# Patient Record
Sex: Female | Born: 1945 | Race: Black or African American | Hispanic: No | Marital: Single | Smoking: Never smoker
Health system: Southern US, Community
[De-identification: ages and names within clinical notes are randomized; demographics above are authoritative.]

## PROBLEM LIST (undated history)

## (undated) DIAGNOSIS — C9110 Chronic lymphocytic leukemia of B-cell type not having achieved remission: Secondary | ICD-10-CM

## (undated) DIAGNOSIS — I10 Essential (primary) hypertension: Secondary | ICD-10-CM

## (undated) DIAGNOSIS — C801 Malignant (primary) neoplasm, unspecified: Secondary | ICD-10-CM

## (undated) DIAGNOSIS — E119 Type 2 diabetes mellitus without complications: Secondary | ICD-10-CM

## (undated) HISTORY — DX: Chronic lymphocytic leukemia of B-cell type not having achieved remission: C91.10

---

## 2014-04-06 ENCOUNTER — Telehealth: Payer: Self-pay | Admitting: Hematology & Oncology

## 2014-04-06 NOTE — Telephone Encounter (Signed)
Spoke w NEW PATIENT today to remind them of their appointment with Dr. Ennever. Also, advised them to bring all medication bottles and insurance card information. ° °

## 2014-04-07 ENCOUNTER — Other Ambulatory Visit (HOSPITAL_COMMUNITY)
Admission: RE | Admit: 2014-04-07 | Discharge: 2014-04-07 | Disposition: A | Discharge status: Home / Self Care | Payer: Medicare Other | Source: Ambulatory Visit | Attending: Hematology & Oncology | Admitting: Hematology & Oncology

## 2014-04-07 ENCOUNTER — Encounter: Payer: Self-pay | Admitting: Hematology & Oncology

## 2014-04-07 ENCOUNTER — Ambulatory Visit (HOSPITAL_BASED_OUTPATIENT_CLINIC_OR_DEPARTMENT_OTHER): Payer: Medicare Other | Admitting: Hematology & Oncology

## 2014-04-07 ENCOUNTER — Other Ambulatory Visit (HOSPITAL_BASED_OUTPATIENT_CLINIC_OR_DEPARTMENT_OTHER): Payer: Medicare Other | Admitting: Lab

## 2014-04-07 ENCOUNTER — Ambulatory Visit: Payer: Medicare Other

## 2014-04-07 VITALS — BP 188/84 | HR 64 | Temp 98.1°F | Resp 16 | Ht 64.0 in | Wt 242.0 lb

## 2014-04-07 DIAGNOSIS — D72829 Elevated white blood cell count, unspecified: Secondary | ICD-10-CM

## 2014-04-07 DIAGNOSIS — C9110 Chronic lymphocytic leukemia of B-cell type not having achieved remission: Secondary | ICD-10-CM

## 2014-04-07 LAB — CBC WITH DIFFERENTIAL (CANCER CENTER ONLY)
BASO#: 0 10*3/uL (ref 0.0–0.2)
BASO%: 0 % (ref 0.0–2.0)
EOS ABS: 0.3 10*3/uL (ref 0.0–0.5)
EOS%: 0.7 % (ref 0.0–7.0)
HCT: 38.3 % (ref 34.8–46.6)
HGB: 12.5 g/dL (ref 11.6–15.9)
LYMPH#: 37.2 10*3/uL — ABNORMAL HIGH (ref 0.9–3.3)
LYMPH%: 83.1 % — ABNORMAL HIGH (ref 14.0–48.0)
MCH: 23 pg — ABNORMAL LOW (ref 26.0–34.0)
MCHC: 32.6 g/dL (ref 32.0–36.0)
MCV: 70 fL — AB (ref 81–101)
MONO#: 1.8 10*3/uL — AB (ref 0.1–0.9)
MONO%: 4 % (ref 0.0–13.0)
NEUT%: 12.2 % — ABNORMAL LOW (ref 39.6–80.0)
NEUTROS ABS: 5.4 10*3/uL (ref 1.5–6.5)
Platelets: 309 10*3/uL (ref 145–400)
RBC: 5.44 10*6/uL — ABNORMAL HIGH (ref 3.70–5.32)
RDW: 14.8 % (ref 11.1–15.7)
WBC: 44.7 10*3/uL — AB (ref 3.9–10.0)

## 2014-04-07 LAB — TECHNOLOGIST REVIEW CHCC SATELLITE

## 2014-04-07 LAB — CHCC SATELLITE - SMEAR

## 2014-04-07 NOTE — Progress Notes (Signed)
Referral MD  Reason for Referral: Leukocytosis-likely CLL   Chief Complaint  Patient presents with  . NEW PATIENT  : My white cells are high  HPI: Megan Tate is a very kind 68 year old Afro-American female. She is followed at the [facility].  She has diabetes. She's had diabetes for 4 years. She is not on insulin.  She does have some routine blood work done. She did we is due for this in September but her doctor wanted this done sooner. I know that this is how God works. Her white cell count was found to be 45,000. She had a hemoglobin of 11.5 and platelet count was 253,000. Her white cell differential was 10 segs and 8 lymphs. She had electrolytes which were premature unremarkable. Creatinine was 1.2. Calcium 10.6 with an albumin of 3.7.  She's noted some of palpable lymph glands in her neck. They are non-tender.  She's had no fever. She's had some sweats. She's had no weight loss weight gain. She's had no problems infections. She's had no change in bowel or bladder habits. She has never had a colonoscopy. She is not sure when her last mammogram was. I don't usually wants to have these done.  She comes in with her husband. They are both very nice.  She does not smoke. She does not drink. She is retired. She used to be a school bus driver.  Her husband is a Theme park manager.  Overall, her performance status is ECOG 1   No past medical history on file.:  No past surgical history on file.:  Current outpatient prescriptions:cloNIDine (CATAPRES) 0.2 MG tablet, Take 0.2 mg by mouth 2 (two) times daily. , Disp: , Rfl: ;  COLCRYS 0.6 MG tablet, Take 0.6 mg by mouth as needed. , Disp: , Rfl: ;  CRESTOR 10 MG tablet, Take 10 mg by mouth daily. , Disp: , Rfl: ;  dorzolamide-timolol (COSOPT) 22.3-6.8 MG/ML ophthalmic solution, Place 1 drop into both eyes 2 (two) times daily. , Disp: , Rfl:  EDARBI 80 MG TABS, Take by mouth every morning. , Disp: , Rfl: ;  glimepiride (AMARYL) 4 MG tablet, Take  4 mg by mouth daily with breakfast. , Disp: , Rfl: ;  KOMBIGLYZE XR 2.01-999 MG TB24, Take by mouth every morning. , Disp: , Rfl: ;  LEVEMIR FLEXTOUCH 100 UNIT/ML Pen, Inject 10 Units into the skin every morning. , Disp: , Rfl: ;  metoprolol succinate (TOPROL-XL) 100 MG 24 hr tablet, Take 100 mg by mouth daily. , Disp: , Rfl:  naproxen (NAPROSYN) 500 MG tablet, Take 500 mg by mouth 2 (two) times daily with a meal. , Disp: , Rfl: ;  spironolactone (ALDACTONE) 25 MG tablet, Take 25 mg by mouth once. , Disp: , Rfl: ;  sulfamethoxazole-trimethoprim (BACTRIM DS) 800-160 MG per tablet, Take 1 tablet by mouth 2 (two) times daily. , Disp: , Rfl: ;  Vitamin D, Ergocalciferol, (DRISDOL) 50000 UNITS CAPS capsule, Take 50,000 Units by mouth every 7 (seven) days., Disp: , Rfl: :  :  No Known Allergies:  No family history on file.:  History   Social History  . Marital Status: Unknown    Spouse Name: N/A    Number of Children: N/A  . Years of Education: N/A   Occupational History  . Not on file.   Social History Main Topics  . Smoking status: Never Smoker   . Smokeless tobacco: Never Used     Comment: never used tobacco  . Alcohol Use: Not  on file  . Drug Use: Not on file  . Sexual Activity: Not on file   Other Topics Concern  . Not on file   Social History Narrative  . No narrative on file  :  Pertinent items are noted in HPI.  Exam: '@IPVITALS' @  somewhat obese Afro-American female in no obvious distress. Vital signs show a temperature of 98.1. Pulse 64. Blood pressure 180/84. Weight 242 pounds. Head and neck exam shows a normocephalic atraumatic skull . She does have palpable bilateral cervical lymph nodes in the posterior chain. She has palpable supraclavicular lymph nodes. These are nontender. The largest nodes palliative measure 2 cm. Lungs are clear. Cardiac exam regular in rhythm. Abdomen is soft. She has good bowel sounds. There is no fluid wave. There is no palpable liver or spleen  tip. Axillary exam shows bilateral axillary lymph nodes. The largest her in the left axilla measuring about 2-3 cm. Back exam shows no tenderness over the spine ribs or hips. Extremities shows no clubbing cyanosis or edema. She has good range of motion of her joints. She is decent strength in her legs. Skin exam no rashes. Neurological exam is non-focal.   Recent Labs  04/07/14 1437  WBC 44.7*  HGB 12.5  HCT 38.3  PLT 309   No results found for this basename: NA, K, CL, CO2, GLUCOSE, BUN, CREATININE, CALCIUM,  in the last 72 hours  Blood smear review: Normochromic and normocytic red blood cells. She may have a couple target cells. She has no nucleated red cells. There is no rouleau formation. White cells are increased in number. Most are lymphocytes. She has several smudge cells. I see no blasts. She has no hypersegmented polys. I see a couple cleaved lymphocytes.  Pathology: No data     Assessment and Plan: Ms. Derocher is a 68 year old female with leukocytosis. I suspect that she has chronic lymphocytic leukemia. Her blood smear is very characteristic of this. She has lymphadenopathy. 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 Another possibility might be mantle cell lymphoma. Flow cytometry will help with this  I think that we probably will have to consider therapy for her because of the lymphadenopathy.  I did send off her blood for flow cytometry. I suppose that she may have lymphoma. We will see what the flow cytology shows.  I believe that a CT scan is indicated. We will get a scan from the neck down to the pelvis. I suspect that she has diffuse lymphadenopathy. She may have splenomegaly but she is somewhat obese so it is hard to palpate her spleen.  I also suspect that a bone marrow biopsy is going to be needed. I think this will also help guide [place] with therapy.  I will like to try to avoid a lymph node biopsy. We may have to think about this depending on what we  find with our flow cytometry and bone marrow biopsy.  I spent a good hour with she and her husband. I gave her a prayer blanket. We had an excellent prayer session after our meeting.  I laid down my recommendations. I explained to them what I thought she had. I am pretty confident that we are dealing with CLL.  Once we get results back from our studies, then we will get her back and throughout how we can treat this. With the bone marrow biopsy, cytogenetics and FISH will be critical.

## 2014-04-09 LAB — PROTEIN ELECTROPHORESIS, SERUM, WITH REFLEX
ALBUMIN ELP: 59.2 % (ref 55.8–66.1)
Alpha-1-Globulin: 4.1 % (ref 2.9–4.9)
Alpha-2-Globulin: 12.3 % — ABNORMAL HIGH (ref 7.1–11.8)
BETA 2: 3.9 % (ref 3.2–6.5)
BETA GLOBULIN: 6 % (ref 4.7–7.2)
GAMMA GLOBULIN: 14.5 % (ref 11.1–18.8)
Total Protein, Serum Electrophoresis: 6.6 g/dL (ref 6.0–8.3)

## 2014-04-09 LAB — COMPREHENSIVE METABOLIC PANEL
ALBUMIN: 4.3 g/dL (ref 3.5–5.2)
ALT: 9 U/L (ref 0–35)
AST: 16 U/L (ref 0–37)
Alkaline Phosphatase: 69 U/L (ref 39–117)
BUN: 18 mg/dL (ref 6–23)
CO2: 26 meq/L (ref 19–32)
Calcium: 11 mg/dL — ABNORMAL HIGH (ref 8.4–10.5)
Chloride: 103 mEq/L (ref 96–112)
Creatinine, Ser: 1.47 mg/dL — ABNORMAL HIGH (ref 0.50–1.10)
Glucose, Bld: 81 mg/dL (ref 70–99)
Potassium: 4.8 mEq/L (ref 3.5–5.3)
SODIUM: 134 meq/L — AB (ref 135–145)
TOTAL PROTEIN: 6.6 g/dL (ref 6.0–8.3)
Total Bilirubin: 0.3 mg/dL (ref 0.2–1.2)

## 2014-04-09 LAB — RETICULOCYTES (CHCC)
ABS RETIC: 65.4 10*3/uL (ref 19.0–186.0)
RBC.: 5.45 MIL/uL — AB (ref 3.87–5.11)
Retic Ct Pct: 1.2 % (ref 0.4–2.3)

## 2014-04-09 LAB — LACTATE DEHYDROGENASE: LDH: 205 U/L (ref 94–250)

## 2014-04-09 LAB — BETA 2 MICROGLOBULIN, SERUM: Beta-2 Microglobulin: 5.78 mg/L — ABNORMAL HIGH (ref <=2.51)

## 2014-04-09 LAB — DIRECT ANTIGLOBULIN TEST (NOT AT ARMC)
DAT (Complement): NEGATIVE
DAT IgG: NEGATIVE

## 2014-04-13 ENCOUNTER — Telehealth: Payer: Self-pay | Admitting: Hematology & Oncology

## 2014-04-13 ENCOUNTER — Telehealth: Payer: Self-pay | Admitting: *Deleted

## 2014-04-13 NOTE — Telephone Encounter (Signed)
Patient called and also stated to cx Bone Marrow on 04/16/14 due to being out of town.  She stated she would call back to resch both apts.  I  Informed Roxanne Gates of patients statement.  I left Bone marrow 04/16/14 apt due to Md returning back to office tomorrow.

## 2014-04-13 NOTE — Telephone Encounter (Signed)
Patient called and cx 04/14/14 CT apts due to being out of town.  I called Hoyle Sauer in Imaging and cx CT apts and informed the Roxanne Gates of the situation.

## 2014-04-13 NOTE — Telephone Encounter (Signed)
Called to get more information on why the pt cancelled her appointments. No answer. Gave pt a number to call the office back.

## 2014-04-14 ENCOUNTER — Telehealth: Payer: Self-pay | Admitting: Hematology & Oncology

## 2014-04-14 ENCOUNTER — Ambulatory Visit (HOSPITAL_BASED_OUTPATIENT_CLINIC_OR_DEPARTMENT_OTHER): Payer: Medicare Other

## 2014-04-14 LAB — FLOW CYTOMETRY - CHCC SATELLITE

## 2014-04-14 NOTE — Telephone Encounter (Signed)
Left pt message to call and confirm 7-17 BMBX being cx. Scheduling is aware

## 2014-04-15 ENCOUNTER — Telehealth: Payer: Self-pay | Admitting: Hematology & Oncology

## 2014-04-15 ENCOUNTER — Encounter: Payer: Self-pay | Admitting: *Deleted

## 2014-04-15 ENCOUNTER — Other Ambulatory Visit: Payer: Self-pay | Admitting: Hematology & Oncology

## 2014-04-15 DIAGNOSIS — C9110 Chronic lymphocytic leukemia of B-cell type not having achieved remission: Secondary | ICD-10-CM

## 2014-04-15 NOTE — Progress Notes (Signed)
MD aware that pt has cancelled appts and not reuturned calls.

## 2014-04-15 NOTE — Telephone Encounter (Signed)
I left a telephone message for Megan Tate that I was told she canceled her scans in her bone marrow test. I told her that if she did not want to do anything bad I understood this and that this was her prerogative to do this. However, I told that I know that she has chronic lymphocytic leukemia him. This was confirmed by the flow cytometry.  I told her that I really would want to follow her at least with blood work so we can see how her CLL is progressing. Even if she does not do any x-ray test her bone marrow test, we can follow along with just simple lab work to prevent her from ending up in the hospital and then having a lot of complications.  I told her that we can see her after Labor Day and that we would not bother her summer but that we really should see her so that weekend do the proper lab studies.  I told her that we will call her to make an appointment and hopefully she will continue to see [place].  Laurey Arrow

## 2014-04-16 ENCOUNTER — Inpatient Hospital Stay (HOSPITAL_COMMUNITY): Admission: RE | Admit: 2014-04-16 | Payer: Medicare Other | Source: Ambulatory Visit

## 2014-04-16 ENCOUNTER — Telehealth: Payer: Self-pay | Admitting: Hematology & Oncology

## 2014-04-16 ENCOUNTER — Ambulatory Visit (HOSPITAL_COMMUNITY)
Admission: RE | Admit: 2014-04-16 | Discharge: 2014-04-16 | Disposition: A | Discharge status: Home / Self Care | Payer: Medicare Other | Source: Ambulatory Visit | Attending: Hematology & Oncology | Admitting: Hematology & Oncology

## 2014-04-16 NOTE — Telephone Encounter (Signed)
Left pt message with 9-16 appointment

## 2014-04-19 ENCOUNTER — Telehealth: Payer: Self-pay | Admitting: *Deleted

## 2014-04-19 ENCOUNTER — Telehealth: Payer: Self-pay | Admitting: Hematology & Oncology

## 2014-04-19 NOTE — Telephone Encounter (Signed)
Informed pt that the Dr left her a voicemail stating she has Chronic Leukemia and he wants her to come in for followup labs.

## 2014-04-19 NOTE — Telephone Encounter (Signed)
Per Roxanne Gates and MD to sch patient for lab/NP this week.  Apt was sch for 04/22/14.  I called patient and gave her apt date/time.  Patient is aware of apt

## 2014-04-21 ENCOUNTER — Encounter: Payer: Self-pay | Admitting: Hematology & Oncology

## 2014-04-22 ENCOUNTER — Encounter: Payer: Medicare Other | Admitting: Family

## 2014-04-22 ENCOUNTER — Other Ambulatory Visit (HOSPITAL_BASED_OUTPATIENT_CLINIC_OR_DEPARTMENT_OTHER): Payer: Medicare Other | Admitting: Lab

## 2014-04-22 ENCOUNTER — Encounter: Payer: Self-pay | Admitting: Family

## 2014-04-22 VITALS — BP 158/65 | HR 75 | Temp 98.2°F | Resp 14 | Ht 64.0 in | Wt 243.0 lb

## 2014-04-22 DIAGNOSIS — I10 Essential (primary) hypertension: Secondary | ICD-10-CM

## 2014-04-22 DIAGNOSIS — C9110 Chronic lymphocytic leukemia of B-cell type not having achieved remission: Secondary | ICD-10-CM

## 2014-04-22 LAB — CBC WITH DIFFERENTIAL (CANCER CENTER ONLY)
BASO#: 0.1 10*3/uL (ref 0.0–0.2)
BASO%: 0.1 % (ref 0.0–2.0)
EOS%: 0.9 % (ref 0.0–7.0)
Eosinophils Absolute: 0.4 10*3/uL (ref 0.0–0.5)
HCT: 37.5 % (ref 34.8–46.6)
HGB: 12 g/dL (ref 11.6–15.9)
LYMPH#: 38.7 10*3/uL — AB (ref 0.9–3.3)
LYMPH%: 86.3 % — ABNORMAL HIGH (ref 14.0–48.0)
MCH: 22.9 pg — AB (ref 26.0–34.0)
MCHC: 32 g/dL (ref 32.0–36.0)
MCV: 71 fL — ABNORMAL LOW (ref 81–101)
MONO#: 1.6 10*3/uL — ABNORMAL HIGH (ref 0.1–0.9)
MONO%: 3.6 % (ref 0.0–13.0)
NEUT%: 9.1 % — ABNORMAL LOW (ref 39.6–80.0)
NEUTROS ABS: 4.1 10*3/uL (ref 1.5–6.5)
PLATELETS: 204 10*3/uL (ref 145–400)
RBC: 5.25 10*6/uL (ref 3.70–5.32)
RDW: 14.8 % (ref 11.1–15.7)
WBC: 44.9 10*3/uL — ABNORMAL HIGH (ref 3.9–10.0)

## 2014-04-22 LAB — TECHNOLOGIST REVIEW CHCC SATELLITE

## 2014-04-22 LAB — CHCC SATELLITE - SMEAR

## 2014-04-22 MED ORDER — SPIRONOLACTONE 25 MG PO TABS: 25.0000 mg | ORAL_TABLET | Freq: Once | ORAL | Status: DC

## 2014-04-22 NOTE — Progress Notes (Signed)
This encounter was created in error - please disregard.

## 2014-04-23 NOTE — Addendum Note (Signed)
Addended by: Volanda Napoleon on: 04/23/2014 06:52 AM   Modules accepted: Level of Service

## 2014-04-23 NOTE — Progress Notes (Signed)
Hematology and Oncology Follow Up Visit  Megan Tate [id number] [birth year] 68 y.o. 04/23/2014   Principle Diagnosis:   Chronic lymphocytic leukemia-stage B  Current Therapy:    Observation     Interim History:  Ms.  Megan Tate is back for a followup. We first saw her 3 weeks ago. She was therefore scans. She had a cervical lymphadenopathy. She needed to have a bone marrow test done. However, she canceled all these.  I call her when her know what was going on.  The flow cytometry that we did confirm that she has chronic lymphocytic leukemia. I told her that we really need to talk to her about what her options were. She is quite apprehensive. Thanks for, she is a very strong faith. Her husband is a Company secretary.  She has diabetes. I think the diabetes will be a much bigger problem long term than the CLL.  She's had no pain with the lymphadenopathy. She's not noted the lymph nodes to be growing.  She, unfortunately, has a daughter who has recurrent brain cancer. They brought her home from [place]. She now is going to [facility].  She's had no fever. She's had no nausea vomiting. She's had no abdominal pain.  When we first saw her, her beta-2 microglobulin was 5.78. I think this is a good indicator for the burden of disease at that she has. She did not have a monoclonal spike in her serum. She was negative for Coombs assay.  Her appetite has been okay.  Overall, her performance status is ECOG 1.  Medications: Current outpatient prescriptions:cloNIDine (CATAPRES) 0.2 MG tablet, Take 0.2 mg by mouth 2 (two) times daily. , Disp: , Rfl: ;  CRESTOR 10 MG tablet, Take 10 mg by mouth daily. , Disp: , Rfl: ;  dorzolamide-timolol (COSOPT) 22.3-6.8 MG/ML ophthalmic solution, Place 1 drop into both eyes 2 (two) times daily. , Disp: , Rfl: ;  EDARBI 80 MG TABS, Take by mouth every morning. , Disp: , Rfl:  glimepiride (AMARYL) 4 MG tablet, Take 4 mg by mouth daily with breakfast. , Disp: , Rfl: ;   KOMBIGLYZE XR 2.01-999 MG TB24, Take by mouth every morning. , Disp: , Rfl: ;  LEVEMIR FLEXTOUCH 100 UNIT/ML Pen, Inject 10 Units into the skin every morning. , Disp: , Rfl: ;  metoprolol succinate (TOPROL-XL) 100 MG 24 hr tablet, Take 100 mg by mouth daily. , Disp: , Rfl:  spironolactone (ALDACTONE) 25 MG tablet, Take 1 tablet (25 mg total) by mouth once., Disp: 90 tablet, Rfl: 3;  Vitamin D, Ergocalciferol, (DRISDOL) 50000 UNITS CAPS capsule, Take 50,000 Units by mouth every 7 (seven) days., Disp: , Rfl: ;  COLCRYS 0.6 MG tablet, Take 0.6 mg by mouth as needed. , Disp: , Rfl:   Allergies: No Known Allergies  Past Medical History, Surgical history, Social history, and Family History were reviewed and updated.  Review of Systems: As above  Physical Exam:  height is 5\' 4"  (1.626 m) and weight is 243 lb (110.224 kg). Her oral temperature is 98.2 F (36.8 C). Her blood pressure is 158/65 and her pulse is 75. Her respiration is 14.    mildly obese Afro-American female. Head and neck exam shows bilateral cervical lymphadenopathy. She has left supraclavicular lymphadenopathy. The lymph nodes are slightly mobile. They are fleshy in texture. There is no oral lesion. Lungs are clear. Cardiac exam regular rhythm. Abdomen is soft. Has good bowel sounds. There is no fluid wave. There is no palpable liver or  spleen tip. Axillary exam shows no obvious axillary adenopathy. Back exam no tenderness over the spine ribs or hips. Extremities shows no clubbing cyanosis or edema. She's the right portion of her joints. Skin exam no rashes, ecchymosis or petechia. Neurological exam shows no focal neurological deficits.  Lab Results  Component Value Date   WBC 44.9* 04/22/2014   HGB 12.0 04/22/2014   HCT 37.5 04/22/2014   MCV 71* 04/22/2014   PLT 204 04/22/2014     Chemistry      Component Value Date/Time   NA 134* 04/07/2014 1437   K 4.8 04/07/2014 1437   CL 103 04/07/2014 1437   CO2 26 04/07/2014 1437   BUN 18 04/07/2014  1437   CREATININE 1.47* 04/07/2014 1437      Component Value Date/Time   CALCIUM 11.0* 04/07/2014 1437   ALKPHOS 69 04/07/2014 1437   AST 16 04/07/2014 1437   ALT 9 04/07/2014 1437   BILITOT 0.3 04/07/2014 1437         Impression and Plan: Megan Tate is 68 year old African American female. She has CLL. By the lymphadenopathy, I would said she has stage B disease.  I told her and her husband for about 45 minutes. I explained to them what CLL is. I told them that she did not need therapy right now. However, I did feel that we need to get a CT scan to see what type of lymphadenopathy she has in size. She does agree to this.  I told them the criteria that I use to determine if we need to treat. I realize that she does have a daughter that is sick. We will keep this in mind.  We will plan to get her back in a couple months. Again, we can just follow her long without having to do any therapy as long as she is not symptomatic in that her lymph nodes do not grow., and that she does not have changes with her hemoglobin or platelet count.   Volanda Napoleon, MD 7/24/20156:44 AM

## 2014-04-28 ENCOUNTER — Ambulatory Visit (HOSPITAL_BASED_OUTPATIENT_CLINIC_OR_DEPARTMENT_OTHER)
Admission: RE | Admit: 2014-04-28 | Discharge: 2014-04-28 | Disposition: A | Discharge status: Home / Self Care | Payer: Medicare Other | Source: Ambulatory Visit | Attending: Hematology & Oncology | Admitting: Hematology & Oncology

## 2014-04-28 ENCOUNTER — Encounter (HOSPITAL_BASED_OUTPATIENT_CLINIC_OR_DEPARTMENT_OTHER): Payer: Self-pay

## 2014-04-28 DIAGNOSIS — M47814 Spondylosis without myelopathy or radiculopathy, thoracic region: Secondary | ICD-10-CM | POA: Insufficient documentation

## 2014-04-28 DIAGNOSIS — E041 Nontoxic single thyroid nodule: Secondary | ICD-10-CM | POA: Diagnosis not present

## 2014-04-28 DIAGNOSIS — R599 Enlarged lymph nodes, unspecified: Secondary | ICD-10-CM | POA: Diagnosis not present

## 2014-04-28 DIAGNOSIS — C9110 Chronic lymphocytic leukemia of B-cell type not having achieved remission: Secondary | ICD-10-CM

## 2014-04-28 DIAGNOSIS — I2510 Atherosclerotic heart disease of native coronary artery without angina pectoris: Secondary | ICD-10-CM | POA: Diagnosis not present

## 2014-04-28 HISTORY — DX: Malignant (primary) neoplasm, unspecified: C80.1

## 2014-04-28 HISTORY — DX: Type 2 diabetes mellitus without complications: E11.9

## 2014-04-28 HISTORY — DX: Essential (primary) hypertension: I10

## 2014-04-28 MED ORDER — IOHEXOL 300 MG/ML  SOLN
80.0000 mL | Freq: Once | INTRAMUSCULAR | Status: AC | PRN
  Administered 2014-04-28: 80 mL via INTRAVENOUS

## 2014-05-04 ENCOUNTER — Telehealth: Payer: Self-pay | Admitting: *Deleted

## 2014-05-04 NOTE — Telephone Encounter (Addendum)
Message copied by Lenn Sink on Tue May 04, 2014  9:05 AM ------      Message from: Volanda Napoleon      Created: Sun May 02, 2014  8:37 PM       Call - there are enlarged lymph nodes, but nothing that is serious.  Spleen is normal in size!!  pete ------Informed pt that CT showed enlarged lymph nodes, but nothing that is serious. Spleen is normal in size

## 2014-06-16 ENCOUNTER — Encounter: Payer: Self-pay | Admitting: Hematology & Oncology

## 2014-06-16 ENCOUNTER — Other Ambulatory Visit (HOSPITAL_BASED_OUTPATIENT_CLINIC_OR_DEPARTMENT_OTHER): Payer: Medicare Other | Admitting: Lab

## 2014-06-16 ENCOUNTER — Ambulatory Visit (HOSPITAL_BASED_OUTPATIENT_CLINIC_OR_DEPARTMENT_OTHER): Payer: Medicare Other | Admitting: Hematology & Oncology

## 2014-06-16 VITALS — BP 189/77 | HR 65 | Temp 98.2°F | Resp 16 | Ht 64.0 in | Wt 243.0 lb

## 2014-06-16 DIAGNOSIS — C9110 Chronic lymphocytic leukemia of B-cell type not having achieved remission: Secondary | ICD-10-CM

## 2014-06-16 LAB — CBC WITH DIFFERENTIAL (CANCER CENTER ONLY)
BASO#: 0.1 10*3/uL (ref 0.0–0.2)
BASO%: 0.1 % (ref 0.0–2.0)
EOS%: 0.5 % (ref 0.0–7.0)
Eosinophils Absolute: 0.3 10*3/uL (ref 0.0–0.5)
HCT: 37.1 % (ref 34.8–46.6)
HEMOGLOBIN: 11.8 g/dL (ref 11.6–15.9)
LYMPH#: 53.2 10*3/uL — AB (ref 0.9–3.3)
LYMPH%: 88.9 % — ABNORMAL HIGH (ref 14.0–48.0)
MCH: 23.1 pg — ABNORMAL LOW (ref 26.0–34.0)
MCHC: 31.8 g/dL — AB (ref 32.0–36.0)
MCV: 73 fL — ABNORMAL LOW (ref 81–101)
MONO#: 2.7 10*3/uL — AB (ref 0.1–0.9)
MONO%: 4.5 % (ref 0.0–13.0)
NEUT%: 6 % — ABNORMAL LOW (ref 39.6–80.0)
NEUTROS ABS: 3.5 10*3/uL (ref 1.5–6.5)
Platelets: 214 10*3/uL (ref 145–400)
RBC: 5.1 10*6/uL (ref 3.70–5.32)
RDW: 14.8 % (ref 11.1–15.7)
WBC: 59.8 10*3/uL — AB (ref 3.9–10.0)

## 2014-06-16 LAB — CHCC SATELLITE - SMEAR

## 2014-06-16 LAB — TECHNOLOGIST REVIEW CHCC SATELLITE

## 2014-06-17 NOTE — Progress Notes (Signed)
Hematology and Oncology Follow Up Visit  Megan Tate [id number] 15-Oct-1945 68 y.o. 06/17/2014   Principle Diagnosis:  CLL-stage C Current Therapy:    Observation     Interim History:  [name] is back for followup. We'll first saw her back in July, she really was not interested in taking any therapy. She felt well. She actually did not want to do any tests. We were able to talk to her and try to get her to understand why we had to do some tests.  We did go ahead and do a CT scan on her. This, no surprise, showed lymphadenopathy. Her spleen was normal in size. There is no organ involvement that I can see by the CLL.  The flow cytometry on her peripheral blood was consistent with CLL.  She has not noted any lymph node growing. She does have some lymphadenopathy or neck. However, this has not bothered her.  She's had a good appetite. There is no nausea or vomiting. She's had no change in bowel or bladder habits. She's had no rashes. She's had no fever. There's been no bleeding. She's had no weakness.  Her daughter is having a hard time undergoing treatment right now. Her daughter is at [facility]. I think she has sarcoma.  She says that her blood sugars are doing okay.  Currently, her performance status is ECOG 1.  Medications: Current outpatient prescriptions:cloNIDine (CATAPRES) 0.2 MG tablet, Take 0.2 mg by mouth 2 (two) times daily. , Disp: , Rfl: ;  COLCRYS 0.6 MG tablet, Take 0.6 mg by mouth as needed. , Disp: , Rfl: ;  CRESTOR 10 MG tablet, Take 10 mg by mouth daily. , Disp: , Rfl: ;  dorzolamide-timolol (COSOPT) 22.3-6.8 MG/ML ophthalmic solution, Place 1 drop into both eyes 2 (two) times daily. , Disp: , Rfl:  EDARBI 80 MG TABS, Take by mouth every morning. , Disp: , Rfl: ;  glimepiride (AMARYL) 4 MG tablet, Take 4 mg by mouth daily with breakfast. , Disp: , Rfl: ;  KOMBIGLYZE XR 2.01-999 MG TB24, Take by mouth every morning. , Disp: , Rfl: ;  LEVEMIR FLEXTOUCH 100  UNIT/ML Pen, Inject 10 Units into the skin every morning. , Disp: , Rfl: ;  metoprolol succinate (TOPROL-XL) 100 MG 24 hr tablet, Take 100 mg by mouth daily. , Disp: , Rfl:  spironolactone (ALDACTONE) 25 MG tablet, Take 1 tablet (25 mg total) by mouth once., Disp: 90 tablet, Rfl: 3;  Vitamin D, Ergocalciferol, (DRISDOL) 50000 UNITS CAPS capsule, Take 50,000 Units by mouth every 7 (seven) days., Disp: , Rfl:   Allergies: No Known Allergies  Past Medical History, Surgical history, Social history, and Family History were reviewed and updated.  Review of Systems: As above  Physical Exam:  height is 5\' 4"  (1.626 m) and weight is 243 lb (110.224 kg). Her oral temperature is 98.2 F (36.8 C). Her blood pressure is 189/77 and her pulse is 65. Her respiration is 16.   Well-developed well-nourished African [place] female. Head and neck exam shows lymphadenopathy on neck. She has bilateral cervical lymph nodes. She has a 2 cm left supraclavicular lymph node. Lymph nodes are mobile. They're slightly firm. They're non-tender. There is no ocular or oral lesions. Lungs are clear. Cardiac exam regular rate rhythm with no murmurs rubs or bruits. Abdomen is soft. Has good bowel sounds. There is no fluid wave. There is no palpable liver or spleen tip. Axillary exam shows bilateral axillary lymph nodes. She has no  tenderness to axillary node palpation. Back exam no tenderness over the spine, ribs or hips. Extremities shows no clubbing, cyanosis or edema. She has good strength. Has good range of motion of her joints. Skin exam no rashes, ecchymosis or petechia.  Lab Results  Component Value Date   WBC 59.8* 06/16/2014   HGB 11.8 06/16/2014   HCT 37.1 06/16/2014   MCV 73* 06/16/2014   PLT 214 06/16/2014     Chemistry      Component Value Date/Time   NA 138 06/16/2014 1102   K 4.6 06/16/2014 1102   CL 104 06/16/2014 1102   CO2 28 06/16/2014 1102   BUN 17 06/16/2014 1102   CREATININE 1.22* 06/16/2014 1102        Component Value Date/Time   CALCIUM 10.3 06/16/2014 1102   ALKPHOS 78 06/16/2014 1102   AST 15 06/16/2014 1102   ALT 14 06/16/2014 1102   BILITOT 0.4 06/16/2014 1102         Impression and Plan: Megan Tate is 68 year old female with CLL. She has stage C disease. She has lymphadenopathy. Her white cell count is going up.  She just does not want to be treated. She still feels good. We will likely need to be started on treatment with in 4-6 months.  We will get her back to see [place] in another 2 months.  Am glad that the CT scan did not show anything unusual. This certainly is encouraging.  I'll have to see about sending off her peripheral blood for cytogenetics. Hopefully I can do that when she comes back.  I spent a good half hour with her today. I explained to her what was going on. I went over her labs and her CAT scans.  I looked at her peripheral blood smear.    Volanda Napoleon, MD 9/17/20157:06 AM

## 2014-06-18 LAB — COMPREHENSIVE METABOLIC PANEL
ALBUMIN: 4 g/dL (ref 3.5–5.2)
ALT: 14 U/L (ref 0–35)
AST: 15 U/L (ref 0–37)
Alkaline Phosphatase: 78 U/L (ref 39–117)
BUN: 17 mg/dL (ref 6–23)
CO2: 28 mEq/L (ref 19–32)
Calcium: 10.3 mg/dL (ref 8.4–10.5)
Chloride: 104 mEq/L (ref 96–112)
Creatinine, Ser: 1.22 mg/dL — ABNORMAL HIGH (ref 0.50–1.10)
Glucose, Bld: 133 mg/dL — ABNORMAL HIGH (ref 70–99)
POTASSIUM: 4.6 meq/L (ref 3.5–5.3)
SODIUM: 138 meq/L (ref 135–145)
TOTAL PROTEIN: 6.3 g/dL (ref 6.0–8.3)
Total Bilirubin: 0.4 mg/dL (ref 0.2–1.2)

## 2014-06-18 LAB — BETA 2 MICROGLOBULIN, SERUM: Beta-2 Microglobulin: 5.86 mg/L — ABNORMAL HIGH (ref <=2.51)

## 2014-06-18 LAB — LACTATE DEHYDROGENASE: LDH: 182 U/L (ref 94–250)

## 2014-08-16 ENCOUNTER — Other Ambulatory Visit (HOSPITAL_COMMUNITY)
Admission: RE | Admit: 2014-08-16 | Discharge: 2014-08-16 | Disposition: A | Discharge status: Home / Self Care | Payer: Medicare Other | Source: Ambulatory Visit | Attending: Hematology & Oncology | Admitting: Hematology & Oncology

## 2014-08-16 ENCOUNTER — Ambulatory Visit (HOSPITAL_BASED_OUTPATIENT_CLINIC_OR_DEPARTMENT_OTHER): Payer: Medicare Other | Admitting: Hematology & Oncology

## 2014-08-16 ENCOUNTER — Encounter: Payer: Self-pay | Admitting: Hematology & Oncology

## 2014-08-16 ENCOUNTER — Other Ambulatory Visit (HOSPITAL_BASED_OUTPATIENT_CLINIC_OR_DEPARTMENT_OTHER): Payer: Medicare Other | Admitting: Lab

## 2014-08-16 VITALS — BP 188/77 | HR 61 | Temp 97.6°F | Resp 16 | Ht 64.0 in | Wt 242.0 lb

## 2014-08-16 DIAGNOSIS — C9110 Chronic lymphocytic leukemia of B-cell type not having achieved remission: Secondary | ICD-10-CM | POA: Diagnosis present

## 2014-08-16 DIAGNOSIS — E119 Type 2 diabetes mellitus without complications: Secondary | ICD-10-CM

## 2014-08-16 DIAGNOSIS — R591 Generalized enlarged lymph nodes: Secondary | ICD-10-CM

## 2014-08-16 HISTORY — DX: Chronic lymphocytic leukemia of B-cell type not having achieved remission: C91.10

## 2014-08-16 LAB — CBC WITH DIFFERENTIAL (CANCER CENTER ONLY)
HCT: 36.9 % (ref 34.8–46.6)
HGB: 11.4 g/dL — ABNORMAL LOW (ref 11.6–15.9)
MCH: 22.6 pg — ABNORMAL LOW (ref 26.0–34.0)
MCHC: 30.9 g/dL — AB (ref 32.0–36.0)
MCV: 73 fL — AB (ref 81–101)
PLATELETS: 196 10*3/uL (ref 145–400)
RBC: 5.05 10*6/uL (ref 3.70–5.32)
RDW: 14.9 % (ref 11.1–15.7)
WBC: 115.9 10*3/uL (ref 3.9–10.0)

## 2014-08-16 LAB — CMP (CANCER CENTER ONLY)
ALK PHOS: 79 U/L (ref 26–84)
ALT: 20 U/L (ref 10–47)
AST: 20 U/L (ref 11–38)
Albumin: 3.6 g/dL (ref 3.3–5.5)
BUN, Bld: 13 mg/dL (ref 7–22)
CO2: 28 mEq/L (ref 18–33)
Calcium: 10.6 mg/dL — ABNORMAL HIGH (ref 8.0–10.3)
Chloride: 100 mEq/L (ref 98–108)
Creat: 1.1 mg/dl (ref 0.6–1.2)
GLUCOSE: 94 mg/dL (ref 73–118)
Potassium: 3.9 mEq/L (ref 3.3–4.7)
Sodium: 144 mEq/L (ref 128–145)
TOTAL PROTEIN: 6.9 g/dL (ref 6.4–8.1)
Total Bilirubin: 0.5 mg/dl (ref 0.20–1.60)

## 2014-08-16 LAB — MANUAL DIFFERENTIAL (CHCC SATELLITE)
ALC: 104.3 10*3/uL — AB (ref 0.6–2.2)
ANC (CHCC HP manual diff): 10.4 10*3/uL — ABNORMAL HIGH (ref 1.5–6.7)
LYMPH: 90 % — ABNORMAL HIGH (ref 14–48)
MONO: 1 % (ref 0–13)
PLT EST ~~LOC~~: ADEQUATE
SEG: 9 % — ABNORMAL LOW (ref 40–75)

## 2014-08-16 LAB — CHCC SATELLITE - SMEAR

## 2014-08-16 MED ORDER — SULFAMETHOXAZOLE-TRIMETHOPRIM 800-160 MG PO TABS: 1.0000 | ORAL_TABLET | Freq: Every day | ORAL | Status: DC

## 2014-08-16 MED ORDER — FAMCICLOVIR 500 MG PO TABS: 500.0000 mg | ORAL_TABLET | Freq: Every day | ORAL | Status: DC

## 2014-08-16 MED ORDER — LIDOCAINE-PRILOCAINE 2.5-2.5 % EX CREA: TOPICAL_CREAM | CUTANEOUS | Status: DC

## 2014-08-16 MED ORDER — ALLOPURINOL 100 MG PO TABS: 100.0000 mg | ORAL_TABLET | Freq: Every day | ORAL | Status: DC

## 2014-08-16 NOTE — Progress Notes (Signed)
Hematology and Oncology Follow Up Visit  Ahni Bradwell [id number] Sep 03, 1946 68 y.o. 08/16/2014   Principle Diagnosis:   CLL-progressive  Current Therapy:    observation     Interim History:  Ms.  Scripter is back for follow-up. Unfortunately, the lymphadenopathy in her neck appears to be getting worse. She has a little bit more pain in the lymph nodes.She does feel a little bit more tired.  She's had no abdominal pain. Her appetite has been okay. There's been no change in bowel or bladder habits.  She does have diabetes. She is on insulin for this.  She's not had any rashes. She's had no pruritus. She's had no bleeding. There's been no headache.   Overall, her performance status is ECOG 1  Medications: Current outpatient prescriptions: cloNIDine (CATAPRES) 0.2 MG tablet, Take 0.2 mg by mouth 2 (two) times daily. , Disp: , Rfl: ;  COLCRYS 0.6 MG tablet, Take 0.6 mg by mouth as needed. , Disp: , Rfl: ;  CRESTOR 10 MG tablet, Take 10 mg by mouth daily. , Disp: , Rfl: ;  dorzolamide-timolol (COSOPT) 22.3-6.8 MG/ML ophthalmic solution, Place 1 drop into both eyes 2 (two) times daily. , Disp: , Rfl:  EDARBI 80 MG TABS, Take by mouth every morning. , Disp: , Rfl: ;  glimepiride (AMARYL) 4 MG tablet, Take 4 mg by mouth daily with breakfast. , Disp: , Rfl: ;  KOMBIGLYZE XR 2.01-999 MG TB24, Take by mouth every morning. , Disp: , Rfl: ;  LEVEMIR FLEXTOUCH 100 UNIT/ML Pen, Inject 10 Units into the skin every morning. , Disp: , Rfl: ;  metoprolol succinate (TOPROL-XL) 100 MG 24 hr tablet, Take 100 mg by mouth daily. , Disp: , Rfl:  spironolactone (ALDACTONE) 25 MG tablet, Take 1 tablet (25 mg total) by mouth once., Disp: 90 tablet, Rfl: 3;  Vitamin D, Ergocalciferol, (DRISDOL) 50000 UNITS CAPS capsule, Take 50,000 Units by mouth every 7 (seven) days., Disp: , Rfl: ;  allopurinol (ZYLOPRIM) 100 MG tablet, Take 1 tablet (100 mg total) by mouth daily., Disp: 30 tablet, Rfl: 0 famciclovir (FAMVIR) 500 MG  tablet, Take 1 tablet (500 mg total) by mouth daily., Disp: 30 tablet, Rfl: 8;  lidocaine-prilocaine (EMLA) cream, Apply to Port-A-Cath site 1 hour ride to treatment, Disp: 30 g, Rfl: 4;  sulfamethoxazole-trimethoprim (BACTRIM DS,SEPTRA DS) 800-160 MG per tablet, Take 1 tablet by mouth daily., Disp: 30 tablet, Rfl: 6  Allergies: No Known Allergies  Past Medical History, Surgical history, Social history, and Family History were reviewed and updated.  Review of Systems: As above  Physical Exam:  height is '5\' 4"'  (1.626 m) and weight is 242 lb (109.77 kg). Her oral temperature is 97.6 F (36.4 C). Her blood pressure is 188/77 and her pulse is 61. Her respiration is 16.   Moderately obese African-American female. Head and neck exam shows no ocular or oral lesions. She does have bilateral cervical and supraclavicular lymph nodes. These lymph nodes are slightly firm. There are nontender. There is slightly mobile. Lungs are clear. Cardiac exam regular rate andrhythm with no murmurs, rubs or bruits. Axillary exam shows bilateral axillary lymph nodes. Abdomen is soft. She is moderately obese. She has no guarding or rebound tenderness. She has no palpable liver or spleen tip. Back exam shows no tenderness over the spine, ribs or hips. Skin exam shows no rashes, ecchymoses or petechia. Neurological exam is nonfocal.  Lab Results  Component Value Date   WBC 115.9* 08/16/2014   HGB 11.4*  08/16/2014   HCT 36.9 08/16/2014   MCV 73* 08/16/2014   PLT 196 08/16/2014     Chemistry      Component Value Date/Time   NA 144 08/16/2014 0837   NA 138 06/16/2014 1102   K 3.9 08/16/2014 0837   K 4.6 06/16/2014 1102   CL 100 08/16/2014 0837   CL 104 06/16/2014 1102   CO2 28 08/16/2014 0837   CO2 28 06/16/2014 1102   BUN 13 08/16/2014 0837   BUN 17 06/16/2014 1102   CREATININE 1.1 08/16/2014 0837   CREATININE 1.22* 06/16/2014 1102      Component Value Date/Time   CALCIUM 10.6* 08/16/2014 0837   CALCIUM  10.3 06/16/2014 1102   ALKPHOS 79 08/16/2014 0837   ALKPHOS 78 06/16/2014 1102   AST 20 08/16/2014 0837   AST 15 06/16/2014 1102   ALT 20 08/16/2014 0837   ALT 14 06/16/2014 1102   BILITOT 0.50 08/16/2014 0837   BILITOT 0.4 06/16/2014 1102         Impression and Plan: Ms. Rosene is 68 year old African [place] female with CLL. Her white cell count clearly is climbing way to quickly. She is more symptomatic now. Her lymphadenopathy is becoming more of a problem.  I did send off her peripheral blood for flow cytometry and FISH studies for CLL. She really does not want have a bone marrow biopsy done.  I think we have to get started on treatment. I really thought that we would have more time. However, her white cell count definitely is "driving" our decisions.  Her husband was with her. We talked for about 45 minutes about treatment. I really think that we have to get started on treatment. She will need to have a Port-A-Cath in place.  I think she would do quite well with the accommodation of Gazyva/Treanda. I this would be a very active accommodation that she would tolerate well. I think success rate should be over 90%. Again, this might change depending on what her cytogenetics show.  I went over the treatment program. I explained to them how that we do this. I explained the side effects. I did give her prescriptions for Famvir, allopurinol, and Bactrim. I told her to start these next week.  I told her that treatment will make her more fatigued. I don't think it will affect her blood sugars but these certainly might be more elevated. She will have to be more diligent in checking her blood sugars.  We talked about the risk of infection. I suspect that her chemotherapy well knocked out her white cell count pretty quickly and that she might be at high risk for infection. This is why she is on the Famvir and the Bactrim.  She wants to get started after Thanksgiving. I don't think this will be  a problem.  I answered all their questions. She will go ahead and go to the chemotherapy teaching class.  I gave them if patient's sheets about each medicine.  I will plan to get her back about 2 weeks after treatment so we can see how her blood counts look at how she is doing..  I would like to think that we see her back, her cervical with adenopathy will be markedly improved.   Volanda Napoleon, MD 11/16/20156:08 PM

## 2014-08-18 ENCOUNTER — Encounter: Payer: Self-pay | Admitting: *Deleted

## 2014-08-18 ENCOUNTER — Other Ambulatory Visit: Payer: Medicare Other

## 2014-08-18 LAB — PROTEIN ELECTROPHORESIS, SERUM, WITH REFLEX
Albumin ELP: 58.5 % (ref 55.8–66.1)
Alpha-1-Globulin: 4.2 % (ref 2.9–4.9)
Alpha-2-Globulin: 11.4 % (ref 7.1–11.8)
Beta 2: 4.6 % (ref 3.2–6.5)
Beta Globulin: 5.9 % (ref 4.7–7.2)
Gamma Globulin: 15.4 % (ref 11.1–18.8)
TOTAL PROTEIN, SERUM ELECTROPHOR: 6.7 g/dL (ref 6.0–8.3)

## 2014-08-18 LAB — LACTATE DEHYDROGENASE: LDH: 215 U/L (ref 94–250)

## 2014-08-18 NOTE — Progress Notes (Signed)
On 11/17 called Dr. Antonieta Pert office in [facility].  Spoke with nurse Roselyn Reef, requested to check with Dr. Marin Olp on 11/18 to see if patient will be taking po [place] with her  [place].  Requested call back on 11/18 as patient is coming in for a treatment education class on 11/18.

## 2014-08-18 NOTE — Progress Notes (Signed)
Nurse, Roselyn Reef from Dr. Antonieta Pert office called this am to state that patient is not going to be on the [place].

## 2014-08-20 LAB — PERIPH BLD RTN CHROMOSOME - KARYOTYPE + FISH

## 2014-08-23 ENCOUNTER — Encounter: Payer: Self-pay | Admitting: Hematology & Oncology

## 2014-08-23 LAB — FISH, PERIPHERAL BLOOD

## 2014-08-23 LAB — CYTOGENETICS, PERIPHERAL BLOOD

## 2014-08-25 ENCOUNTER — Other Ambulatory Visit: Payer: Self-pay | Admitting: Radiology

## 2014-08-27 ENCOUNTER — Encounter: Payer: Self-pay | Admitting: Hematology & Oncology

## 2014-08-30 ENCOUNTER — Other Ambulatory Visit: Payer: Self-pay | Admitting: Hematology & Oncology

## 2014-08-30 ENCOUNTER — Ambulatory Visit (HOSPITAL_COMMUNITY)
Admission: RE | Admit: 2014-08-30 | Discharge: 2014-08-30 | Disposition: A | Discharge status: Home / Self Care | Payer: Medicare Other | Source: Ambulatory Visit | Attending: Hematology & Oncology | Admitting: Hematology & Oncology

## 2014-08-30 DIAGNOSIS — C9110 Chronic lymphocytic leukemia of B-cell type not having achieved remission: Secondary | ICD-10-CM

## 2014-08-30 DIAGNOSIS — E119 Type 2 diabetes mellitus without complications: Secondary | ICD-10-CM | POA: Diagnosis not present

## 2014-08-30 DIAGNOSIS — I10 Essential (primary) hypertension: Secondary | ICD-10-CM | POA: Diagnosis not present

## 2014-08-30 LAB — PROTIME-INR
INR: 1.09 (ref 0.00–1.49)
PROTHROMBIN TIME: 14.2 s (ref 11.6–15.2)

## 2014-08-30 LAB — CBC WITH DIFFERENTIAL/PLATELET
BASOS PCT: 0 % (ref 0–1)
Basophils Absolute: 0 10*3/uL (ref 0.0–0.1)
EOS PCT: 0 % (ref 0–5)
Eosinophils Absolute: 0 10*3/uL (ref 0.0–0.7)
HEMATOCRIT: 39.4 % (ref 36.0–46.0)
HEMOGLOBIN: 12.3 g/dL (ref 12.0–15.0)
Lymphocytes Relative: 93 % — ABNORMAL HIGH (ref 12–46)
Lymphs Abs: 105.7 10*3/uL — ABNORMAL HIGH (ref 0.7–4.0)
MCH: 22.8 pg — ABNORMAL LOW (ref 26.0–34.0)
MCHC: 31.2 g/dL (ref 30.0–36.0)
MCV: 73 fL — ABNORMAL LOW (ref 78.0–100.0)
Monocytes Absolute: 3.4 10*3/uL — ABNORMAL HIGH (ref 0.1–1.0)
Monocytes Relative: 3 % (ref 3–12)
NEUTROS ABS: 4.5 10*3/uL (ref 1.7–7.7)
NEUTROS PCT: 4 % — AB (ref 43–77)
Platelets: 199 10*3/uL (ref 150–400)
RBC: 5.4 MIL/uL — AB (ref 3.87–5.11)
RDW: 14.9 % (ref 11.5–15.5)
WBC: 113.6 10*3/uL (ref 4.0–10.5)

## 2014-08-30 LAB — GLUCOSE, CAPILLARY: Glucose-Capillary: 141 mg/dL — ABNORMAL HIGH (ref 70–99)

## 2014-08-30 MED ORDER — CEFAZOLIN SODIUM-DEXTROSE 2-3 GM-% IV SOLR
2.0000 g | Freq: Once | INTRAVENOUS | Status: AC
  Administered 2014-08-30: 2 g via INTRAVENOUS

## 2014-08-30 MED ORDER — LIDOCAINE HCL 1 % IJ SOLN
INTRAMUSCULAR | Status: AC
  Filled 2014-08-30: qty 20

## 2014-08-30 MED ORDER — MIDAZOLAM HCL 2 MG/2ML IJ SOLN
INTRAMUSCULAR | Status: AC
  Filled 2014-08-30: qty 4

## 2014-08-30 MED ORDER — HEPARIN SOD (PORK) LOCK FLUSH 100 UNIT/ML IV SOLN
INTRAVENOUS | Status: AC
  Filled 2014-08-30: qty 5

## 2014-08-30 MED ORDER — MIDAZOLAM HCL 2 MG/2ML IJ SOLN
INTRAMUSCULAR | Status: AC | PRN
  Administered 2014-08-30: 2 mg via INTRAVENOUS

## 2014-08-30 MED ORDER — HEPARIN SOD (PORK) LOCK FLUSH 100 UNIT/ML IV SOLN
500.0000 [IU] | Freq: Once | INTRAVENOUS | Status: AC
  Administered 2014-08-30: 500 [IU] via INTRAVENOUS

## 2014-08-30 MED ORDER — FENTANYL CITRATE 0.05 MG/ML IJ SOLN
INTRAMUSCULAR | Status: AC
  Filled 2014-08-30: qty 4

## 2014-08-30 MED ORDER — FENTANYL CITRATE 0.05 MG/ML IJ SOLN
INTRAMUSCULAR | Status: AC | PRN
  Administered 2014-08-30: 100 ug via INTRAVENOUS

## 2014-08-30 MED ORDER — SODIUM CHLORIDE 0.9 % IV SOLN
INTRAVENOUS | Status: DC
  Administered 2014-08-30: 500 mL via INTRAVENOUS

## 2014-08-30 MED ORDER — CEFAZOLIN SODIUM-DEXTROSE 2-3 GM-% IV SOLR
INTRAVENOUS | Status: AC
  Filled 2014-08-30: qty 50

## 2014-08-30 NOTE — Progress Notes (Signed)
CRITICAL VALUE ALERT  Critical value received: WBC 113.6  Date of notification:  08/30/14  Time of notification: 1033  Critical value read back:Yes.    Nurse who received alert:  Beatris Si RN  MD notified (1st page):Pam Aguas Buenas PA radiology was in Short Stay and I verbally informed her of this critical value  Time of first page: 1033 MD notified (2nd page):  Time of second page:  Responding MD:see above Time MD responded:  Jannifer Franklin PA was notified of this result

## 2014-08-30 NOTE — H&P (Signed)
Chief Complaint: Chronic lymphocytic leukemia Worsening white count Enlarging cervical LAN  Referring Physician(s): Ennever,Peter R  History of Present Illness: Megan Tate is a 68 y.o. female  CLL  Very high wbc: 113 today Enlarging cervical lymphadenopathy Scheduled to start chemotherapy Wed 12/2 Scheduled for port a cath placement in IR today   Past Medical History  Diagnosis Date  . Diabetes mellitus without complication   . Cancer CLL  . Hypertension   . CLL (chronic lymphocytic leukemia) 08/16/2014    No past surgical history on file.  Allergies: Tylenol  Medications: Prior to Admission medications   Medication Sig Start Date End Date Taking? Authorizing Provider  allopurinol (ZYLOPRIM) 100 MG tablet Take 1 tablet (100 mg total) by mouth daily. 08/16/14  Yes Volanda Napoleon, MD  cloNIDine (CATAPRES) 0.2 MG tablet Take 0.2 mg by mouth 2 (two) times daily.  02/04/14  Yes Historical Provider, MD  COLCRYS 0.6 MG tablet Take 0.6 mg by mouth as needed.  02/04/14  Yes Historical Provider, MD  CRESTOR 10 MG tablet Take 10 mg by mouth daily.  02/04/14  Yes Historical Provider, MD  dorzolamide-timolol (COSOPT) 22.3-6.8 MG/ML ophthalmic solution Place 1 drop into both eyes 2 (two) times daily.  03/18/14  Yes Historical Provider, MD  EDARBI 80 MG TABS Take by mouth every morning.  02/05/14  Yes Historical Provider, MD  famciclovir (FAMVIR) 500 MG tablet Take 1 tablet (500 mg total) by mouth daily. 08/16/14  Yes Volanda Napoleon, MD  KOMBIGLYZE XR 2.01-999 MG TB24 Take by mouth every morning.  02/16/14  Yes Historical Provider, MD  LEVEMIR FLEXTOUCH 100 UNIT/ML Pen Inject 10 Units into the skin every morning.  03/10/14  Yes Historical Provider, MD  metoprolol succinate (TOPROL-XL) 100 MG 24 hr tablet Take 100 mg by mouth daily.  03/10/14  Yes Historical Provider, MD  spironolactone (ALDACTONE) 25 MG tablet Take 1 tablet (25 mg total) by mouth once. 04/22/14  Yes Volanda Napoleon, MD    sulfamethoxazole-trimethoprim (BACTRIM DS,SEPTRA DS) 800-160 MG per tablet Take 1 tablet by mouth daily. 08/16/14  Yes Volanda Napoleon, MD  Vitamin D, Ergocalciferol, (DRISDOL) 50000 UNITS CAPS capsule Take 50,000 Units by mouth every 7 (seven) days. Every thursday   Yes Historical Provider, MD  lidocaine-prilocaine (EMLA) cream Apply to Port-A-Cath site 1 hour ride to treatment 08/16/14   Volanda Napoleon, MD    No family history on file.  History   Social History  . Marital Status: Single    Spouse Name: N/A    Number of Children: N/A  . Years of Education: N/A   Social History Main Topics  . Smoking status: Never Smoker   . Smokeless tobacco: Never Used     Comment: never used tobacco  . Alcohol Use: Not on file  . Drug Use: Not on file  . Sexual Activity: Not on file   Other Topics Concern  . Not on file   Social History Narrative      Review of Systems: A 12 point ROS discussed and pertinent positives are indicated in the HPI above.  All other systems are negative.  Review of Systems  Constitutional: Positive for fatigue. Negative for activity change and unexpected weight change.  Respiratory: Negative for cough, choking and shortness of breath.   Cardiovascular: Negative for chest pain.  Gastrointestinal: Negative for abdominal pain.  Musculoskeletal: Negative for back pain.  Neurological: Negative for weakness.  Psychiatric/Behavioral: Negative for behavioral problems and confusion.  Vital Signs: BP 188/76 mmHg  Pulse 78  Temp(Src) 98.8 F (37.1 C) (Oral)  Resp 16  Ht 5\' 4"  (1.626 m)  Wt 109.77 kg (242 lb)  BMI 41.52 kg/m2  SpO2 100%  Physical Exam  Constitutional: She is oriented to person, place, and time. She appears well-nourished.  Cardiovascular: Normal rate, regular rhythm and normal heart sounds.   No murmur heard. Pulmonary/Chest: Effort normal and breath sounds normal. She has no wheezes.  Abdominal: Soft. Bowel sounds are normal. There  is no tenderness.  Musculoskeletal: Normal range of motion.  Neurological: She is alert and oriented to person, place, and time.  Skin: Skin is warm and dry.  Psychiatric: She has a normal mood and affect. Her behavior is normal. Judgment and thought content normal.  Nursing note and vitals reviewed.   Imaging: No results found.  Labs:  CBC:  Recent Labs  04/22/14 1349 06/16/14 1102 08/16/14 0837 08/30/14 1013  WBC 44.9* 59.8* 115.9* 113.6*  HGB 12.0 11.8 11.4* 12.3  HCT 37.5 37.1 36.9 39.4  PLT 204 214 196 199    COAGS:  Recent Labs  08/30/14 1013  INR 1.09    BMP:  Recent Labs  04/07/14 1437 06/16/14 1102 08/16/14 0837  NA 134* 138 144  K 4.8 4.6 3.9  CL 103 104 100  CO2 26 28 28   GLUCOSE 81 133* 94  BUN 18 17 13   CALCIUM 11.0* 10.3 10.6*  CREATININE 1.47* 1.22* 1.1    LIVER FUNCTION TESTS:  Recent Labs  04/07/14 1437 06/16/14 1102 08/16/14 0837  BILITOT 0.3 0.4 0.50  AST 16 15 20   ALT 9 14 20   ALKPHOS 69 78 79  PROT 6.6 6.3 6.9  ALBUMIN 4.3 4.0  --     TUMOR MARKERS: No results for input(s): AFPTM, CEA, CA199, CHROMGRNA in the last 8760 hours.  Assessment and Plan:  CLL Enlarging cervical LAN Worsening wbc Wbc 113 today Scheduled to start chemo therapy Wed Now scheduled for [facility] access today Pt aware of procedure benefits and risks and agreeable to proceed Consent signed and in chart  Thank you for this interesting consult.  I greatly enjoyed meeting Audrea Bolte and look forward to participating in their care.     I spent a total of 20 minutes face to face in clinical consultation, greater than 50% of which was counseling/coordinating care for [facility] placement  Signed: Mililani Murthy A 08/30/2014, 11:06 AM

## 2014-08-30 NOTE — Discharge Instructions (Signed)
Implanted Port Home Guide °An implanted port is a type of central line that is placed under the skin. Central lines are used to provide IV access when treatment or nutrition needs to be given through a person's veins. Implanted ports are used for long-term IV access. An implanted port may be placed because:  °· You need IV medicine that would be irritating to the small veins in your hands or arms.   °· You need long-term IV medicines, such as antibiotics.   °· You need IV nutrition for a long period.   °· You need frequent blood draws for lab tests.   °· You need dialysis.   °Implanted ports are usually placed in the chest area, but they can also be placed in the upper arm, the abdomen, or the leg. An implanted port has two main parts:  °· Reservoir. The reservoir is round and will appear as a small, raised area under your skin. The reservoir is the part where a needle is inserted to give medicines or draw blood.   °· Catheter. The catheter is a thin, flexible tube that extends from the reservoir. The catheter is placed into a large vein. Medicine that is inserted into the reservoir goes into the catheter and then into the vein.   °HOW WILL I CARE FOR MY INCISION SITE? °Do not get the incision site wet. Bathe or shower as directed by your health care provider.  °HOW IS MY PORT ACCESSED? °Special steps must be taken to access the port:  °· Before the port is accessed, a numbing cream can be placed on the skin. This helps numb the skin over the port site.   °· Your health care provider uses a sterile technique to access the port. °· Your health care provider must put on a mask and sterile gloves. °· The skin over your port is cleaned carefully with an antiseptic and allowed to dry. °· The port is gently pinched between sterile gloves, and a needle is inserted into the port. °· Only "non-coring" port needles should be used to access the port. Once the port is accessed, a blood return should be checked. This helps  ensure that the port is in the vein and is not clogged.   °· If your port needs to remain accessed for a constant infusion, a clear (transparent) bandage will be placed over the needle site. The bandage and needle will need to be changed every week, or as directed by your health care provider.   °· Keep the bandage covering the needle clean and dry. Do not get it wet. Follow your health care provider's instructions on how to take a shower or bath while the port is accessed.   °· If your port does not need to stay accessed, no bandage is needed over the port.   °WHAT IS FLUSHING? °Flushing helps keep the port from getting clogged. Follow your health care provider's instructions on how and when to flush the port. Ports are usually flushed with saline solution or a medicine called heparin. The need for flushing will depend on how the port is used.  °· If the port is used for intermittent medicines or blood draws, the port will need to be flushed:   °· After medicines have been given.   °· After blood has been drawn.   °· As part of routine maintenance.   °· If a constant infusion is running, the port may not need to be flushed.   °HOW LONG WILL MY PORT STAY IMPLANTED? °The port can stay in for as long as your health care   provider thinks it is needed. When it is time for the port to come out, surgery will be done to remove it. The procedure is similar to the one performed when the port was put in.  °WHEN SHOULD I SEEK IMMEDIATE MEDICAL CARE? °When you have an implanted port, you should seek immediate medical care if:  °· You notice a bad smell coming from the incision site.   °· You have swelling, redness, or drainage at the incision site.   °· You have more swelling or pain at the port site or the surrounding area.   °· You have a fever that is not controlled with medicine. °Document Released: 09/17/2005 Document Revised: 07/08/2013 Document Reviewed: 05/25/2013 °ExitCare® Patient Information ©2015 [employer]. This  information is not intended to replace advice given to you by your health care provider. Make sure you discuss any questions you have with your health care provider. ° °Implanted Port Insertion, Care After °Refer to this sheet in the next few weeks. These instructions provide you with information on caring for yourself after your procedure. Your health care provider may also give you more specific instructions. Your treatment has been planned according to current medical practices, but problems sometimes occur. Call your health care provider if you have any problems or questions after your procedure. °WHAT TO EXPECT AFTER THE PROCEDURE °After your procedure, it is typical to have the following:  °· Discomfort at the port insertion site. Ice packs to the area will help. °· Bruising on the skin over the port. This will subside in 3-4 days. °HOME CARE INSTRUCTIONS °· After your port is placed, you will get a manufacturer's information card. The card has information about your port. Keep this card with you at all times.   °· Know what kind of port you have. There are many types of ports available.   °· Wear a medical alert bracelet in case of an emergency. This can help alert health care workers that you have a port.   °· The port can stay in for as long as your health care provider believes it is necessary.   °· A home health care nurse may give medicines and take care of the port.   °· You or a family member can get special training and directions for giving medicine and taking care of the port at home.   °SEEK MEDICAL CARE IF:  °· Your port does not flush or you are unable to get a blood return.   °· You have a fever or chills. °SEEK IMMEDIATE MEDICAL CARE IF: °· You have new fluid or pus coming from your incision.   °· You notice a bad smell coming from your incision site.   °· You have swelling, pain, or more redness at the incision or port site.   °· You have chest pain or shortness of breath. °Document Released:  07/08/2013 Document Revised: 09/22/2013 Document Reviewed: 07/08/2013 °Exit[employer]® Patient Information ©2015 [employer]. This information is not intended to replace advice given to you by your health care provider. Make sure you discuss any questions you have with your health care provider. ° °Conscious Sedation, Adult, Care After °Refer to this sheet in the next few weeks. These instructions provide you with information on caring for yourself after your procedure. Your health care provider may also give you more specific instructions. Your treatment has been planned according to current medical practices, but problems sometimes occur. Call your health care provider if you have any problems or questions after your procedure. °WHAT TO EXPECT AFTER THE PROCEDURE  °After   your procedure: °· You may feel sleepy, clumsy, and have poor balance for several hours. °· Vomiting may occur if you eat too soon after the procedure. °HOME CARE INSTRUCTIONS °· Do not participate in any activities where you could become injured for at least 24 hours. Do not: °¨ Drive. °¨ Swim. °¨ Ride a bicycle. °¨ Operate heavy machinery. °¨ Cook. °¨ Use power tools. °¨ Climb ladders. °¨ Work from a high place. °· Do not make important decisions or sign legal documents until you are improved. °· If you vomit, drink water, juice, or soup when you can drink without vomiting. Make sure you have little or no nausea before eating solid foods. °· Only take over-the-counter or prescription medicines for pain, discomfort, or fever as directed by your health care provider. °· Make sure you and your family fully understand everything about the medicines given to you, including what side effects may occur. °· You should not drink alcohol, take sleeping pills, or take medicines that cause drowsiness for at least 24 hours. °· If you smoke, do not smoke without supervision. °· If you are feeling better, you may resume normal activities 24 hours after you were  sedated. °· Keep all appointments with your health care provider. °SEEK MEDICAL CARE IF: °· Your skin is pale or bluish in color. °· You continue to feel nauseous or vomit. °· Your pain is getting worse and is not helped by medicine. °· You have bleeding or swelling. °· You are still sleepy or feeling clumsy after 24 hours. °SEEK IMMEDIATE MEDICAL CARE IF: °· You develop a rash. °· You have difficulty breathing. °· You develop any type of allergic problem. °· You have a fever. °MAKE SURE YOU: °· Understand these instructions. °· Will watch your condition. °· Will get help right away if you are not doing well or get worse. °Document Released: 07/08/2013 Document Reviewed: 07/08/2013 °Exit[employer]® Patient Information ©2015 [employer]. This information is not intended to replace advice given to you by your health care provider. Make sure you discuss any questions you have with your health care provider. ° ° ° ° °

## 2014-08-30 NOTE — Procedures (Signed)
R IJ Port cathter placement with US and fluoroscopy No complication No blood loss. See complete dictation in [facility] PACS.  

## 2014-09-01 ENCOUNTER — Other Ambulatory Visit (HOSPITAL_BASED_OUTPATIENT_CLINIC_OR_DEPARTMENT_OTHER): Payer: Medicare Other | Admitting: Lab

## 2014-09-01 ENCOUNTER — Encounter: Payer: Self-pay | Admitting: *Deleted

## 2014-09-01 ENCOUNTER — Telehealth: Payer: Self-pay | Admitting: *Deleted

## 2014-09-01 ENCOUNTER — Ambulatory Visit (HOSPITAL_BASED_OUTPATIENT_CLINIC_OR_DEPARTMENT_OTHER): Payer: Medicare Other

## 2014-09-01 VITALS — BP 130/106 | HR 105 | Temp 98.2°F | Resp 20

## 2014-09-01 DIAGNOSIS — C9110 Chronic lymphocytic leukemia of B-cell type not having achieved remission: Secondary | ICD-10-CM

## 2014-09-01 DIAGNOSIS — Z5111 Encounter for antineoplastic chemotherapy: Secondary | ICD-10-CM

## 2014-09-01 DIAGNOSIS — Z5112 Encounter for antineoplastic immunotherapy: Secondary | ICD-10-CM

## 2014-09-01 LAB — TECHNOLOGIST REVIEW CHCC SATELLITE

## 2014-09-01 LAB — CBC WITH DIFFERENTIAL (CANCER CENTER ONLY)
BASO#: 0.1 10*3/uL (ref 0.0–0.2)
BASO%: 0.1 % (ref 0.0–2.0)
EOS ABS: 0.4 10*3/uL (ref 0.0–0.5)
EOS%: 0.3 % (ref 0.0–7.0)
HCT: 35.8 % (ref 34.8–46.6)
HEMOGLOBIN: 11.6 g/dL (ref 11.6–15.9)
LYMPH#: 120.7 10*3/uL — ABNORMAL HIGH (ref 0.9–3.3)
LYMPH%: 92.3 % — ABNORMAL HIGH (ref 14.0–48.0)
MCH: 23 pg — ABNORMAL LOW (ref 26.0–34.0)
MCHC: 32.4 g/dL (ref 32.0–36.0)
MCV: 71 fL — ABNORMAL LOW (ref 81–101)
MONO#: 4.6 10*3/uL — AB (ref 0.1–0.9)
MONO%: 3.5 % (ref 0.0–13.0)
NEUT%: 3.8 % — ABNORMAL LOW (ref 39.6–80.0)
NEUTROS ABS: 5 10*3/uL (ref 1.5–6.5)
Platelets: 198 10*3/uL (ref 145–400)
RBC: 5.05 10*6/uL (ref 3.70–5.32)
RDW: 14.9 % (ref 11.1–15.7)
WBC: 130.8 10*3/uL — AB (ref 3.9–10.0)

## 2014-09-01 LAB — HEPATITIS PANEL, ACUTE
HCV Ab: NEGATIVE
HEP A IGM: NONREACTIVE
HEP B C IGM: NONREACTIVE
HEP B S AG: NEGATIVE

## 2014-09-01 LAB — CMP (CANCER CENTER ONLY)
ALT: 13 U/L (ref 10–47)
AST: 23 U/L (ref 11–38)
Albumin: 4 g/dL (ref 3.3–5.5)
Alkaline Phosphatase: 107 U/L — ABNORMAL HIGH (ref 26–84)
BILIRUBIN TOTAL: 0.5 mg/dL (ref 0.20–1.60)
BUN, Bld: 15 mg/dL (ref 7–22)
CO2: 26 mEq/L (ref 18–33)
Calcium: 10.9 mg/dL — ABNORMAL HIGH (ref 8.0–10.3)
Chloride: 103 mEq/L (ref 98–108)
Creat: 1.4 mg/dl — ABNORMAL HIGH (ref 0.6–1.2)
GLUCOSE: 168 mg/dL — AB (ref 73–118)
Potassium: 4.8 mEq/L — ABNORMAL HIGH (ref 3.3–4.7)
SODIUM: 143 meq/L (ref 128–145)
Total Protein: 7.3 g/dL (ref 6.4–8.1)

## 2014-09-01 MED ORDER — ONDANSETRON HCL 8 MG PO TABS: 8.0000 mg | ORAL_TABLET | Freq: Two times a day (BID) | ORAL | Status: DC

## 2014-09-01 MED ORDER — DEXAMETHASONE SODIUM PHOSPHATE 20 MG/5ML IJ SOLN
INTRAMUSCULAR | Status: AC
  Filled 2014-09-01: qty 5

## 2014-09-01 MED ORDER — SODIUM CHLORIDE 0.9 % IV SOLN
Freq: Once | INTRAVENOUS | Status: AC
  Administered 2014-09-01: 09:00:00 via INTRAVENOUS

## 2014-09-01 MED ORDER — DIPHENHYDRAMINE HCL 50 MG/ML IJ SOLN
INTRAMUSCULAR | Status: AC
  Filled 2014-09-01: qty 1

## 2014-09-01 MED ORDER — HEPARIN SOD (PORK) LOCK FLUSH 100 UNIT/ML IV SOLN
500.0000 [IU] | Freq: Once | INTRAVENOUS | Status: AC | PRN
  Administered 2014-09-01: 500 [IU]
  Filled 2014-09-01: qty 5

## 2014-09-01 MED ORDER — PROCHLORPERAZINE MALEATE 10 MG PO TABS: 10.0000 mg | ORAL_TABLET | Freq: Four times a day (QID) | ORAL | Status: DC | PRN

## 2014-09-01 MED ORDER — FAMOTIDINE IN NACL 20-0.9 MG/50ML-% IV SOLN
20.0000 mg | Freq: Two times a day (BID) | INTRAVENOUS | Status: DC
  Administered 2014-09-01: 20 mg via INTRAVENOUS

## 2014-09-01 MED ORDER — DEXAMETHASONE SODIUM PHOSPHATE 10 MG/ML IJ SOLN: 10.0000 mg | Freq: Once | INTRAMUSCULAR | Status: DC

## 2014-09-01 MED ORDER — DIPHENHYDRAMINE HCL 50 MG/ML IJ SOLN
50.0000 mg | Freq: Once | INTRAMUSCULAR | Status: AC
  Administered 2014-09-01: 50 mg via INTRAVENOUS

## 2014-09-01 MED ORDER — SODIUM CHLORIDE 0.9 % IV SOLN
100.0000 mg/m2 | Freq: Once | INTRAVENOUS | Status: AC
  Administered 2014-09-01: 225 mg via INTRAVENOUS
  Filled 2014-09-01: qty 2.5

## 2014-09-01 MED ORDER — DEXAMETHASONE SODIUM PHOSPHATE 20 MG/5ML IJ SOLN
20.0000 mg | Freq: Once | INTRAMUSCULAR | Status: AC
  Administered 2014-09-01: 20 mg via INTRAVENOUS

## 2014-09-01 MED ORDER — SODIUM CHLORIDE 0.9 % IJ SOLN
10.0000 mL | INTRAMUSCULAR | Status: DC | PRN
  Administered 2014-09-01: 10 mL
  Filled 2014-09-01: qty 10

## 2014-09-01 MED ORDER — SODIUM CHLORIDE 0.9 % IV SOLN
100.0000 mg | Freq: Once | INTRAVENOUS | Status: AC
  Administered 2014-09-01: 100 mg via INTRAVENOUS
  Filled 2014-09-01: qty 4

## 2014-09-01 MED ORDER — IBUPROFEN 200 MG PO TABS
400.0000 mg | ORAL_TABLET | Freq: Once | ORAL | Status: AC
  Administered 2014-09-01: 400 mg via ORAL

## 2014-09-01 MED ORDER — MEPERIDINE HCL 25 MG/ML IJ SOLN
INTRAMUSCULAR | Status: AC
  Filled 2014-09-01: qty 1

## 2014-09-01 MED ORDER — LORAZEPAM 1 MG PO TABS
ORAL_TABLET | ORAL | Status: AC
  Filled 2014-09-01: qty 1

## 2014-09-01 MED ORDER — LIDOCAINE-PRILOCAINE 2.5-2.5 % EX CREA: TOPICAL_CREAM | CUTANEOUS | Status: DC

## 2014-09-01 MED ORDER — MEPERIDINE HCL 25 MG/ML IJ SOLN
25.0000 mg | Freq: Once | INTRAMUSCULAR | Status: AC
  Administered 2014-09-01: 25 mg via INTRAVENOUS

## 2014-09-01 MED ORDER — LORAZEPAM 1 MG PO TABS
1.0000 mg | ORAL_TABLET | Freq: Once | ORAL | Status: AC
  Administered 2014-09-01: 1 mg via ORAL

## 2014-09-01 MED ORDER — FAMOTIDINE IN NACL 20-0.9 MG/50ML-% IV SOLN
INTRAVENOUS | Status: AC
  Filled 2014-09-01: qty 100

## 2014-09-01 MED ORDER — IBUPROFEN 200 MG PO TABS
ORAL_TABLET | ORAL | Status: AC
  Filled 2014-09-01: qty 2

## 2014-09-01 MED ORDER — FAMOTIDINE IN NACL 20-0.9 MG/50ML-% IV SOLN
20.0000 mg | Freq: Once | INTRAVENOUS | Status: AC | PRN
  Administered 2014-09-01: 20 mg via INTRAVENOUS

## 2014-09-01 MED ORDER — ONDANSETRON 8 MG/50ML IVPB (CHCC)
8.0000 mg | Freq: Once | INTRAVENOUS | Status: AC
  Administered 2014-09-01: 8 mg via INTRAVENOUS

## 2014-09-01 NOTE — Telephone Encounter (Signed)
WBC 130.8 Consistent with previous value. Dr Marin Olp notified, no orders made.

## 2014-09-01 NOTE — Patient Instructions (Addendum)
Bendamustine Injection What is this medicine? BENDAMUSTINE (BEN da MUS teen) is a chemotherapy drug. It is used to treat chronic lymphocytic leukemia and non-Hodgkin lymphoma. This medicine may be used for other purposes; ask your health care provider or pharmacist if you have questions. COMMON BRAND NAME(S): Treanda What should I tell my health care provider before I take this medicine? They need to know if you have any of these conditions: -kidney disease -liver disease -an unusual or allergic reaction to bendamustine, mannitol, other medicines, foods, dyes, or preservatives -pregnant or trying to get pregnant -breast-feeding How should I use this medicine? This medicine is for infusion into a vein. It is given by a health care professional in a hospital or clinic setting. Talk to your pediatrician regarding the use of this medicine in children. Special care may be needed. Overdosage: If you think you have taken too much of this medicine contact a poison control center or emergency room at once. NOTE: This medicine is only for you. Do not share this medicine with others. What if I miss a dose? It is important not to miss your dose. Call your doctor or health care professional if you are unable to keep an appointment. What may interact with this medicine? Do not take this medicine with any of the following medications: -clozapine This medicine may also interact with the following medications: -atazanavir -cimetidine -ciprofloxacin -enoxacin -fluvoxamine -medicines for seizures like carbamazepine and phenobarbital -mexiletine -rifampin -tacrine -thiabendazole -zileuton This list may not describe all possible interactions. Give your health care provider a list of all the medicines, herbs, non-prescription drugs, or dietary supplements you use. Also tell them if you smoke, drink alcohol, or use illegal drugs. Some items may interact with your medicine. What should I watch for while  using this medicine? Your condition will be monitored carefully while you are receiving this medicine. This drug may make you feel generally unwell. This is not uncommon, as chemotherapy can affect healthy cells as well as cancer cells. Report any side effects. Continue your course of treatment even though you feel ill unless your doctor tells you to stop. Call your doctor or health care professional for advice if you get a fever, chills or sore throat, or other symptoms of a cold or flu. Do not treat yourself. This drug decreases your body's ability to fight infections. Try to avoid being around people who are sick. This medicine may increase your risk to bruise or bleed. Call your doctor or health care professional if you notice any unusual bleeding. Be careful brushing and flossing your teeth or using a toothpick because you may get an infection or bleed more easily. If you have any dental work done, tell your dentist you are receiving this medicine. Avoid taking products that contain aspirin, acetaminophen, ibuprofen, naproxen, or ketoprofen unless instructed by your doctor. These medicines may hide a fever. Do not become pregnant while taking this medicine. Women should inform their doctor if they wish to become pregnant or think they might be pregnant. There is a potential for serious side effects to an unborn child. Men should inform their doctors if they wish to father a child. This medicine may lower sperm counts. Talk to your health care professional or pharmacist for more information. Do not breast-feed an infant while taking this medicine. What side effects may I notice from receiving this medicine? Side effects that you should report to your doctor or health care professional as soon as possible: -allergic reactions like skin rash,  itching or hives, swelling of the face, lips, or tongue -low blood counts - this medicine may decrease the number of white blood cells, red blood cells and  platelets. You may be at increased risk for infections and bleeding. -signs of infection - fever or chills, cough, sore throat, pain or difficulty passing urine -signs of decreased platelets or bleeding - bruising, pinpoint red spots on the skin, black, tarry stools, blood in the urine -signs of decreased red blood cells - unusually weak or tired, fainting spells, lightheadedness -trouble passing urine or change in the amount of urine Side effects that usually do not require medical attention (report to your doctor or health care professional if they continue or are bothersome): -diarrhea This list may not describe all possible side effects. Call your doctor for medical advice about side effects. You may report side effects to FDA at 1-800-FDA-1088. Where should I keep my medicine? This drug is given in a hospital or clinic and will not be stored at home. NOTE: This sheet is a summary. It may not cover all possible information. If you have questions about this medicine, talk to your doctor, pharmacist, or health care provider.  2015, [employer] Standard. (2011-12-14 14:15:47) Obinutuzumab injection What is this medicine? OBINUTUZUMAB (OH bi nue TOOZ ue mab) is a monoclonal antibody. It is used to treat chronic lymphocytic leukemia. This drug targets a specific protein on cancer cells and stops them from growing. This medicine may be used for other purposes; ask your health care provider or pharmacist if you have questions. COMMON BRAND NAME(S): GAZYVA What should I tell my health care provider before I take this medicine? They need to know if you have any of these conditions: -hepatitis -low blood counts, like low white cell, platelet, or red cell counts -lung or breathing disease -heart disease -an unusual or allergic reaction to ofatumumab, other medicines, foods, dyes, or preservatives -pregnant or trying to get pregnant -breast-feeding How should I use this medicine? This medicine  is for infusion into a vein. It is given by a health care professional in a hospital or clinic setting. Talk to your pediatrician regarding the use of this medicine in children. Special care may be needed. Overdosage: If you think you've taken too much of this medicine contact a poison control center or emergency room at once. Overdosage: If you think you have taken too much of this medicine contact a poison control center or emergency room at once. NOTE: This medicine is only for you. Do not share this medicine with others. What if I miss a dose? Keep appointments for follow-up doses as directed. It is important not to miss your dose. Call your doctor or health care professional if you are unable to keep an appointment. What may interact with this medicine? -live virus vaccines This list may not describe all possible interactions. Give your health care provider a list of all the medicines, herbs, non-prescription drugs, or dietary supplements you use. Also tell them if you smoke, drink alcohol, or use illegal drugs. Some items may interact with your medicine. What should I watch for while using this medicine? Report any side effects that you notice during your treatment right away, such as changes in your breathing, fever, chills, dizziness or lightheadedness. These effects are more common with the first dose. Visit your prescriber or health care professional for checks on your progress. You will need to have regular blood work. Report any other side effects. The side effects of this medicine can   continue after you finish your treatment. Continue your course of treatment even though you feel ill unless your doctor tells you to stop. Call your doctor or health care professional for advice if you get a fever, chills or sore throat, or other symptoms of a cold or flu. Do not treat yourself. This drug decreases your body's ability to fight infections. Try to avoid being around people who are sick. This  medicine may increase your risk to bruise or bleed. Call your doctor or health care professional if you notice any unusual bleeding. Be careful brushing and flossing your teeth or using a toothpick because you may get an infection or bleed more easily. If you have any dental work done, tell your dentist you are receiving this medicine. Avoid taking products that contain aspirin, acetaminophen, ibuprofen, naproxen, or ketoprofen unless instructed by your doctor. These medicines may hide a fever. Do not become pregnant while taking this medicine. Women should inform their doctor if they wish to become pregnant or think they might be pregnant. There is a potential for serious side effects to an unborn child. Talk to your health care professional or pharmacist for more information. Do not breast-feed an infant while taking this medicine. What side effects may I notice from receiving this medicine? Side effects that you should report to your doctor or health care professional as soon as possible: -allergic reactions like skin rash, itching or hives, swelling of the face, lips, or tongue -breathing problems -changes in vision -chest pain or chest tightness -chills -confusion, trouble speaking or understanding -cough -diarrhea -dizziness -fainting spells -fever -general ill feeling or flu-like symptoms -lightheadedness -loss of balance or coordination -nausea, vomiting -right upper belly pain -trouble walking -unusual bleeding or bruising -unusually weak or tired -yellowing of the eyes or skin Side effects that usually do not require medical attention (Report these to your doctor or health care professional if they continue or are bothersome.): -muscle pain -muscle cramps This list may not describe all possible side effects. Call your doctor for medical advice about side effects. You may report side effects to FDA at 1-800-FDA-1088. Where should I keep my medicine? This drug is only given in  a hospital or clinic and will not be stored at home. NOTE: This sheet is a summary. It may not cover all possible information. If you have questions about this medicine, talk to your doctor, pharmacist, or health care provider.  2015, [employer] Standard. (2012-08-07 18:24:29) [facility] Discharge Instructions for Patients Receiving Chemotherapy  Today you received the following chemotherapy agents:  Treanda and Gazyva  To help prevent nausea and vomiting after your treatment, we encourage you to take your nausea medication as directed on the bottle.  They have been called into your pharmacy.   If you develop nausea and vomiting that is not controlled by your nausea medication, call the clinic.   BELOW ARE SYMPTOMS THAT SHOULD BE REPORTED IMMEDIATELY:  *FEVER GREATER THAN 100.5 F  *CHILLS WITH OR WITHOUT FEVER  NAUSEA AND VOMITING THAT IS NOT CONTROLLED WITH YOUR NAUSEA MEDICATION  *UNUSUAL SHORTNESS OF BREATH  *UNUSUAL BRUISING OR BLEEDING  TENDERNESS IN MOUTH AND THROAT WITH OR WITHOUT PRESENCE OF ULCERS  *URINARY PROBLEMS  *BOWEL PROBLEMS  UNUSUAL RASH Items with * indicate a potential emergency and should be followed up as soon as possible.  Feel free to call the clinic you have any questions or concerns. The clinic phone number is [phone].    

## 2014-09-01 NOTE — Progress Notes (Signed)
9:17 AM Patient teary due to unsuccessful port access, site swollen and tender. Pt was under impression she would not feel anything.  Dr. Marin Olp notified, at bedside. Will give Ativan and ice pack applied.   11:01 AM Gazyva paused due to chest feeling tight, also complain of back pain in scapula area.  Dr. Marin Olp notified, Pepcid 40 mg IV.  Oxygen started at 2 LPM. 11:35  Rigors, Dr. Marin Olp notified, Demerol IV. 12:31 PM Sleeping, arouses easily, no complaints. Dr. Marin Olp aware, to re-start [facility].

## 2014-09-02 ENCOUNTER — Encounter: Payer: Self-pay | Admitting: Hematology & Oncology

## 2014-09-02 ENCOUNTER — Ambulatory Visit (HOSPITAL_BASED_OUTPATIENT_CLINIC_OR_DEPARTMENT_OTHER): Payer: Medicare Other

## 2014-09-02 DIAGNOSIS — Z5111 Encounter for antineoplastic chemotherapy: Secondary | ICD-10-CM

## 2014-09-02 DIAGNOSIS — C9110 Chronic lymphocytic leukemia of B-cell type not having achieved remission: Secondary | ICD-10-CM

## 2014-09-02 DIAGNOSIS — Z5112 Encounter for antineoplastic immunotherapy: Secondary | ICD-10-CM

## 2014-09-02 MED ORDER — FAMOTIDINE IN NACL 20-0.9 MG/50ML-% IV SOLN
INTRAVENOUS | Status: AC
  Filled 2014-09-02: qty 100

## 2014-09-02 MED ORDER — DEXAMETHASONE SODIUM PHOSPHATE 20 MG/5ML IJ SOLN
20.0000 mg | Freq: Once | INTRAMUSCULAR | Status: AC
  Administered 2014-09-02: 20 mg via INTRAVENOUS

## 2014-09-02 MED ORDER — DIPHENHYDRAMINE HCL 50 MG/ML IJ SOLN
50.0000 mg | Freq: Once | INTRAMUSCULAR | Status: AC
Start: 2014-09-02 — End: 2014-09-02
  Administered 2014-09-02: 50 mg via INTRAVENOUS

## 2014-09-02 MED ORDER — HEPARIN SOD (PORK) LOCK FLUSH 100 UNIT/ML IV SOLN
500.0000 [IU] | Freq: Once | INTRAVENOUS | Status: AC | PRN
Start: 2014-09-02 — End: 2014-09-02
  Administered 2014-09-02: 500 [IU]
  Filled 2014-09-02: qty 5

## 2014-09-02 MED ORDER — SODIUM CHLORIDE 0.9 % IJ SOLN
10.0000 mL | INTRAMUSCULAR | Status: DC | PRN
  Administered 2014-09-02: 10 mL
  Filled 2014-09-02: qty 10

## 2014-09-02 MED ORDER — SODIUM CHLORIDE 0.9 % IV SOLN
Freq: Once | INTRAVENOUS | Status: AC
Start: 2014-09-02 — End: 2014-09-02
  Administered 2014-09-02: 09:00:00 via INTRAVENOUS

## 2014-09-02 MED ORDER — DEXAMETHASONE SODIUM PHOSPHATE 20 MG/5ML IJ SOLN
INTRAMUSCULAR | Status: AC
  Filled 2014-09-02: qty 5

## 2014-09-02 MED ORDER — FAMOTIDINE IN NACL 20-0.9 MG/50ML-% IV SOLN
40.0000 mg | Freq: Once | INTRAVENOUS | Status: AC
  Administered 2014-09-02: 40 mg via INTRAVENOUS

## 2014-09-02 MED ORDER — ONDANSETRON 8 MG/50ML IVPB (CHCC)
8.0000 mg | Freq: Once | INTRAVENOUS | Status: AC
  Administered 2014-09-02: 8 mg via INTRAVENOUS

## 2014-09-02 MED ORDER — SODIUM CHLORIDE 0.9 % IV SOLN
900.0000 mg | Freq: Once | INTRAVENOUS | Status: AC
  Administered 2014-09-02: 900 mg via INTRAVENOUS
  Filled 2014-09-02: qty 36

## 2014-09-02 MED ORDER — IBUPROFEN 200 MG PO TABS
400.0000 mg | ORAL_TABLET | Freq: Once | ORAL | Status: AC
  Administered 2014-09-02: 400 mg via ORAL

## 2014-09-02 MED ORDER — SODIUM CHLORIDE 0.9 % IV SOLN
100.0000 mg/m2 | Freq: Once | INTRAVENOUS | Status: AC
  Administered 2014-09-02: 225 mg via INTRAVENOUS
  Filled 2014-09-02: qty 2.5

## 2014-09-02 MED ORDER — IBUPROFEN 200 MG PO TABS
ORAL_TABLET | ORAL | Status: AC
  Filled 2014-09-02: qty 2

## 2014-09-02 MED ORDER — DIPHENHYDRAMINE HCL 50 MG/ML IJ SOLN
INTRAMUSCULAR | Status: AC
  Filled 2014-09-02: qty 1

## 2014-09-02 NOTE — Patient Instructions (Signed)
[facility] Discharge Instructions for Patients Receiving Chemotherapy  Today you received the following chemotherapy agents Gayzva and Treanda. To help prevent nausea and vomiting after your treatment, we encourage you to take your nausea medication as prescribed.    If you develop nausea and vomiting that is not controlled by your nausea medication, call the clinic.   BELOW ARE SYMPTOMS THAT SHOULD BE REPORTED IMMEDIATELY:  *FEVER GREATER THAN 100.5 F  *CHILLS WITH OR WITHOUT FEVER  NAUSEA AND VOMITING THAT IS NOT CONTROLLED WITH YOUR NAUSEA MEDICATION  *UNUSUAL SHORTNESS OF BREATH  *UNUSUAL BRUISING OR BLEEDING  TENDERNESS IN MOUTH AND THROAT WITH OR WITHOUT PRESENCE OF ULCERS  *URINARY PROBLEMS  *BOWEL PROBLEMS  UNUSUAL RASH Items with * indicate a potential emergency and should be followed up as soon as possible.  Feel free to call the clinic you have any questions or concerns. The clinic phone number is [phone].    

## 2014-09-06 ENCOUNTER — Encounter: Payer: Self-pay | Admitting: Nurse Practitioner

## 2014-09-06 NOTE — Progress Notes (Signed)
24 Hour Chemotherapy Follow up Call  Megan Tate called at home following administration of Gazyva and Treanda chemotherapy. Patient reports some nausea without vomitting. Currently taking anti-emetics as ordered and getting plenty of rest.   Medications reviewed Compazine suppositories 25mg  1 per rectum every 6 hours as needed for nausea or vomiting and Zofran 8mg  daily Days 2,3,4 post chemo   Following interventions recommended eat soft bland foods, avoid hot foods and smells that would precipitate nausea.  Reviewed all post chemotherapy instructions with patient and when should call clinic or MD.  Patient verbalized understanding.

## 2014-09-07 ENCOUNTER — Other Ambulatory Visit: Payer: Self-pay | Admitting: Nurse Practitioner

## 2014-09-07 DIAGNOSIS — C9110 Chronic lymphocytic leukemia of B-cell type not having achieved remission: Secondary | ICD-10-CM

## 2014-09-08 ENCOUNTER — Other Ambulatory Visit: Payer: Self-pay | Admitting: Hematology & Oncology

## 2014-09-08 ENCOUNTER — Other Ambulatory Visit (HOSPITAL_BASED_OUTPATIENT_CLINIC_OR_DEPARTMENT_OTHER): Payer: Medicare Other | Admitting: Lab

## 2014-09-08 ENCOUNTER — Ambulatory Visit (HOSPITAL_BASED_OUTPATIENT_CLINIC_OR_DEPARTMENT_OTHER): Payer: Medicare Other

## 2014-09-08 ENCOUNTER — Other Ambulatory Visit: Payer: Self-pay | Admitting: *Deleted

## 2014-09-08 DIAGNOSIS — C9110 Chronic lymphocytic leukemia of B-cell type not having achieved remission: Secondary | ICD-10-CM

## 2014-09-08 DIAGNOSIS — Z5112 Encounter for antineoplastic immunotherapy: Secondary | ICD-10-CM

## 2014-09-08 LAB — CBC WITH DIFFERENTIAL (CANCER CENTER ONLY)
BASO#: 0 10*3/uL (ref 0.0–0.2)
BASO%: 0 % (ref 0.0–2.0)
EOS ABS: 0 10*3/uL (ref 0.0–0.5)
EOS%: 2.3 % (ref 0.0–7.0)
HEMATOCRIT: 34.4 % — AB (ref 34.8–46.6)
HEMOGLOBIN: 11.7 g/dL (ref 11.6–15.9)
LYMPH#: 0.1 10*3/uL — ABNORMAL LOW (ref 0.9–3.3)
LYMPH%: 10.9 % — ABNORMAL LOW (ref 14.0–48.0)
MCH: 23.1 pg — AB (ref 26.0–34.0)
MCHC: 34 g/dL (ref 32.0–36.0)
MCV: 68 fL — AB (ref 81–101)
MONO#: 0.5 10*3/uL (ref 0.1–0.9)
MONO%: 38.3 % — AB (ref 0.0–13.0)
NEUT#: 0.6 10*3/uL — ABNORMAL LOW (ref 1.5–6.5)
NEUT%: 48.5 % (ref 39.6–80.0)
Platelets: 131 10*3/uL — ABNORMAL LOW (ref 145–400)
RBC: 5.07 10*6/uL (ref 3.70–5.32)
RDW: 13.3 % (ref 11.1–15.7)
WBC: 1.3 10*3/uL — ABNORMAL LOW (ref 3.9–10.0)

## 2014-09-08 LAB — CMP (CANCER CENTER ONLY)
ALBUMIN: 3.5 g/dL (ref 3.3–5.5)
ALK PHOS: 79 U/L (ref 26–84)
ALT(SGPT): 18 U/L (ref 10–47)
AST: 13 U/L (ref 11–38)
BUN, Bld: 31 mg/dL — ABNORMAL HIGH (ref 7–22)
CALCIUM: 11 mg/dL — AB (ref 8.0–10.3)
CO2: 28 mEq/L (ref 18–33)
Chloride: 95 mEq/L — ABNORMAL LOW (ref 98–108)
Creat: 1.4 mg/dl — ABNORMAL HIGH (ref 0.6–1.2)
GLUCOSE: 411 mg/dL — AB (ref 73–118)
POTASSIUM: 5 meq/L — AB (ref 3.3–4.7)
Sodium: 136 mEq/L (ref 128–145)
Total Bilirubin: 1 mg/dl (ref 0.20–1.60)
Total Protein: 6.9 g/dL (ref 6.4–8.1)

## 2014-09-08 MED ORDER — FAMOTIDINE IN NACL 20-0.9 MG/50ML-% IV SOLN
40.0000 mg | Freq: Once | INTRAVENOUS | Status: AC
Start: 2014-09-08 — End: 2014-09-08
  Administered 2014-09-08: 40 mg via INTRAVENOUS

## 2014-09-08 MED ORDER — DEXAMETHASONE SODIUM PHOSPHATE 20 MG/5ML IJ SOLN
20.0000 mg | Freq: Once | INTRAMUSCULAR | Status: AC
  Administered 2014-09-08: 20 mg via INTRAVENOUS

## 2014-09-08 MED ORDER — LORAZEPAM 0.5 MG PO TABS: 0.5000 mg | ORAL_TABLET | Freq: Three times a day (TID) | ORAL | Status: DC

## 2014-09-08 MED ORDER — SODIUM CHLORIDE 0.9 % IV SOLN
Freq: Once | INTRAVENOUS | Status: AC
  Administered 2014-09-08: 10:00:00 via INTRAVENOUS

## 2014-09-08 MED ORDER — FAMOTIDINE IN NACL 20-0.9 MG/50ML-% IV SOLN
INTRAVENOUS | Status: AC
  Filled 2014-09-08: qty 100

## 2014-09-08 MED ORDER — DEXAMETHASONE SODIUM PHOSPHATE 20 MG/5ML IJ SOLN
INTRAMUSCULAR | Status: AC
  Filled 2014-09-08: qty 5

## 2014-09-08 MED ORDER — LORAZEPAM 0.5 MG PO TABS
ORAL_TABLET | ORAL | Status: AC
  Filled 2014-09-08: qty 1

## 2014-09-08 MED ORDER — LORAZEPAM 0.5 MG PO TABS: 0.5000 mg | ORAL_TABLET | Freq: Once | ORAL | Status: DC

## 2014-09-08 MED ORDER — HEPARIN SOD (PORK) LOCK FLUSH 100 UNIT/ML IV SOLN
500.0000 [IU] | Freq: Once | INTRAVENOUS | Status: AC | PRN
  Administered 2014-09-08: 500 [IU]
  Filled 2014-09-08: qty 5

## 2014-09-08 MED ORDER — IBUPROFEN 200 MG PO TABS
400.0000 mg | ORAL_TABLET | Freq: Once | ORAL | Status: AC
  Administered 2014-09-08: 400 mg via ORAL

## 2014-09-08 MED ORDER — SODIUM CHLORIDE 0.9 % IV SOLN
1000.0000 mg | Freq: Once | INTRAVENOUS | Status: AC
  Administered 2014-09-08: 1000 mg via INTRAVENOUS
  Filled 2014-09-08: qty 40

## 2014-09-08 MED ORDER — SODIUM CHLORIDE 0.9 % IJ SOLN
10.0000 mL | INTRAMUSCULAR | Status: DC | PRN
  Administered 2014-09-08: 10 mL
  Filled 2014-09-08: qty 10

## 2014-09-08 MED ORDER — IBUPROFEN 200 MG PO TABS
ORAL_TABLET | ORAL | Status: AC
  Filled 2014-09-08: qty 2

## 2014-09-08 MED ORDER — DIPHENHYDRAMINE HCL 50 MG/ML IJ SOLN
INTRAMUSCULAR | Status: AC
  Filled 2014-09-08: qty 1

## 2014-09-08 MED ORDER — DIPHENHYDRAMINE HCL 50 MG/ML IJ SOLN
50.0000 mg | Freq: Once | INTRAMUSCULAR | Status: AC
  Administered 2014-09-08: 50 mg via INTRAVENOUS

## 2014-09-08 NOTE — Progress Notes (Signed)
[name] Note:  Please call and let her know that her blood sugar is way too high. Is she taking insulin.?? Please insure that when she comes in next week, that we run a STAT b-met. Megan Tate ______

## 2014-09-08 NOTE — Patient Instructions (Signed)
Obinutuzumab injection What is this medicine? OBINUTUZUMAB (OH bi nue TOOZ ue mab) is a monoclonal antibody. It is used to treat chronic lymphocytic leukemia. This drug targets a specific protein on cancer cells and stops them from growing. This medicine may be used for other purposes; ask your health care provider or pharmacist if you have questions. COMMON BRAND NAME(S): GAZYVA What should I tell my health care provider before I take this medicine? They need to know if you have any of these conditions: -hepatitis -low blood counts, like low white cell, platelet, or red cell counts -lung or breathing disease -heart disease -an unusual or allergic reaction to ofatumumab, other medicines, foods, dyes, or preservatives -pregnant or trying to get pregnant -breast-feeding How should I use this medicine? This medicine is for infusion into a vein. It is given by a health care professional in a hospital or clinic setting. Talk to your pediatrician regarding the use of this medicine in children. Special care may be needed. Overdosage: If you think you've taken too much of this medicine contact a poison control center or emergency room at once. Overdosage: If you think you have taken too much of this medicine contact a poison control center or emergency room at once. NOTE: This medicine is only for you. Do not share this medicine with others. What if I miss a dose? Keep appointments for follow-up doses as directed. It is important not to miss your dose. Call your doctor or health care professional if you are unable to keep an appointment. What may interact with this medicine? -live virus vaccines This list may not describe all possible interactions. Give your health care provider a list of all the medicines, herbs, non-prescription drugs, or dietary supplements you use. Also tell them if you smoke, drink alcohol, or use illegal drugs. Some items may interact with your medicine. What should I watch  for while using this medicine? Report any side effects that you notice during your treatment right away, such as changes in your breathing, fever, chills, dizziness or lightheadedness. These effects are more common with the first dose. Visit your prescriber or health care professional for checks on your progress. You will need to have regular blood work. Report any other side effects. The side effects of this medicine can continue after you finish your treatment. Continue your course of treatment even though you feel ill unless your doctor tells you to stop. Call your doctor or health care professional for advice if you get a fever, chills or sore throat, or other symptoms of a cold or flu. Do not treat yourself. This drug decreases your body's ability to fight infections. Try to avoid being around people who are sick. This medicine may increase your risk to bruise or bleed. Call your doctor or health care professional if you notice any unusual bleeding. Be careful brushing and flossing your teeth or using a toothpick because you may get an infection or bleed more easily. If you have any dental work done, tell your dentist you are receiving this medicine. Avoid taking products that contain aspirin, acetaminophen, ibuprofen, naproxen, or ketoprofen unless instructed by your doctor. These medicines may hide a fever. Do not become pregnant while taking this medicine. Women should inform their doctor if they wish to become pregnant or think they might be pregnant. There is a potential for serious side effects to an unborn child. Talk to your health care professional or pharmacist for more information. Do not breast-feed an infant while taking this   medicine. What side effects may I notice from receiving this medicine? Side effects that you should report to your doctor or health care professional as soon as possible: -allergic reactions like skin rash, itching or hives, swelling of the face, lips, or  tongue -breathing problems -changes in vision -chest pain or chest tightness -chills -confusion, trouble speaking or understanding -cough -diarrhea -dizziness -fainting spells -fever -general ill feeling or flu-like symptoms -lightheadedness -loss of balance or coordination -nausea, vomiting -right upper belly pain -trouble walking -unusual bleeding or bruising -unusually weak or tired -yellowing of the eyes or skin Side effects that usually do not require medical attention (Report these to your doctor or health care professional if they continue or are bothersome.): -muscle pain -muscle cramps This list may not describe all possible side effects. Call your doctor for medical advice about side effects. You may report side effects to FDA at 1-800-FDA-1088. Where should I keep my medicine? This drug is only given in a hospital or clinic and will not be stored at home. NOTE: This sheet is a summary. It may not cover all possible information. If you have questions about this medicine, talk to your doctor, pharmacist, or health care provider.  2015, [employer] Standard. (2012-08-07 18:24:29)  

## 2014-09-09 ENCOUNTER — Telehealth: Payer: Self-pay | Admitting: *Deleted

## 2014-09-09 ENCOUNTER — Other Ambulatory Visit: Payer: Self-pay | Admitting: *Deleted

## 2014-09-09 DIAGNOSIS — C9110 Chronic lymphocytic leukemia of B-cell type not having achieved remission: Secondary | ICD-10-CM

## 2014-09-09 NOTE — Telephone Encounter (Addendum)
Spoke with patient who states that she is taking her insulin, but forgot on Tuesday. She has taken her blood sugar this morning and it was 430. She stated she took her insulin today. I told her to take her blood sugar again this afternoon and if it was still elevated she needed to call her physician who controls her diabetes. She was in understanding. Orders placed for stat labs on 12/16 during her next visit.  ----- Message from Volanda Napoleon, MD sent at 09/08/2014  6:20 PM EST ----- Please call and let her know that her blood sugar is way too high. Is she taking insulin.?? Please insure that when she comes in next week, that we run a STAT b-met.  pete

## 2014-09-15 ENCOUNTER — Ambulatory Visit (HOSPITAL_BASED_OUTPATIENT_CLINIC_OR_DEPARTMENT_OTHER): Payer: Medicare Other | Admitting: Family

## 2014-09-15 ENCOUNTER — Other Ambulatory Visit (HOSPITAL_BASED_OUTPATIENT_CLINIC_OR_DEPARTMENT_OTHER): Payer: Medicare Other | Admitting: Lab

## 2014-09-15 ENCOUNTER — Encounter: Payer: Self-pay | Admitting: Family

## 2014-09-15 ENCOUNTER — Ambulatory Visit (HOSPITAL_BASED_OUTPATIENT_CLINIC_OR_DEPARTMENT_OTHER): Payer: Medicare Other

## 2014-09-15 DIAGNOSIS — C9110 Chronic lymphocytic leukemia of B-cell type not having achieved remission: Secondary | ICD-10-CM

## 2014-09-15 DIAGNOSIS — E119 Type 2 diabetes mellitus without complications: Secondary | ICD-10-CM | POA: Diagnosis not present

## 2014-09-15 DIAGNOSIS — Z5112 Encounter for antineoplastic immunotherapy: Secondary | ICD-10-CM

## 2014-09-15 LAB — CBC WITH DIFFERENTIAL (CANCER CENTER ONLY)
BASO#: 0 10*3/uL (ref 0.0–0.2)
BASO%: 0.6 % (ref 0.0–2.0)
EOS%: 0.6 % (ref 0.0–7.0)
Eosinophils Absolute: 0 10*3/uL (ref 0.0–0.5)
HCT: 29.4 % — ABNORMAL LOW (ref 34.8–46.6)
HGB: 9.8 g/dL — ABNORMAL LOW (ref 11.6–15.9)
LYMPH#: 0.3 10*3/uL — ABNORMAL LOW (ref 0.9–3.3)
LYMPH%: 16.5 % (ref 14.0–48.0)
MCH: 23.3 pg — ABNORMAL LOW (ref 26.0–34.0)
MCHC: 33.3 g/dL (ref 32.0–36.0)
MCV: 70 fL — ABNORMAL LOW (ref 81–101)
MONO#: 0.7 10*3/uL (ref 0.1–0.9)
MONO%: 41.5 % — ABNORMAL HIGH (ref 0.0–13.0)
NEUT#: 0.7 10*3/uL — ABNORMAL LOW (ref 1.5–6.5)
NEUT%: 40.8 % (ref 39.6–80.0)
PLATELETS: 164 10*3/uL (ref 145–400)
RBC: 4.21 10*6/uL (ref 3.70–5.32)
RDW: 13.7 % (ref 11.1–15.7)
WBC: 1.6 10*3/uL — AB (ref 3.9–10.0)

## 2014-09-15 LAB — CMP (CANCER CENTER ONLY)
ALT: 13 U/L (ref 10–47)
AST: 16 U/L (ref 11–38)
Albumin: 3.3 g/dL (ref 3.3–5.5)
Alkaline Phosphatase: 95 U/L — ABNORMAL HIGH (ref 26–84)
BILIRUBIN TOTAL: 0.5 mg/dL (ref 0.20–1.60)
BUN, Bld: 13 mg/dL (ref 7–22)
CO2: 27 meq/L (ref 18–33)
Calcium: 10.1 mg/dL (ref 8.0–10.3)
Chloride: 101 mEq/L (ref 98–108)
Creat: 1 mg/dl (ref 0.6–1.2)
Glucose, Bld: 220 mg/dL — ABNORMAL HIGH (ref 73–118)
Potassium: 4.1 mEq/L (ref 3.3–4.7)
SODIUM: 135 meq/L (ref 128–145)
TOTAL PROTEIN: 6 g/dL — AB (ref 6.4–8.1)

## 2014-09-15 LAB — TECHNOLOGIST REVIEW CHCC SATELLITE

## 2014-09-15 MED ORDER — FAMOTIDINE IN NACL 20-0.9 MG/50ML-% IV SOLN
40.0000 mg | Freq: Once | INTRAVENOUS | Status: AC
  Administered 2014-09-15: 40 mg via INTRAVENOUS

## 2014-09-15 MED ORDER — OBINUTUZUMAB CHEMO INJECTION 1000 MG/40ML
1000.0000 mg | Freq: Once | INTRAVENOUS | Status: AC
  Administered 2014-09-15: 1000 mg via INTRAVENOUS
  Filled 2014-09-15: qty 40

## 2014-09-15 MED ORDER — DEXAMETHASONE SODIUM PHOSPHATE 20 MG/5ML IJ SOLN
INTRAMUSCULAR | Status: AC
  Filled 2014-09-15: qty 5

## 2014-09-15 MED ORDER — DIPHENHYDRAMINE HCL 50 MG/ML IJ SOLN
INTRAMUSCULAR | Status: AC
  Filled 2014-09-15: qty 1

## 2014-09-15 MED ORDER — HEPARIN SOD (PORK) LOCK FLUSH 100 UNIT/ML IV SOLN
500.0000 [IU] | Freq: Once | INTRAVENOUS | Status: AC | PRN
  Administered 2014-09-15: 500 [IU]
  Filled 2014-09-15: qty 5

## 2014-09-15 MED ORDER — SODIUM CHLORIDE 0.9 % IV SOLN
Freq: Once | INTRAVENOUS | Status: AC
  Administered 2014-09-15: 11:00:00 via INTRAVENOUS

## 2014-09-15 MED ORDER — IBUPROFEN 200 MG PO TABS
400.0000 mg | ORAL_TABLET | Freq: Once | ORAL | Status: AC
  Administered 2014-09-15: 400 mg via ORAL

## 2014-09-15 MED ORDER — SODIUM CHLORIDE 0.9 % IJ SOLN
10.0000 mL | INTRAMUSCULAR | Status: DC | PRN
  Administered 2014-09-15: 10 mL
  Filled 2014-09-15: qty 10

## 2014-09-15 MED ORDER — IBUPROFEN 200 MG PO TABS
ORAL_TABLET | ORAL | Status: AC
  Filled 2014-09-15: qty 2

## 2014-09-15 MED ORDER — DEXAMETHASONE SODIUM PHOSPHATE 20 MG/5ML IJ SOLN
20.0000 mg | Freq: Once | INTRAMUSCULAR | Status: AC
  Administered 2014-09-15: 20 mg via INTRAVENOUS

## 2014-09-15 MED ORDER — FAMOTIDINE IN NACL 20-0.9 MG/50ML-% IV SOLN
INTRAVENOUS | Status: AC
  Filled 2014-09-15: qty 100

## 2014-09-15 MED ORDER — DIPHENHYDRAMINE HCL 50 MG/ML IJ SOLN
50.0000 mg | Freq: Once | INTRAMUSCULAR | Status: AC
  Administered 2014-09-15: 50 mg via INTRAVENOUS

## 2014-09-15 NOTE — Progress Notes (Signed)
MD aware of labs and is okay to proceed with treatment today.

## 2014-09-15 NOTE — Progress Notes (Signed)
[place]  [phone] [phone]  ID: Megan Tate OB: August 04, 1946 MR#: [id number] NOB#:[id number] Patient Care Team: Sandi Mariscal, MD as PCP - General (Internal Medicine)  DIAGNOSIS: CLL-progressive  INTERVAL HISTORY: [name] is here today for follow-up. Her lymph nodes appear to be going down some. She is feeling better. Her offered to help her and went and bought all her Christmas presents for her so she wouldn't have to get out in the crowds. This was very sweet of her.  She denies fever, chills, n/v, cough, rash, headache, dizziness, SOB, chest pain, palpitations, abdominal pain, constipation, diarrhea, blood in urine or stool.  She has no swelling, tenderness, numbness or tingling in her extremities.  Her appetite is good and she is staying hydrated. Her weight is stable.  Her port a cath has healed nicely and was accessed without any problems.  She has diabetes and checks her blood sugars regularly. She is on insulin for this.    CURRENT TREATMENT: Gazyva/Treanda   REVIEW OF SYSTEMS: All other 10 point review of systems is negative.   PAST MEDICAL HISTORY: Past Medical History  Diagnosis Date  . Diabetes mellitus without complication   . Cancer CLL  . Hypertension   . CLL (chronic lymphocytic leukemia) 08/16/2014    PAST SURGICAL HISTORY: History reviewed. No pertinent past surgical history.  FAMILY HISTORY History reviewed. No pertinent family history.  GYNECOLOGIC HISTORY:  No LMP recorded. Patient is postmenopausal.   SOCIAL HISTORY: History   Social History  . Marital Status: Single    Spouse Name: N/A    Number of Children: N/A  . Years of Education: N/A   Occupational History  . Not on file.   Social History Main Topics  . Smoking status: Never Smoker   . Smokeless tobacco: Never Used     Comment: never used tobacco  . Alcohol Use: Not on file  . Drug Use: Not on file  . Sexual Activity: Not on file   Other Topics  Concern  . Not on file   Social History Narrative    ADVANCED DIRECTIVES:  <no information>  HEALTH MAINTENANCE: History  Substance Use Topics  . Smoking status: Never Smoker   . Smokeless tobacco: Never Used     Comment: never used tobacco  . Alcohol Use: Not on file   Colonoscopy: PAP: Bone density: Lipid panel:  Allergies  Allergen Reactions  . Tylenol [Acetaminophen] Other (See Comments)    Messes with stomach    Current Outpatient Prescriptions  Medication Sig Dispense Refill  . allopurinol (ZYLOPRIM) 100 MG tablet Take 1 tablet (100 mg total) by mouth daily. 30 tablet 0  . cloNIDine (CATAPRES) 0.2 MG tablet Take 0.2 mg by mouth 2 (two) times daily.     Marland Kitchen COLCRYS 0.6 MG tablet Take 0.6 mg by mouth as needed.     . CRESTOR 10 MG tablet Take 10 mg by mouth daily.     . dorzolamide-timolol (COSOPT) 22.3-6.8 MG/ML ophthalmic solution Place 1 drop into both eyes 2 (two) times daily.     Marland Kitchen EDARBI 80 MG TABS Take by mouth every morning.     . famciclovir (FAMVIR) 500 MG tablet Take 1 tablet (500 mg total) by mouth daily. 30 tablet 8  . KOMBIGLYZE XR 2.01-999 MG TB24 Take by mouth every morning.     Marland Kitchen LEVEMIR FLEXTOUCH 100 UNIT/ML Pen Inject 10 Units into the skin every morning.     . lidocaine-prilocaine (EMLA) cream Apply  to Port-A-Cath site 1 hour ride to treatment 30 g 4  . lidocaine-prilocaine (EMLA) cream Apply to affected area once 30 g 3  . LORazepam (ATIVAN) 0.5 MG tablet Take 1 tablet (0.5 mg total) by mouth every 8 (eight) hours. 30 tablet 2  . metoprolol succinate (TOPROL-XL) 100 MG 24 hr tablet Take 100 mg by mouth daily.     . ondansetron (ZOFRAN) 8 MG tablet Take 1 tablet (8 mg total) by mouth 2 (two) times daily. Start the day after chemo for 2 days. Then take as needed for nausea or vomiting. 30 tablet 1  . prochlorperazine (COMPAZINE) 10 MG tablet Take 1 tablet (10 mg total) by mouth every 6 (six) hours as needed (Nausea or vomiting). 30 tablet 1  .  spironolactone (ALDACTONE) 25 MG tablet Take 1 tablet (25 mg total) by mouth once. 90 tablet 3  . sulfamethoxazole-trimethoprim (BACTRIM DS,SEPTRA DS) 800-160 MG per tablet Take 1 tablet by mouth daily. 30 tablet 6  . Vitamin D, Ergocalciferol, (DRISDOL) 50000 UNITS CAPS capsule Take 50,000 Units by mouth every 7 (seven) days. Every thursday     No current facility-administered medications for this visit.    OBJECTIVE: There were no vitals filed for this visit. There were no vitals filed for this visit. ECOG FS:0 - Asymptomatic Ocular: Sclerae unicteric, pupils equal, round and reactive to light Ear-nose-throat: Oropharynx clear, dentition fair Lymphatic: No cervical or supraclavicular adenopathy Lungs no rales or rhonchi, good excursion bilaterally Heart regular rate and rhythm, no murmur appreciated Abd soft, nontender, positive bowel sounds MSK no focal spinal tenderness, no joint edema Neuro: non-focal, well-oriented, appropriate affect Breasts: Deferred  LAB RESULTS: CMP     Component Value Date/Time   NA 136 09/08/2014 0923   NA 138 06/16/2014 1102   K 5.0* 09/08/2014 0923   K 4.6 06/16/2014 1102   CL 95* 09/08/2014 0923   CL 104 06/16/2014 1102   CO2 28 09/08/2014 0923   CO2 28 06/16/2014 1102   GLUCOSE 411* 09/08/2014 0923   GLUCOSE 133* 06/16/2014 1102   BUN 31* 09/08/2014 0923   BUN 17 06/16/2014 1102   CREATININE 1.4* 09/08/2014 0923   CREATININE 1.22* 06/16/2014 1102   CALCIUM 11.0* 09/08/2014 0923   CALCIUM 10.3 06/16/2014 1102   PROT 6.9 09/08/2014 0923   PROT 6.3 06/16/2014 1102   ALBUMIN 4.0 06/16/2014 1102   AST 13 09/08/2014 0923   AST 15 06/16/2014 1102   ALT 18 09/08/2014 0923   ALT 14 06/16/2014 1102   ALKPHOS 79 09/08/2014 0923   ALKPHOS 78 06/16/2014 1102   BILITOT 1.00 09/08/2014 0923   BILITOT 0.4 06/16/2014 1102   INo results found for: SPEP, UPEP Lab Results  Component Value Date   WBC 1.6* 09/15/2014   NEUTROABS 0.7* 09/15/2014   HGB  9.8* 09/15/2014   HCT 29.4* 09/15/2014   MCV 70* 09/15/2014   PLT 164 09/15/2014   No results found for: LABCA2 No components found for: [id number] No results for input(s): INR in the last 168 hours. Urinalysis No results found for: COLORURINE, APPEARANCEUR, LABSPEC, PHURINE, GLUCOSEU, HGBUR, BILIRUBINUR, KETONESUR, PROTEINUR, UROBILINOGEN, NITRITE, LEUKOCYTESUR  STUDIES: None   ASSESSMENT/PLAN: Ms. Holzworth is 68 year old African [place] female with CLL. Her WBC are improved at 1.6 today. Lymphocytes are down to 16.6%. She has had no problems with infections. Her lymph nodes have gone down some and she is feeling much better.  She will get [facility] today as planned.  She will have Cycle  2 of Treanda on December 30th.  She is taking her Famvir, Allopurinol and Bactrim as prescribed for prophylaxis.  She has her appointment and treatment schedule.  She knows to call her with any questions or concerns and to go to the ED in the event of an emergency. We can certainly see her sooner if need be.   Eliezer Bottom, NP 09/15/2014 10:13 AM

## 2014-09-15 NOTE — Patient Instructions (Signed)
Obinutuzumab injection What is this medicine? OBINUTUZUMAB (OH bi nue TOOZ ue mab) is a monoclonal antibody. It is used to treat chronic lymphocytic leukemia. This drug targets a specific protein on cancer cells and stops them from growing. This medicine may be used for other purposes; ask your health care provider or pharmacist if you have questions. COMMON BRAND NAME(S): GAZYVA What should I tell my health care provider before I take this medicine? They need to know if you have any of these conditions: -hepatitis -low blood counts, like low white cell, platelet, or red cell counts -lung or breathing disease -heart disease -an unusual or allergic reaction to ofatumumab, other medicines, foods, dyes, or preservatives -pregnant or trying to get pregnant -breast-feeding How should I use this medicine? This medicine is for infusion into a vein. It is given by a health care professional in a hospital or clinic setting. Talk to your pediatrician regarding the use of this medicine in children. Special care may be needed. Overdosage: If you think you've taken too much of this medicine contact a poison control center or emergency room at once. Overdosage: If you think you have taken too much of this medicine contact a poison control center or emergency room at once. NOTE: This medicine is only for you. Do not share this medicine with others. What if I miss a dose? Keep appointments for follow-up doses as directed. It is important not to miss your dose. Call your doctor or health care professional if you are unable to keep an appointment. What may interact with this medicine? -live virus vaccines This list may not describe all possible interactions. Give your health care provider a list of all the medicines, herbs, non-prescription drugs, or dietary supplements you use. Also tell them if you smoke, drink alcohol, or use illegal drugs. Some items may interact with your medicine. What should I watch  for while using this medicine? Report any side effects that you notice during your treatment right away, such as changes in your breathing, fever, chills, dizziness or lightheadedness. These effects are more common with the first dose. Visit your prescriber or health care professional for checks on your progress. You will need to have regular blood work. Report any other side effects. The side effects of this medicine can continue after you finish your treatment. Continue your course of treatment even though you feel ill unless your doctor tells you to stop. Call your doctor or health care professional for advice if you get a fever, chills or sore throat, or other symptoms of a cold or flu. Do not treat yourself. This drug decreases your body's ability to fight infections. Try to avoid being around people who are sick. This medicine may increase your risk to bruise or bleed. Call your doctor or health care professional if you notice any unusual bleeding. Be careful brushing and flossing your teeth or using a toothpick because you may get an infection or bleed more easily. If you have any dental work done, tell your dentist you are receiving this medicine. Avoid taking products that contain aspirin, acetaminophen, ibuprofen, naproxen, or ketoprofen unless instructed by your doctor. These medicines may hide a fever. Do not become pregnant while taking this medicine. Women should inform their doctor if they wish to become pregnant or think they might be pregnant. There is a potential for serious side effects to an unborn child. Talk to your health care professional or pharmacist for more information. Do not breast-feed an infant while taking this   medicine. What side effects may I notice from receiving this medicine? Side effects that you should report to your doctor or health care professional as soon as possible: -allergic reactions like skin rash, itching or hives, swelling of the face, lips, or  tongue -breathing problems -changes in vision -chest pain or chest tightness -chills -confusion, trouble speaking or understanding -cough -diarrhea -dizziness -fainting spells -fever -general ill feeling or flu-like symptoms -lightheadedness -loss of balance or coordination -nausea, vomiting -right upper belly pain -trouble walking -unusual bleeding or bruising -unusually weak or tired -yellowing of the eyes or skin Side effects that usually do not require medical attention (Report these to your doctor or health care professional if they continue or are bothersome.): -muscle pain -muscle cramps This list may not describe all possible side effects. Call your doctor for medical advice about side effects. You may report side effects to FDA at 1-800-FDA-1088. Where should I keep my medicine? This drug is only given in a hospital or clinic and will not be stored at home. NOTE: This sheet is a summary. It may not cover all possible information. If you have questions about this medicine, talk to your doctor, pharmacist, or health care provider.  2015, [employer] Standard. (2012-08-07 18:24:29)  

## 2014-09-20 ENCOUNTER — Ambulatory Visit (HOSPITAL_BASED_OUTPATIENT_CLINIC_OR_DEPARTMENT_OTHER): Payer: Medicare Other | Admitting: Lab

## 2014-09-20 ENCOUNTER — Other Ambulatory Visit: Payer: Self-pay | Admitting: Family

## 2014-09-20 DIAGNOSIS — C9110 Chronic lymphocytic leukemia of B-cell type not having achieved remission: Secondary | ICD-10-CM

## 2014-09-20 DIAGNOSIS — B373 Candidiasis of vulva and vagina: Secondary | ICD-10-CM | POA: Insufficient documentation

## 2014-09-20 DIAGNOSIS — B3731 Acute candidiasis of vulva and vagina: Secondary | ICD-10-CM

## 2014-09-20 LAB — CBC WITH DIFFERENTIAL (CANCER CENTER ONLY)
BASO#: 0 10*3/uL (ref 0.0–0.2)
BASO%: 0 % (ref 0.0–2.0)
EOS%: 1.2 % (ref 0.0–7.0)
Eosinophils Absolute: 0 10*3/uL (ref 0.0–0.5)
HCT: 30 % — ABNORMAL LOW (ref 34.8–46.6)
HEMOGLOBIN: 9.7 g/dL — AB (ref 11.6–15.9)
LYMPH#: 0.4 10*3/uL — ABNORMAL LOW (ref 0.9–3.3)
LYMPH%: 26.5 % (ref 14.0–48.0)
MCH: 23.2 pg — AB (ref 26.0–34.0)
MCHC: 32.3 g/dL (ref 32.0–36.0)
MCV: 72 fL — AB (ref 81–101)
MONO#: 0.4 10*3/uL (ref 0.1–0.9)
MONO%: 22.3 % — AB (ref 0.0–13.0)
NEUT#: 0.8 10*3/uL — ABNORMAL LOW (ref 1.5–6.5)
NEUT%: 50 % (ref 39.6–80.0)
Platelets: 84 10*3/uL — ABNORMAL LOW (ref 145–400)
RBC: 4.18 10*6/uL (ref 3.70–5.32)
RDW: 14.9 % (ref 11.1–15.7)
WBC: 1.7 10*3/uL — ABNORMAL LOW (ref 3.9–10.0)

## 2014-09-20 LAB — CMP (CANCER CENTER ONLY)
ALT: 18 U/L (ref 10–47)
AST: 18 U/L (ref 11–38)
Albumin: 3.2 g/dL — ABNORMAL LOW (ref 3.3–5.5)
Alkaline Phosphatase: 86 U/L — ABNORMAL HIGH (ref 26–84)
BILIRUBIN TOTAL: 0.6 mg/dL (ref 0.20–1.60)
BUN, Bld: 8 mg/dL (ref 7–22)
CALCIUM: 10 mg/dL (ref 8.0–10.3)
CHLORIDE: 102 meq/L (ref 98–108)
CO2: 29 mEq/L (ref 18–33)
CREATININE: 0.8 mg/dL (ref 0.6–1.2)
Glucose, Bld: 223 mg/dL — ABNORMAL HIGH (ref 73–118)
Potassium: 3.9 mEq/L (ref 3.3–4.7)
Sodium: 139 mEq/L (ref 128–145)
Total Protein: 5.8 g/dL — ABNORMAL LOW (ref 6.4–8.1)

## 2014-09-20 MED ORDER — FLUCONAZOLE 200 MG PO TABS: 200.0000 mg | ORAL_TABLET | Freq: Every day | ORAL | Status: DC

## 2014-09-21 ENCOUNTER — Other Ambulatory Visit: Payer: Self-pay | Admitting: Hematology & Oncology

## 2014-09-29 ENCOUNTER — Ambulatory Visit: Payer: Medicare Other

## 2014-09-29 ENCOUNTER — Ambulatory Visit: Payer: Medicare Other | Admitting: Hematology & Oncology

## 2014-09-29 ENCOUNTER — Other Ambulatory Visit: Payer: Medicare Other | Admitting: Lab

## 2014-09-30 ENCOUNTER — Ambulatory Visit: Payer: Medicare Other

## 2014-10-06 ENCOUNTER — Ambulatory Visit (HOSPITAL_BASED_OUTPATIENT_CLINIC_OR_DEPARTMENT_OTHER): Payer: Medicare Other | Admitting: Hematology & Oncology

## 2014-10-06 ENCOUNTER — Encounter: Payer: Self-pay | Admitting: Hematology & Oncology

## 2014-10-06 ENCOUNTER — Ambulatory Visit (HOSPITAL_BASED_OUTPATIENT_CLINIC_OR_DEPARTMENT_OTHER): Payer: Medicare Other

## 2014-10-06 ENCOUNTER — Other Ambulatory Visit (HOSPITAL_BASED_OUTPATIENT_CLINIC_OR_DEPARTMENT_OTHER): Payer: Medicare Other | Admitting: Lab

## 2014-10-06 VITALS — BP 208/87 | HR 76 | Temp 98.5°F | Resp 16 | Ht 64.0 in | Wt 208.0 lb

## 2014-10-06 DIAGNOSIS — Z5111 Encounter for antineoplastic chemotherapy: Secondary | ICD-10-CM | POA: Diagnosis not present

## 2014-10-06 DIAGNOSIS — Z5112 Encounter for antineoplastic immunotherapy: Secondary | ICD-10-CM | POA: Diagnosis present

## 2014-10-06 DIAGNOSIS — C9110 Chronic lymphocytic leukemia of B-cell type not having achieved remission: Secondary | ICD-10-CM

## 2014-10-06 LAB — CBC WITH DIFFERENTIAL (CANCER CENTER ONLY)
BASO#: 0 10*3/uL (ref 0.0–0.2)
BASO%: 0.3 % (ref 0.0–2.0)
EOS ABS: 0 10*3/uL (ref 0.0–0.5)
EOS%: 1.1 % (ref 0.0–7.0)
HEMATOCRIT: 35.6 % (ref 34.8–46.6)
HEMOGLOBIN: 11.6 g/dL (ref 11.6–15.9)
LYMPH#: 2.5 10*3/uL (ref 0.9–3.3)
LYMPH%: 66 % — ABNORMAL HIGH (ref 14.0–48.0)
MCH: 24 pg — ABNORMAL LOW (ref 26.0–34.0)
MCHC: 32.6 g/dL (ref 32.0–36.0)
MCV: 74 fL — ABNORMAL LOW (ref 81–101)
MONO#: 0.8 10*3/uL (ref 0.1–0.9)
MONO%: 20.8 % — AB (ref 0.0–13.0)
NEUT%: 11.8 % — ABNORMAL LOW (ref 39.6–80.0)
NEUTROS ABS: 0.4 10*3/uL — AB (ref 1.5–6.5)
PLATELETS: 304 10*3/uL (ref 145–400)
RBC: 4.84 10*6/uL (ref 3.70–5.32)
RDW: 16.4 % — ABNORMAL HIGH (ref 11.1–15.7)
WBC: 3.7 10*3/uL — ABNORMAL LOW (ref 3.9–10.0)

## 2014-10-06 LAB — BASIC METABOLIC PANEL - CANCER CENTER ONLY
BUN, Bld: 6 mg/dL — ABNORMAL LOW (ref 7–22)
CALCIUM: 10.7 mg/dL — AB (ref 8.0–10.3)
CO2: 29 mEq/L (ref 18–33)
CREATININE: 1.1 mg/dL (ref 0.6–1.2)
Chloride: 107 mEq/L (ref 98–108)
Glucose, Bld: 117 mg/dL (ref 73–118)
Potassium: 3.7 mEq/L (ref 3.3–4.7)
SODIUM: 142 meq/L (ref 128–145)

## 2014-10-06 MED ORDER — FAMOTIDINE IN NACL 20-0.9 MG/50ML-% IV SOLN
INTRAVENOUS | Status: AC
  Filled 2014-10-06: qty 100

## 2014-10-06 MED ORDER — SODIUM CHLORIDE 0.9 % IV SOLN: Freq: Once | INTRAVENOUS | Status: DC

## 2014-10-06 MED ORDER — SODIUM CHLORIDE 0.9 % IV SOLN
1000.0000 mg | Freq: Once | INTRAVENOUS | Status: AC
  Administered 2014-10-06: 1000 mg via INTRAVENOUS
  Filled 2014-10-06: qty 40

## 2014-10-06 MED ORDER — DEXAMETHASONE SODIUM PHOSPHATE 20 MG/5ML IJ SOLN
20.0000 mg | Freq: Once | INTRAMUSCULAR | Status: AC
  Administered 2014-10-06: 20 mg via INTRAVENOUS

## 2014-10-06 MED ORDER — ONDANSETRON 8 MG/50ML IVPB (CHCC)
8.0000 mg | Freq: Once | INTRAVENOUS | Status: AC
  Administered 2014-10-06: 8 mg via INTRAVENOUS

## 2014-10-06 MED ORDER — HEPARIN SOD (PORK) LOCK FLUSH 100 UNIT/ML IV SOLN
500.0000 [IU] | Freq: Once | INTRAVENOUS | Status: AC | PRN
  Administered 2014-10-06: 500 [IU]
  Filled 2014-10-06: qty 5

## 2014-10-06 MED ORDER — IBUPROFEN 200 MG PO TABS
400.0000 mg | ORAL_TABLET | Freq: Once | ORAL | Status: AC
  Administered 2014-10-06: 400 mg via ORAL

## 2014-10-06 MED ORDER — DIPHENHYDRAMINE HCL 50 MG/ML IJ SOLN
50.0000 mg | Freq: Once | INTRAMUSCULAR | Status: AC
  Administered 2014-10-06: 50 mg via INTRAVENOUS

## 2014-10-06 MED ORDER — SODIUM CHLORIDE 0.9 % IJ SOLN
10.0000 mL | INTRAMUSCULAR | Status: DC | PRN
  Administered 2014-10-06: 10 mL
  Filled 2014-10-06: qty 10

## 2014-10-06 MED ORDER — SODIUM CHLORIDE 0.9 % IV SOLN
Freq: Once | INTRAVENOUS | Status: AC
  Administered 2014-10-06: 11:00:00 via INTRAVENOUS

## 2014-10-06 MED ORDER — FAMOTIDINE IN NACL 20-0.9 MG/50ML-% IV SOLN
40.0000 mg | Freq: Once | INTRAVENOUS | Status: AC
  Administered 2014-10-06: 40 mg via INTRAVENOUS

## 2014-10-06 MED ORDER — DIPHENHYDRAMINE HCL 50 MG/ML IJ SOLN
INTRAMUSCULAR | Status: AC
  Filled 2014-10-06: qty 1

## 2014-10-06 MED ORDER — IBUPROFEN 200 MG PO TABS
ORAL_TABLET | ORAL | Status: AC
  Filled 2014-10-06: qty 2

## 2014-10-06 MED ORDER — DEXAMETHASONE SODIUM PHOSPHATE 20 MG/5ML IJ SOLN
INTRAMUSCULAR | Status: AC
  Filled 2014-10-06: qty 5

## 2014-10-06 MED ORDER — SODIUM CHLORIDE 0.9 % IV SOLN
100.0000 mg/m2 | Freq: Once | INTRAVENOUS | Status: AC
  Administered 2014-10-06: 225 mg via INTRAVENOUS
  Filled 2014-10-06: qty 2.5

## 2014-10-06 NOTE — Patient Instructions (Signed)
Bendamustine Injection What is this medicine? BENDAMUSTINE (BEN da MUS teen) is a chemotherapy drug. It is used to treat chronic lymphocytic leukemia and non-Hodgkin lymphoma. This medicine may be used for other purposes; ask your health care provider or pharmacist if you have questions. COMMON BRAND NAME(S): Treanda What should I tell my health care provider before I take this medicine? They need to know if you have any of these conditions: -kidney disease -liver disease -an unusual or allergic reaction to bendamustine, mannitol, other medicines, foods, dyes, or preservatives -pregnant or trying to get pregnant -breast-feeding How should I use this medicine? This medicine is for infusion into a vein. It is given by a health care professional in a hospital or clinic setting. Talk to your pediatrician regarding the use of this medicine in children. Special care may be needed. Overdosage: If you think you have taken too much of this medicine contact a poison control center or emergency room at once. NOTE: This medicine is only for you. Do not share this medicine with others. What if I miss a dose? It is important not to miss your dose. Call your doctor or health care professional if you are unable to keep an appointment. What may interact with this medicine? Do not take this medicine with any of the following medications: -clozapine This medicine may also interact with the following medications: -atazanavir -cimetidine -ciprofloxacin -enoxacin -fluvoxamine -medicines for seizures like carbamazepine and phenobarbital -mexiletine -rifampin -tacrine -thiabendazole -zileuton This list may not describe all possible interactions. Give your health care provider a list of all the medicines, herbs, non-prescription drugs, or dietary supplements you use. Also tell them if you smoke, drink alcohol, or use illegal drugs. Some items may interact with your medicine. What should I watch for while  using this medicine? Your condition will be monitored carefully while you are receiving this medicine. This drug may make you feel generally unwell. This is not uncommon, as chemotherapy can affect healthy cells as well as cancer cells. Report any side effects. Continue your course of treatment even though you feel ill unless your doctor tells you to stop. Call your doctor or health care professional for advice if you get a fever, chills or sore throat, or other symptoms of a cold or flu. Do not treat yourself. This drug decreases your body's ability to fight infections. Try to avoid being around people who are sick. This medicine may increase your risk to bruise or bleed. Call your doctor or health care professional if you notice any unusual bleeding. Be careful brushing and flossing your teeth or using a toothpick because you may get an infection or bleed more easily. If you have any dental work done, tell your dentist you are receiving this medicine. Avoid taking products that contain aspirin, acetaminophen, ibuprofen, naproxen, or ketoprofen unless instructed by your doctor. These medicines may hide a fever. Do not become pregnant while taking this medicine. Women should inform their doctor if they wish to become pregnant or think they might be pregnant. There is a potential for serious side effects to an unborn child. Men should inform their doctors if they wish to father a child. This medicine may lower sperm counts. Talk to your health care professional or pharmacist for more information. Do not breast-feed an infant while taking this medicine. What side effects may I notice from receiving this medicine? Side effects that you should report to your doctor or health care professional as soon as possible: -allergic reactions like skin rash,  itching or hives, swelling of the face, lips, or tongue -low blood counts - this medicine may decrease the number of white blood cells, red blood cells and  platelets. You may be at increased risk for infections and bleeding. -signs of infection - fever or chills, cough, sore throat, pain or difficulty passing urine -signs of decreased platelets or bleeding - bruising, pinpoint red spots on the skin, black, tarry stools, blood in the urine -signs of decreased red blood cells - unusually weak or tired, fainting spells, lightheadedness -trouble passing urine or change in the amount of urine Side effects that usually do not require medical attention (report to your doctor or health care professional if they continue or are bothersome): -diarrhea This list may not describe all possible side effects. Call your doctor for medical advice about side effects. You may report side effects to FDA at 1-800-FDA-1088. Where should I keep my medicine? This drug is given in a hospital or clinic and will not be stored at home. NOTE: This sheet is a summary. It may not cover all possible information. If you have questions about this medicine, talk to your doctor, pharmacist, or health care provider.  2015, Elsevier/Gold Standard. (2011-12-14 14:15:47) Obinutuzumab injection What is this medicine? OBINUTUZUMAB (OH bi nue TOOZ ue mab) is a monoclonal antibody. It is used to treat chronic lymphocytic leukemia. This drug targets a specific protein on cancer cells and stops them from growing. This medicine may be used for other purposes; ask your health care provider or pharmacist if you have questions. COMMON BRAND NAME(S): GAZYVA What should I tell my health care provider before I take this medicine? They need to know if you have any of these conditions: -hepatitis -low blood counts, like low white cell, platelet, or red cell counts -lung or breathing disease -heart disease -an unusual or allergic reaction to ofatumumab, other medicines, foods, dyes, or preservatives -pregnant or trying to get pregnant -breast-feeding How should I use this medicine? This medicine  is for infusion into a vein. It is given by a health care professional in a hospital or clinic setting. Talk to your pediatrician regarding the use of this medicine in children. Special care may be needed. Overdosage: If you think you've taken too much of this medicine contact a poison control center or emergency room at once. Overdosage: If you think you have taken too much of this medicine contact a poison control center or emergency room at once. NOTE: This medicine is only for you. Do not share this medicine with others. What if I miss a dose? Keep appointments for follow-up doses as directed. It is important not to miss your dose. Call your doctor or health care professional if you are unable to keep an appointment. What may interact with this medicine? -live virus vaccines This list may not describe all possible interactions. Give your health care provider a list of all the medicines, herbs, non-prescription drugs, or dietary supplements you use. Also tell them if you smoke, drink alcohol, or use illegal drugs. Some items may interact with your medicine. What should I watch for while using this medicine? Report any side effects that you notice during your treatment right away, such as changes in your breathing, fever, chills, dizziness or lightheadedness. These effects are more common with the first dose. Visit your prescriber or health care professional for checks on your progress. You will need to have regular blood work. Report any other side effects. The side effects of this medicine can  continue after you finish your treatment. Continue your course of treatment even though you feel ill unless your doctor tells you to stop. Call your doctor or health care professional for advice if you get a fever, chills or sore throat, or other symptoms of a cold or flu. Do not treat yourself. This drug decreases your body's ability to fight infections. Try to avoid being around people who are sick. This  medicine may increase your risk to bruise or bleed. Call your doctor or health care professional if you notice any unusual bleeding. Be careful brushing and flossing your teeth or using a toothpick because you may get an infection or bleed more easily. If you have any dental work done, tell your dentist you are receiving this medicine. Avoid taking products that contain aspirin, acetaminophen, ibuprofen, naproxen, or ketoprofen unless instructed by your doctor. These medicines may hide a fever. Do not become pregnant while taking this medicine. Women should inform their doctor if they wish to become pregnant or think they might be pregnant. There is a potential for serious side effects to an unborn child. Talk to your health care professional or pharmacist for more information. Do not breast-feed an infant while taking this medicine. What side effects may I notice from receiving this medicine? Side effects that you should report to your doctor or health care professional as soon as possible: -allergic reactions like skin rash, itching or hives, swelling of the face, lips, or tongue -breathing problems -changes in vision -chest pain or chest tightness -chills -confusion, trouble speaking or understanding -cough -diarrhea -dizziness -fainting spells -fever -general ill feeling or flu-like symptoms -lightheadedness -loss of balance or coordination -nausea, vomiting -right upper belly pain -trouble walking -unusual bleeding or bruising -unusually weak or tired -yellowing of the eyes or skin Side effects that usually do not require medical attention (Report these to your doctor or health care professional if they continue or are bothersome.): -muscle pain -muscle cramps This list may not describe all possible side effects. Call your doctor for medical advice about side effects. You may report side effects to FDA at 1-800-FDA-1088. Where should I keep my medicine? This drug is only given in  a hospital or clinic and will not be stored at home. NOTE: This sheet is a summary. It may not cover all possible information. If you have questions about this medicine, talk to your doctor, pharmacist, or health care provider.  2015, Elsevier/Gold Standard. (2012-08-07 18:24:29) [facility] Discharge Instructions for Patients Receiving Chemotherapy  Today you received the following chemotherapy agents:  Treanda and Dyann Kief  To help prevent nausea and vomiting after your treatment, we encourage you to take your nausea medication as directed on the bottle.  They have been called into your pharmacy.   If you develop nausea and vomiting that is not controlled by your nausea medication, call the clinic.   BELOW ARE SYMPTOMS THAT SHOULD BE REPORTED IMMEDIATELY:  *FEVER GREATER THAN 100.5 F  *CHILLS WITH OR WITHOUT FEVER  NAUSEA AND VOMITING THAT IS NOT CONTROLLED WITH YOUR NAUSEA MEDICATION  *UNUSUAL SHORTNESS OF BREATH  *UNUSUAL BRUISING OR BLEEDING  TENDERNESS IN MOUTH AND THROAT WITH OR WITHOUT PRESENCE OF ULCERS  *URINARY PROBLEMS  *BOWEL PROBLEMS  UNUSUAL RASH Items with * indicate a potential emergency and should be followed up as soon as possible.  Feel free to call the clinic you have any questions or concerns. The clinic phone number is [phone].

## 2014-10-06 NOTE — Progress Notes (Signed)
Dr. Marin Olp aware of labs, verbal order to treat today.

## 2014-10-06 NOTE — Progress Notes (Signed)
Hematology and Oncology Follow Up Visit  Megan Tate [id number] 1946/04/02 69 y.o. 10/06/2014   Principle Diagnosis:   Chronic lymphocytic leukemia- Trisomy 12  Current Therapy:    Status post 2 cycles of Gazyva/Endo mustine     Interim History:  [name] is back for follow-up. She really looks great. All of her lymph nodes have resolved. She got her treatment without any problems.  She's had no nausea vomiting. There's been no headache. She's had no fever. She's had no change in bowel or bladder habits. Never blood sugars are doing much better right now.  She's had no rashes. His been no leg swelling.  Overall, her performance status is ECOG 1.  Medications: Current outpatient prescriptions: allopurinol (ZYLOPRIM) 100 MG tablet, TAKE ONE TABLET BY MOUTH ONE TIME DAILY , Disp: 30 tablet, Rfl: 4;  cloNIDine (CATAPRES) 0.2 MG tablet, Take 0.2 mg by mouth 2 (two) times daily. , Disp: , Rfl: ;  COLCRYS 0.6 MG tablet, Take 0.6 mg by mouth as needed. , Disp: , Rfl: ;  CRESTOR 10 MG tablet, Take 10 mg by mouth daily. , Disp: , Rfl:  dorzolamide-timolol (COSOPT) 22.3-6.8 MG/ML ophthalmic solution, Place 1 drop into both eyes 2 (two) times daily. , Disp: , Rfl: ;  EDARBI 80 MG TABS, Take by mouth every morning. , Disp: , Rfl: ;  famciclovir (FAMVIR) 500 MG tablet, Take 1 tablet (500 mg total) by mouth daily., Disp: 30 tablet, Rfl: 8;  fluconazole (DIFLUCAN) 200 MG tablet, Take 1 tablet (200 mg total) by mouth daily., Disp: 5 tablet, Rfl: 0 KOMBIGLYZE XR 2.01-999 MG TB24, Take by mouth every morning. , Disp: , Rfl: ;  LEVEMIR FLEXTOUCH 100 UNIT/ML Pen, Inject 10 Units into the skin every morning. , Disp: , Rfl: ;  lidocaine-prilocaine (EMLA) cream, Apply to Port-A-Cath site 1 hour ride to treatment, Disp: 30 g, Rfl: 4;  LORazepam (ATIVAN) 0.5 MG tablet, Take 1 tablet (0.5 mg total) by mouth every 8 (eight) hours., Disp: 30 tablet, Rfl: 2 metoprolol succinate (TOPROL-XL) 100 MG 24 hr tablet, Take  100 mg by mouth daily. , Disp: , Rfl: ;  ondansetron (ZOFRAN) 8 MG tablet, Take 1 tablet (8 mg total) by mouth 2 (two) times daily. Start the day after chemo for 2 days. Then take as needed for nausea or vomiting., Disp: 30 tablet, Rfl: 1;  spironolactone (ALDACTONE) 25 MG tablet, Take 1 tablet (25 mg total) by mouth once., Disp: 90 tablet, Rfl: 3 Vitamin D, Ergocalciferol, (DRISDOL) 50000 UNITS CAPS capsule, Take 50,000 Units by mouth every 7 (seven) days. Every thursday, Disp: , Rfl: ;  prochlorperazine (COMPAZINE) 10 MG tablet, Take 1 tablet (10 mg total) by mouth every 6 (six) hours as needed (Nausea or vomiting). (Patient not taking: Reported on 10/06/2014), Disp: 30 tablet, Rfl: 1  Allergies:  Allergies  Allergen Reactions  . Tylenol [Acetaminophen] Other (See Comments)    Messes with stomach    Past Medical History, Surgical history, Social history, and Family History were reviewed and updated.  Review of Systems: As above  Physical Exam:  height is 5\' 4"  (1.626 m) and weight is 208 lb (94.348 kg). Her oral temperature is 98.5 F (36.9 C). Her blood pressure is 208/87 and her pulse is 76. Her respiration is 16.   Well-developed and well-nourished African-American female. She is mildly obese. Head and neck exam shows no cervical or subclavicular lymph nodes. There is no mucositis. She has no scleral icterus. Lungs are clear.  Cardiac exam regular rate and rhythm with no murmurs, rubs or bruits. Axillary exam shows no axillary adenopathy. Abdomen is soft. She Miley obese. She has good bowel sounds. There is no palpable liver or spleen tip. Back exam shows no tenderness over the spine, ribs or hips. External he shows no clubbing, cyanosis or edema. Skin exam no rashes, ecchymoses or petechia.  Lab Results  Component Value Date   WBC 3.7* 10/06/2014   HGB 11.6 10/06/2014   HCT 35.6 10/06/2014   MCV 74* 10/06/2014   PLT 304 10/06/2014     Chemistry      Component Value Date/Time   NA  142 10/06/2014 0906   NA 138 06/16/2014 1102   K 3.7 10/06/2014 0906   K 4.6 06/16/2014 1102   CL 107 10/06/2014 0906   CL 104 06/16/2014 1102   CO2 29 10/06/2014 0906   CO2 28 06/16/2014 1102   BUN 6* 10/06/2014 0906   BUN 17 06/16/2014 1102   CREATININE 1.1 10/06/2014 0906   CREATININE 1.22* 06/16/2014 1102      Component Value Date/Time   CALCIUM 10.7* 10/06/2014 0906   CALCIUM 10.3 06/16/2014 1102   ALKPHOS 86* 09/20/2014 1002   ALKPHOS 78 06/16/2014 1102   AST 18 09/20/2014 1002   AST 15 06/16/2014 1102   ALT 18 09/20/2014 1002   ALT 14 06/16/2014 1102   BILITOT 0.60 09/20/2014 1002   BILITOT 0.4 06/16/2014 1102         Impression and Plan: Megan Tate is 69 year old African-American female with chronic lymphocytic leukemia. We had to treat her as her white cell count was going up and her lymphadenopathy was worsening.  She's had a fantastic response. I will think that given that she has the trisomy 12 chromosomal abnormalities, but this should be a better marker for her.  Even though she is somewhat neutropenic, she is asymptomatic. Her monocytes are up. We will proceed with therapy.  At some point, we probably need to consider a repeat CT scan to assess for response to treatment with her lymphadenopathy. However, since she is doing well clinically, I don't see that a CT scan is going to really help [place].  I spent about 25-30 minutes with she and her husband. I went over her labs and one over the plan that we have for her.  We'll get her back in one month.  I think she probably will need 6 cycles of therapy.   Volanda Napoleon, MD 1/6/201610:13 AM

## 2014-10-07 ENCOUNTER — Ambulatory Visit (HOSPITAL_BASED_OUTPATIENT_CLINIC_OR_DEPARTMENT_OTHER): Payer: Medicare Other

## 2014-10-07 DIAGNOSIS — C9110 Chronic lymphocytic leukemia of B-cell type not having achieved remission: Secondary | ICD-10-CM

## 2014-10-07 MED ORDER — HEPARIN SOD (PORK) LOCK FLUSH 100 UNIT/ML IV SOLN
500.0000 [IU] | Freq: Once | INTRAVENOUS | Status: AC | PRN
  Administered 2014-10-07: 500 [IU]
  Filled 2014-10-07: qty 5

## 2014-10-07 MED ORDER — DEXAMETHASONE SODIUM PHOSPHATE 10 MG/ML IJ SOLN
10.0000 mg | Freq: Once | INTRAMUSCULAR | Status: AC
  Administered 2014-10-07: 10 mg via INTRAVENOUS

## 2014-10-07 MED ORDER — SODIUM CHLORIDE 0.9 % IJ SOLN
10.0000 mL | INTRAMUSCULAR | Status: DC | PRN
  Administered 2014-10-07: 10 mL
  Filled 2014-10-07: qty 10

## 2014-10-07 MED ORDER — SODIUM CHLORIDE 0.9 % IV SOLN
Freq: Once | INTRAVENOUS | Status: AC
  Administered 2014-10-07: 10:00:00 via INTRAVENOUS

## 2014-10-07 MED ORDER — ONDANSETRON 8 MG/50ML IVPB (CHCC)
8.0000 mg | Freq: Once | INTRAVENOUS | Status: AC
  Administered 2014-10-07: 8 mg via INTRAVENOUS

## 2014-10-07 MED ORDER — DEXAMETHASONE SODIUM PHOSPHATE 10 MG/ML IJ SOLN
INTRAMUSCULAR | Status: AC
  Filled 2014-10-07: qty 1

## 2014-10-07 MED ORDER — SODIUM CHLORIDE 0.9 % IV SOLN
100.0000 mg/m2 | Freq: Once | INTRAVENOUS | Status: AC
  Administered 2014-10-07: 225 mg via INTRAVENOUS
  Filled 2014-10-07: qty 2.5

## 2014-10-07 NOTE — Patient Instructions (Signed)
Bendamustine Injection What is this medicine? BENDAMUSTINE (BEN da MUS teen) is a chemotherapy drug. It is used to treat chronic lymphocytic leukemia and non-Hodgkin lymphoma. This medicine may be used for other purposes; ask your health care provider or pharmacist if you have questions. COMMON BRAND NAME(S): Treanda What should I tell my health care provider before I take this medicine? They need to know if you have any of these conditions: -kidney disease -liver disease -an unusual or allergic reaction to bendamustine, mannitol, other medicines, foods, dyes, or preservatives -pregnant or trying to get pregnant -breast-feeding How should I use this medicine? This medicine is for infusion into a vein. It is given by a health care professional in a hospital or clinic setting. Talk to your pediatrician regarding the use of this medicine in children. Special care may be needed. Overdosage: If you think you have taken too much of this medicine contact a poison control center or emergency room at once. NOTE: This medicine is only for you. Do not share this medicine with others. What if I miss a dose? It is important not to miss your dose. Call your doctor or health care professional if you are unable to keep an appointment. What may interact with this medicine? Do not take this medicine with any of the following medications: -clozapine This medicine may also interact with the following medications: -atazanavir -cimetidine -ciprofloxacin -enoxacin -fluvoxamine -medicines for seizures like carbamazepine and phenobarbital -mexiletine -rifampin -tacrine -thiabendazole -zileuton This list may not describe all possible interactions. Give your health care provider a list of all the medicines, herbs, non-prescription drugs, or dietary supplements you use. Also tell them if you smoke, drink alcohol, or use illegal drugs. Some items may interact with your medicine. What should I watch for while  using this medicine? Your condition will be monitored carefully while you are receiving this medicine. This drug may make you feel generally unwell. This is not uncommon, as chemotherapy can affect healthy cells as well as cancer cells. Report any side effects. Continue your course of treatment even though you feel ill unless your doctor tells you to stop. Call your doctor or health care professional for advice if you get a fever, chills or sore throat, or other symptoms of a cold or flu. Do not treat yourself. This drug decreases your body's ability to fight infections. Try to avoid being around people who are sick. This medicine may increase your risk to bruise or bleed. Call your doctor or health care professional if you notice any unusual bleeding. Be careful brushing and flossing your teeth or using a toothpick because you may get an infection or bleed more easily. If you have any dental work done, tell your dentist you are receiving this medicine. Avoid taking products that contain aspirin, acetaminophen, ibuprofen, naproxen, or ketoprofen unless instructed by your doctor. These medicines may hide a fever. Do not become pregnant while taking this medicine. Women should inform their doctor if they wish to become pregnant or think they might be pregnant. There is a potential for serious side effects to an unborn child. Men should inform their doctors if they wish to father a child. This medicine may lower sperm counts. Talk to your health care professional or pharmacist for more information. Do not breast-feed an infant while taking this medicine. What side effects may I notice from receiving this medicine? Side effects that you should report to your doctor or health care professional as soon as possible: -allergic reactions like skin rash,   itching or hives, swelling of the face, lips, or tongue -low blood counts - this medicine may decrease the number of white blood cells, red blood cells and  platelets. You may be at increased risk for infections and bleeding. -signs of infection - fever or chills, cough, sore throat, pain or difficulty passing urine -signs of decreased platelets or bleeding - bruising, pinpoint red spots on the skin, black, tarry stools, blood in the urine -signs of decreased red blood cells - unusually weak or tired, fainting spells, lightheadedness -trouble passing urine or change in the amount of urine Side effects that usually do not require medical attention (report to your doctor or health care professional if they continue or are bothersome): -diarrhea This list may not describe all possible side effects. Call your doctor for medical advice about side effects. You may report side effects to FDA at 1-800-FDA-1088. Where should I keep my medicine? This drug is given in a hospital or clinic and will not be stored at home. NOTE: This sheet is a summary. It may not cover all possible information. If you have questions about this medicine, talk to your doctor, pharmacist, or health care provider.  2015, [employer]. (2011-12-14 14:15:47)  

## 2014-10-11 ENCOUNTER — Other Ambulatory Visit: Payer: Self-pay | Admitting: *Deleted

## 2014-10-11 ENCOUNTER — Telehealth: Payer: Self-pay | Admitting: *Deleted

## 2014-10-11 DIAGNOSIS — C9110 Chronic lymphocytic leukemia of B-cell type not having achieved remission: Secondary | ICD-10-CM

## 2014-10-11 MED ORDER — BISACODYL 5 MG PO TBEC: 5.0000 mg | DELAYED_RELEASE_TABLET | Freq: Two times a day (BID) | ORAL | Status: DC | PRN

## 2014-10-11 MED ORDER — PROCHLORPERAZINE MALEATE 10 MG PO TABS: 10.0000 mg | ORAL_TABLET | Freq: Four times a day (QID) | ORAL | Status: DC | PRN

## 2014-10-11 NOTE — Telephone Encounter (Signed)
Pt complaining of severe nausea with small amounts of vomiting. She has tried ativan with no relief, and zofran, which she says makes her feel worse. She has not had a BM x 3 days, but not taken anything for this. Per the patient her blood sugars have been in the normal range. Spoke with Dr Marin Olp. Patient told to try compazine. She is unable to find this prescription, so a new one was sent to her pharmacy. Colace also sent for constipation. Told patient to try the compazine as well as the colace to attempt to relieve her constipation, which might also be contributing to her nausea. Patient in agreement with plan.

## 2014-10-20 ENCOUNTER — Other Ambulatory Visit: Payer: Self-pay | Admitting: *Deleted

## 2014-10-20 DIAGNOSIS — C9110 Chronic lymphocytic leukemia of B-cell type not having achieved remission: Secondary | ICD-10-CM

## 2014-10-20 MED ORDER — OXYCODONE HCL 5 MG PO TABS: 5.0000 mg | ORAL_TABLET | Freq: Four times a day (QID) | ORAL | Status: DC | PRN

## 2014-10-20 NOTE — Telephone Encounter (Signed)
Patient c/o pain. Needs something more than the ibuprofen she has been taking.

## 2014-10-27 ENCOUNTER — Other Ambulatory Visit: Payer: Medicare Other | Admitting: Lab

## 2014-10-27 ENCOUNTER — Ambulatory Visit: Payer: Medicare Other

## 2014-11-03 ENCOUNTER — Ambulatory Visit (HOSPITAL_BASED_OUTPATIENT_CLINIC_OR_DEPARTMENT_OTHER): Payer: Medicare Other

## 2014-11-03 ENCOUNTER — Encounter: Payer: Self-pay | Admitting: Hematology & Oncology

## 2014-11-03 ENCOUNTER — Ambulatory Visit (HOSPITAL_BASED_OUTPATIENT_CLINIC_OR_DEPARTMENT_OTHER): Payer: Medicare Other | Admitting: Hematology & Oncology

## 2014-11-03 ENCOUNTER — Other Ambulatory Visit (HOSPITAL_BASED_OUTPATIENT_CLINIC_OR_DEPARTMENT_OTHER): Payer: Medicare Other | Admitting: Lab

## 2014-11-03 VITALS — BP 216/83 | HR 73 | Temp 98.3°F | Resp 16 | Ht 64.0 in | Wt 229.0 lb

## 2014-11-03 DIAGNOSIS — C9110 Chronic lymphocytic leukemia of B-cell type not having achieved remission: Secondary | ICD-10-CM

## 2014-11-03 DIAGNOSIS — Z5111 Encounter for antineoplastic chemotherapy: Secondary | ICD-10-CM

## 2014-11-03 DIAGNOSIS — Z5112 Encounter for antineoplastic immunotherapy: Secondary | ICD-10-CM

## 2014-11-03 LAB — CBC WITH DIFFERENTIAL (CANCER CENTER ONLY)
BASO#: 0.1 10*3/uL (ref 0.0–0.2)
BASO%: 2.4 % — ABNORMAL HIGH (ref 0.0–2.0)
EOS%: 6.4 % (ref 0.0–7.0)
Eosinophils Absolute: 0.3 10*3/uL (ref 0.0–0.5)
HCT: 35.3 % (ref 34.8–46.6)
HGB: 11.6 g/dL (ref 11.6–15.9)
LYMPH#: 2.5 10*3/uL (ref 0.9–3.3)
LYMPH%: 54.3 % — ABNORMAL HIGH (ref 14.0–48.0)
MCH: 23.8 pg — AB (ref 26.0–34.0)
MCHC: 32.9 g/dL (ref 32.0–36.0)
MCV: 72 fL — ABNORMAL LOW (ref 81–101)
MONO#: 0.8 10*3/uL (ref 0.1–0.9)
MONO%: 16.9 % — AB (ref 0.0–13.0)
NEUT#: 0.9 10*3/uL — ABNORMAL LOW (ref 1.5–6.5)
NEUT%: 20 % — AB (ref 39.6–80.0)
Platelets: 86 10*3/uL — ABNORMAL LOW (ref 145–400)
RBC: 4.88 10*6/uL (ref 3.70–5.32)
RDW: 15.2 % (ref 11.1–15.7)
WBC: 4.5 10*3/uL (ref 3.9–10.0)

## 2014-11-03 LAB — CHCC SATELLITE - SMEAR

## 2014-11-03 LAB — CMP (CANCER CENTER ONLY)
ALBUMIN: 3.4 g/dL (ref 3.3–5.5)
ALT(SGPT): 8 U/L — ABNORMAL LOW (ref 10–47)
AST: 16 U/L (ref 11–38)
Alkaline Phosphatase: 61 U/L (ref 26–84)
BILIRUBIN TOTAL: 0.7 mg/dL (ref 0.20–1.60)
BUN: 8 mg/dL (ref 7–22)
CO2: 28 meq/L (ref 18–33)
Calcium: 10.5 mg/dL — ABNORMAL HIGH (ref 8.0–10.3)
Chloride: 103 mEq/L (ref 98–108)
Creat: 0.7 mg/dl (ref 0.6–1.2)
Glucose, Bld: 243 mg/dL — ABNORMAL HIGH (ref 73–118)
POTASSIUM: 3.8 meq/L (ref 3.3–4.7)
Sodium: 135 mEq/L (ref 128–145)
TOTAL PROTEIN: 5.8 g/dL — AB (ref 6.4–8.1)

## 2014-11-03 LAB — LACTATE DEHYDROGENASE: LDH: 166 U/L (ref 94–250)

## 2014-11-03 LAB — TECHNOLOGIST REVIEW CHCC SATELLITE

## 2014-11-03 MED ORDER — FAMOTIDINE IN NACL 20-0.9 MG/50ML-% IV SOLN
40.0000 mg | Freq: Once | INTRAVENOUS | Status: AC
  Administered 2014-11-03: 40 mg via INTRAVENOUS

## 2014-11-03 MED ORDER — ONDANSETRON 8 MG/50ML IVPB (CHCC)
8.0000 mg | Freq: Once | INTRAVENOUS | Status: AC
  Administered 2014-11-03: 8 mg via INTRAVENOUS

## 2014-11-03 MED ORDER — SODIUM CHLORIDE 0.9 % IV SOLN
100.0000 mg/m2 | Freq: Once | INTRAVENOUS | Status: AC
  Administered 2014-11-03: 225 mg via INTRAVENOUS
  Filled 2014-11-03: qty 2

## 2014-11-03 MED ORDER — SODIUM CHLORIDE 0.9 % IJ SOLN
10.0000 mL | INTRAMUSCULAR | Status: DC | PRN
  Administered 2014-11-03: 10 mL
  Filled 2014-11-03: qty 10

## 2014-11-03 MED ORDER — SODIUM CHLORIDE 0.9 % IV SOLN
Freq: Once | INTRAVENOUS | Status: AC
  Administered 2014-11-03: 09:00:00 via INTRAVENOUS

## 2014-11-03 MED ORDER — DEXAMETHASONE SODIUM PHOSPHATE 20 MG/5ML IJ SOLN
20.0000 mg | Freq: Once | INTRAMUSCULAR | Status: AC
  Administered 2014-11-03: 20 mg via INTRAVENOUS

## 2014-11-03 MED ORDER — DIPHENHYDRAMINE HCL 50 MG/ML IJ SOLN
50.0000 mg | Freq: Once | INTRAMUSCULAR | Status: AC
  Administered 2014-11-03: 50 mg via INTRAVENOUS

## 2014-11-03 MED ORDER — IBUPROFEN 200 MG PO TABS
ORAL_TABLET | ORAL | Status: AC
  Filled 2014-11-03: qty 2

## 2014-11-03 MED ORDER — IBUPROFEN 200 MG PO TABS
400.0000 mg | ORAL_TABLET | Freq: Once | ORAL | Status: AC
  Administered 2014-11-03: 400 mg via ORAL

## 2014-11-03 MED ORDER — DIPHENHYDRAMINE HCL 50 MG/ML IJ SOLN
INTRAMUSCULAR | Status: AC
  Filled 2014-11-03: qty 1

## 2014-11-03 MED ORDER — DEXAMETHASONE SODIUM PHOSPHATE 10 MG/ML IJ SOLN: 10.0000 mg | Freq: Once | INTRAMUSCULAR | Status: DC

## 2014-11-03 MED ORDER — SODIUM CHLORIDE 0.9 % IV SOLN
1000.0000 mg | Freq: Once | INTRAVENOUS | Status: AC
  Administered 2014-11-03: 1000 mg via INTRAVENOUS
  Filled 2014-11-03: qty 40

## 2014-11-03 MED ORDER — HEPARIN SOD (PORK) LOCK FLUSH 100 UNIT/ML IV SOLN
500.0000 [IU] | Freq: Once | INTRAVENOUS | Status: AC | PRN
  Administered 2014-11-03: 500 [IU]
  Filled 2014-11-03: qty 5

## 2014-11-03 MED ORDER — FAMOTIDINE IN NACL 20-0.9 MG/50ML-% IV SOLN
INTRAVENOUS | Status: AC
  Filled 2014-11-03: qty 100

## 2014-11-03 MED ORDER — DEXAMETHASONE SODIUM PHOSPHATE 20 MG/5ML IJ SOLN
INTRAMUSCULAR | Status: AC
  Filled 2014-11-03: qty 5

## 2014-11-03 NOTE — Progress Notes (Signed)
Hematology and Oncology Follow Up Visit  Megan Tate [id number] [birth year] 69 y.o. 11/03/2014   Principle Diagnosis:   Chronic lymphocytic leukemia- Trisomy 12  Current Therapy:    Status post 3 cycles of Gazyva/Endo mustine     Interim History:  [name] is back for follow-up. Megan Tate really looks great. All of Megan Tate lymph nodes have resolved. Megan Tate got Megan Tate treatment without any problems.  Megan Tate's had no nausea vomiting. There's been no headache. Megan Tate's had no fever. Megan Tate's had no change in bowel or bladder habits. Megan Tate blood sugars are doing much better right now. Megan Tate is being very diligent with following Megan Tate blood sugars.  Megan Tate's had no rashes. His been no leg swelling.  Megan Tate's had no headache. There's been no cough. Megan Tate's had no bony or muscular pain.  Overall, Megan Tate performance status is ECOG 1.  Medications:  Current outpatient prescriptions:  .  allopurinol (ZYLOPRIM) 100 MG tablet, TAKE ONE TABLET BY MOUTH ONE TIME DAILY , Disp: 30 tablet, Rfl: 4 .  bisacodyl (DULCOLAX) 5 MG EC tablet, Take 1 tablet (5 mg total) by mouth 2 (two) times daily as needed for moderate constipation., Disp: 60 tablet, Rfl: 0 .  cloNIDine (CATAPRES) 0.2 MG tablet, Take 0.2 mg by mouth 2 (two) times daily. , Disp: , Rfl:  .  CRESTOR 10 MG tablet, Take 10 mg by mouth daily. , Disp: , Rfl:  .  dorzolamide-timolol (COSOPT) 22.3-6.8 MG/ML ophthalmic solution, Place 1 drop into both eyes 2 (two) times daily. , Disp: , Rfl:  .  EDARBI 80 MG TABS, Take by mouth every morning. , Disp: , Rfl:  .  famciclovir (FAMVIR) 500 MG tablet, Take 1 tablet (500 mg total) by mouth daily., Disp: 30 tablet, Rfl: 8 .  fluconazole (DIFLUCAN) 200 MG tablet, Take 1 tablet (200 mg total) by mouth daily., Disp: 5 tablet, Rfl: 0 .  KOMBIGLYZE XR 2.01-999 MG TB24, Take by mouth every morning. , Disp: , Rfl:  .  LEVEMIR FLEXTOUCH 100 UNIT/ML Pen, Inject 10 Units into the skin every morning. , Disp: , Rfl:  .  LORazepam (ATIVAN) 0.5 MG tablet,  Take 1 tablet (0.5 mg total) by mouth every 8 (eight) hours., Disp: 30 tablet, Rfl: 2 .  metoprolol succinate (TOPROL-XL) 100 MG 24 hr tablet, Take 100 mg by mouth daily. , Disp: , Rfl:  .  ondansetron (ZOFRAN) 8 MG tablet, Take 1 tablet (8 mg total) by mouth 2 (two) times daily. Start the day after chemo for 2 days. Then take as needed for nausea or vomiting., Disp: 30 tablet, Rfl: 1 .  oxyCODONE (OXY IR/ROXICODONE) 5 MG immediate release tablet, Take 1-2 tablets (5-10 mg total) by mouth every 6 (six) hours as needed for severe pain., Disp: 90 tablet, Rfl: 0 .  prochlorperazine (COMPAZINE) 10 MG tablet, Take 1 tablet (10 mg total) by mouth every 6 (six) hours as needed (Nausea or vomiting)., Disp: 30 tablet, Rfl: 1 .  spironolactone (ALDACTONE) 25 MG tablet, Take 1 tablet (25 mg total) by mouth once., Disp: 90 tablet, Rfl: 3 .  Vitamin D, Ergocalciferol, (DRISDOL) 50000 UNITS CAPS capsule, Take 50,000 Units by mouth every 7 (seven) days. Every thursday, Disp: , Rfl:  .  COLCRYS 0.6 MG tablet, Take 0.6 mg by mouth as needed. , Disp: , Rfl:  .  lidocaine-prilocaine (EMLA) cream, Apply to Port-A-Cath site 1 hour ride to treatment (Patient not taking: Reported on 11/03/2014), Disp: 30 g, Rfl: 4 No current facility-administered medications  for this visit.  Facility-Administered Medications Ordered in Other Visits:  .  dexamethasone (DECADRON) injection 10 mg, 10 mg, Intravenous, Once, Volanda Napoleon, MD .  Dexamethasone Sodium Phosphate (DECADRON) injection 20 mg, 20 mg, Intravenous, Once, Volanda Napoleon, MD .  diphenhydrAMINE (BENADRYL) injection 50 mg, 50 mg, Intravenous, Once, Volanda Napoleon, MD .  famotidine (PEPCID) IVPB 40 mg, 40 mg, Intravenous, Once, Volanda Napoleon, MD, 40 mg at 11/03/14 8250 .  heparin lock flush 100 unit/mL, 500 Units, Intracatheter, Once PRN, Volanda Napoleon, MD .  obinutuzumab (GAZYVA) 1,000 mg in sodium chloride 0.9 % 250 mL (3.4483 mg/mL) chemo infusion, 1,000 mg,  Intravenous, Once, Volanda Napoleon, MD .  ondansetron (ZOFRAN) IVPB 8 mg, 8 mg, Intravenous, Once, Volanda Napoleon, MD .  sodium chloride 0.9 % injection 10 mL, 10 mL, Intracatheter, PRN, Volanda Napoleon, MD  Allergies:  Allergies  Allergen Reactions  . Tylenol [Acetaminophen] Other (See Comments)    Messes with stomach    Past Medical History, Surgical history, Social history, and Family History were reviewed and updated.  Review of Systems: As above  Physical Exam:  height is 5\' 4"  (1.626 m) and weight is 229 lb (103.874 kg). Megan Tate oral temperature is 98.3 F (36.8 C). Megan Tate blood pressure is 216/83 and Megan Tate pulse is 73. Megan Tate respiration is 16.   Well-developed and well-nourished African-American female. Megan Tate is mildly obese. Head and neck exam shows no cervical or subclavicular lymph nodes. There is no mucositis. Megan Tate has no scleral icterus. Lungs are clear. Cardiac exam regular rate and rhythm with no murmurs, rubs or bruits. Axillary exam shows no axillary adenopathy. Abdomen is soft. Megan Tate is moderately obese. Megan Tate has good bowel sounds. There is no palpable liver or spleen tip. Back exam shows no tenderness over the spine, ribs or hips. Extremities shows no clubbing, cyanosis or edema. Megan Tate has good range of motion of Megan Tate joints. Megan Tate has good strength in Megan Tate arms and legs. Skin exam no rashes, ecchymoses or petechia. Neurological exam is nonfocal.  Lab Results  Component Value Date   WBC 4.5 11/03/2014   HGB 11.6 11/03/2014   HCT 35.3 11/03/2014   MCV 72* 11/03/2014   PLT 86* 11/03/2014     Chemistry      Component Value Date/Time   NA 135 11/03/2014 0817   NA 138 06/16/2014 1102   K 3.8 11/03/2014 0817   K 4.6 06/16/2014 1102   CL 103 11/03/2014 0817   CL 104 06/16/2014 1102   CO2 28 11/03/2014 0817   CO2 28 06/16/2014 1102   BUN 8 11/03/2014 0817   BUN 17 06/16/2014 1102   CREATININE 0.7 11/03/2014 0817   CREATININE 1.22* 06/16/2014 1102      Component Value Date/Time    CALCIUM 10.5* 11/03/2014 0817   CALCIUM 10.3 06/16/2014 1102   ALKPHOS 61 11/03/2014 0817   ALKPHOS 78 06/16/2014 1102   AST 16 11/03/2014 0817   AST 15 06/16/2014 1102   ALT 8* 11/03/2014 0817   ALT 14 06/16/2014 1102   BILITOT 0.70 11/03/2014 0817   BILITOT 0.4 06/16/2014 1102         Impression and Plan: Megan Tate is 69 year old African-American female with chronic lymphocytic leukemia. We had to treat Megan Tate as Megan Tate white cell count was going up and Megan Tate lymphadenopathy was worsening.  Megan Tate's had a fantastic response. I will think that given that Megan Tate has the trisomy 12 chromosomal abnormalities, but this should  be a better marker for Megan Tate.  Even though Megan Tate is somewhat neutropenic, Megan Tate is asymptomatic. Megan Tate monocytes are up. We will proceed with therapy.  I will repeat a CT scan on Megan Tate this month. I think that we will find that Megan Tate will have resolution of Megan Tate adenopathy and splenomegaly.  I spent about 25-30 minutes with Megan Tate and Megan Tate husband. I went over Megan Tate labs and one over the plan that we have for Megan Tate.  We'll get Megan Tate back in one month.  I think Megan Tate probably will need 6 cycles of therapy.   Volanda Napoleon, MD 2/3/20169:34 AM

## 2014-11-03 NOTE — Patient Instructions (Signed)
[facility] Discharge Instructions for Patients Receiving Chemotherapy  Today you received the following chemotherapy agents Gayzva and Treanda. To help prevent nausea and vomiting after your treatment, we encourage you to take your nausea medication as prescribed.    If you develop nausea and vomiting that is not controlled by your nausea medication, call the clinic.   BELOW ARE SYMPTOMS THAT SHOULD BE REPORTED IMMEDIATELY:  *FEVER GREATER THAN 100.5 F  *CHILLS WITH OR WITHOUT FEVER  NAUSEA AND VOMITING THAT IS NOT CONTROLLED WITH YOUR NAUSEA MEDICATION  *UNUSUAL SHORTNESS OF BREATH  *UNUSUAL BRUISING OR BLEEDING  TENDERNESS IN MOUTH AND THROAT WITH OR WITHOUT PRESENCE OF ULCERS  *URINARY PROBLEMS  *BOWEL PROBLEMS  UNUSUAL RASH Items with * indicate a potential emergency and should be followed up as soon as possible.  Feel free to call the clinic you have any questions or concerns. The clinic phone number is [phone].

## 2014-11-04 ENCOUNTER — Telehealth: Payer: Self-pay | Admitting: Hematology & Oncology

## 2014-11-04 ENCOUNTER — Ambulatory Visit (HOSPITAL_BASED_OUTPATIENT_CLINIC_OR_DEPARTMENT_OTHER): Payer: Medicare Other

## 2014-11-04 DIAGNOSIS — C9110 Chronic lymphocytic leukemia of B-cell type not having achieved remission: Secondary | ICD-10-CM

## 2014-11-04 DIAGNOSIS — Z5111 Encounter for antineoplastic chemotherapy: Secondary | ICD-10-CM

## 2014-11-04 MED ORDER — SODIUM CHLORIDE 0.9 % IV SOLN
Freq: Once | INTRAVENOUS | Status: AC
  Administered 2014-11-04: 12:00:00 via INTRAVENOUS

## 2014-11-04 MED ORDER — SODIUM CHLORIDE 0.9 % IV SOLN
150.0000 mg | Freq: Once | INTRAVENOUS | Status: AC
  Administered 2014-11-04: 150 mg via INTRAVENOUS
  Filled 2014-11-04: qty 5

## 2014-11-04 MED ORDER — SODIUM CHLORIDE 0.9 % IJ SOLN
10.0000 mL | INTRAMUSCULAR | Status: DC | PRN
  Administered 2014-11-04: 10 mL
  Filled 2014-11-04: qty 10

## 2014-11-04 MED ORDER — DEXAMETHASONE SODIUM PHOSPHATE 10 MG/ML IJ SOLN
INTRAMUSCULAR | Status: AC
  Filled 2014-11-04: qty 1

## 2014-11-04 MED ORDER — DEXAMETHASONE SODIUM PHOSPHATE 10 MG/ML IJ SOLN
10.0000 mg | Freq: Once | INTRAMUSCULAR | Status: AC
  Administered 2014-11-04: 10 mg via INTRAVENOUS

## 2014-11-04 MED ORDER — ONDANSETRON 8 MG/50ML IVPB (CHCC)
8.0000 mg | Freq: Once | INTRAVENOUS | Status: AC
  Administered 2014-11-04: 8 mg via INTRAVENOUS

## 2014-11-04 MED ORDER — HEPARIN SOD (PORK) LOCK FLUSH 100 UNIT/ML IV SOLN
500.0000 [IU] | Freq: Once | INTRAVENOUS | Status: AC | PRN
  Administered 2014-11-04: 500 [IU]
  Filled 2014-11-04: qty 5

## 2014-11-04 MED ORDER — BENDAMUSTINE HCL CHEMO INJECTION 180 MG/2ML
100.0000 mg/m2 | Freq: Once | INTRAVENOUS | Status: AC
  Administered 2014-11-04: 225 mg via INTRAVENOUS
  Filled 2014-11-04: qty 2.5

## 2014-11-04 NOTE — Patient Instructions (Signed)
Bendamustine Injection What is this medicine? BENDAMUSTINE (BEN da MUS teen) is a chemotherapy drug. It is used to treat chronic lymphocytic leukemia and non-Hodgkin lymphoma. This medicine may be used for other purposes; ask your health care provider or pharmacist if you have questions. COMMON BRAND NAME(S): Treanda What should I tell my health care provider before I take this medicine? They need to know if you have any of these conditions: -kidney disease -liver disease -an unusual or allergic reaction to bendamustine, mannitol, other medicines, foods, dyes, or preservatives -pregnant or trying to get pregnant -breast-feeding How should I use this medicine? This medicine is for infusion into a vein. It is given by a health care professional in a hospital or clinic setting. Talk to your pediatrician regarding the use of this medicine in children. Special care may be needed. Overdosage: If you think you have taken too much of this medicine contact a poison control center or emergency room at once. NOTE: This medicine is only for you. Do not share this medicine with others. What if I miss a dose? It is important not to miss your dose. Call your doctor or health care professional if you are unable to keep an appointment. What may interact with this medicine? Do not take this medicine with any of the following medications: -clozapine This medicine may also interact with the following medications: -atazanavir -cimetidine -ciprofloxacin -enoxacin -fluvoxamine -medicines for seizures like carbamazepine and phenobarbital -mexiletine -rifampin -tacrine -thiabendazole -zileuton This list may not describe all possible interactions. Give your health care provider a list of all the medicines, herbs, non-prescription drugs, or dietary supplements you use. Also tell them if you smoke, drink alcohol, or use illegal drugs. Some items may interact with your medicine. What should I watch for while  using this medicine? Your condition will be monitored carefully while you are receiving this medicine. This drug may make you feel generally unwell. This is not uncommon, as chemotherapy can affect healthy cells as well as cancer cells. Report any side effects. Continue your course of treatment even though you feel ill unless your doctor tells you to stop. Call your doctor or health care professional for advice if you get a fever, chills or sore throat, or other symptoms of a cold or flu. Do not treat yourself. This drug decreases your body's ability to fight infections. Try to avoid being around people who are sick. This medicine may increase your risk to bruise or bleed. Call your doctor or health care professional if you notice any unusual bleeding. Be careful brushing and flossing your teeth or using a toothpick because you may get an infection or bleed more easily. If you have any dental work done, tell your dentist you are receiving this medicine. Avoid taking products that contain aspirin, acetaminophen, ibuprofen, naproxen, or ketoprofen unless instructed by your doctor. These medicines may hide a fever. Do not become pregnant while taking this medicine. Women should inform their doctor if they wish to become pregnant or think they might be pregnant. There is a potential for serious side effects to an unborn child. Men should inform their doctors if they wish to father a child. This medicine may lower sperm counts. Talk to your health care professional or pharmacist for more information. Do not breast-feed an infant while taking this medicine. What side effects may I notice from receiving this medicine? Side effects that you should report to your doctor or health care professional as soon as possible: -allergic reactions like skin rash,   itching or hives, swelling of the face, lips, or tongue -low blood counts - this medicine may decrease the number of white blood cells, red blood cells and  platelets. You may be at increased risk for infections and bleeding. -signs of infection - fever or chills, cough, sore throat, pain or difficulty passing urine -signs of decreased platelets or bleeding - bruising, pinpoint red spots on the skin, black, tarry stools, blood in the urine -signs of decreased red blood cells - unusually weak or tired, fainting spells, lightheadedness -trouble passing urine or change in the amount of urine Side effects that usually do not require medical attention (report to your doctor or health care professional if they continue or are bothersome): -diarrhea This list may not describe all possible side effects. Call your doctor for medical advice about side effects. You may report side effects to FDA at 1-800-FDA-1088. Where should I keep my medicine? This drug is given in a hospital or clinic and will not be stored at home. NOTE: This sheet is a summary. It may not cover all possible information. If you have questions about this medicine, talk to your doctor, pharmacist, or health care provider.  2015, [employer]. (2011-12-14 14:15:47)  

## 2014-11-04 NOTE — Telephone Encounter (Signed)
[employer] has APPROVED the OXYCODONE CAP 5MG  180 TAB EVERY 30 DAYS.     P: [phone]

## 2014-11-08 ENCOUNTER — Other Ambulatory Visit (HOSPITAL_BASED_OUTPATIENT_CLINIC_OR_DEPARTMENT_OTHER): Payer: Medicare Other | Admitting: Lab

## 2014-11-08 ENCOUNTER — Ambulatory Visit (HOSPITAL_BASED_OUTPATIENT_CLINIC_OR_DEPARTMENT_OTHER): Payer: Medicare Other

## 2014-11-08 ENCOUNTER — Other Ambulatory Visit: Payer: Self-pay

## 2014-11-08 VITALS — BP 214/96 | HR 63 | Temp 98.9°F | Resp 20

## 2014-11-08 DIAGNOSIS — C9110 Chronic lymphocytic leukemia of B-cell type not having achieved remission: Secondary | ICD-10-CM

## 2014-11-08 DIAGNOSIS — E119 Type 2 diabetes mellitus without complications: Secondary | ICD-10-CM

## 2014-11-08 DIAGNOSIS — Z452 Encounter for adjustment and management of vascular access device: Secondary | ICD-10-CM

## 2014-11-08 LAB — CMP (CANCER CENTER ONLY)
ALT(SGPT): 10 U/L (ref 10–47)
AST: 16 U/L (ref 11–38)
Albumin: 3.7 g/dL (ref 3.3–5.5)
Alkaline Phosphatase: 55 U/L (ref 26–84)
BUN: 12 mg/dL (ref 7–22)
CO2: 30 meq/L (ref 18–33)
Calcium: 10.5 mg/dL — ABNORMAL HIGH (ref 8.0–10.3)
Chloride: 97 mEq/L — ABNORMAL LOW (ref 98–108)
Creat: 0.7 mg/dl (ref 0.6–1.2)
GLUCOSE: 265 mg/dL — AB (ref 73–118)
POTASSIUM: 3.2 meq/L — AB (ref 3.3–4.7)
SODIUM: 136 meq/L (ref 128–145)
Total Bilirubin: 0.7 mg/dl (ref 0.20–1.60)
Total Protein: 5.9 g/dL — ABNORMAL LOW (ref 6.4–8.1)

## 2014-11-08 LAB — MANUAL DIFFERENTIAL (CHCC SATELLITE)
ALC: 0 10*3/uL — AB (ref 0.6–2.2)
ANC (CHCC HP manual diff): 0.1 10*3/uL — CL (ref 1.5–6.7)
Band Neutrophils: 2 % (ref 0–10)
Eos: 30 % — ABNORMAL HIGH (ref 0–7)
LYMPH: 14 % (ref 14–48)
MONO: 24 % — AB (ref 0–13)
Metamyelocytes: 10 % — ABNORMAL HIGH (ref 0–0)
PLT EST ~~LOC~~: DECREASED
SEG: 20 % — AB (ref 40–75)

## 2014-11-08 LAB — CBC WITH DIFFERENTIAL (CANCER CENTER ONLY)
HCT: 35.4 % (ref 34.8–46.6)
HEMOGLOBIN: 11.6 g/dL (ref 11.6–15.9)
MCH: 23.5 pg — AB (ref 26.0–34.0)
MCHC: 32.8 g/dL (ref 32.0–36.0)
MCV: 72 fL — ABNORMAL LOW (ref 81–101)
PLATELETS: 87 10*3/uL — AB (ref 145–400)
RBC: 4.94 10*6/uL (ref 3.70–5.32)
RDW: 14.1 % (ref 11.1–15.7)
WBC: 0.3 10*3/uL — CL (ref 3.9–10.0)

## 2014-11-08 MED ORDER — PROMETHAZINE HCL 25 MG/ML IJ SOLN
12.5000 mg | Freq: Four times a day (QID) | INTRAMUSCULAR | Status: DC | PRN
  Administered 2014-11-08: 12.5 mg via INTRAVENOUS

## 2014-11-08 MED ORDER — SODIUM CHLORIDE 0.9 % IV SOLN
1000.0000 mL | Freq: Once | INTRAVENOUS | Status: AC
  Administered 2014-11-08: 1000 mL via INTRAVENOUS

## 2014-11-08 MED ORDER — INSULIN REGULAR HUMAN 100 UNIT/ML IJ SOLN
10.0000 [IU] | Freq: Once | INTRAMUSCULAR | Status: AC
  Administered 2014-11-08: 10 [IU] via SUBCUTANEOUS

## 2014-11-08 MED ORDER — LEVOFLOXACIN 500 MG PO TABS: 500.0000 mg | ORAL_TABLET | Freq: Every day | ORAL | Status: DC

## 2014-11-08 MED ORDER — HEPARIN SOD (PORK) LOCK FLUSH 100 UNIT/ML IV SOLN
500.0000 [IU] | Freq: Once | INTRAVENOUS | Status: AC
  Administered 2014-11-08: 500 [IU] via INTRAVENOUS
  Filled 2014-11-08: qty 5

## 2014-11-08 MED ORDER — SODIUM CHLORIDE 0.9 % IJ SOLN
10.0000 mL | INTRAMUSCULAR | Status: DC | PRN
Start: 2014-11-08 — End: 2014-11-08
  Administered 2014-11-08: 10 mL via INTRAVENOUS
  Filled 2014-11-08: qty 10

## 2014-11-08 MED ORDER — SODIUM CHLORIDE 0.9 % IV SOLN: Freq: Once | INTRAVENOUS | Status: DC

## 2014-11-08 NOTE — Patient Instructions (Addendum)
Megan Tate, you may also take Compazine  10 mg every 6 hours as needed for nausea.    Neutropenia Neutropenia is a condition that occurs when the level of a certain type of white blood cell (neutrophil) in your body becomes lower than normal. Neutrophils are made in the bone marrow and fight infections. These cells protect against bacteria and viruses. The fewer neutrophils you have, and the longer your body remains without them, the greater your risk of getting a severe infection becomes. CAUSES  The cause of neutropenia may be hard to determine. However, it is usually due to 3 main problems:   Decreased production of neutrophils. This may be due to:  Certain medicines such as chemotherapy.  Genetic problems.  Cancer.  Radiation treatments.  Vitamin deficiency.  Some pesticides.  Increased destruction of neutrophils. This may be due to:  Overwhelming infections.  Hemolytic anemia. This is when the body destroys its own blood cells.  Chemotherapy.  Neutrophils moving to areas of the body where they cannot fight infections. This may be due to:  Dialysis procedures.  Conditions where the spleen becomes enlarged. Neutrophils are held in the spleen and are not available to the rest of the body.  Overwhelming infections. The neutrophils are held in the area of the infection and are not available to the rest of the body. SYMPTOMS  There are no specific symptoms of neutropenia. The lack of neutrophils can result in an infection, and an infection can cause various problems. DIAGNOSIS  Diagnosis is made by a blood test. A complete blood count is performed. The normal level of neutrophils in human blood differs with age and race. Infants have lower counts than older children and adults. African Americans have lower counts than Caucasians or Asians. The average adult level is 1500 cells/mm3 of blood. Neutrophil counts are interpreted as follows:  Greater than 1000 cells/mm3 gives  normal protection against infection.  500 to 1000 cells/mm3 gives an increased risk for infection.  200 to 500 cells/mm3 is a greater risk for severe infection.  Lower than 200 cells/mm3 is a marked risk of infection. This may require hospitalization and treatment with antibiotic medicines. TREATMENT  Treatment depends on the underlying cause, severity, and presence of infections or symptoms. It also depends on your health. Your caregiver will discuss the treatment plan with you. Mild cases are often easily treated and have a good outcome. Preventative measures may also be started to limit your risk of infections. Treatment can include:  Taking antibiotics.  Stopping medicines that are known to cause neutropenia.  Correcting nutritional deficiencies by eating green vegetables to supply folic acid and taking vitamin B supplements.  Stopping exposure to pesticides if your neutropenia is related to pesticide exposure.  Taking a blood growth factor called sargramostim, pegfilgrastim, or filgrastim if you are undergoing chemotherapy for cancer. This stimulates white blood cell production.  Removal of the spleen if you have Felty's syndrome and have repeated infections. HOME CARE INSTRUCTIONS   Follow your caregiver's instructions about when you need to have blood work done.  Wash your hands often. Make sure others who come in contact with you also wash their hands.  Wash raw fruits and vegetables before eating them. They can carry bacteria and fungi.  Avoid people with colds or spreadable (contagious) diseases (chickenpox, herpes zoster, influenza).  Avoid large crowds.  Avoid construction areas. The dust can release fungus into the air.  Be cautious around children in daycare or school environments.  Take  care of your respiratory system by coughing and deep breathing.  Bathe daily.  Protect your skin from cuts and burns.  Do not work in the garden or with flowers and  plants.  Care for the mouth before and after meals by brushing with a soft toothbrush. If you have mucositis, do not use mouthwash. Mouthwash contains alcohol and can dry out the mouth even more.  Clean the area between the genitals and the anus (perineal area) after urination and bowel movements. Women need to wipe from front to back.  Use a water soluble lubricant during sexual intercourse and practice good hygiene after. Do not have intercourse if you are severely neutropenic. Check with your caregiver for guidelines.  Exercise daily as tolerated.  Avoid people who were vaccinated with a live vaccine in the past 30 days. You should not receive live vaccines (polio, typhoid).  Do not provide direct care for pets. Avoid animal droppings. Do not clean litter boxes and bird cages.  Do not share food utensils.  Do not use tampons, enemas, or rectal suppositories unless directed by your caregiver.  Use an electric razor to remove hair.  Wash your hands after handling magazines, letters, and newspapers. SEEK IMMEDIATE MEDICAL CARE IF:   You have a fever.  You have chills or start to shake.  You feel nauseous or vomit.  You develop mouth sores.  You develop aches and pains.  You have redness and swelling around open wounds.  Your skin is warm to the touch.  You have pus coming from your wounds.  You develop swollen lymph nodes.  You feel weak or fatigued.  You develop red streaks on the skin. MAKE SURE YOU:  Understand these instructions.  Will watch your condition.  Will get help right away if you are not doing well or get worse. Document Released: 03/09/2002 Document Revised: 12/10/2011 Document Reviewed: 04/06/2011 [facility] Patient Information 2015 [place], [place]. This information is not intended to replace advice given to you by your health care provider. Make sure you discuss any questions you have with your health care provider.

## 2014-11-08 NOTE — Progress Notes (Signed)
Pt presents without appointment, complain of nausea since Saturday, 11/06/14.  Labs drawn and NS started. Dr. Marin Olp waiting on labs. 1:20 PM After review of labs, Rx called in for Levaquin, orders received for Phenergan 12.5 and Insulin.

## 2014-11-16 ENCOUNTER — Ambulatory Visit (HOSPITAL_BASED_OUTPATIENT_CLINIC_OR_DEPARTMENT_OTHER)
Admission: RE | Admit: 2014-11-16 | Discharge: 2014-11-16 | Disposition: A | Discharge status: Home / Self Care | Payer: Medicare Other | Source: Ambulatory Visit | Attending: Family | Admitting: Family

## 2014-11-16 ENCOUNTER — Other Ambulatory Visit: Payer: Self-pay | Admitting: Family

## 2014-11-16 ENCOUNTER — Other Ambulatory Visit (HOSPITAL_BASED_OUTPATIENT_CLINIC_OR_DEPARTMENT_OTHER): Payer: Medicare Other | Admitting: Lab

## 2014-11-16 ENCOUNTER — Ambulatory Visit (HOSPITAL_BASED_OUTPATIENT_CLINIC_OR_DEPARTMENT_OTHER): Payer: Medicare Other

## 2014-11-16 VITALS — BP 219/86 | HR 76 | Temp 98.7°F

## 2014-11-16 DIAGNOSIS — C9110 Chronic lymphocytic leukemia of B-cell type not having achieved remission: Secondary | ICD-10-CM

## 2014-11-16 DIAGNOSIS — I517 Cardiomegaly: Secondary | ICD-10-CM | POA: Diagnosis not present

## 2014-11-16 DIAGNOSIS — R509 Fever, unspecified: Secondary | ICD-10-CM | POA: Diagnosis not present

## 2014-11-16 DIAGNOSIS — R110 Nausea: Secondary | ICD-10-CM

## 2014-11-16 LAB — COMPREHENSIVE METABOLIC PANEL
ALBUMIN: 3.4 g/dL — AB (ref 3.5–5.2)
ALT: <8 U/L (ref 0–35)
AST: 11 U/L (ref 0–37)
Alkaline Phosphatase: 52 U/L (ref 39–117)
BUN: 7 mg/dL (ref 6–23)
CALCIUM: 9.7 mg/dL (ref 8.4–10.5)
CHLORIDE: 101 meq/L (ref 96–112)
CO2: 28 meq/L (ref 19–32)
CREATININE: 0.75 mg/dL (ref 0.50–1.10)
GLUCOSE: 195 mg/dL — AB (ref 70–99)
POTASSIUM: 3.2 meq/L — AB (ref 3.5–5.3)
Sodium: 136 mEq/L (ref 135–145)
Total Bilirubin: 0.4 mg/dL (ref 0.2–1.2)
Total Protein: 5.4 g/dL — ABNORMAL LOW (ref 6.0–8.3)

## 2014-11-16 LAB — CBC WITH DIFFERENTIAL (CANCER CENTER ONLY)
BASO#: 0 10*3/uL (ref 0.0–0.2)
BASO%: 0.7 % (ref 0.0–2.0)
EOS%: 0.7 % (ref 0.0–7.0)
Eosinophils Absolute: 0 10*3/uL (ref 0.0–0.5)
HCT: 32.8 % — ABNORMAL LOW (ref 34.8–46.6)
HGB: 10.9 g/dL — ABNORMAL LOW (ref 11.6–15.9)
LYMPH#: 0.4 10*3/uL — AB (ref 0.9–3.3)
LYMPH%: 25.2 % (ref 14.0–48.0)
MCH: 23.7 pg — ABNORMAL LOW (ref 26.0–34.0)
MCHC: 33.2 g/dL (ref 32.0–36.0)
MCV: 71 fL — ABNORMAL LOW (ref 81–101)
MONO#: 1 10*3/uL — AB (ref 0.1–0.9)
MONO%: 69.8 % — AB (ref 0.0–13.0)
NEUT%: 3.6 % — ABNORMAL LOW (ref 39.6–80.0)
NEUTROS ABS: 0.1 10*3/uL — AB (ref 1.5–6.5)
PLATELETS: 252 10*3/uL (ref 145–400)
RBC: 4.6 10*6/uL (ref 3.70–5.32)
RDW: 14.5 % (ref 11.1–15.7)
WBC: 1.4 10*3/uL — AB (ref 3.9–10.0)

## 2014-11-16 LAB — LACTATE DEHYDROGENASE: LDH: 107 U/L (ref 94–250)

## 2014-11-16 LAB — TECHNOLOGIST REVIEW CHCC SATELLITE

## 2014-11-16 MED ORDER — SODIUM CHLORIDE 0.9 % IV SOLN
Freq: Once | INTRAVENOUS | Status: AC
  Administered 2014-11-16: 14:00:00 via INTRAVENOUS

## 2014-11-16 MED ORDER — AMOXICILLIN-POT CLAVULANATE 875-125 MG PO TABS: 1.0000 | ORAL_TABLET | Freq: Two times a day (BID) | ORAL | Status: DC

## 2014-11-16 NOTE — Patient Instructions (Signed)
Dehydration, Adult Dehydration is when you lose more fluids from the body than you take in. Vital organs like the kidneys, brain, and heart cannot function without a proper amount of fluids and salt. Any loss of fluids from the body can cause dehydration.  CAUSES   Vomiting.  Diarrhea.  Excessive sweating.  Excessive urine output.  Fever. SYMPTOMS  Mild dehydration  Thirst.  Dry lips.  Slightly dry mouth. Moderate dehydration  Very dry mouth.  Sunken eyes.  Skin does not bounce back quickly when lightly pinched and released.  Dark urine and decreased urine production.  Decreased tear production.  Headache. Severe dehydration  Very dry mouth.  Extreme thirst.  Rapid, weak pulse (more than 100 beats per minute at rest).  Cold hands and feet.  Not able to sweat in spite of heat and temperature.  Rapid breathing.  Blue lips.  Confusion and lethargy.  Difficulty being awakened.  Minimal urine production.  No tears. DIAGNOSIS  Your caregiver will diagnose dehydration based on your symptoms and your exam. Blood and urine tests will help confirm the diagnosis. The diagnostic evaluation should also identify the cause of dehydration. TREATMENT  Treatment of mild or moderate dehydration can often be done at home by increasing the amount of fluids that you drink. It is best to drink small amounts of fluid more often. Drinking too much at one time can make vomiting worse. Refer to the home care instructions below. Severe dehydration needs to be treated at the hospital where you will probably be given intravenous (IV) fluids that contain water and electrolytes. HOME CARE INSTRUCTIONS   Ask your caregiver about specific rehydration instructions.  Drink enough fluids to keep your urine clear or pale yellow.  Drink small amounts frequently if you have nausea and vomiting.  Eat as you normally do.  Avoid:  Foods or drinks high in sugar.  Carbonated  drinks.  Juice.  Extremely hot or cold fluids.  Drinks with caffeine.  Fatty, greasy foods.  Alcohol.  Tobacco.  Overeating.  Gelatin desserts.  Wash your hands well to avoid spreading bacteria and viruses.  Only take over-the-counter or prescription medicines for pain, discomfort, or fever as directed by your caregiver.  Ask your caregiver if you should continue all prescribed and over-the-counter medicines.  Keep all follow-up appointments with your caregiver. SEEK MEDICAL CARE IF:  You have abdominal pain and it increases or stays in one area (localizes).  You have a rash, stiff neck, or severe headache.  You are irritable, sleepy, or difficult to awaken.  You are weak, dizzy, or extremely thirsty. SEEK IMMEDIATE MEDICAL CARE IF:   You are unable to keep fluids down or you get worse despite treatment.  You have frequent episodes of vomiting or diarrhea.  You have blood or green matter (bile) in your vomit.  You have blood in your stool or your stool looks black and tarry.  You have not urinated in 6 to 8 hours, or you have only urinated a small amount of very dark urine.  You have a fever.  You faint. MAKE SURE YOU:   Understand these instructions.  Will watch your condition.  Will get help right away if you are not doing well or get worse. Document Released: 09/17/2005 Document Revised: 12/10/2011 Document Reviewed: 05/07/2011 [employer] Patient Information 2015 [employer]. This information is not intended to replace advice given to you by your health care provider. Make sure you discuss any questions you have with your health care   provider.  

## 2014-11-19 ENCOUNTER — Other Ambulatory Visit: Payer: Self-pay | Admitting: Nurse Practitioner

## 2014-11-19 DIAGNOSIS — C9110 Chronic lymphocytic leukemia of B-cell type not having achieved remission: Secondary | ICD-10-CM

## 2014-11-19 MED ORDER — ONETOUCH ULTRA SYSTEM W/DEVICE KIT: 1.0000 | PACK | Freq: Once | Status: DC

## 2014-11-19 MED ORDER — GLUCOSE BLOOD VI STRP: ORAL_STRIP | Status: DC

## 2014-11-24 ENCOUNTER — Ambulatory Visit (HOSPITAL_BASED_OUTPATIENT_CLINIC_OR_DEPARTMENT_OTHER)
Admission: RE | Admit: 2014-11-24 | Discharge: 2014-11-24 | Disposition: A | Discharge status: Home / Self Care | Payer: Medicare Other | Source: Ambulatory Visit | Attending: Hematology & Oncology | Admitting: Hematology & Oncology

## 2014-11-24 ENCOUNTER — Ambulatory Visit: Payer: Medicare Other | Admitting: Hematology & Oncology

## 2014-11-24 ENCOUNTER — Ambulatory Visit: Payer: Medicare Other

## 2014-11-24 ENCOUNTER — Ambulatory Visit (HOSPITAL_BASED_OUTPATIENT_CLINIC_OR_DEPARTMENT_OTHER): Payer: Medicare Other

## 2014-11-24 ENCOUNTER — Other Ambulatory Visit: Payer: Medicare Other | Admitting: Lab

## 2014-11-24 DIAGNOSIS — C9110 Chronic lymphocytic leukemia of B-cell type not having achieved remission: Secondary | ICD-10-CM | POA: Insufficient documentation

## 2014-11-24 DIAGNOSIS — Z9221 Personal history of antineoplastic chemotherapy: Secondary | ICD-10-CM | POA: Diagnosis not present

## 2014-11-24 DIAGNOSIS — Z452 Encounter for adjustment and management of vascular access device: Secondary | ICD-10-CM

## 2014-11-24 MED ORDER — HEPARIN SOD (PORK) LOCK FLUSH 100 UNIT/ML IV SOLN
500.0000 [IU] | Freq: Once | INTRAVENOUS | Status: AC
  Administered 2014-11-24: 500 [IU] via INTRAVENOUS
  Filled 2014-11-24: qty 5

## 2014-11-24 MED ORDER — SODIUM CHLORIDE 0.9 % IJ SOLN
10.0000 mL | INTRAMUSCULAR | Status: DC | PRN
  Administered 2014-11-24: 10 mL via INTRAVENOUS
  Filled 2014-11-24: qty 10

## 2014-11-24 MED ORDER — IOHEXOL 300 MG/ML  SOLN
100.0000 mL | Freq: Once | INTRAMUSCULAR | Status: AC | PRN
  Administered 2014-11-24: 100 mL via INTRAVENOUS

## 2014-11-24 NOTE — Patient Instructions (Signed)

## 2014-11-25 ENCOUNTER — Telehealth: Payer: Self-pay | Admitting: Nurse Practitioner

## 2014-11-25 NOTE — Telephone Encounter (Addendum)
Pt was very happy. She verbalized understanding and appreciation.   ----- Message from Volanda Napoleon, MD sent at 11/24/2014  6:47 PM EST ----- Please call and tell her that the lymph nodes are all gone now. Everything has come back to normal. The treatments are working like I do they would. Thanks

## 2014-12-01 ENCOUNTER — Ambulatory Visit (HOSPITAL_BASED_OUTPATIENT_CLINIC_OR_DEPARTMENT_OTHER): Payer: Medicare Other

## 2014-12-01 ENCOUNTER — Ambulatory Visit (HOSPITAL_BASED_OUTPATIENT_CLINIC_OR_DEPARTMENT_OTHER): Payer: Medicare Other | Admitting: Hematology & Oncology

## 2014-12-01 ENCOUNTER — Other Ambulatory Visit (HOSPITAL_BASED_OUTPATIENT_CLINIC_OR_DEPARTMENT_OTHER): Payer: Medicare Other | Admitting: Lab

## 2014-12-01 ENCOUNTER — Encounter: Payer: Self-pay | Admitting: Hematology & Oncology

## 2014-12-01 VITALS — BP 220/102 | HR 79 | Temp 98.2°F | Resp 16 | Ht 64.0 in | Wt 211.0 lb

## 2014-12-01 DIAGNOSIS — Z5111 Encounter for antineoplastic chemotherapy: Secondary | ICD-10-CM

## 2014-12-01 DIAGNOSIS — C9110 Chronic lymphocytic leukemia of B-cell type not having achieved remission: Secondary | ICD-10-CM

## 2014-12-01 DIAGNOSIS — Z5112 Encounter for antineoplastic immunotherapy: Secondary | ICD-10-CM

## 2014-12-01 LAB — CMP (CANCER CENTER ONLY)
ALT(SGPT): 18 U/L (ref 10–47)
AST: 24 U/L (ref 11–38)
Albumin: 3.8 g/dL (ref 3.3–5.5)
Alkaline Phosphatase: 58 U/L (ref 26–84)
BUN, Bld: 8 mg/dL (ref 7–22)
CALCIUM: 11.2 mg/dL — AB (ref 8.0–10.3)
CHLORIDE: 98 meq/L (ref 98–108)
CO2: 28 meq/L (ref 18–33)
Creat: 1 mg/dl (ref 0.6–1.2)
Glucose, Bld: 196 mg/dL — ABNORMAL HIGH (ref 73–118)
POTASSIUM: 3.8 meq/L (ref 3.3–4.7)
SODIUM: 142 meq/L (ref 128–145)
TOTAL PROTEIN: 6.3 g/dL — AB (ref 6.4–8.1)
Total Bilirubin: 0.7 mg/dl (ref 0.20–1.60)

## 2014-12-01 LAB — LACTATE DEHYDROGENASE: LDH: 141 U/L (ref 94–250)

## 2014-12-01 LAB — CBC WITH DIFFERENTIAL (CANCER CENTER ONLY)
BASO#: 0 10*3/uL (ref 0.0–0.2)
BASO%: 0.4 % (ref 0.0–2.0)
EOS ABS: 0.1 10*3/uL (ref 0.0–0.5)
EOS%: 4.6 % (ref 0.0–7.0)
HEMATOCRIT: 37.1 % (ref 34.8–46.6)
HGB: 12.3 g/dL (ref 11.6–15.9)
LYMPH#: 0.7 10*3/uL — ABNORMAL LOW (ref 0.9–3.3)
LYMPH%: 28 % (ref 14.0–48.0)
MCH: 23.9 pg — ABNORMAL LOW (ref 26.0–34.0)
MCHC: 33.2 g/dL (ref 32.0–36.0)
MCV: 72 fL — ABNORMAL LOW (ref 81–101)
MONO#: 0.8 10*3/uL (ref 0.1–0.9)
MONO%: 35.1 % — AB (ref 0.0–13.0)
NEUT#: 0.8 10*3/uL — ABNORMAL LOW (ref 1.5–6.5)
NEUT%: 31.9 % — ABNORMAL LOW (ref 39.6–80.0)
RBC: 5.14 10*6/uL (ref 3.70–5.32)
RDW: 14.8 % (ref 11.1–15.7)
WBC: 2.4 10*3/uL — AB (ref 3.9–10.0)

## 2014-12-01 LAB — CHCC SATELLITE - SMEAR

## 2014-12-01 MED ORDER — ONDANSETRON 8 MG/50ML IVPB (CHCC)
8.0000 mg | Freq: Once | INTRAVENOUS | Status: AC
  Administered 2014-12-01: 8 mg via INTRAVENOUS

## 2014-12-01 MED ORDER — LIDOCAINE-PRILOCAINE 2.5-2.5 % EX CREA
TOPICAL_CREAM | CUTANEOUS | Status: AC
  Filled 2014-12-01: qty 30

## 2014-12-01 MED ORDER — IBUPROFEN 200 MG PO TABS
400.0000 mg | ORAL_TABLET | Freq: Once | ORAL | Status: AC
  Administered 2014-12-01: 400 mg via ORAL

## 2014-12-01 MED ORDER — OBINUTUZUMAB CHEMO INJECTION 1000 MG/40ML
1000.0000 mg | Freq: Once | INTRAVENOUS | Status: AC
  Administered 2014-12-01: 1000 mg via INTRAVENOUS
  Filled 2014-12-01: qty 40

## 2014-12-01 MED ORDER — IBUPROFEN 200 MG PO TABS
ORAL_TABLET | ORAL | Status: AC
  Filled 2014-12-01: qty 2

## 2014-12-01 MED ORDER — FOSAPREPITANT DIMEGLUMINE INJECTION 150 MG
150.0000 mg | Freq: Once | INTRAVENOUS | Status: AC
  Administered 2014-12-01: 150 mg via INTRAVENOUS
  Filled 2014-12-01: qty 5

## 2014-12-01 MED ORDER — DEXAMETHASONE SODIUM PHOSPHATE 20 MG/5ML IJ SOLN
12.0000 mg | Freq: Once | INTRAMUSCULAR | Status: AC
  Administered 2014-12-01: 12 mg via INTRAVENOUS

## 2014-12-01 MED ORDER — SODIUM CHLORIDE 0.9 % IV SOLN
Freq: Once | INTRAVENOUS | Status: AC
  Administered 2014-12-01: 11:00:00 via INTRAVENOUS

## 2014-12-01 MED ORDER — HEPARIN SOD (PORK) LOCK FLUSH 100 UNIT/ML IV SOLN
500.0000 [IU] | Freq: Once | INTRAVENOUS | Status: AC | PRN
  Administered 2014-12-01: 500 [IU]
  Filled 2014-12-01: qty 5

## 2014-12-01 MED ORDER — DIPHENHYDRAMINE HCL 50 MG/ML IJ SOLN
INTRAMUSCULAR | Status: AC
  Filled 2014-12-01: qty 1

## 2014-12-01 MED ORDER — FAMOTIDINE IN NACL 20-0.9 MG/50ML-% IV SOLN
40.0000 mg | Freq: Once | INTRAVENOUS | Status: AC
Start: 2014-12-01 — End: 2014-12-01
  Administered 2014-12-01: 40 mg via INTRAVENOUS

## 2014-12-01 MED ORDER — DEXAMETHASONE SODIUM PHOSPHATE 20 MG/5ML IJ SOLN
INTRAMUSCULAR | Status: AC
  Filled 2014-12-01: qty 5

## 2014-12-01 MED ORDER — SODIUM CHLORIDE 0.9 % IJ SOLN
10.0000 mL | INTRAMUSCULAR | Status: DC | PRN
  Administered 2014-12-01: 10 mL
  Filled 2014-12-01: qty 10

## 2014-12-01 MED ORDER — FAMOTIDINE IN NACL 20-0.9 MG/50ML-% IV SOLN
INTRAVENOUS | Status: AC
  Filled 2014-12-01: qty 100

## 2014-12-01 MED ORDER — SODIUM CHLORIDE 0.9 % IV SOLN
90.0000 mg/m2 | Freq: Once | INTRAVENOUS | Status: AC
  Administered 2014-12-01: 198 mg via INTRAVENOUS
  Filled 2014-12-01: qty 2

## 2014-12-01 MED ORDER — DIPHENHYDRAMINE HCL 50 MG/ML IJ SOLN
50.0000 mg | Freq: Once | INTRAMUSCULAR | Status: AC
  Administered 2014-12-01: 50 mg via INTRAVENOUS

## 2014-12-01 NOTE — Patient Instructions (Signed)
[facility] Discharge Instructions for Patients Receiving Chemotherapy  Today you received the following chemotherapy agents [place]  To help prevent nausea and vomiting after your treatment, we encourage you to take your nausea medication    If you develop nausea and vomiting that is not controlled by your nausea medication, call the clinic.   BELOW ARE SYMPTOMS THAT SHOULD BE REPORTED IMMEDIATELY:  *FEVER GREATER THAN 100.5 F  *CHILLS WITH OR WITHOUT FEVER  NAUSEA AND VOMITING THAT IS NOT CONTROLLED WITH YOUR NAUSEA MEDICATION  *UNUSUAL SHORTNESS OF BREATH  *UNUSUAL BRUISING OR BLEEDING  TENDERNESS IN MOUTH AND THROAT WITH OR WITHOUT PRESENCE OF ULCERS  *URINARY PROBLEMS  *BOWEL PROBLEMS  UNUSUAL RASH Items with * indicate a potential emergency and should be followed up as soon as possible.  Feel free to call the clinic you have any questions or concerns. The clinic phone number is [phone].

## 2014-12-01 NOTE — Progress Notes (Signed)
Hematology and Oncology Follow Up Visit  Megan Tate [id number] [birth year] 69 y.o. 12/01/2014   Principle Diagnosis:   Chronic lymphocytic leukemia- Trisomy 12  Current Therapy:    Status post 3 cycles of Gazyva/Endo mustine     Interim History:  [name] is back for follow-up. She really looks great. All of her lymph nodes have resolved. She had a CT scan done a couple weeks ago. The CT scan showed complete resolution of her lymphadenopathy. There is also resolution of splenomegaly.  After her last cycle, she had a lot of nausea and vomiting. I'm not sure exactly which triggered all this. She got IV fluids. Her blood sugars were quite high.  She has had no fever. She's had no diarrhea. She's had no rashes.  She's had no cough or shortness of breath.  The been no mouth sores.   Overall, her performance status is ECOG 1.  Medications:  Current outpatient prescriptions:  .  allopurinol (ZYLOPRIM) 100 MG tablet, TAKE ONE TABLET BY MOUTH ONE TIME DAILY , Disp: 30 tablet, Rfl: 4 .  bisacodyl (DULCOLAX) 5 MG EC tablet, Take 1 tablet (5 mg total) by mouth 2 (two) times daily as needed for moderate constipation., Disp: 60 tablet, Rfl: 0 .  Blood Glucose Monitoring Suppl (ONE TOUCH ULTRA SYSTEM KIT) W/DEVICE KIT, 1 kit by Does not apply route once. Check blood sugar daily before meals and at bedtime., Disp: 1 each, Rfl: 0 .  cloNIDine (CATAPRES) 0.2 MG tablet, Take 0.2 mg by mouth 2 (two) times daily. , Disp: , Rfl:  .  COLCRYS 0.6 MG tablet, Take 0.6 mg by mouth as needed. , Disp: , Rfl:  .  CRESTOR 10 MG tablet, Take 10 mg by mouth daily. , Disp: , Rfl:  .  dorzolamide-timolol (COSOPT) 22.3-6.8 MG/ML ophthalmic solution, Place 1 drop into both eyes 2 (two) times daily. , Disp: , Rfl:  .  EDARBI 80 MG TABS, Take by mouth every morning. , Disp: , Rfl:  .  famciclovir (FAMVIR) 500 MG tablet, Take 1 tablet (500 mg total) by mouth daily., Disp: 30 tablet, Rfl: 8 .  fluconazole (DIFLUCAN)  200 MG tablet, Take 1 tablet (200 mg total) by mouth daily., Disp: 5 tablet, Rfl: 0 .  glucose blood test strip, Monitor blood glucose daily before every meal and at bedtime, Disp: 100 each, Rfl: 12 .  KOMBIGLYZE XR 2.01-999 MG TB24, Take by mouth every morning. , Disp: , Rfl:  .  LEVEMIR FLEXTOUCH 100 UNIT/ML Pen, Inject 10 Units into the skin every morning. , Disp: , Rfl:  .  levofloxacin (LEVAQUIN) 500 MG tablet, Take 1 tablet (500 mg total) by mouth daily., Disp: 10 tablet, Rfl: 0 .  lidocaine-prilocaine (EMLA) cream, Apply to Port-A-Cath site 1 hour ride to treatment, Disp: 30 g, Rfl: 4 .  LORazepam (ATIVAN) 0.5 MG tablet, Take 1 tablet (0.5 mg total) by mouth every 8 (eight) hours., Disp: 30 tablet, Rfl: 2 .  metoprolol succinate (TOPROL-XL) 100 MG 24 hr tablet, Take 100 mg by mouth daily. , Disp: , Rfl:  .  ondansetron (ZOFRAN) 8 MG tablet, Take 1 tablet (8 mg total) by mouth 2 (two) times daily. Start the day after chemo for 2 days. Then take as needed for nausea or vomiting., Disp: 30 tablet, Rfl: 1 .  oxyCODONE (OXY IR/ROXICODONE) 5 MG immediate release tablet, Take 1-2 tablets (5-10 mg total) by mouth every 6 (six) hours as needed for severe pain., Disp: 90  tablet, Rfl: 0 .  spironolactone (ALDACTONE) 25 MG tablet, Take 1 tablet (25 mg total) by mouth once., Disp: 90 tablet, Rfl: 3 .  Vitamin D, Ergocalciferol, (DRISDOL) 50000 UNITS CAPS capsule, Take 50,000 Units by mouth every 7 (seven) days. Every thursday, Disp: , Rfl:  .  prochlorperazine (COMPAZINE) 10 MG tablet, Take 1 tablet (10 mg total) by mouth every 6 (six) hours as needed (Nausea or vomiting). (Patient not taking: Reported on 12/01/2014), Disp: 30 tablet, Rfl: 1  Allergies:  Allergies  Allergen Reactions  . Tylenol [Acetaminophen] Other (See Comments)    Messes with stomach    Past Medical History, Surgical history, Social history, and Family History were reviewed and updated.  Review of Systems: As above  Physical  Exam:  height is '5\' 4"'  (1.626 m) and weight is 211 lb (95.709 kg). Her oral temperature is 98.2 F (36.8 C). Her blood pressure is 220/102 and her pulse is 79. Her respiration is 16.   Well-developed and well-nourished African-American female. She is mildly obese. Head and neck exam shows no cervical or subclavicular lymph nodes. There is no mucositis. She has no scleral icterus. Lungs are clear. Cardiac exam regular rate and rhythm with no murmurs, rubs or bruits. Axillary exam shows no axillary adenopathy. Abdomen is soft. She is moderately obese. She has good bowel sounds. There is no palpable liver or spleen tip. Back exam shows no tenderness over the spine, ribs or hips. Extremities shows no clubbing, cyanosis or edema. She has good range of motion of her joints. She has good strength in her arms and legs. Skin exam no rashes, ecchymoses or petechia. Neurological exam is nonfocal.  Lab Results  Component Value Date   WBC 2.4* 12/01/2014   HGB 12.3 12/01/2014   HCT 37.1 12/01/2014   MCV 72* 12/01/2014   PLT 154 Platelet count confirmed by slide estimate 12/01/2014     Chemistry      Component Value Date/Time   NA 142 12/01/2014 0836   NA 136 11/16/2014 1209   K 3.8 12/01/2014 0836   K 3.2* 11/16/2014 1209   CL 98 12/01/2014 0836   CL 101 11/16/2014 1209   CO2 28 12/01/2014 0836   CO2 28 11/16/2014 1209   BUN 8 12/01/2014 0836   BUN 7 11/16/2014 1209   CREATININE 1.0 12/01/2014 0836   CREATININE 0.75 11/16/2014 1209      Component Value Date/Time   CALCIUM 11.2* 12/01/2014 0836   CALCIUM 9.7 11/16/2014 1209   ALKPHOS 58 12/01/2014 0836   ALKPHOS 52 11/16/2014 1209   AST 24 12/01/2014 0836   AST 11 11/16/2014 1209   ALT 18 12/01/2014 0836   ALT <8 11/16/2014 1209   BILITOT 0.70 12/01/2014 0836   BILITOT 0.4 11/16/2014 1209         Impression and Plan: Megan Tate is 69 year old African-American female with chronic lymphocytic leukemia. We had to treat her as her white  cell count was going up and her lymphadenopathy was worsening.  She's had a fantastic response. I will think that she should do well given that she has the trisomy 12 chromosomal abnormalities, but this should be a better marker for her.   I do not think we had to do any scans on her until after he done with all of her treatments.  I spent about 25-30 minutes with she and her husband. I went over her labs and CT scans and the plan that we have for her.  We'll get her back in one month.  I think she probably will need 6 cycles of therapy.   Volanda Napoleon, MD 3/2/20169:36 AM

## 2014-12-02 ENCOUNTER — Ambulatory Visit (HOSPITAL_BASED_OUTPATIENT_CLINIC_OR_DEPARTMENT_OTHER): Payer: Medicare Other

## 2014-12-02 DIAGNOSIS — C9110 Chronic lymphocytic leukemia of B-cell type not having achieved remission: Secondary | ICD-10-CM

## 2014-12-02 MED ORDER — DEXAMETHASONE SODIUM PHOSPHATE 10 MG/ML IJ SOLN
10.0000 mg | Freq: Once | INTRAMUSCULAR | Status: AC
  Administered 2014-12-02: 10 mg via INTRAVENOUS

## 2014-12-02 MED ORDER — ONDANSETRON 8 MG/50ML IVPB (CHCC)
8.0000 mg | Freq: Once | INTRAVENOUS | Status: AC
Start: 2014-12-02 — End: 2014-12-02
  Administered 2014-12-02: 8 mg via INTRAVENOUS

## 2014-12-02 MED ORDER — SODIUM CHLORIDE 0.9 % IJ SOLN
10.0000 mL | INTRAMUSCULAR | Status: DC | PRN
  Administered 2014-12-02: 10 mL
  Filled 2014-12-02: qty 10

## 2014-12-02 MED ORDER — HEPARIN SOD (PORK) LOCK FLUSH 100 UNIT/ML IV SOLN
500.0000 [IU] | Freq: Once | INTRAVENOUS | Status: AC | PRN
  Administered 2014-12-02: 500 [IU]
  Filled 2014-12-02: qty 5

## 2014-12-02 MED ORDER — DEXAMETHASONE SODIUM PHOSPHATE 10 MG/ML IJ SOLN
INTRAMUSCULAR | Status: AC
  Filled 2014-12-02: qty 1

## 2014-12-02 MED ORDER — SODIUM CHLORIDE 0.9 % IV SOLN
Freq: Once | INTRAVENOUS | Status: AC
  Administered 2014-12-02: 10:00:00 via INTRAVENOUS

## 2014-12-02 MED ORDER — SODIUM CHLORIDE 0.9 % IV SOLN
90.0000 mg/m2 | Freq: Once | INTRAVENOUS | Status: AC
  Administered 2014-12-02: 198 mg via INTRAVENOUS
  Filled 2014-12-02: qty 0.2

## 2014-12-02 NOTE — Patient Instructions (Signed)
[facility] Discharge Instructions for Patients Receiving Chemotherapy  Today you received the following chemotherapy agents: Treanda To help prevent nausea and vomiting after your treatment, we encourage you to take your nausea medications as directed.   If you develop nausea and vomiting that is not controlled by your nausea medication, call the clinic.   BELOW ARE SYMPTOMS THAT SHOULD BE REPORTED IMMEDIATELY:  *FEVER GREATER THAN 100.5 F  *CHILLS WITH OR WITHOUT FEVER  NAUSEA AND VOMITING THAT IS NOT CONTROLLED WITH YOUR NAUSEA MEDICATION  *UNUSUAL SHORTNESS OF BREATH  *UNUSUAL BRUISING OR BLEEDING  TENDERNESS IN MOUTH AND THROAT WITH OR WITHOUT PRESENCE OF ULCERS  *URINARY PROBLEMS  *BOWEL PROBLEMS  UNUSUAL RASH Items with * indicate a potential emergency and should be followed up as soon as possible.  Feel free to call the clinic you have any questions or concerns. The clinic phone number is [phone].

## 2014-12-06 ENCOUNTER — Telehealth: Payer: Self-pay | Admitting: *Deleted

## 2014-12-06 DIAGNOSIS — C9110 Chronic lymphocytic leukemia of B-cell type not having achieved remission: Secondary | ICD-10-CM

## 2014-12-06 MED ORDER — METOCLOPRAMIDE HCL 10 MG PO TABS: 10.0000 mg | ORAL_TABLET | Freq: Three times a day (TID) | ORAL | Status: DC

## 2014-12-06 NOTE — Telephone Encounter (Signed)
Patient's husband called stating patient has nausea which in unresponsive to her current meds. Patient has been nauseous since 3am, with one episode of vomiting. She is still drinking and eating some. She also c/o constipation. Relayed symptoms to Dr Marin Olp. Will send in prescription for reglan and have patient start miralax.

## 2014-12-07 ENCOUNTER — Telehealth: Payer: Self-pay | Admitting: *Deleted

## 2014-12-07 NOTE — Telephone Encounter (Signed)
Husband called the office asking if SOB was associated with Reglan. He states that patient has been SOB a few times since yesterday. She started taking reglan yesterday. She does not feel like she can't breath, but when she lies down she feels like she's not breathing as well. Has had a few episode in the last day. He was told that this was most likely not the reglan, but that any time someone had issues with breathing they should be worked up in the Emergency Room. He didn't feel like it was an emergent symptom. Each episode resolved quickly.   This will be reviewed with Dr Marin Olp in the am. Patient was once again informed about Emergency workup with any episode where patient felt like their breathing was compromised. He stated he would bring patient to the emergency room if situation warranted it.  Will call back patient in the am once Dr Marin Olp is aware of symptoms.

## 2014-12-08 ENCOUNTER — Telehealth: Payer: Self-pay | Admitting: *Deleted

## 2014-12-08 ENCOUNTER — Other Ambulatory Visit: Payer: Self-pay | Admitting: *Deleted

## 2014-12-08 DIAGNOSIS — C9110 Chronic lymphocytic leukemia of B-cell type not having achieved remission: Secondary | ICD-10-CM

## 2014-12-08 NOTE — Telephone Encounter (Signed)
Spoke to Dr Marin Olp regarding patients symptoms. He ordered a Chest Xray to r/o any disease process. Called and spoke to patient to check on her after the phone call yesterday complaining of shortness of breath. She says she feels better this morning. Told her to come into the radiology department today for a chest xray which she is planning to do. She also mentioned that she hadn't been taking her 'fluid pill' for a few days and that she restarted it last night. Explained to her that that was most likely the cause to her SOB but to follow up with her chest xray regardless. She is in agreement with plan.

## 2014-12-14 ENCOUNTER — Other Ambulatory Visit: Payer: Self-pay | Admitting: Family

## 2014-12-23 ENCOUNTER — Encounter: Payer: Self-pay | Admitting: Pharmacist

## 2014-12-29 ENCOUNTER — Ambulatory Visit (HOSPITAL_BASED_OUTPATIENT_CLINIC_OR_DEPARTMENT_OTHER): Payer: Medicare Other

## 2014-12-29 ENCOUNTER — Other Ambulatory Visit (HOSPITAL_BASED_OUTPATIENT_CLINIC_OR_DEPARTMENT_OTHER): Payer: Medicare Other

## 2014-12-29 ENCOUNTER — Encounter: Payer: Self-pay | Admitting: Hematology & Oncology

## 2014-12-29 ENCOUNTER — Ambulatory Visit (HOSPITAL_BASED_OUTPATIENT_CLINIC_OR_DEPARTMENT_OTHER): Payer: Medicare Other | Admitting: Hematology & Oncology

## 2014-12-29 VITALS — BP 183/88 | HR 81 | Temp 98.2°F | Resp 18 | Wt 211.0 lb

## 2014-12-29 DIAGNOSIS — C9110 Chronic lymphocytic leukemia of B-cell type not having achieved remission: Secondary | ICD-10-CM

## 2014-12-29 DIAGNOSIS — Z5112 Encounter for antineoplastic immunotherapy: Secondary | ICD-10-CM

## 2014-12-29 DIAGNOSIS — Z5111 Encounter for antineoplastic chemotherapy: Secondary | ICD-10-CM

## 2014-12-29 LAB — CBC WITH DIFFERENTIAL (CANCER CENTER ONLY)
BASO#: 0 10*3/uL (ref 0.0–0.2)
BASO%: 1 % (ref 0.0–2.0)
EOS%: 1.2 % (ref 0.0–7.0)
Eosinophils Absolute: 0.1 10*3/uL (ref 0.0–0.5)
HCT: 32.8 % — ABNORMAL LOW (ref 34.8–46.6)
HEMOGLOBIN: 10.8 g/dL — AB (ref 11.6–15.9)
LYMPH#: 2.5 10*3/uL (ref 0.9–3.3)
LYMPH%: 62.5 % — AB (ref 14.0–48.0)
MCH: 23.7 pg — ABNORMAL LOW (ref 26.0–34.0)
MCHC: 32.9 g/dL (ref 32.0–36.0)
MCV: 72 fL — ABNORMAL LOW (ref 81–101)
MONO#: 0.7 10*3/uL (ref 0.1–0.9)
MONO%: 16.5 % — AB (ref 0.0–13.0)
NEUT%: 18.8 % — ABNORMAL LOW (ref 39.6–80.0)
NEUTROS ABS: 0.8 10*3/uL — AB (ref 1.5–6.5)
Platelets: 139 10*3/uL — ABNORMAL LOW (ref 145–400)
RBC: 4.55 10*6/uL (ref 3.70–5.32)
RDW: 15 % (ref 11.1–15.7)
WBC: 4.1 10*3/uL (ref 3.9–10.0)

## 2014-12-29 LAB — CMP (CANCER CENTER ONLY)
ALT(SGPT): 10 U/L (ref 10–47)
AST: 15 U/L (ref 11–38)
Albumin: 3.4 g/dL (ref 3.3–5.5)
Alkaline Phosphatase: 77 U/L (ref 26–84)
BUN: 9 mg/dL (ref 7–22)
CO2: 30 mEq/L (ref 18–33)
CREATININE: 0.8 mg/dL (ref 0.6–1.2)
Calcium: 10.5 mg/dL — ABNORMAL HIGH (ref 8.0–10.3)
Chloride: 104 mEq/L (ref 98–108)
Glucose, Bld: 258 mg/dL — ABNORMAL HIGH (ref 73–118)
Potassium: 3.7 mEq/L (ref 3.3–4.7)
Sodium: 135 mEq/L (ref 128–145)
TOTAL PROTEIN: 5.9 g/dL — AB (ref 6.4–8.1)
Total Bilirubin: 0.6 mg/dl (ref 0.20–1.60)

## 2014-12-29 LAB — CHCC SATELLITE - SMEAR

## 2014-12-29 LAB — LACTATE DEHYDROGENASE: LDH: 146 U/L (ref 94–250)

## 2014-12-29 MED ORDER — SODIUM CHLORIDE 0.9 % IV SOLN
Freq: Once | INTRAVENOUS | Status: AC
  Administered 2014-12-29: 10:00:00 via INTRAVENOUS
  Filled 2014-12-29: qty 5

## 2014-12-29 MED ORDER — FAMOTIDINE IN NACL 20-0.9 MG/50ML-% IV SOLN
40.0000 mg | Freq: Once | INTRAVENOUS | Status: AC
  Administered 2014-12-29: 40 mg via INTRAVENOUS

## 2014-12-29 MED ORDER — IBUPROFEN 200 MG PO TABS
ORAL_TABLET | ORAL | Status: AC
  Filled 2014-12-29: qty 2

## 2014-12-29 MED ORDER — SODIUM CHLORIDE 0.9 % IJ SOLN
10.0000 mL | INTRAMUSCULAR | Status: DC | PRN
  Administered 2014-12-29: 10 mL
  Filled 2014-12-29: qty 10

## 2014-12-29 MED ORDER — SODIUM CHLORIDE 0.9 % IV SOLN
1000.0000 mg | Freq: Once | INTRAVENOUS | Status: AC
  Administered 2014-12-29: 1000 mg via INTRAVENOUS
  Filled 2014-12-29: qty 40

## 2014-12-29 MED ORDER — SODIUM CHLORIDE 0.9 % IV SOLN
90.0000 mg/m2 | Freq: Once | INTRAVENOUS | Status: AC
  Administered 2014-12-29: 198 mg via INTRAVENOUS
  Filled 2014-12-29: qty 2.2

## 2014-12-29 MED ORDER — SODIUM CHLORIDE 0.9 % IV SOLN
Freq: Once | INTRAVENOUS | Status: AC
  Administered 2014-12-29: 10:00:00 via INTRAVENOUS

## 2014-12-29 MED ORDER — SODIUM CHLORIDE 0.9 % IV SOLN
Freq: Once | INTRAVENOUS | Status: AC
  Administered 2014-12-29: 11:00:00 via INTRAVENOUS
  Filled 2014-12-29: qty 4

## 2014-12-29 MED ORDER — DIPHENHYDRAMINE HCL 50 MG/ML IJ SOLN
50.0000 mg | Freq: Once | INTRAMUSCULAR | Status: AC
  Administered 2014-12-29: 50 mg via INTRAVENOUS

## 2014-12-29 MED ORDER — HEPARIN SOD (PORK) LOCK FLUSH 100 UNIT/ML IV SOLN
500.0000 [IU] | Freq: Once | INTRAVENOUS | Status: AC | PRN
  Administered 2014-12-29: 500 [IU]
  Filled 2014-12-29: qty 5

## 2014-12-29 MED ORDER — DIPHENHYDRAMINE HCL 50 MG/ML IJ SOLN
INTRAMUSCULAR | Status: AC
  Filled 2014-12-29: qty 1

## 2014-12-29 MED ORDER — IBUPROFEN 200 MG PO TABS
400.0000 mg | ORAL_TABLET | Freq: Once | ORAL | Status: AC
  Administered 2014-12-29: 400 mg via ORAL

## 2014-12-29 MED ORDER — FAMOTIDINE IN NACL 20-0.9 MG/50ML-% IV SOLN
INTRAVENOUS | Status: AC
  Filled 2014-12-29: qty 100

## 2014-12-29 NOTE — Patient Instructions (Addendum)
[facility] Discharge Instructions for Patients Receiving Chemotherapy  Today you received the following chemotherapy agents Treanda and Gazyva.  To help prevent nausea and vomiting after your treatment, we encourage you to take your nausea medication.   If you develop nausea and vomiting that is not controlled by your nausea medication, call the clinic.   BELOW ARE SYMPTOMS THAT SHOULD BE REPORTED IMMEDIATELY:  *FEVER GREATER THAN 100.5 F  *CHILLS WITH OR WITHOUT FEVER  NAUSEA AND VOMITING THAT IS NOT CONTROLLED WITH YOUR NAUSEA MEDICATION  *UNUSUAL SHORTNESS OF BREATH  *UNUSUAL BRUISING OR BLEEDING  TENDERNESS IN MOUTH AND THROAT WITH OR WITHOUT PRESENCE OF ULCERS  *URINARY PROBLEMS  *BOWEL PROBLEMS  UNUSUAL RASH Items with * indicate a potential emergency and should be followed up as soon as possible.  Feel free to call the clinic you have any questions or concerns. The clinic phone number is [phone].  Please show the [facility] at check-in to the Emergency Department and triage nurse.

## 2014-12-29 NOTE — Progress Notes (Signed)
Hematology and Oncology Follow Up Visit  Megan Tate [id number] August 05, 1946 69 y.o. 12/29/2014   Principle Diagnosis:   Chronic lymphocytic leukemia- Trisomy 12  Current Therapy:   Status post 4 cycles of Gazyva/Bendamustine    Interim History:  [name] is back for follow-up. She really looks great. She had a really good Easter. She and her husband are quite busy Easter weekend. She did eat quite a bit. However, she try to watch her sugars.  She really did not have a lot of nausea or vomiting with the last cycle of chemotherapy. He started her on some Ativan before meals. This seems to have helped quite a bit.  She's had no fever. She's had no change in bowel or bladder habits. She will constipated but this is resolved.   Overall, her performance status is ECOG 1.  Medications:  Current outpatient prescriptions:  .  allopurinol (ZYLOPRIM) 100 MG tablet, TAKE ONE TABLET BY MOUTH ONE TIME DAILY , Disp: 30 tablet, Rfl: 4 .  bisacodyl (DULCOLAX) 5 MG EC tablet, Take 1 tablet (5 mg total) by mouth 2 (two) times daily as needed for moderate constipation., Disp: 60 tablet, Rfl: 0 .  Blood Glucose Monitoring Suppl (ONE TOUCH ULTRA SYSTEM KIT) W/DEVICE KIT, 1 kit by Does not apply route once. Check blood sugar daily before meals and at bedtime., Disp: 1 each, Rfl: 0 .  cloNIDine (CATAPRES) 0.2 MG tablet, Take 0.2 mg by mouth 2 (two) times daily. , Disp: , Rfl:  .  COLCRYS 0.6 MG tablet, Take 0.6 mg by mouth as needed. , Disp: , Rfl:  .  CRESTOR 10 MG tablet, Take 10 mg by mouth daily. , Disp: , Rfl:  .  dorzolamide-timolol (COSOPT) 22.3-6.8 MG/ML ophthalmic solution, Place 1 drop into both eyes 2 (two) times daily. , Disp: , Rfl:  .  EDARBI 80 MG TABS, Take by mouth every morning. , Disp: , Rfl:  .  famciclovir (FAMVIR) 500 MG tablet, Take 1 tablet (500 mg total) by mouth daily., Disp: 30 tablet, Rfl: 8 .  fluconazole (DIFLUCAN) 200 MG tablet, TAKE ONE TABLET BY MOUTH ONE TIME DAILY , Disp:  5 tablet, Rfl: 1 .  glucose blood test strip, Monitor blood glucose daily before every meal and at bedtime, Disp: 100 each, Rfl: 12 .  KOMBIGLYZE XR 2.01-999 MG TB24, Take by mouth every morning. , Disp: , Rfl:  .  LEVEMIR FLEXTOUCH 100 UNIT/ML Pen, Inject 10 Units into the skin every morning. , Disp: , Rfl:  .  levofloxacin (LEVAQUIN) 500 MG tablet, Take 1 tablet (500 mg total) by mouth daily., Disp: 10 tablet, Rfl: 0 .  lidocaine-prilocaine (EMLA) cream, Apply to Port-A-Cath site 1 hour ride to treatment, Disp: 30 g, Rfl: 4 .  LORazepam (ATIVAN) 0.5 MG tablet, Take 1 tablet (0.5 mg total) by mouth every 8 (eight) hours., Disp: 30 tablet, Rfl: 2 .  metoCLOPramide (REGLAN) 10 MG tablet, Take 1 tablet (10 mg total) by mouth 3 (three) times daily before meals., Disp: 90 tablet, Rfl: 2 .  metoprolol succinate (TOPROL-XL) 100 MG 24 hr tablet, Take 100 mg by mouth daily. , Disp: , Rfl:  .  ondansetron (ZOFRAN) 8 MG tablet, Take 1 tablet (8 mg total) by mouth 2 (two) times daily. Start the day after chemo for 2 days. Then take as needed for nausea or vomiting., Disp: 30 tablet, Rfl: 1 .  oxyCODONE (OXY IR/ROXICODONE) 5 MG immediate release tablet, Take 1-2 tablets (5-10 mg  total) by mouth every 6 (six) hours as needed for severe pain., Disp: 90 tablet, Rfl: 0 .  prochlorperazine (COMPAZINE) 10 MG tablet, Take 1 tablet (10 mg total) by mouth every 6 (six) hours as needed (Nausea or vomiting). (Patient not taking: Reported on 12/01/2014), Disp: 30 tablet, Rfl: 1 .  spironolactone (ALDACTONE) 25 MG tablet, Take 1 tablet (25 mg total) by mouth once., Disp: 90 tablet, Rfl: 3 .  Vitamin D, Ergocalciferol, (DRISDOL) 50000 UNITS CAPS capsule, Take 50,000 Units by mouth every 7 (seven) days. Every thursday, Disp: , Rfl:   Allergies:  Allergies  Allergen Reactions  . Tylenol [Acetaminophen] Other (See Comments)    Messes with stomach    Past Medical History, Surgical history, Social history, and Family History  were reviewed and updated.  Review of Systems: As above  Physical Exam:  weight is 211 lb (95.709 kg). Her oral temperature is 98.2 F (36.8 C). Her blood pressure is 183/88 and her pulse is 81. Her respiration is 18.   Well-developed and well-nourished African-American female. She is mildly obese. Head and neck exam shows no cervical or subclavicular lymph nodes. There is no mucositis. She has no scleral icterus. Lungs are clear. Cardiac exam regular rate and rhythm with no murmurs, rubs or bruits. Axillary exam shows no axillary adenopathy. Abdomen is soft. She is moderately obese. She has good bowel sounds. There is no palpable liver or spleen tip. Back exam shows no tenderness over the spine, ribs or hips. Extremities shows no clubbing, cyanosis or edema. She has good range of motion of her joints. She has good strength in her arms and legs. Skin exam no rashes, ecchymoses or petechia. Neurological exam is nonfocal.  Lab Results  Component Value Date   WBC 4.1 12/29/2014   HGB 10.8* 12/29/2014   HCT 32.8* 12/29/2014   MCV 72* 12/29/2014   PLT 139* 12/29/2014     Chemistry      Component Value Date/Time   NA 142 12/01/2014 0836   NA 136 11/16/2014 1209   K 3.8 12/01/2014 0836   K 3.2* 11/16/2014 1209   CL 98 12/01/2014 0836   CL 101 11/16/2014 1209   CO2 28 12/01/2014 0836   CO2 28 11/16/2014 1209   BUN 8 12/01/2014 0836   BUN 7 11/16/2014 1209   CREATININE 1.0 12/01/2014 0836   CREATININE 0.75 11/16/2014 1209      Component Value Date/Time   CALCIUM 11.2* 12/01/2014 0836   CALCIUM 9.7 11/16/2014 1209   ALKPHOS 58 12/01/2014 0836   ALKPHOS 52 11/16/2014 1209   AST 24 12/01/2014 0836   AST 11 11/16/2014 1209   ALT 18 12/01/2014 0836   ALT <8 11/16/2014 1209   BILITOT 0.70 12/01/2014 0836   BILITOT 0.4 11/16/2014 1209         Impression and Plan: Megan Tate is 69 year old African-American female with chronic lymphocytic leukemia. We had to treat her as her white  cell count was going up and her lymphadenopathy was worsening.  She's had a fantastic response. I will think that she should do well given that she has the trisomy 12 chromosomal abnormalities, but this should be a better marker for her.   I do not think we had to do any scans on her until after he done with all of her treatments.  I spent about 25-30 minutes with she and her husband. I went over her labs and CT scans and the plan that we have for her.  We'll get her back in one month.  I think she probably will need 6 cycles of therapy.   Volanda Napoleon, MD 3/30/20169:29 AM

## 2014-12-30 ENCOUNTER — Ambulatory Visit (HOSPITAL_BASED_OUTPATIENT_CLINIC_OR_DEPARTMENT_OTHER): Payer: Medicare Other

## 2014-12-30 DIAGNOSIS — Z5111 Encounter for antineoplastic chemotherapy: Secondary | ICD-10-CM

## 2014-12-30 DIAGNOSIS — C9110 Chronic lymphocytic leukemia of B-cell type not having achieved remission: Secondary | ICD-10-CM | POA: Diagnosis not present

## 2014-12-30 MED ORDER — SODIUM CHLORIDE 0.9 % IV SOLN
Freq: Once | INTRAVENOUS | Status: AC
  Administered 2014-12-30: 10:00:00 via INTRAVENOUS
  Filled 2014-12-30: qty 4

## 2014-12-30 MED ORDER — SODIUM CHLORIDE 0.9 % IJ SOLN
10.0000 mL | INTRAMUSCULAR | Status: DC | PRN
  Administered 2014-12-30: 10 mL
  Filled 2014-12-30: qty 10

## 2014-12-30 MED ORDER — HEPARIN SOD (PORK) LOCK FLUSH 100 UNIT/ML IV SOLN
250.0000 [IU] | Freq: Once | INTRAVENOUS | Status: DC | PRN
  Filled 2014-12-30: qty 5

## 2014-12-30 MED ORDER — SODIUM CHLORIDE 0.9 % IJ SOLN
3.0000 mL | INTRAMUSCULAR | Status: DC | PRN
  Filled 2014-12-30: qty 10

## 2014-12-30 MED ORDER — SODIUM CHLORIDE 0.9 % IV SOLN
95.0000 mg/m2 | Freq: Once | INTRAVENOUS | Status: AC
  Administered 2014-12-30: 180 mg via INTRAVENOUS
  Filled 2014-12-30: qty 2

## 2014-12-30 MED ORDER — HEPARIN SOD (PORK) LOCK FLUSH 100 UNIT/ML IV SOLN
500.0000 [IU] | Freq: Once | INTRAVENOUS | Status: AC | PRN
  Administered 2014-12-30: 500 [IU]
  Filled 2014-12-30: qty 5

## 2014-12-30 MED ORDER — ALTEPLASE 2 MG IJ SOLR
2.0000 mg | Freq: Once | INTRAMUSCULAR | Status: DC | PRN
  Filled 2014-12-30: qty 2

## 2014-12-30 MED ORDER — SODIUM CHLORIDE 0.9 % IV SOLN
Freq: Once | INTRAVENOUS | Status: AC
  Administered 2014-12-30: 10:00:00 via INTRAVENOUS

## 2014-12-30 NOTE — Patient Instructions (Signed)
[facility] Discharge Instructions for Patients Receiving Chemotherapy  Today you received the following chemotherapy agents Treanda   To help prevent nausea and vomiting after your treatment, we encourage you to take your nausea medication   If you develop nausea and vomiting that is not controlled by your nausea medication, call the clinic.   BELOW ARE SYMPTOMS THAT SHOULD BE REPORTED IMMEDIATELY:  *FEVER GREATER THAN 100.5 F  *CHILLS WITH OR WITHOUT FEVER  NAUSEA AND VOMITING THAT IS NOT CONTROLLED WITH YOUR NAUSEA MEDICATION  *UNUSUAL SHORTNESS OF BREATH  *UNUSUAL BRUISING OR BLEEDING  TENDERNESS IN MOUTH AND THROAT WITH OR WITHOUT PRESENCE OF ULCERS  *URINARY PROBLEMS  *BOWEL PROBLEMS  UNUSUAL RASH Items with * indicate a potential emergency and should be followed up as soon as possible.  Feel free to call the clinic you have any questions or concerns. The clinic phone number is [phone].  Please show the CHEMO ALERT CARD at check-in to the Emergency Department and triage nurse.   

## 2015-01-14 ENCOUNTER — Other Ambulatory Visit: Payer: Self-pay | Admitting: Nurse Practitioner

## 2015-01-14 MED ORDER — ESOMEPRAZOLE MAGNESIUM 20 MG PO CPDR: 20.0000 mg | DELAYED_RELEASE_CAPSULE | Freq: Two times a day (BID) | ORAL | Status: DC

## 2015-01-14 MED ORDER — FLUCONAZOLE 200 MG PO TABS: 200.0000 mg | ORAL_TABLET | Freq: Every day | ORAL | Status: DC

## 2015-01-26 ENCOUNTER — Other Ambulatory Visit (HOSPITAL_BASED_OUTPATIENT_CLINIC_OR_DEPARTMENT_OTHER): Payer: Medicare Other

## 2015-01-26 ENCOUNTER — Ambulatory Visit (HOSPITAL_BASED_OUTPATIENT_CLINIC_OR_DEPARTMENT_OTHER): Payer: Medicare Other

## 2015-01-26 ENCOUNTER — Encounter: Payer: Self-pay | Admitting: Hematology & Oncology

## 2015-01-26 ENCOUNTER — Ambulatory Visit (HOSPITAL_BASED_OUTPATIENT_CLINIC_OR_DEPARTMENT_OTHER): Payer: Medicare Other | Admitting: Hematology & Oncology

## 2015-01-26 VITALS — BP 183/83 | HR 92 | Temp 97.4°F | Resp 20

## 2015-01-26 VITALS — BP 209/84 | HR 81 | Temp 98.6°F | Resp 16 | Ht 64.0 in | Wt 209.0 lb

## 2015-01-26 DIAGNOSIS — C9110 Chronic lymphocytic leukemia of B-cell type not having achieved remission: Secondary | ICD-10-CM

## 2015-01-26 DIAGNOSIS — Z5112 Encounter for antineoplastic immunotherapy: Secondary | ICD-10-CM | POA: Diagnosis present

## 2015-01-26 DIAGNOSIS — Z5111 Encounter for antineoplastic chemotherapy: Secondary | ICD-10-CM | POA: Diagnosis not present

## 2015-01-26 LAB — CBC WITH DIFFERENTIAL (CANCER CENTER ONLY)
BASO#: 0 10*3/uL (ref 0.0–0.2)
BASO%: 0.4 % (ref 0.0–2.0)
EOS%: 1.8 % (ref 0.0–7.0)
Eosinophils Absolute: 0.1 10*3/uL (ref 0.0–0.5)
HCT: 32.6 % — ABNORMAL LOW (ref 34.8–46.6)
HGB: 11 g/dL — ABNORMAL LOW (ref 11.6–15.9)
LYMPH#: 1.4 10*3/uL (ref 0.9–3.3)
LYMPH%: 48.6 % — AB (ref 14.0–48.0)
MCH: 24.3 pg — ABNORMAL LOW (ref 26.0–34.0)
MCHC: 33.7 g/dL (ref 32.0–36.0)
MCV: 72 fL — ABNORMAL LOW (ref 81–101)
MONO#: 0.7 10*3/uL (ref 0.1–0.9)
MONO%: 26.1 % — ABNORMAL HIGH (ref 0.0–13.0)
NEUT#: 0.7 10*3/uL — ABNORMAL LOW (ref 1.5–6.5)
NEUT%: 23.1 % — ABNORMAL LOW (ref 39.6–80.0)
PLATELETS: 185 10*3/uL (ref 145–400)
RBC: 4.53 10*6/uL (ref 3.70–5.32)
RDW: 15 % (ref 11.1–15.7)
WBC: 2.8 10*3/uL — ABNORMAL LOW (ref 3.9–10.0)

## 2015-01-26 LAB — CMP (CANCER CENTER ONLY)
ALT(SGPT): 17 U/L (ref 10–47)
AST: 18 U/L (ref 11–38)
Albumin: 3.8 g/dL (ref 3.3–5.5)
Alkaline Phosphatase: 76 U/L (ref 26–84)
BILIRUBIN TOTAL: 0.7 mg/dL (ref 0.20–1.60)
BUN, Bld: 8 mg/dL (ref 7–22)
CO2: 30 mEq/L (ref 18–33)
CREATININE: 0.9 mg/dL (ref 0.6–1.2)
Calcium: 11 mg/dL — ABNORMAL HIGH (ref 8.0–10.3)
Chloride: 99 mEq/L (ref 98–108)
Glucose, Bld: 317 mg/dL — ABNORMAL HIGH (ref 73–118)
Potassium: 3.6 mEq/L (ref 3.3–4.7)
Sodium: 138 mEq/L (ref 128–145)
Total Protein: 6.4 g/dL (ref 6.4–8.1)

## 2015-01-26 LAB — LACTATE DEHYDROGENASE: LDH: 147 U/L (ref 94–250)

## 2015-01-26 MED ORDER — PROMETHAZINE HCL 25 MG/ML IJ SOLN
INTRAMUSCULAR | Status: AC
  Filled 2015-01-26: qty 1

## 2015-01-26 MED ORDER — SODIUM CHLORIDE 0.9 % IV SOLN
91.0000 mg/m2 | Freq: Once | INTRAVENOUS | Status: AC
  Administered 2015-01-26: 200 mg via INTRAVENOUS
  Filled 2015-01-26: qty 8

## 2015-01-26 MED ORDER — SODIUM CHLORIDE 0.9 % IV SOLN
Freq: Once | INTRAVENOUS | Status: AC
  Administered 2015-01-26: 11:00:00 via INTRAVENOUS
  Filled 2015-01-26: qty 5

## 2015-01-26 MED ORDER — FAMOTIDINE IN NACL 20-0.9 MG/50ML-% IV SOLN
40.0000 mg | Freq: Once | INTRAVENOUS | Status: AC
  Administered 2015-01-26: 40 mg via INTRAVENOUS

## 2015-01-26 MED ORDER — IBUPROFEN 200 MG PO TABS
ORAL_TABLET | ORAL | Status: AC
  Filled 2015-01-26: qty 2

## 2015-01-26 MED ORDER — FAMOTIDINE IN NACL 20-0.9 MG/50ML-% IV SOLN
INTRAVENOUS | Status: AC
  Filled 2015-01-26: qty 100

## 2015-01-26 MED ORDER — DIPHENHYDRAMINE HCL 50 MG/ML IJ SOLN
INTRAMUSCULAR | Status: AC
  Filled 2015-01-26: qty 1

## 2015-01-26 MED ORDER — SODIUM CHLORIDE 0.9 % IV SOLN
Freq: Once | INTRAVENOUS | Status: AC
  Administered 2015-01-26: 11:00:00 via INTRAVENOUS

## 2015-01-26 MED ORDER — IBUPROFEN 200 MG PO TABS
400.0000 mg | ORAL_TABLET | Freq: Once | ORAL | Status: AC
  Administered 2015-01-26: 400 mg via ORAL

## 2015-01-26 MED ORDER — DIPHENHYDRAMINE HCL 50 MG/ML IJ SOLN
50.0000 mg | Freq: Once | INTRAMUSCULAR | Status: AC
  Administered 2015-01-26: 50 mg via INTRAVENOUS

## 2015-01-26 MED ORDER — HEPARIN SOD (PORK) LOCK FLUSH 100 UNIT/ML IV SOLN
500.0000 [IU] | Freq: Once | INTRAVENOUS | Status: AC | PRN
  Administered 2015-01-26: 500 [IU]
  Filled 2015-01-26: qty 5

## 2015-01-26 MED ORDER — SODIUM CHLORIDE 0.9 % IV SOLN
1000.0000 mg | Freq: Once | INTRAVENOUS | Status: AC
  Administered 2015-01-26: 1000 mg via INTRAVENOUS
  Filled 2015-01-26: qty 40

## 2015-01-26 MED ORDER — SODIUM CHLORIDE 0.9 % IV SOLN
Freq: Once | INTRAVENOUS | Status: AC
  Administered 2015-01-26: 11:00:00 via INTRAVENOUS
  Filled 2015-01-26: qty 4

## 2015-01-26 MED ORDER — SODIUM CHLORIDE 0.9 % IV SOLN: 90.0000 mg/m2 | Freq: Once | INTRAVENOUS | Status: DC

## 2015-01-26 MED ORDER — SODIUM CHLORIDE 0.9 % IJ SOLN
10.0000 mL | INTRAMUSCULAR | Status: DC | PRN
  Administered 2015-01-26: 10 mL
  Filled 2015-01-26: qty 10

## 2015-01-26 NOTE — Progress Notes (Signed)
Hematology and Oncology Follow Up Visit  Megan Tate [id number] May 02, 1946 69 y.o. 01/26/2015   Principle Diagnosis:   Chronic lymphocytic leukemia- Trisomy 12  Current Therapy:   Status post 5 cycles of Gazyva/Bendamustine    Interim History:  [name] is back for follow-up. She really looks great. She really had very little toxicity with the last cycle chemotherapy. She has had some nausea but there really has not been any vomiting. She's had no With.  There's been no diarrhea or constipation.  She's had no bleeding.  Her blood sugars have been doing pretty well. She's watching them more attentively.  Overall, her performance status is ECOG 1.  Medications:  Current outpatient prescriptions:  .  allopurinol (ZYLOPRIM) 100 MG tablet, TAKE ONE TABLET BY MOUTH ONE TIME DAILY , Disp: 30 tablet, Rfl: 4 .  bisacodyl (DULCOLAX) 5 MG EC tablet, Take 1 tablet (5 mg total) by mouth 2 (two) times daily as needed for moderate constipation., Disp: 60 tablet, Rfl: 0 .  Blood Glucose Monitoring Suppl (ONE TOUCH ULTRA SYSTEM KIT) W/DEVICE KIT, 1 kit by Does not apply route once. Check blood sugar daily before meals and at bedtime., Disp: 1 each, Rfl: 0 .  cloNIDine (CATAPRES) 0.2 MG tablet, Take 0.2 mg by mouth 2 (two) times daily. , Disp: , Rfl:  .  COLCRYS 0.6 MG tablet, Take 0.6 mg by mouth as needed. , Disp: , Rfl:  .  CRESTOR 10 MG tablet, Take 10 mg by mouth daily. , Disp: , Rfl:  .  dorzolamide-timolol (COSOPT) 22.3-6.8 MG/ML ophthalmic solution, Place 1 drop into both eyes 2 (two) times daily. , Disp: , Rfl:  .  EDARBI 80 MG TABS, Take by mouth every morning. , Disp: , Rfl:  .  esomeprazole (NEXIUM) 20 MG capsule, Take 1 capsule (20 mg total) by mouth 2 (two) times daily before a meal., Disp: 60 capsule, Rfl: 4 .  famciclovir (FAMVIR) 500 MG tablet, Take 1 tablet (500 mg total) by mouth daily., Disp: 30 tablet, Rfl: 8 .  fluconazole (DIFLUCAN) 200 MG tablet, Take 1 tablet (200 mg  total) by mouth daily., Disp: 5 tablet, Rfl: 3 .  glucose blood test strip, Monitor blood glucose daily before every meal and at bedtime, Disp: 100 each, Rfl: 12 .  KOMBIGLYZE XR 2.01-999 MG TB24, Take by mouth every morning. , Disp: , Rfl:  .  LEVEMIR FLEXTOUCH 100 UNIT/ML Pen, Inject 10 Units into the skin every morning. , Disp: , Rfl:  .  levofloxacin (LEVAQUIN) 500 MG tablet, Take 1 tablet (500 mg total) by mouth daily., Disp: 10 tablet, Rfl: 0 .  lidocaine-prilocaine (EMLA) cream, Apply to Port-A-Cath site 1 hour ride to treatment, Disp: 30 g, Rfl: 4 .  LORazepam (ATIVAN) 0.5 MG tablet, Take 1 tablet (0.5 mg total) by mouth every 8 (eight) hours., Disp: 30 tablet, Rfl: 2 .  metoCLOPramide (REGLAN) 10 MG tablet, Take 1 tablet (10 mg total) by mouth 3 (three) times daily before meals., Disp: 90 tablet, Rfl: 2 .  metoprolol succinate (TOPROL-XL) 100 MG 24 hr tablet, Take 100 mg by mouth daily. , Disp: , Rfl:  .  ondansetron (ZOFRAN) 8 MG tablet, Take 1 tablet (8 mg total) by mouth 2 (two) times daily. Start the day after chemo for 2 days. Then take as needed for nausea or vomiting., Disp: 30 tablet, Rfl: 1 .  oxyCODONE (OXY IR/ROXICODONE) 5 MG immediate release tablet, Take 1-2 tablets (5-10 mg total) by mouth  every 6 (six) hours as needed for severe pain., Disp: 90 tablet, Rfl: 0 .  prochlorperazine (COMPAZINE) 10 MG tablet, Take 1 tablet (10 mg total) by mouth every 6 (six) hours as needed (Nausea or vomiting)., Disp: 30 tablet, Rfl: 1 .  spironolactone (ALDACTONE) 25 MG tablet, Take 1 tablet (25 mg total) by mouth once., Disp: 90 tablet, Rfl: 3 .  Vitamin D, Ergocalciferol, (DRISDOL) 50000 UNITS CAPS capsule, Take 50,000 Units by mouth every 7 (seven) days. Every thursday, Disp: , Rfl:  No current facility-administered medications for this visit.  Facility-Administered Medications Ordered in Other Visits:  .  0.9 %  sodium chloride infusion, , Intravenous, Once, Volanda Napoleon, MD .   bendamustine (TREANDA) 198 mg in sodium chloride 0.9 % 500 mL (0.3943 mg/mL) chemo infusion, 90 mg/m2 (Treatment Plan Actual), Intravenous, Once, Volanda Napoleon, MD .  diphenhydrAMINE (BENADRYL) injection 50 mg, 50 mg, Intravenous, Once, Volanda Napoleon, MD .  famotidine (PEPCID) IVPB 40 mg, 40 mg, Intravenous, Once, Volanda Napoleon, MD .  fosaprepitant (EMEND) 150 mg, dexamethasone (DECADRON) 12 mg in sodium chloride 0.9 % 145 mL IVPB, , Intravenous, Once, Volanda Napoleon, MD .  heparin lock flush 100 unit/mL, 500 Units, Intracatheter, Once PRN, Volanda Napoleon, MD .  ibuprofen (ADVIL,MOTRIN) tablet 400 mg, 400 mg, Oral, Once, Volanda Napoleon, MD .  obinutuzumab (GAZYVA) 1,000 mg in sodium chloride 0.9 % 250 mL (3.4483 mg/mL) chemo infusion, 1,000 mg, Intravenous, Once, Volanda Napoleon, MD .  ondansetron (ZOFRAN) 8 mg in sodium chloride 0.9 % 50 mL IVPB, , Intravenous, Once, Volanda Napoleon, MD .  sodium chloride 0.9 % injection 10 mL, 10 mL, Intracatheter, PRN, Volanda Napoleon, MD  Allergies:  Allergies  Allergen Reactions  . Tylenol [Acetaminophen] Other (See Comments)    Messes with stomach    Past Medical History, Surgical history, Social history, and Family History were reviewed and updated.  Review of Systems: As above  Physical Exam:  height is 5' 4" (1.626 m) and weight is 209 lb (94.802 kg). Her oral temperature is 98.6 F (37 C). Her blood pressure is 209/84 and her pulse is 81. Her respiration is 16.   Well-developed and well-nourished African-American female. She is mildly obese. Head and neck exam shows no cervical or subclavicular lymph nodes. There is no mucositis. She has no scleral icterus. Lungs are clear. Cardiac exam regular rate and rhythm with no murmurs, rubs or bruits. Axillary exam shows no axillary adenopathy. Abdomen is soft. She is moderately obese. She has good bowel sounds. There is no palpable liver or spleen tip. Back exam shows no tenderness over the  spine, ribs or hips. Extremities shows no clubbing, cyanosis or edema. She has good range of motion of her joints. She has good strength in her arms and legs. Skin exam no rashes, ecchymoses or petechia. Neurological exam is nonfocal.  Lab Results  Component Value Date   WBC 2.8* 01/26/2015   HGB 11.0* 01/26/2015   HCT 32.6* 01/26/2015   MCV 72* 01/26/2015   PLT 185 01/26/2015     Chemistry      Component Value Date/Time   NA 138 01/26/2015 0911   NA 136 11/16/2014 1209   K 3.6 01/26/2015 0911   K 3.2* 11/16/2014 1209   CL 99 01/26/2015 0911   CL 101 11/16/2014 1209   CO2 30 01/26/2015 0911   CO2 28 11/16/2014 1209   BUN 8 01/26/2015 0911  BUN 7 11/16/2014 1209   CREATININE 0.9 01/26/2015 0911   CREATININE 0.75 11/16/2014 1209      Component Value Date/Time   CALCIUM 11.0* 01/26/2015 0911   CALCIUM 9.7 11/16/2014 1209   ALKPHOS 76 01/26/2015 0911   ALKPHOS 52 11/16/2014 1209   AST 18 01/26/2015 0911   AST 11 11/16/2014 1209   ALT 17 01/26/2015 0911   ALT <8 11/16/2014 1209   BILITOT 0.70 01/26/2015 0911   BILITOT 0.4 11/16/2014 1209         Impression and Plan: Megan Tate is 69 year old African-American female with chronic lymphocytic leukemia. We had to treat her as her white cell count was going up and her lymphadenopathy was worsening.  She's had a fantastic response. I will think that she should do well given that she has the trisomy 12 chromosomal abnormalities, but this should be a better marker for her.  This will be last cycle of chemotherapy.  I think she'll be a great candidate for maintenance therapy with Gazyva. This currently is approved now.  She will need another bone marrow biopsy and aspirate. I will get this set up in 6 weeks. I talked to her and her husband about this.  Again, she's had a very good response. I don't think we need to do any CT scans on her. Her last scans showed resolution of her lymphadenopathy and splenomegaly.  Her faith  has been very strong. This really has gotten her through all the hard times.  I will see her back in 2 months at which point we shall start her on maintenance Gazyva.  I spent about 25-30 minutes with she and her husband.   Volanda Napoleon, MD 4/27/201610:31 AM

## 2015-01-26 NOTE — Patient Instructions (Signed)
[facility] Discharge Instructions for Patients Receiving Chemotherapy  Today you received the following chemotherapy agents Treanda and Gazyva.  To help prevent nausea and vomiting after your treatment, we encourage you to take your nausea medication.   If you develop nausea and vomiting that is not controlled by your nausea medication, call the clinic.   BELOW ARE SYMPTOMS THAT SHOULD BE REPORTED IMMEDIATELY:  *FEVER GREATER THAN 100.5 F  *CHILLS WITH OR WITHOUT FEVER  NAUSEA AND VOMITING THAT IS NOT CONTROLLED WITH YOUR NAUSEA MEDICATION  *UNUSUAL SHORTNESS OF BREATH  *UNUSUAL BRUISING OR BLEEDING  TENDERNESS IN MOUTH AND THROAT WITH OR WITHOUT PRESENCE OF ULCERS  *URINARY PROBLEMS  *BOWEL PROBLEMS  UNUSUAL RASH Items with * indicate a potential emergency and should be followed up as soon as possible.  Feel free to call the clinic you have any questions or concerns. The clinic phone number is [phone].  Please show the [facility] at check-in to the Emergency Department and triage nurse.

## 2015-01-26 NOTE — Progress Notes (Signed)
Ok to treat per Dr. Marin Olp despite Vevay counts.

## 2015-01-27 ENCOUNTER — Ambulatory Visit (HOSPITAL_BASED_OUTPATIENT_CLINIC_OR_DEPARTMENT_OTHER): Payer: Medicare Other

## 2015-01-27 VITALS — BP 199/74 | HR 73 | Temp 98.4°F | Resp 20

## 2015-01-27 DIAGNOSIS — C9110 Chronic lymphocytic leukemia of B-cell type not having achieved remission: Secondary | ICD-10-CM | POA: Diagnosis not present

## 2015-01-27 DIAGNOSIS — Z5112 Encounter for antineoplastic immunotherapy: Secondary | ICD-10-CM

## 2015-01-27 MED ORDER — SODIUM CHLORIDE 0.9 % IV SOLN
10.0000 mg | Freq: Once | INTRAVENOUS | Status: AC
  Administered 2015-01-27: 10 mg via INTRAVENOUS
  Filled 2015-01-27: qty 1

## 2015-01-27 MED ORDER — SODIUM CHLORIDE 0.9 % IV SOLN
91.0000 mg/m2 | Freq: Once | INTRAVENOUS | Status: AC
  Administered 2015-01-27: 200 mg via INTRAVENOUS
  Filled 2015-01-27: qty 8

## 2015-01-27 MED ORDER — PALONOSETRON HCL INJECTION 0.25 MG/5ML
INTRAVENOUS | Status: AC
  Filled 2015-01-27: qty 5

## 2015-01-27 MED ORDER — HEPARIN SOD (PORK) LOCK FLUSH 100 UNIT/ML IV SOLN
500.0000 [IU] | Freq: Once | INTRAVENOUS | Status: AC | PRN
  Administered 2015-01-27: 500 [IU]
  Filled 2015-01-27: qty 5

## 2015-01-27 MED ORDER — SODIUM CHLORIDE 0.9 % IJ SOLN
10.0000 mL | INTRAMUSCULAR | Status: DC | PRN
  Administered 2015-01-27: 10 mL
  Filled 2015-01-27: qty 10

## 2015-01-27 MED ORDER — PALONOSETRON HCL INJECTION 0.25 MG/5ML
0.2500 mg | Freq: Once | INTRAVENOUS | Status: AC
  Administered 2015-01-27: 0.25 mg via INTRAVENOUS

## 2015-01-27 MED ORDER — SODIUM CHLORIDE 0.9 % IV SOLN
Freq: Once | INTRAVENOUS | Status: AC
  Administered 2015-01-27: 10:00:00 via INTRAVENOUS

## 2015-01-27 NOTE — Patient Instructions (Signed)
Bendamustine Injection What is this medicine? BENDAMUSTINE (BEN da MUS teen) is a chemotherapy drug. It is used to treat chronic lymphocytic leukemia and non-Hodgkin lymphoma. This medicine may be used for other purposes; ask your health care provider or pharmacist if you have questions. COMMON BRAND NAME(S): Treanda What should I tell my health care provider before I take this medicine? They need to know if you have any of these conditions: -kidney disease -liver disease -an unusual or allergic reaction to bendamustine, mannitol, other medicines, foods, dyes, or preservatives -pregnant or trying to get pregnant -breast-feeding How should I use this medicine? This medicine is for infusion into a vein. It is given by a health care professional in a hospital or clinic setting. Talk to your pediatrician regarding the use of this medicine in children. Special care may be needed. Overdosage: If you think you have taken too much of this medicine contact a poison control center or emergency room at once. NOTE: This medicine is only for you. Do not share this medicine with others. What if I miss a dose? It is important not to miss your dose. Call your doctor or health care professional if you are unable to keep an appointment. What may interact with this medicine? Do not take this medicine with any of the following medications: -clozapine This medicine may also interact with the following medications: -atazanavir -cimetidine -ciprofloxacin -enoxacin -fluvoxamine -medicines for seizures like carbamazepine and phenobarbital -mexiletine -rifampin -tacrine -thiabendazole -zileuton This list may not describe all possible interactions. Give your health care provider a list of all the medicines, herbs, non-prescription drugs, or dietary supplements you use. Also tell them if you smoke, drink alcohol, or use illegal drugs. Some items may interact with your medicine. What should I watch for while  using this medicine? Your condition will be monitored carefully while you are receiving this medicine. This drug may make you feel generally unwell. This is not uncommon, as chemotherapy can affect healthy cells as well as cancer cells. Report any side effects. Continue your course of treatment even though you feel ill unless your doctor tells you to stop. Call your doctor or health care professional for advice if you get a fever, chills or sore throat, or other symptoms of a cold or flu. Do not treat yourself. This drug decreases your body's ability to fight infections. Try to avoid being around people who are sick. This medicine may increase your risk to bruise or bleed. Call your doctor or health care professional if you notice any unusual bleeding. Be careful brushing and flossing your teeth or using a toothpick because you may get an infection or bleed more easily. If you have any dental work done, tell your dentist you are receiving this medicine. Avoid taking products that contain aspirin, acetaminophen, ibuprofen, naproxen, or ketoprofen unless instructed by your doctor. These medicines may hide a fever. Do not become pregnant while taking this medicine. Women should inform their doctor if they wish to become pregnant or think they might be pregnant. There is a potential for serious side effects to an unborn child. Men should inform their doctors if they wish to father a child. This medicine may lower sperm counts. Talk to your health care professional or pharmacist for more information. Do not breast-feed an infant while taking this medicine. What side effects may I notice from receiving this medicine? Side effects that you should report to your doctor or health care professional as soon as possible: -allergic reactions like skin rash,   itching or hives, swelling of the face, lips, or tongue -low blood counts - this medicine may decrease the number of white blood cells, red blood cells and  platelets. You may be at increased risk for infections and bleeding. -signs of infection - fever or chills, cough, sore throat, pain or difficulty passing urine -signs of decreased platelets or bleeding - bruising, pinpoint red spots on the skin, black, tarry stools, blood in the urine -signs of decreased red blood cells - unusually weak or tired, fainting spells, lightheadedness -trouble passing urine or change in the amount of urine Side effects that usually do not require medical attention (report to your doctor or health care professional if they continue or are bothersome): -diarrhea This list may not describe all possible side effects. Call your doctor for medical advice about side effects. You may report side effects to FDA at 1-800-FDA-1088. Where should I keep my medicine? This drug is given in a hospital or clinic and will not be stored at home. NOTE: This sheet is a summary. It may not cover all possible information. If you have questions about this medicine, talk to your doctor, pharmacist, or health care provider.  2015, [employer]. (2011-12-14 14:15:47)  

## 2015-02-18 ENCOUNTER — Other Ambulatory Visit: Payer: Self-pay | Admitting: *Deleted

## 2015-02-18 DIAGNOSIS — C9110 Chronic lymphocytic leukemia of B-cell type not having achieved remission: Secondary | ICD-10-CM

## 2015-02-18 MED ORDER — ORPHENADRINE CITRATE ER 100 MG PO TB12: 100.0000 mg | ORAL_TABLET | Freq: Two times a day (BID) | ORAL | Status: DC | PRN

## 2015-03-16 ENCOUNTER — Other Ambulatory Visit: Payer: Self-pay | Admitting: Radiology

## 2015-03-17 ENCOUNTER — Ambulatory Visit (HOSPITAL_COMMUNITY)
Admission: RE | Admit: 2015-03-17 | Discharge: 2015-03-17 | Disposition: A | Discharge status: Home / Self Care | Payer: Medicare Other | Source: Ambulatory Visit | Attending: Hematology & Oncology | Admitting: Hematology & Oncology

## 2015-03-17 ENCOUNTER — Encounter (HOSPITAL_COMMUNITY): Payer: Self-pay

## 2015-03-17 DIAGNOSIS — Z794 Long term (current) use of insulin: Secondary | ICD-10-CM | POA: Diagnosis not present

## 2015-03-17 DIAGNOSIS — E119 Type 2 diabetes mellitus without complications: Secondary | ICD-10-CM | POA: Diagnosis not present

## 2015-03-17 DIAGNOSIS — C9110 Chronic lymphocytic leukemia of B-cell type not having achieved remission: Secondary | ICD-10-CM | POA: Diagnosis present

## 2015-03-17 LAB — CBC
HEMATOCRIT: 34.4 % — AB (ref 36.0–46.0)
Hemoglobin: 10.9 g/dL — ABNORMAL LOW (ref 12.0–15.0)
MCH: 23.6 pg — ABNORMAL LOW (ref 26.0–34.0)
MCHC: 31.7 g/dL (ref 30.0–36.0)
MCV: 74.6 fL — ABNORMAL LOW (ref 78.0–100.0)
Platelets: 136 10*3/uL — ABNORMAL LOW (ref 150–400)
RBC: 4.61 MIL/uL (ref 3.87–5.11)
RDW: 14.5 % (ref 11.5–15.5)
WBC: 3.2 10*3/uL — AB (ref 4.0–10.5)

## 2015-03-17 LAB — APTT: APTT: 24 s (ref 24–37)

## 2015-03-17 LAB — GLUCOSE, CAPILLARY: GLUCOSE-CAPILLARY: 270 mg/dL — AB (ref 65–99)

## 2015-03-17 LAB — PROTIME-INR
INR: 1.1 (ref 0.00–1.49)
Prothrombin Time: 14.4 seconds (ref 11.6–15.2)

## 2015-03-17 LAB — BONE MARROW EXAM

## 2015-03-17 MED ORDER — MIDAZOLAM HCL 2 MG/2ML IJ SOLN
INTRAMUSCULAR | Status: AC | PRN
  Administered 2015-03-17: 1 mg via INTRAVENOUS
  Administered 2015-03-17 (×2): 0.5 mg via INTRAVENOUS

## 2015-03-17 MED ORDER — FENTANYL CITRATE (PF) 100 MCG/2ML IJ SOLN
INTRAMUSCULAR | Status: AC | PRN
  Administered 2015-03-17: 25 ug via INTRAVENOUS
  Administered 2015-03-17: 50 ug via INTRAVENOUS
  Administered 2015-03-17: 25 ug via INTRAVENOUS

## 2015-03-17 MED ORDER — SODIUM CHLORIDE 0.9 % IV SOLN
INTRAVENOUS | Status: DC
  Administered 2015-03-17: 10:00:00 via INTRAVENOUS

## 2015-03-17 MED ORDER — MIDAZOLAM HCL 2 MG/2ML IJ SOLN
INTRAMUSCULAR | Status: AC
  Filled 2015-03-17: qty 4

## 2015-03-17 MED ORDER — HYDRALAZINE HCL 20 MG/ML IJ SOLN
10.0000 mg | Freq: Once | INTRAMUSCULAR | Status: AC
  Administered 2015-03-17: 10 mg via INTRAVENOUS
  Filled 2015-03-17: qty 0.5

## 2015-03-17 MED ORDER — FENTANYL CITRATE (PF) 100 MCG/2ML IJ SOLN
INTRAMUSCULAR | Status: AC
  Filled 2015-03-17: qty 4

## 2015-03-17 NOTE — Progress Notes (Signed)
Hydralazine IV received from pharmacy at this time - had been sent to wrong department tube station.  Administered at this time.  Will monitor.  Coolidge Breeze, RN 03/17/2015

## 2015-03-17 NOTE — Procedures (Signed)
CT guided bone marrow biopsy.   2 aspirates and 1 core biopsy.  No immediate complication and minimal blood loss.

## 2015-03-17 NOTE — H&P (Signed)
Referring Physician(s): Ennever,Peter R  History of Present Illness: Megan Tate is a 69 y.o. female with CLL she has finished treatment and has been seen by Dr. Marin Olp and scheduled today for image guided bone marrow biopsy. She denies any chest pain, shortness of breath or palpitations. She denies any active signs of bleeding or excessive bruising. She denies any recent fever or chills. The patient denies any history of sleep apnea or chronic oxygen use. She has previously tolerated sedation without complications.   Past Medical History  Diagnosis Date  . Diabetes mellitus without complication   . Cancer CLL  . Hypertension   . CLL (chronic lymphocytic leukemia) 08/16/2014    History reviewed. No pertinent past surgical history.  Allergies: Tylenol  Medications: Prior to Admission medications   Medication Sig Start Date End Date Taking? Authorizing Provider  Azilsartan Medoxomil (EDARBI) 40 MG TABS Take 1 tablet by mouth daily.   Yes Historical Provider, MD  cloNIDine (CATAPRES) 0.2 MG tablet Take 0.2 mg by mouth 2 (two) times daily.  02/04/14  Yes Historical Provider, MD  dorzolamide-timolol (COSOPT) 22.3-6.8 MG/ML ophthalmic solution Place 1 drop into both eyes 2 (two) times daily.  03/18/14  Yes Historical Provider, MD  esomeprazole (NEXIUM) 20 MG capsule Take 1 capsule (20 mg total) by mouth 2 (two) times daily before a meal. 01/14/15  Yes Volanda Napoleon, MD  KOMBIGLYZE XR 2.01-999 MG TB24 Take by mouth every morning.  02/16/14  Yes Historical Provider, MD  LEVEMIR FLEXTOUCH 100 UNIT/ML Pen Inject 10 Units into the skin every morning.  03/10/14  Yes Historical Provider, MD  lidocaine-prilocaine (EMLA) cream Apply to Port-A-Cath site 1 hour ride to treatment 08/16/14  Yes Volanda Napoleon, MD  metoprolol succinate (TOPROL-XL) 100 MG 24 hr tablet Take 100 mg by mouth daily with lunch.  03/10/14  Yes Historical Provider, MD  orphenadrine (NORFLEX) 100 MG tablet Take 1 tablet (100 mg  total) by mouth 2 (two) times daily as needed for muscle spasms. 02/18/15  Yes Volanda Napoleon, MD  rosuvastatin (CRESTOR) 20 MG tablet Take 20 mg by mouth daily.   Yes Historical Provider, MD  spironolactone (ALDACTONE) 25 MG tablet Take 1 tablet (25 mg total) by mouth once. Patient taking differently: Take 25 mg by mouth daily.  04/22/14  Yes Volanda Napoleon, MD  bisacodyl (DULCOLAX) 5 MG EC tablet Take 1 tablet (5 mg total) by mouth 2 (two) times daily as needed for moderate constipation. Patient not taking: Reported on 03/15/2015 10/11/14   Volanda Napoleon, MD  Blood Glucose Monitoring Suppl (ONE TOUCH ULTRA SYSTEM KIT) W/DEVICE KIT 1 kit by Does not apply route once. Check blood sugar daily before meals and at bedtime. 11/19/14   Volanda Napoleon, MD  fluconazole (DIFLUCAN) 200 MG tablet Take 1 tablet (200 mg total) by mouth daily. Patient not taking: Reported on 03/15/2015 01/14/15   Volanda Napoleon, MD  glucose blood test strip Monitor blood glucose daily before every meal and at bedtime 11/19/14   Volanda Napoleon, MD  levofloxacin (LEVAQUIN) 500 MG tablet Take 1 tablet (500 mg total) by mouth daily. Patient not taking: Reported on 03/15/2015 11/08/14   Volanda Napoleon, MD  metoCLOPramide (REGLAN) 10 MG tablet Take 1 tablet (10 mg total) by mouth 3 (three) times daily before meals. Patient not taking: Reported on 03/15/2015 12/06/14   Volanda Napoleon, MD  ondansetron (ZOFRAN) 8 MG tablet Take 1 tablet (8 mg total) by mouth  2 (two) times daily. Start the day after chemo for 2 days. Then take as needed for nausea or vomiting. Patient not taking: Reported on 03/15/2015 09/01/14   Volanda Napoleon, MD  oxyCODONE (OXY IR/ROXICODONE) 5 MG immediate release tablet Take 1-2 tablets (5-10 mg total) by mouth every 6 (six) hours as needed for severe pain. Patient not taking: Reported on 03/15/2015 10/20/14   Eliezer Bottom, NP  prochlorperazine (COMPAZINE) 10 MG tablet Take 1 tablet (10 mg total) by mouth every 6  (six) hours as needed (Nausea or vomiting). Patient not taking: Reported on 03/15/2015 10/11/14   Volanda Napoleon, MD     History reviewed. No pertinent family history.  History   Social History  . Marital Status: Single    Spouse Name: N/A  . Number of Children: N/A  . Years of Education: N/A   Social History Main Topics  . Smoking status: Never Smoker   . Smokeless tobacco: Never Used     Comment: never used tobacco  . Alcohol Use: Not on file  . Drug Use: Not on file  . Sexual Activity: Not on file   Other Topics Concern  . None   Social History Narrative   Review of Systems: A 12 point ROS discussed and pertinent positives are indicated in the HPI above.  All other systems are negative.  Review of Systems  Vital Signs: BP 225/105 mmHg  Pulse 75  Temp(Src) 99 F (37.2 C) (Oral)  Resp 18  SpO2 100%  Physical Exam  Constitutional: She is oriented to person, place, and time. No distress.  HENT:  Head: Normocephalic and atraumatic.  Neck: No tracheal deviation present.  Cardiovascular: Normal rate and regular rhythm.  Exam reveals no gallop and no friction rub.   No murmur heard. Pulmonary/Chest: Effort normal and breath sounds normal. No respiratory distress. She has no wheezes. She has no rales.  Neurological: She is alert and oriented to person, place, and time.  Skin: She is not diaphoretic.  Psychiatric: She has a normal mood and affect. Her behavior is normal. Thought content normal.   Mallampati Score:  MD Evaluation Airway: WNL Heart: WNL Abdomen: WNL Chest/ Lungs: WNL ASA  Classification: 2 Mallampati/Airway Score: Two  Imaging: No results found.  Labs:  CBC:  Recent Labs  11/16/14 1209 12/01/14 0836 12/29/14 0812 01/26/15 0910  WBC 1.4* 2.4* 4.1 2.8*  HGB 10.9* 12.3 10.8* 11.0*  HCT 32.8* 37.1 32.8* 32.6*  PLT 252 154 Platelet count confirmed by slide estimate 139* 185    COAGS:  Recent Labs  08/30/14 1013  INR 1.09     BMP:  Recent Labs  11/16/14 1209 12/01/14 0836 12/29/14 0812 01/26/15 0911  NA 136 142 135 138  K 3.2* 3.8 3.7 3.6  CL 101 98 104 99  CO2 '28 28 30 30  ' GLUCOSE 195* 196* 258* 317*  BUN '7 8 9 8  ' CALCIUM 9.7 11.2* 10.5* 11.0*  CREATININE 0.75 1.0 0.8 0.9    LIVER FUNCTION TESTS:  Recent Labs  04/07/14 1437 06/16/14 1102  11/16/14 1209 12/01/14 0836 12/29/14 0812 01/26/15 0911  BILITOT 0.3 0.4  < > 0.4 0.70 0.60 0.70  AST 16 15  < > '11 24 15 18  ' ALT 9 14  < > <'8 18 10 17  ' ALKPHOS 69 78  < > 52 58 77 76  PROT 6.6 6.3  < > 5.4* 6.3* 5.9* 6.4  ALBUMIN 4.3 4.0  --  3.4*  --   --   --   < > =  values in this interval not displayed.  Assessment and Plan: CLL, finished treatment  Seen by Dr. Marin Olp Scheduled today for image guided bone marrow biopsy with sedation The patient has been NPO, no blood thinners taken, labs and vitals have been reviewed. Risks and Benefits discussed with the patient including, but not limited to bleeding, infection, damage to adjacent structures or low yield requiring additional tests. All of the patient's questions were answered, patient is agreeable to proceed. Consent signed and in chart. HTN, will give IV Hydralazine and recheck BP   Thank you for this interesting consult.  I greatly enjoyed meeting Megan Tate and look forward to participating in their care.  SignedHedy Jacob 03/17/2015, 9:38 AM   I spent a total of 30 minutes in face to face in clinical consultation, greater than 50% of which was counseling/coordinating care for CLL.

## 2015-03-17 NOTE — Discharge Instructions (Signed)
Bone Marrow Aspiration, Bone Marrow Biopsy °Care After °Read the instructions outlined below and refer to this sheet in the next few weeks. These discharge instructions provide you with general information on caring for yourself after you leave the hospital. Your caregiver may also give you specific instructions. While your treatment has been planned according to the most current medical practices available, unavoidable complications occasionally occur. If you have any problems or questions after discharge, call your caregiver. °FINDING OUT THE RESULTS OF YOUR TEST °Not all test results are available during your visit. If your test results are not back during the visit, make an appointment with your caregiver to find out the results. Do not assume everything is normal if you have not heard from your caregiver or the medical facility. It is important for you to follow up on all of your test results.  °HOME CARE INSTRUCTIONS  °You have had sedation and may be sleepy or dizzy. Your thinking may not be as clear as usual. For the next 24 hours: °· Only take over-the-counter or prescription medicines for pain, discomfort, and or fever as directed by your caregiver. °· Do not drink alcohol. °· Do not smoke. °· Do not drive. °· Do not make important legal decisions. °· Do not operate heavy machinery. °· Do not care for small children by yourself. °· Keep your dressing clean and dry. You may replace dressing with a bandage after 24 hours. °· You may take a bath or shower after 24 hours. °· Use an ice pack for 20 minutes every 2 hours while awake for pain as needed. °SEEK MEDICAL CARE IF:  °· There is redness, swelling, or increasing pain at the biopsy site. °· There is pus coming from the biopsy site. °· There is drainage from a biopsy site lasting longer than one day. °· An unexplained oral temperature above 102° F (38.9° C) develops. °SEEK IMMEDIATE MEDICAL CARE IF:  °· You develop a rash. °· You have difficulty  breathing. °· You develop any reaction or side effects to medications given. °Document Released: 04/06/2005 Document Revised: 12/10/2011 Document Reviewed: 09/14/2008 °ExitCare® Patient Information ©2015 [employer]. This information is not intended to replace advice given to you by your health care provider. Make sure you discuss any questions you have with your health care provider. °Conscious Sedation °Sedation is the use of medicines to promote relaxation and relieve discomfort and anxiety. Conscious sedation is a type of sedation. Under conscious sedation you are less alert than normal but are still able to respond to instructions or stimulation. Conscious sedation is used during short medical and dental procedures. It is milder than deep sedation or general anesthesia and allows you to return to your regular activities sooner.  °LET YOUR HEALTH CARE PROVIDER KNOW ABOUT:  °· Any allergies you have. °· All medicines you are taking, including vitamins, herbs, eye drops, creams, and over-the-counter medicines. °· Use of steroids (by mouth or creams). °· Previous problems you or members of your family have had with the use of anesthetics. °· Any blood disorders you have. °· Previous surgeries you have had. °· Medical conditions you have. °· Possibility of pregnancy, if this applies. °· Use of cigarettes, alcohol, or illegal drugs. °RISKS AND COMPLICATIONS °Generally, this is a safe procedure. However, as with any procedure, problems can occur. Possible problems include: °· Oversedation. °· Trouble breathing on your own. You may need to have a breathing tube until you are awake and breathing on your own. °· Allergic reaction   to any of the medicines used for the procedure. °BEFORE THE PROCEDURE °· You may have blood tests done. These tests can help show how well your kidneys and liver are working. They can also show how well your blood clots. °· A physical exam will be done.   °· Only take medicines as directed by  your health care provider. You may need to stop taking medicines (such as blood thinners, aspirin, or nonsteroidal anti-inflammatory drugs) before the procedure.   °· Do not eat or drink at least 6 hours before the procedure or as directed by your health care provider. °· Arrange for a responsible adult, family member, or friend to take you home after the procedure. He or she should stay with you for at least 24 hours after the procedure, until the medicine has worn off. °PROCEDURE  °· An intravenous (IV) catheter will be inserted into one of your veins. Medicine will be able to flow directly into your body through this catheter. You may be given medicine through this tube to help prevent pain and help you relax. °· The medical or dental procedure will be done. °AFTER THE PROCEDURE °· You will stay in a recovery area until the medicine has worn off. Your blood pressure and pulse will be checked.   °·  Depending on the procedure you had, you may be allowed to go home when you can tolerate liquids and your pain is under control. °Document Released: 06/12/2001 Document Revised: 09/22/2013 Document Reviewed: 05/25/2013 °ExitCare® Patient Information ©2015 [employer]. This information is not intended to replace advice given to you by your health care provider. Make sure you discuss any questions you have with your health care provider. ° °

## 2015-03-21 ENCOUNTER — Telehealth: Payer: Self-pay | Admitting: *Deleted

## 2015-03-21 NOTE — Telephone Encounter (Addendum)
Patient is aware of results.   ----- Message from Volanda Napoleon, MD sent at 03/21/2015  3:11 PM EDT ----- Call - no obvious leukemia in the bone marrow!!!  God is good!!  Film/video editor

## 2015-03-29 ENCOUNTER — Encounter: Payer: Self-pay | Admitting: Hematology & Oncology

## 2015-03-29 LAB — TISSUE HYBRIDIZATION (BONE MARROW)-NCBH

## 2015-03-29 LAB — CHROMOSOME ANALYSIS, BONE MARROW

## 2015-03-30 ENCOUNTER — Ambulatory Visit (HOSPITAL_BASED_OUTPATIENT_CLINIC_OR_DEPARTMENT_OTHER): Payer: Medicare Other | Admitting: Hematology & Oncology

## 2015-03-30 ENCOUNTER — Other Ambulatory Visit (HOSPITAL_BASED_OUTPATIENT_CLINIC_OR_DEPARTMENT_OTHER): Payer: Medicare Other

## 2015-03-30 ENCOUNTER — Ambulatory Visit (HOSPITAL_BASED_OUTPATIENT_CLINIC_OR_DEPARTMENT_OTHER): Payer: Medicare Other

## 2015-03-30 VITALS — BP 216/89 | HR 61 | Temp 98.0°F | Resp 20

## 2015-03-30 VITALS — BP 186/69 | HR 71 | Temp 98.0°F | Resp 16

## 2015-03-30 DIAGNOSIS — C9110 Chronic lymphocytic leukemia of B-cell type not having achieved remission: Secondary | ICD-10-CM

## 2015-03-30 DIAGNOSIS — Z5112 Encounter for antineoplastic immunotherapy: Secondary | ICD-10-CM

## 2015-03-30 LAB — CBC WITH DIFFERENTIAL (CANCER CENTER ONLY)
BASO#: 0.1 10*3/uL (ref 0.0–0.2)
BASO%: 3 % — ABNORMAL HIGH (ref 0.0–2.0)
EOS ABS: 0 10*3/uL (ref 0.0–0.5)
EOS%: 1.1 % (ref 0.0–7.0)
HCT: 30.2 % — ABNORMAL LOW (ref 34.8–46.6)
HGB: 10.3 g/dL — ABNORMAL LOW (ref 11.6–15.9)
LYMPH#: 1.5 10*3/uL (ref 0.9–3.3)
LYMPH%: 41.6 % (ref 14.0–48.0)
MCH: 25.4 pg — ABNORMAL LOW (ref 26.0–34.0)
MCHC: 34.1 g/dL (ref 32.0–36.0)
MCV: 75 fL — ABNORMAL LOW (ref 81–101)
MONO#: 0.8 10*3/uL (ref 0.1–0.9)
MONO%: 21.6 % — AB (ref 0.0–13.0)
NEUT%: 32.7 % — ABNORMAL LOW (ref 39.6–80.0)
NEUTROS ABS: 1.2 10*3/uL — AB (ref 1.5–6.5)
PLATELETS: 188 10*3/uL (ref 145–400)
RBC: 4.05 10*6/uL (ref 3.70–5.32)
RDW: 13.8 % (ref 11.1–15.7)
WBC: 3.7 10*3/uL — ABNORMAL LOW (ref 3.9–10.0)

## 2015-03-30 LAB — CMP (CANCER CENTER ONLY)
ALT(SGPT): 14 U/L (ref 10–47)
AST: 18 U/L (ref 11–38)
Albumin: 3.7 g/dL (ref 3.3–5.5)
Alkaline Phosphatase: 75 U/L (ref 26–84)
BILIRUBIN TOTAL: 0.7 mg/dL (ref 0.20–1.60)
BUN: 14 mg/dL (ref 7–22)
CHLORIDE: 102 meq/L (ref 98–108)
CO2: 28 mEq/L (ref 18–33)
CREATININE: 1.1 mg/dL (ref 0.6–1.2)
Calcium: 10.9 mg/dL — ABNORMAL HIGH (ref 8.0–10.3)
Glucose, Bld: 215 mg/dL — ABNORMAL HIGH (ref 73–118)
Potassium: 3.8 mEq/L (ref 3.3–4.7)
Sodium: 136 mEq/L (ref 128–145)
TOTAL PROTEIN: 5.8 g/dL — AB (ref 6.4–8.1)

## 2015-03-30 MED ORDER — IBUPROFEN 200 MG PO TABS
400.0000 mg | ORAL_TABLET | Freq: Once | ORAL | Status: AC
  Administered 2015-03-30: 400 mg via ORAL

## 2015-03-30 MED ORDER — HEPARIN SOD (PORK) LOCK FLUSH 100 UNIT/ML IV SOLN
250.0000 [IU] | Freq: Once | INTRAVENOUS | Status: DC | PRN
  Filled 2015-03-30: qty 5

## 2015-03-30 MED ORDER — DIPHENHYDRAMINE HCL 50 MG/ML IJ SOLN
INTRAMUSCULAR | Status: AC
  Filled 2015-03-30: qty 1

## 2015-03-30 MED ORDER — SODIUM CHLORIDE 0.9 % IV SOLN
1000.0000 mg | Freq: Once | INTRAVENOUS | Status: AC
  Administered 2015-03-30: 1000 mg via INTRAVENOUS
  Filled 2015-03-30: qty 40

## 2015-03-30 MED ORDER — SODIUM CHLORIDE 0.9 % IV SOLN: Freq: Once | INTRAVENOUS | Status: DC

## 2015-03-30 MED ORDER — SODIUM CHLORIDE 0.9 % IJ SOLN
10.0000 mL | INTRAMUSCULAR | Status: DC | PRN
  Administered 2015-03-30: 10 mL
  Filled 2015-03-30: qty 10

## 2015-03-30 MED ORDER — ALTEPLASE 2 MG IJ SOLR
2.0000 mg | Freq: Once | INTRAMUSCULAR | Status: DC | PRN
  Filled 2015-03-30: qty 2

## 2015-03-30 MED ORDER — DIPHENHYDRAMINE HCL 50 MG/ML IJ SOLN
50.0000 mg | Freq: Once | INTRAMUSCULAR | Status: AC
  Administered 2015-03-30: 50 mg via INTRAVENOUS

## 2015-03-30 MED ORDER — IBUPROFEN 200 MG PO TABS
ORAL_TABLET | ORAL | Status: AC
  Filled 2015-03-30: qty 2

## 2015-03-30 MED ORDER — FAMOTIDINE IN NACL 20-0.9 MG/50ML-% IV SOLN
INTRAVENOUS | Status: AC
  Filled 2015-03-30: qty 50

## 2015-03-30 MED ORDER — FAMOTIDINE IN NACL 20-0.9 MG/50ML-% IV SOLN
40.0000 mg | Freq: Once | INTRAVENOUS | Status: AC
  Administered 2015-03-30: 40 mg via INTRAVENOUS

## 2015-03-30 MED ORDER — SODIUM CHLORIDE 0.9 % IJ SOLN
3.0000 mL | INTRAMUSCULAR | Status: DC | PRN
  Filled 2015-03-30: qty 10

## 2015-03-30 MED ORDER — HEPARIN SOD (PORK) LOCK FLUSH 100 UNIT/ML IV SOLN
500.0000 [IU] | Freq: Once | INTRAVENOUS | Status: AC | PRN
  Administered 2015-03-30: 500 [IU]
  Filled 2015-03-30: qty 5

## 2015-03-30 NOTE — Patient Instructions (Signed)
Obinutuzumab injection What is this medicine? OBINUTUZUMAB (OH bi nue TOOZ ue mab) is a monoclonal antibody. It is used to treat chronic lymphocytic leukemia. This drug targets a specific protein on cancer cells and stops them from growing. This medicine may be used for other purposes; ask your health care provider or pharmacist if you have questions. COMMON BRAND NAME(S): GAZYVA What should I tell my health care provider before I take this medicine? They need to know if you have any of these conditions: -hepatitis -low blood counts, like low white cell, platelet, or red cell counts -lung or breathing disease -heart disease -an unusual or allergic reaction to ofatumumab, other medicines, foods, dyes, or preservatives -pregnant or trying to get pregnant -breast-feeding How should I use this medicine? This medicine is for infusion into a vein. It is given by a health care professional in a hospital or clinic setting. Talk to your pediatrician regarding the use of this medicine in children. Special care may be needed. Overdosage: If you think you've taken too much of this medicine contact a poison control center or emergency room at once. Overdosage: If you think you have taken too much of this medicine contact a poison control center or emergency room at once. NOTE: This medicine is only for you. Do not share this medicine with others. What if I miss a dose? Keep appointments for follow-up doses as directed. It is important not to miss your dose. Call your doctor or health care professional if you are unable to keep an appointment. What may interact with this medicine? -live virus vaccines This list may not describe all possible interactions. Give your health care provider a list of all the medicines, herbs, non-prescription drugs, or dietary supplements you use. Also tell them if you smoke, drink alcohol, or use illegal drugs. Some items may interact with your medicine. What should I watch  for while using this medicine? Report any side effects that you notice during your treatment right away, such as changes in your breathing, fever, chills, dizziness or lightheadedness. These effects are more common with the first dose. Visit your prescriber or health care professional for checks on your progress. You will need to have regular blood work. Report any other side effects. The side effects of this medicine can continue after you finish your treatment. Continue your course of treatment even though you feel ill unless your doctor tells you to stop. Call your doctor or health care professional for advice if you get a fever, chills or sore throat, or other symptoms of a cold or flu. Do not treat yourself. This drug decreases your body's ability to fight infections. Try to avoid being around people who are sick. This medicine may increase your risk to bruise or bleed. Call your doctor or health care professional if you notice any unusual bleeding. Be careful brushing and flossing your teeth or using a toothpick because you may get an infection or bleed more easily. If you have any dental work done, tell your dentist you are receiving this medicine. Avoid taking products that contain aspirin, acetaminophen, ibuprofen, naproxen, or ketoprofen unless instructed by your doctor. These medicines may hide a fever. Do not become pregnant while taking this medicine. Women should inform their doctor if they wish to become pregnant or think they might be pregnant. There is a potential for serious side effects to an unborn child. Talk to your health care professional or pharmacist for more information. Do not breast-feed an infant while taking this   medicine. What side effects may I notice from receiving this medicine? Side effects that you should report to your doctor or health care professional as soon as possible: -allergic reactions like skin rash, itching or hives, swelling of the face, lips, or  tongue -breathing problems -changes in vision -chest pain or chest tightness -chills -confusion, trouble speaking or understanding -cough -diarrhea -dizziness -fainting spells -fever -general ill feeling or flu-like symptoms -lightheadedness -loss of balance or coordination -nausea, vomiting -right upper belly pain -trouble walking -unusual bleeding or bruising -unusually weak or tired -yellowing of the eyes or skin Side effects that usually do not require medical attention (Report these to your doctor or health care professional if they continue or are bothersome.): -muscle pain -muscle cramps This list may not describe all possible side effects. Call your doctor for medical advice about side effects. You may report side effects to FDA at 1-800-FDA-1088. Where should I keep my medicine? This drug is only given in a hospital or clinic and will not be stored at home. NOTE: This sheet is a summary. It may not cover all possible information. If you have questions about this medicine, talk to your doctor, pharmacist, or health care provider.  2015, [employer] Standard. (2012-08-07 18:24:29)  

## 2015-03-30 NOTE — Progress Notes (Signed)
Hematology and Oncology Follow Up Visit  Megan Tate [id number] 02/12/1946 69 y.o. 03/30/2015   Principle Diagnosis:   Chronic lymphocytic leukemia- Trisomy 12  Current Therapy:   Status post 6 cycles of Gazyva/Bendamustine Patient to start maintenance Gazyva every 2 months.    Interim History:  Ms.  Megan Tate is back for follow-up. She really looks great. She has complete all of her chemotherapy. We now are going to start her on maintenance therapy.  She did have a follow-up bone marrow biopsy. This was done on June 16. The pathology report ([id number]) shows no active CLL. Her cytogenetics were also normal.  She is getting ready to go to [place] in August. Peripheral blood sugars have been on the high side.  She's had no issues with swollen. Nose. She's had no issues with nausea or vomiting. His been no diarrhea.  She's had no leg swelling. There's been no rashes.   Overall, her performance status is ECOG 1.  Medications:  Current outpatient prescriptions:  .  Azilsartan Medoxomil (EDARBI) 40 MG TABS, Take 1 tablet by mouth daily., Disp: , Rfl:  .  bisacodyl (DULCOLAX) 5 MG EC tablet, Take 1 tablet (5 mg total) by mouth 2 (two) times daily as needed for moderate constipation. (Patient not taking: Reported on 03/15/2015), Disp: 60 tablet, Rfl: 0 .  Blood Glucose Monitoring Suppl (ONE TOUCH ULTRA SYSTEM KIT) W/DEVICE KIT, 1 kit by Does not apply route once. Check blood sugar daily before meals and at bedtime., Disp: 1 each, Rfl: 0 .  cloNIDine (CATAPRES) 0.2 MG tablet, Take 0.2 mg by mouth 2 (two) times daily. , Disp: , Rfl:  .  dorzolamide-timolol (COSOPT) 22.3-6.8 MG/ML ophthalmic solution, Place 1 drop into both eyes 2 (two) times daily. , Disp: , Rfl:  .  esomeprazole (NEXIUM) 20 MG capsule, Take 1 capsule (20 mg total) by mouth 2 (two) times daily before a meal., Disp: 60 capsule, Rfl: 4 .  fluconazole (DIFLUCAN) 200 MG tablet, Take 1 tablet (200 mg total) by mouth daily. (Patient  not taking: Reported on 03/15/2015), Disp: 5 tablet, Rfl: 3 .  glucose blood test strip, Monitor blood glucose daily before every meal and at bedtime, Disp: 100 each, Rfl: 12 .  KOMBIGLYZE XR 2.01-999 MG TB24, Take by mouth every morning. , Disp: , Rfl:  .  LEVEMIR FLEXTOUCH 100 UNIT/ML Pen, Inject 10 Units into the skin every morning. , Disp: , Rfl:  .  levofloxacin (LEVAQUIN) 500 MG tablet, Take 1 tablet (500 mg total) by mouth daily. (Patient not taking: Reported on 03/15/2015), Disp: 10 tablet, Rfl: 0 .  lidocaine-prilocaine (EMLA) cream, Apply to Port-A-Cath site 1 hour ride to treatment, Disp: 30 g, Rfl: 4 .  metoCLOPramide (REGLAN) 10 MG tablet, Take 1 tablet (10 mg total) by mouth 3 (three) times daily before meals. (Patient not taking: Reported on 03/15/2015), Disp: 90 tablet, Rfl: 2 .  metoprolol succinate (TOPROL-XL) 100 MG 24 hr tablet, Take 100 mg by mouth daily with lunch. , Disp: , Rfl:  .  ondansetron (ZOFRAN) 8 MG tablet, Take 1 tablet (8 mg total) by mouth 2 (two) times daily. Start the day after chemo for 2 days. Then take as needed for nausea or vomiting. (Patient not taking: Reported on 03/15/2015), Disp: 30 tablet, Rfl: 1 .  orphenadrine (NORFLEX) 100 MG tablet, Take 1 tablet (100 mg total) by mouth 2 (two) times daily as needed for muscle spasms., Disp: 60 tablet, Rfl: 2 .  oxyCODONE (OXY IR/ROXICODONE)   5 MG immediate release tablet, Take 1-2 tablets (5-10 mg total) by mouth every 6 (six) hours as needed for severe pain. (Patient not taking: Reported on 03/15/2015), Disp: 90 tablet, Rfl: 0 .  prochlorperazine (COMPAZINE) 10 MG tablet, Take 1 tablet (10 mg total) by mouth every 6 (six) hours as needed (Nausea or vomiting). (Patient not taking: Reported on 03/15/2015), Disp: 30 tablet, Rfl: 1 .  rosuvastatin (CRESTOR) 20 MG tablet, Take 20 mg by mouth daily., Disp: , Rfl:  .  spironolactone (ALDACTONE) 25 MG tablet, Take 1 tablet (25 mg total) by mouth once. (Patient taking differently:  Take 25 mg by mouth daily. ), Disp: 90 tablet, Rfl: 3 No current facility-administered medications for this visit.  Facility-Administered Medications Ordered in Other Visits:  .  0.9 %  sodium chloride infusion, , Intravenous, Once, Peter R Ennever, MD .  alteplase (CATHFLO ACTIVASE) injection 2 mg, 2 mg, Intracatheter, Once PRN, Peter R Ennever, MD .  diphenhydrAMINE (BENADRYL) injection 50 mg, 50 mg, Intravenous, Once, Peter R Ennever, MD .  famotidine (PEPCID) IVPB 20 mg premix, 40 mg, Intravenous, Once, Peter R Ennever, MD, 40 mg at 03/30/15 1027 .  heparin lock flush 100 unit/mL, 500 Units, Intracatheter, Once PRN, Peter R Ennever, MD .  heparin lock flush 100 unit/mL, 250 Units, Intracatheter, Once PRN, Peter R Ennever, MD .  obinutuzumab (GAZYVA) 1,000 mg in sodium chloride 0.9 % 250 mL (3.4483 mg/mL) chemo infusion, 1,000 mg, Intravenous, Once, Peter R Ennever, MD .  sodium chloride 0.9 % injection 10 mL, 10 mL, Intracatheter, PRN, Peter R Ennever, MD .  sodium chloride 0.9 % injection 3 mL, 3 mL, Intravenous, PRN, Peter R Ennever, MD  Allergies:  Allergies  Allergen Reactions  . Tylenol [Acetaminophen] Other (See Comments)    Messes with stomach    Past Medical History, Surgical history, Social history, and Family History were reviewed and updated.  Review of Systems: As above  Physical Exam:  oral temperature is 98 F (36.7 C). Her blood pressure is 186/69 and her pulse is 71. Her respiration is 16.   Well-developed and well-nourished African-American female. She is mildly obese. Head and neck exam shows no cervical or subclavicular lymph nodes. There is no mucositis. She has no scleral icterus. Lungs are clear. Cardiac exam regular rate and rhythm with no murmurs, rubs or bruits. Axillary exam shows no axillary adenopathy. Abdomen is soft. She is moderately obese. She has good bowel sounds. There is no palpable liver or spleen tip. Back exam shows no tenderness over the  spine, ribs or hips. Extremities shows no clubbing, cyanosis or edema. She has good range of motion of her joints. She has good strength in her arms and legs. Skin exam no rashes, ecchymoses or petechia. Neurological exam is nonfocal.  Lab Results  Component Value Date   WBC 3.7* 03/30/2015   HGB 10.3* 03/30/2015   HCT 30.2* 03/30/2015   MCV 75* 03/30/2015   PLT 188 03/30/2015     Chemistry      Component Value Date/Time   NA 136 03/30/2015 0851   NA 136 11/16/2014 1209   K 3.8 03/30/2015 0851   K 3.2* 11/16/2014 1209   CL 102 03/30/2015 0851   CL 101 11/16/2014 1209   CO2 28 03/30/2015 0851   CO2 28 11/16/2014 1209   BUN 14 03/30/2015 0851   BUN 7 11/16/2014 1209   CREATININE 1.1 03/30/2015 0851   CREATININE 0.75 11/16/2014 1209        Component Value Date/Time   CALCIUM 10.9* 03/30/2015 0851   CALCIUM 9.7 11/16/2014 1209   ALKPHOS 75 03/30/2015 0851   ALKPHOS 52 11/16/2014 1209   AST 18 03/30/2015 0851   AST 11 11/16/2014 1209   ALT 14 03/30/2015 0851   ALT <8 11/16/2014 1209   BILITOT 0.70 03/30/2015 0851   BILITOT 0.4 11/16/2014 1209         Impression and Plan: Ms. Real is 69 year old African-American female with chronic lymphocytic leukemia. We had to treat her as her white cell count was going up and her lymphadenopathy was worsening.  She's had a fantastic response.   I'm glad that the bone marrow looked so good. Her follow-up CT scans also showed resolution of her lymphadenopathy and splenomegaly.  I think she would be a great candidate for maintenance Gazyva. Again, this is now approved for maintenance therapy for CLL.  I don't think show any issues with this.  We will give this every couple months area did  I'll plan to see her back in 2 months.   Volanda Napoleon, MD 6/29/201610:30 AM

## 2015-04-01 LAB — PROTEIN ELECTROPHORESIS, SERUM, WITH REFLEX
ALBUMIN ELP: 3.4 g/dL — AB (ref 3.8–4.8)
ALPHA-2-GLOBULIN: 0.8 g/dL (ref 0.5–0.9)
Alpha-1-Globulin: 0.3 g/dL (ref 0.2–0.3)
Beta 2: 0.2 g/dL (ref 0.2–0.5)
Beta Globulin: 0.4 g/dL (ref 0.4–0.6)
GAMMA GLOBULIN: 0.5 g/dL — AB (ref 0.8–1.7)
TOTAL PROTEIN, SERUM ELECTROPHOR: 5.5 g/dL — AB (ref 6.1–8.1)

## 2015-04-01 LAB — LACTATE DEHYDROGENASE: LDH: 171 U/L (ref 94–250)

## 2015-04-01 LAB — IGG, IGA, IGM
IGA: 41 mg/dL — AB (ref 69–380)
IgG (Immunoglobin G), Serum: 545 mg/dL — ABNORMAL LOW (ref 690–1700)

## 2015-04-07 ENCOUNTER — Encounter (HOSPITAL_COMMUNITY): Payer: Self-pay

## 2015-05-26 ENCOUNTER — Encounter: Payer: Self-pay | Admitting: Hematology & Oncology

## 2015-05-26 ENCOUNTER — Ambulatory Visit (HOSPITAL_BASED_OUTPATIENT_CLINIC_OR_DEPARTMENT_OTHER): Payer: Medicare Other

## 2015-05-26 ENCOUNTER — Other Ambulatory Visit (HOSPITAL_BASED_OUTPATIENT_CLINIC_OR_DEPARTMENT_OTHER): Payer: Medicare Other

## 2015-05-26 ENCOUNTER — Ambulatory Visit (HOSPITAL_BASED_OUTPATIENT_CLINIC_OR_DEPARTMENT_OTHER): Payer: Medicare Other | Admitting: Hematology & Oncology

## 2015-05-26 VITALS — BP 153/129 | HR 64 | Temp 98.0°F | Resp 16 | Ht 64.0 in | Wt 193.0 lb

## 2015-05-26 VITALS — BP 137/82 | HR 82 | Temp 97.5°F

## 2015-05-26 DIAGNOSIS — Z5112 Encounter for antineoplastic immunotherapy: Secondary | ICD-10-CM

## 2015-05-26 DIAGNOSIS — C9110 Chronic lymphocytic leukemia of B-cell type not having achieved remission: Secondary | ICD-10-CM

## 2015-05-26 LAB — CBC WITH DIFFERENTIAL (CANCER CENTER ONLY)
HCT: 28.9 % — ABNORMAL LOW (ref 34.8–46.6)
HGB: 9.8 g/dL — ABNORMAL LOW (ref 11.6–15.9)
MCH: 24.9 pg — ABNORMAL LOW (ref 26.0–34.0)
MCHC: 33.9 g/dL (ref 32.0–36.0)
MCV: 74 fL — ABNORMAL LOW (ref 81–101)
Platelets: 242 10*3/uL (ref 145–400)
RBC: 3.93 10*6/uL (ref 3.70–5.32)
RDW: 13.7 % (ref 11.1–15.7)
WBC: 3.4 10*3/uL — AB (ref 3.9–10.0)

## 2015-05-26 LAB — MANUAL DIFFERENTIAL (CHCC SATELLITE)
ALC: 1.4 10*3/uL (ref 0.6–2.2)
ANC (CHCC HP manual diff): 1.4 10*3/uL — ABNORMAL LOW (ref 1.5–6.7)
BASO: 2 % (ref 0–2)
EOS: 2 % (ref 0–7)
LYMPH: 41 % (ref 14–48)
MONO: 13 % (ref 0–13)
Myelocytes: 1 % — ABNORMAL HIGH (ref 0–0)
PLT EST ~~LOC~~: ADEQUATE
Platelet Morphology: NORMAL
SEG: 41 % (ref 40–75)

## 2015-05-26 LAB — CMP (CANCER CENTER ONLY)
ALBUMIN: 3.6 g/dL (ref 3.3–5.5)
ALT(SGPT): 12 U/L (ref 10–47)
AST: 15 U/L (ref 11–38)
Alkaline Phosphatase: 62 U/L (ref 26–84)
BILIRUBIN TOTAL: 0.6 mg/dL (ref 0.20–1.60)
BUN, Bld: 11 mg/dL (ref 7–22)
CALCIUM: 10.7 mg/dL — AB (ref 8.0–10.3)
CO2: 26 meq/L (ref 18–33)
Chloride: 101 mEq/L (ref 98–108)
Creat: 1 mg/dl (ref 0.6–1.2)
GLUCOSE: 222 mg/dL — AB (ref 73–118)
POTASSIUM: 3.4 meq/L (ref 3.3–4.7)
Sodium: 136 mEq/L (ref 128–145)
Total Protein: 6.2 g/dL — ABNORMAL LOW (ref 6.4–8.1)

## 2015-05-26 LAB — CHCC SATELLITE - SMEAR

## 2015-05-26 MED ORDER — FAMOTIDINE IN NACL 20-0.9 MG/50ML-% IV SOLN
INTRAVENOUS | Status: AC
  Filled 2015-05-26: qty 100

## 2015-05-26 MED ORDER — DIPHENHYDRAMINE HCL 50 MG/ML IJ SOLN
INTRAMUSCULAR | Status: AC
  Filled 2015-05-26: qty 1

## 2015-05-26 MED ORDER — IBUPROFEN 200 MG PO TABS
ORAL_TABLET | ORAL | Status: AC
  Filled 2015-05-26: qty 2

## 2015-05-26 MED ORDER — IBUPROFEN 200 MG PO TABS
400.0000 mg | ORAL_TABLET | Freq: Once | ORAL | Status: AC
  Administered 2015-05-26: 400 mg via ORAL

## 2015-05-26 MED ORDER — SODIUM CHLORIDE 0.9 % IV SOLN
1000.0000 mg | Freq: Once | INTRAVENOUS | Status: AC
  Administered 2015-05-26: 1000 mg via INTRAVENOUS
  Filled 2015-05-26: qty 40

## 2015-05-26 MED ORDER — ACETAMINOPHEN 325 MG PO TABS
ORAL_TABLET | ORAL | Status: AC
  Filled 2015-05-26: qty 2

## 2015-05-26 MED ORDER — SODIUM CHLORIDE 0.9 % IJ SOLN
10.0000 mL | INTRAMUSCULAR | Status: DC | PRN
  Administered 2015-05-26: 10 mL
  Filled 2015-05-26: qty 10

## 2015-05-26 MED ORDER — SODIUM CHLORIDE 0.9 % IV SOLN
Freq: Once | INTRAVENOUS | Status: AC
  Administered 2015-05-26: 10:00:00 via INTRAVENOUS

## 2015-05-26 MED ORDER — FAMOTIDINE IN NACL 20-0.9 MG/50ML-% IV SOLN
40.0000 mg | Freq: Once | INTRAVENOUS | Status: AC
  Administered 2015-05-26: 40 mg via INTRAVENOUS

## 2015-05-26 MED ORDER — DIPHENHYDRAMINE HCL 50 MG/ML IJ SOLN
50.0000 mg | Freq: Once | INTRAMUSCULAR | Status: AC
  Administered 2015-05-26: 50 mg via INTRAVENOUS

## 2015-05-26 MED ORDER — HEPARIN SOD (PORK) LOCK FLUSH 100 UNIT/ML IV SOLN
500.0000 [IU] | Freq: Once | INTRAVENOUS | Status: AC | PRN
  Administered 2015-05-26: 500 [IU]
  Filled 2015-05-26: qty 5

## 2015-05-26 NOTE — Patient Instructions (Signed)
Obinutuzumab injection What is this medicine? OBINUTUZUMAB (OH bi nue TOOZ ue mab) is a monoclonal antibody. It is used to treat chronic lymphocytic leukemia. This drug targets a specific protein on cancer cells and stops them from growing. This medicine may be used for other purposes; ask your health care provider or pharmacist if you have questions. COMMON BRAND NAME(S): GAZYVA What should I tell my health care provider before I take this medicine? They need to know if you have any of these conditions: -hepatitis -low blood counts, like low white cell, platelet, or red cell counts -lung or breathing disease -heart disease -an unusual or allergic reaction to ofatumumab, other medicines, foods, dyes, or preservatives -pregnant or trying to get pregnant -breast-feeding How should I use this medicine? This medicine is for infusion into a vein. It is given by a health care professional in a hospital or clinic setting. Talk to your pediatrician regarding the use of this medicine in children. Special care may be needed. Overdosage: If you think you've taken too much of this medicine contact a poison control center or emergency room at once. Overdosage: If you think you have taken too much of this medicine contact a poison control center or emergency room at once. NOTE: This medicine is only for you. Do not share this medicine with others. What if I miss a dose? Keep appointments for follow-up doses as directed. It is important not to miss your dose. Call your doctor or health care professional if you are unable to keep an appointment. What may interact with this medicine? -live virus vaccines This list may not describe all possible interactions. Give your health care provider a list of all the medicines, herbs, non-prescription drugs, or dietary supplements you use. Also tell them if you smoke, drink alcohol, or use illegal drugs. Some items may interact with your medicine. What should I watch  for while using this medicine? Report any side effects that you notice during your treatment right away, such as changes in your breathing, fever, chills, dizziness or lightheadedness. These effects are more common with the first dose. Visit your prescriber or health care professional for checks on your progress. You will need to have regular blood work. Report any other side effects. The side effects of this medicine can continue after you finish your treatment. Continue your course of treatment even though you feel ill unless your doctor tells you to stop. Call your doctor or health care professional for advice if you get a fever, chills or sore throat, or other symptoms of a cold or flu. Do not treat yourself. This drug decreases your body's ability to fight infections. Try to avoid being around people who are sick. This medicine may increase your risk to bruise or bleed. Call your doctor or health care professional if you notice any unusual bleeding. Be careful brushing and flossing your teeth or using a toothpick because you may get an infection or bleed more easily. If you have any dental work done, tell your dentist you are receiving this medicine. Avoid taking products that contain aspirin, acetaminophen, ibuprofen, naproxen, or ketoprofen unless instructed by your doctor. These medicines may hide a fever. Do not become pregnant while taking this medicine. Women should inform their doctor if they wish to become pregnant or think they might be pregnant. There is a potential for serious side effects to an unborn child. Talk to your health care professional or pharmacist for more information. Do not breast-feed an infant while taking this   medicine. What side effects may I notice from receiving this medicine? Side effects that you should report to your doctor or health care professional as soon as possible: -allergic reactions like skin rash, itching or hives, swelling of the face, lips, or  tongue -breathing problems -changes in vision -chest pain or chest tightness -chills -confusion, trouble speaking or understanding -cough -diarrhea -dizziness -fainting spells -fever -general ill feeling or flu-like symptoms -lightheadedness -loss of balance or coordination -nausea, vomiting -right upper belly pain -trouble walking -unusual bleeding or bruising -unusually weak or tired -yellowing of the eyes or skin Side effects that usually do not require medical attention (Report these to your doctor or health care professional if they continue or are bothersome.): -muscle pain -muscle cramps This list may not describe all possible side effects. Call your doctor for medical advice about side effects. You may report side effects to FDA at 1-800-FDA-1088. Where should I keep my medicine? This drug is only given in a hospital or clinic and will not be stored at home. NOTE: This sheet is a summary. It may not cover all possible information. If you have questions about this medicine, talk to your doctor, pharmacist, or health care provider.  2015, [employer] Standard. (2012-08-07 18:24:29)  

## 2015-05-26 NOTE — Progress Notes (Signed)
Hematology and Oncology Follow Up Visit  Megan Tate [id number] [birth year] 69 y.o. 05/26/2015   Principle Diagnosis:   Chronic lymphocytic leukemia- Trisomy 12  Current Therapy:   Status post 6 cycles of Gazyva/Bendamustine - completed       12/2014 S/p c#1 of maintenance Gazyva -  every 2 months.    Interim History:  [name] is back for follow-up. She really looks great. She's had no problems since we last saw her. She tolerated her first cycle of maintenance is I will well.  Her blood sugars are doing okay from what she says. Typically, they tend to run in the 200 range.  She and her family will be going to [place] in November. She is looking for to this.  She's had no problems with fever. She's had no rashes. She's had no leg swelling. She's had no bleeding. There's been no change in bowel or bladder habits.  Overall, her performance status is ECOG 1.  Medications:  Current outpatient prescriptions:  .  Azilsartan Medoxomil (EDARBI) 40 MG TABS, Take 1 tablet by mouth daily., Disp: , Rfl:  .  bisacodyl (DULCOLAX) 5 MG EC tablet, Take 1 tablet (5 mg total) by mouth 2 (two) times daily as needed for moderate constipation., Disp: 60 tablet, Rfl: 0 .  Blood Glucose Monitoring Suppl (ONE TOUCH ULTRA SYSTEM KIT) W/DEVICE KIT, 1 kit by Does not apply route once. Check blood sugar daily before meals and at bedtime., Disp: 1 each, Rfl: 0 .  cloNIDine (CATAPRES) 0.2 MG tablet, Take 0.2 mg by mouth 2 (two) times daily. , Disp: , Rfl:  .  dorzolamide-timolol (COSOPT) 22.3-6.8 MG/ML ophthalmic solution, Place 1 drop into both eyes 2 (two) times daily. , Disp: , Rfl:  .  esomeprazole (NEXIUM) 20 MG capsule, Take 1 capsule (20 mg total) by mouth 2 (two) times daily before a meal., Disp: 60 capsule, Rfl: 4 .  fluconazole (DIFLUCAN) 200 MG tablet, Take 1 tablet (200 mg total) by mouth daily., Disp: 5 tablet, Rfl: 3 .  glucose blood test strip, Monitor blood glucose daily before every meal and at  bedtime, Disp: 100 each, Rfl: 12 .  KOMBIGLYZE XR 2.01-999 MG TB24, Take by mouth every morning. , Disp: , Rfl:  .  LEVEMIR FLEXTOUCH 100 UNIT/ML Pen, Inject 10 Units into the skin every morning. , Disp: , Rfl:  .  levofloxacin (LEVAQUIN) 500 MG tablet, Take 1 tablet (500 mg total) by mouth daily., Disp: 10 tablet, Rfl: 0 .  lidocaine-prilocaine (EMLA) cream, Apply to Port-A-Cath site 1 hour ride to treatment, Disp: 30 g, Rfl: 4 .  metoCLOPramide (REGLAN) 10 MG tablet, Take 1 tablet (10 mg total) by mouth 3 (three) times daily before meals., Disp: 90 tablet, Rfl: 2 .  metoprolol succinate (TOPROL-XL) 100 MG 24 hr tablet, Take 100 mg by mouth daily with lunch. , Disp: , Rfl:  .  ondansetron (ZOFRAN) 8 MG tablet, Take 1 tablet (8 mg total) by mouth 2 (two) times daily. Start the day after chemo for 2 days. Then take as needed for nausea or vomiting., Disp: 30 tablet, Rfl: 1 .  orphenadrine (NORFLEX) 100 MG tablet, Take 1 tablet (100 mg total) by mouth 2 (two) times daily as needed for muscle spasms., Disp: 60 tablet, Rfl: 2 .  oxyCODONE (OXY IR/ROXICODONE) 5 MG immediate release tablet, Take 1-2 tablets (5-10 mg total) by mouth every 6 (six) hours as needed for severe pain., Disp: 90 tablet, Rfl: 0 .  prochlorperazine (COMPAZINE) 10 MG tablet, Take 1 tablet (10 mg total) by mouth every 6 (six) hours as needed (Nausea or vomiting)., Disp: 30 tablet, Rfl: 1 .  rosuvastatin (CRESTOR) 20 MG tablet, Take 20 mg by mouth daily., Disp: , Rfl:  .  spironolactone (ALDACTONE) 25 MG tablet, Take 1 tablet (25 mg total) by mouth once. (Patient taking differently: Take 25 mg by mouth daily. ), Disp: 90 tablet, Rfl: 3  Allergies:  Allergies  Allergen Reactions  . Tylenol [Acetaminophen] Other (See Comments)    Messes with stomach    Past Medical History, Surgical history, Social history, and Family History were reviewed and updated.  Review of Systems: As above  Physical Exam:  height is '5\' 4"'  (1.626 m) and  weight is 193 lb (87.544 kg). Her oral temperature is 98 F (36.7 C). Her blood pressure is 153/129 and her pulse is 64. Her respiration is 16.   Well-developed and well-nourished African-American female. She is mildly obese. Head and neck exam shows no cervical or subclavicular lymph nodes. There is no mucositis. She has no scleral icterus. Lungs are clear. Cardiac exam regular rate and rhythm with no murmurs, rubs or bruits. Axillary exam shows no axillary adenopathy. Abdomen is soft. She is moderately obese. She has good bowel sounds. There is no palpable liver or spleen tip. Back exam shows no tenderness over the spine, ribs or hips. Extremities shows no clubbing, cyanosis or edema. She has good range of motion of her joints. She has good strength in her arms and legs. Skin exam no rashes, ecchymoses or petechia. Neurological exam is nonfocal.  Lab Results  Component Value Date   WBC 3.4* 05/26/2015   HGB 9.8* 05/26/2015   HCT 28.9* 05/26/2015   MCV 74* 05/26/2015   PLT 242 05/26/2015     Chemistry      Component Value Date/Time   NA 136 05/26/2015 0834   NA 136 11/16/2014 1209   K 3.4 05/26/2015 0834   K 3.2* 11/16/2014 1209   CL 101 05/26/2015 0834   CL 101 11/16/2014 1209   CO2 26 05/26/2015 0834   CO2 28 11/16/2014 1209   BUN 11 05/26/2015 0834   BUN 7 11/16/2014 1209   CREATININE 1.0 05/26/2015 0834   CREATININE 0.75 11/16/2014 1209      Component Value Date/Time   CALCIUM 10.7* 05/26/2015 0834   CALCIUM 9.7 11/16/2014 1209   ALKPHOS 62 05/26/2015 0834   ALKPHOS 52 11/16/2014 1209   AST 15 05/26/2015 0834   AST 11 11/16/2014 1209   ALT 12 05/26/2015 0834   ALT <8 11/16/2014 1209   BILITOT 0.60 05/26/2015 0834   BILITOT 0.4 11/16/2014 1209         Impression and Plan: Megan Tate is 69 year old African-American female with chronic lymphocytic leukemia. We had to treat her as her white cell count was going up and her lymphadenopathy was worsening.  She's had a  fantastic response.   I'm just glad that she is feeling well.  We will proceed with her second cycle of maintenance Gazyva.  I'll plan to see her back in 2 months.   Volanda Napoleon, MD 8/25/201610:23 AM

## 2015-06-03 ENCOUNTER — Other Ambulatory Visit: Payer: Self-pay | Admitting: Hematology & Oncology

## 2015-06-10 ENCOUNTER — Ambulatory Visit (HOSPITAL_BASED_OUTPATIENT_CLINIC_OR_DEPARTMENT_OTHER): Payer: Medicare Other | Admitting: Family

## 2015-06-10 ENCOUNTER — Encounter: Payer: Self-pay | Admitting: Family

## 2015-06-10 ENCOUNTER — Ambulatory Visit: Payer: Medicare Other

## 2015-06-10 ENCOUNTER — Other Ambulatory Visit (HOSPITAL_BASED_OUTPATIENT_CLINIC_OR_DEPARTMENT_OTHER): Payer: Medicare Other

## 2015-06-10 VITALS — BP 178/91 | HR 84 | Temp 98.4°F | Resp 16 | Ht 64.0 in

## 2015-06-10 DIAGNOSIS — C9110 Chronic lymphocytic leukemia of B-cell type not having achieved remission: Secondary | ICD-10-CM | POA: Diagnosis present

## 2015-06-10 LAB — CMP (CANCER CENTER ONLY)
ALBUMIN: 3.9 g/dL (ref 3.3–5.5)
ALT(SGPT): 14 U/L (ref 10–47)
AST: 19 U/L (ref 11–38)
Alkaline Phosphatase: 63 U/L (ref 26–84)
BILIRUBIN TOTAL: 0.8 mg/dL (ref 0.20–1.60)
BUN, Bld: 9 mg/dL (ref 7–22)
CHLORIDE: 101 meq/L (ref 98–108)
CO2: 27 meq/L (ref 18–33)
CREATININE: 1 mg/dL (ref 0.6–1.2)
Calcium: 10.8 mg/dL — ABNORMAL HIGH (ref 8.0–10.3)
GLUCOSE: 199 mg/dL — AB (ref 73–118)
Potassium: 3.2 mEq/L — ABNORMAL LOW (ref 3.3–4.7)
SODIUM: 134 meq/L (ref 128–145)
Total Protein: 6.7 g/dL (ref 6.4–8.1)

## 2015-06-10 LAB — URINALYSIS, MICROSCOPIC (CHCC SATELLITE)
BLOOD: NEGATIVE
Bilirubin (Urine): NEGATIVE
Glucose: NEGATIVE mg/dL
Ketones: NEGATIVE mg/dL
LEUKOCYTE ESTERASE: NEGATIVE
NITRITE: NEGATIVE
PH: 5 (ref 4.60–8.00)
Protein: 30 mg/dL
Specific Gravity, Urine: 1.025 (ref 1.003–1.035)
UROBILINOGEN UR: 0.2 mg/dL (ref 0.2–1)

## 2015-06-10 LAB — MANUAL DIFFERENTIAL (CHCC SATELLITE)
ALC: 2 10*3/uL (ref 0.6–2.2)
ANC (CHCC HP manual diff): 1.7 10*3/uL (ref 1.5–6.7)
BAND NEUTROPHILS: 3 % (ref 0–10)
Eos: 2 % (ref 0–7)
LYMPH: 48 % (ref 14–48)
MONO: 11 % (ref 0–13)
PLATELET MORPHOLOGY: NORMAL
PLT EST ~~LOC~~: ADEQUATE
SEG: 36 % — ABNORMAL LOW (ref 40–75)

## 2015-06-10 LAB — CBC WITH DIFFERENTIAL (CANCER CENTER ONLY)
HEMATOCRIT: 33.7 % — AB (ref 34.8–46.6)
HGB: 11.3 g/dL — ABNORMAL LOW (ref 11.6–15.9)
MCH: 24.5 pg — ABNORMAL LOW (ref 26.0–34.0)
MCHC: 33.5 g/dL (ref 32.0–36.0)
MCV: 73 fL — ABNORMAL LOW (ref 81–101)
PLATELETS: 204 10*3/uL (ref 145–400)
RBC: 4.61 10*6/uL (ref 3.70–5.32)
RDW: 14.6 % (ref 11.1–15.7)
WBC: 4.3 10*3/uL (ref 3.9–10.0)

## 2015-06-10 LAB — CHCC SATELLITE - SMEAR

## 2015-06-10 MED ORDER — SULFAMETHOXAZOLE-TRIMETHOPRIM 800-160 MG PO TABS: 1.0000 | ORAL_TABLET | Freq: Two times a day (BID) | ORAL | Status: DC

## 2015-06-10 NOTE — Progress Notes (Signed)
Hematology and Oncology Follow Up Visit  Megan Tate [id number] 09-13-1946 69 y.o. 06/10/2015   Principle Diagnosis:  Chronic lymphocytic leukemia- Trisomy 12  Current Therapy:   Status post 6 cycles of Gazyva/Bendamustine - completed4/2016 Maintenance Gazyva every 2 months s/p cycle 1    Interim History:  [name] is here today with her husband and daughter with c/o fatigue, SOB with exertion, no appetite and fever at night. She is afebrile at this time.  She has had no n/v, cough, rash, dizziness, chest pain, palpitations, abdominal pain, constipation, blood in urine or stool. She states that she is urinating normally and does feel like she empties her bladder. She has no burning or pain when she urinates.  She has had some diarrhea but this has tapered off the last few days.  No lymphadenopathy found on exam.  She is not eating well but is staying hydrated. She has had some weight loss in the last month. We discussed adding boost to her daily meals drinking one 3 times daily. She is going to try this.  Her blood sugars are still uncontrolled. She is 199 today. She is on Levemir.  She has no swelling or tenderness in her extremities.   Medications:    Medication List       This list is accurate as of: 06/10/15  3:49 PM.  Always use your most recent med list.               bisacodyl 5 MG EC tablet  Commonly known as:  DULCOLAX  Take 1 tablet (5 mg total) by mouth 2 (two) times daily as needed for moderate constipation.     cloNIDine 0.2 MG tablet  Commonly known as:  CATAPRES  Take 0.2 mg by mouth 2 (two) times daily.     dorzolamide-timolol 22.3-6.8 MG/ML ophthalmic solution  Commonly known as:  COSOPT  Place 1 drop into both eyes 2 (two) times daily.     EDARBI 40 MG Tabs  Generic drug:  Azilsartan Medoxomil  Take 1 tablet by mouth daily.     esomeprazole 20 MG capsule  Commonly known as:  NEXIUM  Take 1 capsule (20 mg total) by mouth 2 (two) times daily before a  meal.     fluconazole 200 MG tablet  Commonly known as:  DIFLUCAN  Take 1 tablet (200 mg total) by mouth daily.     glucose blood test strip  Monitor blood glucose daily before every meal and at bedtime     KOMBIGLYZE XR 2.01-999 MG Tb24  Generic drug:  Saxagliptin-Metformin  Take by mouth every morning.     LEVEMIR FLEXTOUCH 100 UNIT/ML Pen  Generic drug:  Insulin Detemir  Inject 10 Units into the skin every morning.     levofloxacin 500 MG tablet  Commonly known as:  LEVAQUIN  Take 1 tablet (500 mg total) by mouth daily.     lidocaine-prilocaine cream  Commonly known as:  EMLA  Apply to Port-A-Cath site 1 hour ride to treatment     metoCLOPramide 10 MG tablet  Commonly known as:  REGLAN  Take 1 tablet (10 mg total) by mouth 3 (three) times daily before meals.     metoprolol succinate 100 MG 24 hr tablet  Commonly known as:  TOPROL-XL  Take 100 mg by mouth daily with lunch.     ondansetron 8 MG tablet  Commonly known as:  ZOFRAN  Take 1 tablet (8 mg total) by mouth 2 (two) times daily.  Start the day after chemo for 2 days. Then take as needed for nausea or vomiting.     ONE TOUCH ULTRA SYSTEM KIT W/DEVICE Kit  1 kit by Does not apply route once. Check blood sugar daily before meals and at bedtime.     orphenadrine 100 MG tablet  Commonly known as:  NORFLEX  Take 1 tablet (100 mg total) by mouth 2 (two) times daily as needed for muscle spasms.     oxyCODONE 5 MG immediate release tablet  Commonly known as:  Oxy IR/ROXICODONE  Take 1-2 tablets (5-10 mg total) by mouth every 6 (six) hours as needed for severe pain.     prochlorperazine 10 MG tablet  Commonly known as:  COMPAZINE  Take 1 tablet (10 mg total) by mouth every 6 (six) hours as needed (Nausea or vomiting).     rosuvastatin 20 MG tablet  Commonly known as:  CRESTOR  Take 20 mg by mouth daily.     spironolactone 25 MG tablet  Commonly known as:  ALDACTONE  TAKE ONE TABLET BY MOUTH ONE TIME DAILY      sulfamethoxazole-trimethoprim 800-160 MG per tablet  Commonly known as:  BACTRIM DS,SEPTRA DS  Take 1 tablet by mouth 2 (two) times daily.        Allergies:  Allergies  Allergen Reactions  . Tylenol [Acetaminophen] Other (See Comments)    Messes with stomach    Past Medical History, Surgical history, Social history, and Family History were reviewed and updated.  Review of Systems: All other 10 point review of systems is negative.   Physical Exam:  height is 5' 4" (1.626 m). Her oral temperature is 98.4 F (36.9 C). Her blood pressure is 178/91 and her pulse is 84. Her respiration is 16.   Wt Readings from Last 3 Encounters:  05/26/15 193 lb (87.544 kg)  01/26/15 209 lb (94.802 kg)  12/29/14 211 lb (95.709 kg)    Ocular: Sclerae unicteric, pupils equal, round and reactive to light Ear-nose-throat: Oropharynx clear, dentition fair Lymphatic: No cervical or supraclavicular adenopathy Lungs no rales or rhonchi, good excursion bilaterally Heart regular rate and rhythm, no murmur appreciated Abd soft, nontender, positive bowel sounds MSK no focal spinal tenderness, no joint edema Neuro: non-focal, well-oriented, appropriate affect Breasts: Deferred  Lab Results  Component Value Date   WBC 4.3 06/10/2015   HGB 11.3* 06/10/2015   HCT 33.7* 06/10/2015   MCV 73* 06/10/2015   PLT 204 06/10/2015   No results found for: FERRITIN, IRON, TIBC, UIBC, IRONPCTSAT Lab Results  Component Value Date   RETICCTPCT 1.2 04/07/2014   RBC 4.61 06/10/2015   RETICCTABS 65.4 04/07/2014   No results found for: [facility] Lab Results  Component Value Date   IGGSERUM 545* 03/30/2015   IGA 41* 03/30/2015   IGMSERUM <5* 03/30/2015   Lab Results  Component Value Date   TOTALPROTELP 5.5* 03/30/2015   ALBUMINELP 3.4* 03/30/2015   A1GS 0.3 03/30/2015   A2GS 0.8 03/30/2015   BETS 0.4 03/30/2015   BETA2SER 0.2 03/30/2015   GAMS 0.5* 03/30/2015   MSPIKE NOT DET  08/16/2014   SPEI * 03/30/2015     Chemistry      Component Value Date/Time   NA 134 06/10/2015 1340   NA 136 11/16/2014 1209   K 3.2* 06/10/2015 1340   K 3.2* 11/16/2014 1209   CL 101 06/10/2015 1340   CL 101 11/16/2014 1209   CO2 27 06/10/2015 1340   CO2 28  11/16/2014 1209   BUN 9 06/10/2015 1340   BUN 7 11/16/2014 1209   CREATININE 1.0 06/10/2015 1340   CREATININE 0.75 11/16/2014 1209      Component Value Date/Time   CALCIUM 10.8* 06/10/2015 1340   CALCIUM 9.7 11/16/2014 1209   ALKPHOS 63 06/10/2015 1340   ALKPHOS 52 11/16/2014 1209   AST 19 06/10/2015 1340   AST 11 11/16/2014 1209   ALT 14 06/10/2015 1340   ALT <8 11/16/2014 1209   BILITOT 0.80 06/10/2015 1340   BILITOT 0.4 11/16/2014 1209     Impression and Plan: Ms. Sharpe is 69 year old African-American female with chronic lymphocytic leukemia. She completed treatment with Gazyva and Bendamustine in April and is now on Maintenance Gazyva.  She is here today with c/o fatigue, SOB with exertion, no appetite and fever in the evenings. Her blood sugars are still uncontrolled. Her blood glucose today was 199.  Her CBC was unremarkable. We did a UA on her which showed bacteria.  We will have her take Bactrim BID for 4 days. Prescription was sent to the pharmacy.  We will see her back at the end of October for cycle 2 of Gazyva.  Both she and her husband know to call here with any questions or concerns. We can certainly see her sooner if need be.   Eliezer Bottom, NP 9/9/20163:49 PM

## 2015-06-30 ENCOUNTER — Other Ambulatory Visit: Payer: Medicare Other

## 2015-07-22 ENCOUNTER — Other Ambulatory Visit: Payer: Self-pay | Admitting: Hematology & Oncology

## 2015-07-26 ENCOUNTER — Other Ambulatory Visit: Payer: Medicare Other

## 2015-07-26 ENCOUNTER — Encounter: Payer: Self-pay | Admitting: Family

## 2015-07-26 ENCOUNTER — Other Ambulatory Visit (HOSPITAL_BASED_OUTPATIENT_CLINIC_OR_DEPARTMENT_OTHER): Payer: Medicare Other

## 2015-07-26 ENCOUNTER — Ambulatory Visit (HOSPITAL_BASED_OUTPATIENT_CLINIC_OR_DEPARTMENT_OTHER): Payer: Medicare Other

## 2015-07-26 ENCOUNTER — Ambulatory Visit (HOSPITAL_BASED_OUTPATIENT_CLINIC_OR_DEPARTMENT_OTHER): Payer: Medicare Other | Admitting: Family

## 2015-07-26 ENCOUNTER — Ambulatory Visit: Payer: Medicare Other | Admitting: Family

## 2015-07-26 ENCOUNTER — Ambulatory Visit: Payer: Medicare Other

## 2015-07-26 VITALS — BP 196/72 | HR 45 | Temp 98.2°F | Resp 18

## 2015-07-26 DIAGNOSIS — C9110 Chronic lymphocytic leukemia of B-cell type not having achieved remission: Secondary | ICD-10-CM | POA: Diagnosis present

## 2015-07-26 DIAGNOSIS — Z5112 Encounter for antineoplastic immunotherapy: Secondary | ICD-10-CM

## 2015-07-26 LAB — CBC WITH DIFFERENTIAL (CANCER CENTER ONLY)
HCT: 29.9 % — ABNORMAL LOW (ref 34.8–46.6)
HGB: 10.1 g/dL — ABNORMAL LOW (ref 11.6–15.9)
MCH: 24.5 pg — ABNORMAL LOW (ref 26.0–34.0)
MCHC: 33.8 g/dL (ref 32.0–36.0)
MCV: 72 fL — AB (ref 81–101)
Platelets: 154 10*3/uL (ref 145–400)
RBC: 4.13 10*6/uL (ref 3.70–5.32)
RDW: 15.8 % — AB (ref 11.1–15.7)
WBC: 3.9 10*3/uL (ref 3.9–10.0)

## 2015-07-26 LAB — CMP (CANCER CENTER ONLY)
ALBUMIN: 3.6 g/dL (ref 3.3–5.5)
ALK PHOS: 78 U/L (ref 26–84)
ALT: 15 U/L (ref 10–47)
AST: 19 U/L (ref 11–38)
BUN, Bld: 14 mg/dL (ref 7–22)
CALCIUM: 11 mg/dL — AB (ref 8.0–10.3)
CO2: 27 mEq/L (ref 18–33)
Chloride: 104 mEq/L (ref 98–108)
Creat: 0.9 mg/dl (ref 0.6–1.2)
GLUCOSE: 200 mg/dL — AB (ref 73–118)
POTASSIUM: 3.6 meq/L (ref 3.3–4.7)
Sodium: 135 mEq/L (ref 128–145)
TOTAL PROTEIN: 6.1 g/dL — AB (ref 6.4–8.1)
Total Bilirubin: 0.6 mg/dl (ref 0.20–1.60)

## 2015-07-26 LAB — MANUAL DIFFERENTIAL (CHCC SATELLITE)
ALC: 1.6 10*3/uL (ref 0.6–2.2)
ANC (CHCC MAN DIFF): 1.8 10*3/uL (ref 1.5–6.7)
Eos: 1 % (ref 0–7)
LYMPH: 42 % (ref 14–48)
MONO: 10 % (ref 0–13)
PLATELET MORPHOLOGY: NORMAL
PLT EST ~~LOC~~: ADEQUATE
SEG: 47 % (ref 40–75)

## 2015-07-26 LAB — CHCC SATELLITE - SMEAR

## 2015-07-26 MED ORDER — SODIUM CHLORIDE 0.9 % IV SOLN
Freq: Once | INTRAVENOUS | Status: AC
  Administered 2015-07-26: 11:00:00 via INTRAVENOUS

## 2015-07-26 MED ORDER — IBUPROFEN 200 MG PO TABS
400.0000 mg | ORAL_TABLET | Freq: Once | ORAL | Status: AC
  Administered 2015-07-26: 400 mg via ORAL

## 2015-07-26 MED ORDER — FAMOTIDINE IN NACL 20-0.9 MG/50ML-% IV SOLN
INTRAVENOUS | Status: AC
  Filled 2015-07-26: qty 50

## 2015-07-26 MED ORDER — FAMOTIDINE IN NACL 20-0.9 MG/50ML-% IV SOLN
40.0000 mg | Freq: Once | INTRAVENOUS | Status: AC
Start: 2015-07-26 — End: 2015-07-26
  Administered 2015-07-26: 40 mg via INTRAVENOUS

## 2015-07-26 MED ORDER — SODIUM CHLORIDE 0.9 % IJ SOLN
10.0000 mL | INTRAMUSCULAR | Status: DC | PRN
  Administered 2015-07-26: 10 mL
  Filled 2015-07-26: qty 10

## 2015-07-26 MED ORDER — HEPARIN SOD (PORK) LOCK FLUSH 100 UNIT/ML IV SOLN
500.0000 [IU] | Freq: Once | INTRAVENOUS | Status: AC | PRN
  Administered 2015-07-26: 500 [IU]
  Filled 2015-07-26: qty 5

## 2015-07-26 MED ORDER — DIPHENHYDRAMINE HCL 50 MG/ML IJ SOLN
50.0000 mg | Freq: Once | INTRAMUSCULAR | Status: AC
  Administered 2015-07-26: 50 mg via INTRAVENOUS

## 2015-07-26 MED ORDER — DIPHENHYDRAMINE HCL 50 MG/ML IJ SOLN
INTRAMUSCULAR | Status: AC
  Filled 2015-07-26: qty 1

## 2015-07-26 MED ORDER — IBUPROFEN 200 MG PO TABS
ORAL_TABLET | ORAL | Status: AC
  Filled 2015-07-26: qty 2

## 2015-07-26 MED ORDER — SODIUM CHLORIDE 0.9 % IV SOLN
1000.0000 mg | Freq: Once | INTRAVENOUS | Status: AC
  Administered 2015-07-26: 1000 mg via INTRAVENOUS
  Filled 2015-07-26: qty 40

## 2015-07-26 NOTE — Patient Instructions (Signed)
Obinutuzumab injection What is this medicine? OBINUTUZUMAB (OH bi nue TOOZ ue mab) is a monoclonal antibody. It is used to treat chronic lymphocytic leukemia. This drug targets a specific protein on cancer cells and stops them from growing. This medicine may be used for other purposes; ask your health care provider or pharmacist if you have questions. COMMON BRAND NAME(S): GAZYVA What should I tell my health care provider before I take this medicine? They need to know if you have any of these conditions: -hepatitis -low blood counts, like low white cell, platelet, or red cell counts -lung or breathing disease -heart disease -an unusual or allergic reaction to ofatumumab, other medicines, foods, dyes, or preservatives -pregnant or trying to get pregnant -breast-feeding How should I use this medicine? This medicine is for infusion into a vein. It is given by a health care professional in a hospital or clinic setting. Talk to your pediatrician regarding the use of this medicine in children. Special care may be needed. Overdosage: If you think you've taken too much of this medicine contact a poison control center or emergency room at once. Overdosage: If you think you have taken too much of this medicine contact a poison control center or emergency room at once. NOTE: This medicine is only for you. Do not share this medicine with others. What if I miss a dose? Keep appointments for follow-up doses as directed. It is important not to miss your dose. Call your doctor or health care professional if you are unable to keep an appointment. What may interact with this medicine? -live virus vaccines This list may not describe all possible interactions. Give your health care provider a list of all the medicines, herbs, non-prescription drugs, or dietary supplements you use. Also tell them if you smoke, drink alcohol, or use illegal drugs. Some items may interact with your medicine. What should I watch  for while using this medicine? Report any side effects that you notice during your treatment right away, such as changes in your breathing, fever, chills, dizziness or lightheadedness. These effects are more common with the first dose. Visit your prescriber or health care professional for checks on your progress. You will need to have regular blood work. Report any other side effects. The side effects of this medicine can continue after you finish your treatment. Continue your course of treatment even though you feel ill unless your doctor tells you to stop. Call your doctor or health care professional for advice if you get a fever, chills or sore throat, or other symptoms of a cold or flu. Do not treat yourself. This drug decreases your body's ability to fight infections. Try to avoid being around people who are sick. This medicine may increase your risk to bruise or bleed. Call your doctor or health care professional if you notice any unusual bleeding. Be careful brushing and flossing your teeth or using a toothpick because you may get an infection or bleed more easily. If you have any dental work done, tell your dentist you are receiving this medicine. Avoid taking products that contain aspirin, acetaminophen, ibuprofen, naproxen, or ketoprofen unless instructed by your doctor. These medicines may hide a fever. Do not become pregnant while taking this medicine. Women should inform their doctor if they wish to become pregnant or think they might be pregnant. There is a potential for serious side effects to an unborn child. Talk to your health care professional or pharmacist for more information. Do not breast-feed an infant while taking this   medicine. What side effects may I notice from receiving this medicine? Side effects that you should report to your doctor or health care professional as soon as possible: -allergic reactions like skin rash, itching or hives, swelling of the face, lips, or  tongue -breathing problems -changes in vision -chest pain or chest tightness -chills -confusion, trouble speaking or understanding -cough -diarrhea -dizziness -fainting spells -fever -general ill feeling or flu-like symptoms -lightheadedness -loss of balance or coordination -nausea, vomiting -right upper belly pain -trouble walking -unusual bleeding or bruising -unusually weak or tired -yellowing of the eyes or skin Side effects that usually do not require medical attention (Report these to your doctor or health care professional if they continue or are bothersome.): -muscle pain -muscle cramps This list may not describe all possible side effects. Call your doctor for medical advice about side effects. You may report side effects to FDA at 1-800-FDA-1088. Where should I keep my medicine? This drug is only given in a hospital or clinic and will not be stored at home. NOTE: This sheet is a summary. It may not cover all possible information. If you have questions about this medicine, talk to your doctor, pharmacist, or health care provider.  2015, [employer] Standard. (2012-08-07 18:24:29)  

## 2015-07-26 NOTE — Progress Notes (Signed)
Hematology and Oncology Follow Up Visit  Megan Tate [id number] [birth year] 69 y.o. 07/26/2015   Principle Diagnosis:  Chronic lymphocytic leukemia- Trisomy 12  Current Therapy:   Status post 6 cycles of Gazyva/Bendamustine - completed4/2016 Maintenance Gazyva every 2 months s/p cycle 8    Interim History:  [name] is here today with her husband for a follow-up and treatment. She is feeling much better. Her UTI has resolved.  She has had no fever, chills, n/v, cough, rash, dizziness, chest pain, palpitations, abdominal pain or changes in her bowel or bladder habits. She denies SOB.  No lymphadenopathy found on exam. She has had no episodes of bleeding or bruising.  Her appetite is improved and she is making sure to stay hydrated. Her weight is unchanged. Her blood sugars are still elevated. She is 200 today on her CMP. She did have breakfast this morning. She is currently on Janumet and Levemir.  She has no swelling, tenderness, numbness or tingling in her extremities.   Medications:    Medication List       This list is accurate as of: 07/26/15  3:39 PM.  Always use your most recent med list.               bisacodyl 5 MG EC tablet  Commonly known as:  DULCOLAX  Take 1 tablet (5 mg total) by mouth 2 (two) times daily as needed for moderate constipation.     cloNIDine 0.2 MG tablet  Commonly known as:  CATAPRES  Take 0.2 mg by mouth 2 (two) times daily.     dorzolamide-timolol 22.3-6.8 MG/ML ophthalmic solution  Commonly known as:  COSOPT  Place 1 drop into both eyes 2 (two) times daily.     EDARBI 40 MG Tabs  Generic drug:  Azilsartan Medoxomil  Take 1 tablet by mouth daily.     esomeprazole 20 MG capsule  Commonly known as:  NEXIUM  Take 1 capsule (20 mg total) by mouth 2 (two) times daily before a meal.     fluconazole 200 MG tablet  Commonly known as:  DIFLUCAN  Take 1 tablet (200 mg total) by mouth daily.     glucose blood test strip  Monitor blood  glucose daily before every meal and at bedtime     JANUMET XR 50-1000 MG Tb24  Generic drug:  SitaGLIPtin-MetFORMIN HCl  Take 2 tablets by mouth daily.     KOMBIGLYZE XR 2.01-999 MG Tb24  Generic drug:  Saxagliptin-Metformin  Take by mouth every morning.     LEVEMIR FLEXTOUCH 100 UNIT/ML Pen  Generic drug:  Insulin Detemir  Inject 10 Units into the skin every morning.     levofloxacin 500 MG tablet  Commonly known as:  LEVAQUIN  Take 1 tablet (500 mg total) by mouth daily.     lidocaine-prilocaine cream  Commonly known as:  EMLA  Apply to Port-A-Cath site 1 hour ride to treatment     losartan 100 MG tablet  Commonly known as:  COZAAR  Take 100 mg by mouth daily.     metoCLOPramide 10 MG tablet  Commonly known as:  REGLAN  Take 1 tablet (10 mg total) by mouth 3 (three) times daily before meals.     metoprolol succinate 100 MG 24 hr tablet  Commonly known as:  TOPROL-XL  Take 100 mg by mouth daily with lunch.     ondansetron 8 MG tablet  Commonly known as:  ZOFRAN  Take 1 tablet (8 mg total) by  mouth 2 (two) times daily. Start the day after chemo for 2 days. Then take as needed for nausea or vomiting.     ONE TOUCH ULTRA SYSTEM KIT W/DEVICE Kit  1 kit by Does not apply route once. Check blood sugar daily before meals and at bedtime.     orphenadrine 100 MG tablet  Commonly known as:  NORFLEX  Take 1 tablet (100 mg total) by mouth 2 (two) times daily as needed for muscle spasms.     oxyCODONE 5 MG immediate release tablet  Commonly known as:  Oxy IR/ROXICODONE  Take 1-2 tablets (5-10 mg total) by mouth every 6 (six) hours as needed for severe pain.     prochlorperazine 10 MG tablet  Commonly known as:  COMPAZINE  Take 1 tablet (10 mg total) by mouth every 6 (six) hours as needed (Nausea or vomiting).     rosuvastatin 20 MG tablet  Commonly known as:  CRESTOR  Take 20 mg by mouth daily.     spironolactone 25 MG tablet  Commonly known as:  ALDACTONE  TAKE ONE  TABLET BY MOUTH ONE TIME DAILY     sulfamethoxazole-trimethoprim 800-160 MG tablet  Commonly known as:  BACTRIM DS,SEPTRA DS  Take 1 tablet by mouth 2 (two) times daily.        Allergies:  Allergies  Allergen Reactions  . Tylenol [Acetaminophen] Other (See Comments)    Messes with stomach    Past Medical History, Surgical history, Social history, and Family History were reviewed and updated.  Review of Systems: All other 10 point review of systems is negative.   Physical Exam:  vitals were not taken for this visit.  Wt Readings from Last 3 Encounters:  05/26/15 193 lb (87.544 kg)  01/26/15 209 lb (94.802 kg)  12/29/14 211 lb (95.709 kg)    Ocular: Sclerae unicteric, pupils equal, round and reactive to light Ear-nose-throat: Oropharynx clear, dentition fair Lymphatic: No cervical or supraclavicular adenopathy Lungs no rales or rhonchi, good excursion bilaterally Heart regular rate and rhythm, no murmur appreciated Abd soft, nontender, positive bowel sounds MSK no focal spinal tenderness, no joint edema Neuro: non-focal, well-oriented, appropriate affect Breasts: Deferred  Lab Results  Component Value Date   WBC 3.9 07/26/2015   HGB 10.1* 07/26/2015   HCT 29.9* 07/26/2015   MCV 72* 07/26/2015   PLT 154 07/26/2015   No results found for: FERRITIN, IRON, TIBC, UIBC, IRONPCTSAT Lab Results  Component Value Date   RETICCTPCT 1.2 04/07/2014   RBC 4.13 07/26/2015   RETICCTABS 65.4 04/07/2014   No results found for: Nils Pyle [facility] Lab Results  Component Value Date   IGGSERUM 545* 03/30/2015   IGA 41* 03/30/2015   IGMSERUM <5* 03/30/2015   Lab Results  Component Value Date   TOTALPROTELP 5.5* 03/30/2015   ALBUMINELP 3.4* 03/30/2015   A1GS 0.3 03/30/2015   A2GS 0.8 03/30/2015   BETS 0.4 03/30/2015   BETA2SER 0.2 03/30/2015   GAMS 0.5* 03/30/2015   MSPIKE NOT DET 08/16/2014   SPEI * 03/30/2015     Chemistry      Component Value  Date/Time   NA 135 07/26/2015 0910   NA 136 11/16/2014 1209   K 3.6 07/26/2015 0910   K 3.2* 11/16/2014 1209   CL 104 07/26/2015 0910   CL 101 11/16/2014 1209   CO2 27 07/26/2015 0910   CO2 28 11/16/2014 1209   BUN 14 07/26/2015 0910   BUN 7 11/16/2014 1209   CREATININE 0.9  07/26/2015 0910   CREATININE 0.75 11/16/2014 1209      Component Value Date/Time   CALCIUM 11.0* 07/26/2015 0910   CALCIUM 9.7 11/16/2014 1209   ALKPHOS 78 07/26/2015 0910   ALKPHOS 52 11/16/2014 1209   AST 19 07/26/2015 0910   AST 11 11/16/2014 1209   ALT 15 07/26/2015 0910   ALT <8 11/16/2014 1209   BILITOT 0.60 07/26/2015 0910   BILITOT 0.4 11/16/2014 1209     Impression and Plan: Ms. Maeda is 69 year old African-American female with chronic lymphocytic leukemia. She completed treatment with Gazyva and Bendamustine in April and is now on Maintenance Gazyva. She is feeling good at this time and has no complaints.  We will proceed with treatment today as planned.  She is still working to control her blood sugars. She is 200 today (not fasting). She is asymptomatic with this and is taking her medication as prescribed.  She will get a new treatment and follow-up schedule today.  Both she and her husband know to call here with any questions or concerns. We can certainly see her sooner if need be.   Eliezer Bottom, NP 10/25/20163:39 PM

## 2015-09-23 ENCOUNTER — Ambulatory Visit (HOSPITAL_BASED_OUTPATIENT_CLINIC_OR_DEPARTMENT_OTHER): Payer: Medicare Other | Admitting: Hematology & Oncology

## 2015-09-23 ENCOUNTER — Other Ambulatory Visit (HOSPITAL_BASED_OUTPATIENT_CLINIC_OR_DEPARTMENT_OTHER): Payer: Medicare Other

## 2015-09-23 ENCOUNTER — Ambulatory Visit (HOSPITAL_BASED_OUTPATIENT_CLINIC_OR_DEPARTMENT_OTHER): Payer: Medicare Other

## 2015-09-23 VITALS — BP 197/69 | HR 70 | Temp 98.7°F | Resp 18 | Wt 195.0 lb

## 2015-09-23 VITALS — BP 188/66 | HR 49 | Temp 98.4°F | Resp 18

## 2015-09-23 DIAGNOSIS — C9111 Chronic lymphocytic leukemia of B-cell type in remission: Secondary | ICD-10-CM | POA: Diagnosis present

## 2015-09-23 DIAGNOSIS — C9110 Chronic lymphocytic leukemia of B-cell type not having achieved remission: Secondary | ICD-10-CM

## 2015-09-23 DIAGNOSIS — Z5112 Encounter for antineoplastic immunotherapy: Secondary | ICD-10-CM | POA: Diagnosis present

## 2015-09-23 LAB — CMP (CANCER CENTER ONLY)
ALBUMIN: 3.8 g/dL (ref 3.3–5.5)
ALT(SGPT): 12 U/L (ref 10–47)
AST: 18 U/L (ref 11–38)
Alkaline Phosphatase: 65 U/L (ref 26–84)
BUN, Bld: 15 mg/dL (ref 7–22)
CHLORIDE: 102 meq/L (ref 98–108)
CO2: 27 meq/L (ref 18–33)
CREATININE: 1.2 mg/dL (ref 0.6–1.2)
Calcium: 10.7 mg/dL — ABNORMAL HIGH (ref 8.0–10.3)
GLUCOSE: 224 mg/dL — AB (ref 73–118)
Potassium: 3.6 mEq/L (ref 3.3–4.7)
SODIUM: 140 meq/L (ref 128–145)
Total Bilirubin: 0.6 mg/dl (ref 0.20–1.60)
Total Protein: 6.3 g/dL — ABNORMAL LOW (ref 6.4–8.1)

## 2015-09-23 LAB — CBC WITH DIFFERENTIAL (CANCER CENTER ONLY)
HEMATOCRIT: 30.7 % — AB (ref 34.8–46.6)
HEMOGLOBIN: 10.2 g/dL — AB (ref 11.6–15.9)
MCH: 24.3 pg — AB (ref 26.0–34.0)
MCHC: 33.2 g/dL (ref 32.0–36.0)
MCV: 73 fL — AB (ref 81–101)
Platelets: 153 10*3/uL (ref 145–400)
RBC: 4.2 10*6/uL (ref 3.70–5.32)
RDW: 14.9 % (ref 11.1–15.7)
WBC: 4.1 10*3/uL (ref 3.9–10.0)

## 2015-09-23 LAB — MANUAL DIFFERENTIAL (CHCC SATELLITE)
ALC: 1.6 10*3/uL (ref 0.6–2.2)
ANC (CHCC HP manual diff): 1.9 10*3/uL (ref 1.5–6.7)
BASO: 1 % (ref 0–2)
Band Neutrophils: 3 % (ref 0–10)
EOS: 2 % (ref 0–7)
LYMPH: 39 % (ref 14–48)
MONO: 13 % (ref 0–13)
PLT EST ~~LOC~~: ADEQUATE
SEG: 42 % (ref 40–75)

## 2015-09-23 LAB — CHCC SATELLITE - SMEAR

## 2015-09-23 MED ORDER — FAMOTIDINE IN NACL 20-0.9 MG/50ML-% IV SOLN
40.0000 mg | Freq: Once | INTRAVENOUS | Status: AC
  Administered 2015-09-23: 40 mg via INTRAVENOUS

## 2015-09-23 MED ORDER — DIPHENHYDRAMINE HCL 50 MG/ML IJ SOLN
INTRAMUSCULAR | Status: AC
  Filled 2015-09-23: qty 1

## 2015-09-23 MED ORDER — OBINUTUZUMAB CHEMO INJECTION 1000 MG/40ML
1000.0000 mg | Freq: Once | INTRAVENOUS | Status: AC
  Administered 2015-09-23: 1000 mg via INTRAVENOUS
  Filled 2015-09-23: qty 40

## 2015-09-23 MED ORDER — SODIUM CHLORIDE 0.9 % IJ SOLN
10.0000 mL | INTRAMUSCULAR | Status: DC | PRN
  Administered 2015-09-23: 10 mL
  Filled 2015-09-23: qty 10

## 2015-09-23 MED ORDER — IBUPROFEN 200 MG PO TABS
ORAL_TABLET | ORAL | Status: AC
  Filled 2015-09-23: qty 2

## 2015-09-23 MED ORDER — SODIUM CHLORIDE 0.9 % IV SOLN
Freq: Once | INTRAVENOUS | Status: AC
  Administered 2015-09-23: 10:00:00 via INTRAVENOUS

## 2015-09-23 MED ORDER — HEPARIN SOD (PORK) LOCK FLUSH 100 UNIT/ML IV SOLN
500.0000 [IU] | Freq: Once | INTRAVENOUS | Status: AC | PRN
  Administered 2015-09-23: 500 [IU]
  Filled 2015-09-23: qty 5

## 2015-09-23 MED ORDER — DIPHENHYDRAMINE HCL 50 MG/ML IJ SOLN
50.0000 mg | Freq: Once | INTRAMUSCULAR | Status: AC
  Administered 2015-09-23: 50 mg via INTRAVENOUS

## 2015-09-23 MED ORDER — FAMOTIDINE IN NACL 20-0.9 MG/50ML-% IV SOLN
INTRAVENOUS | Status: AC
  Filled 2015-09-23: qty 100

## 2015-09-23 MED ORDER — IBUPROFEN 200 MG PO TABS
400.0000 mg | ORAL_TABLET | Freq: Once | ORAL | Status: AC
  Administered 2015-09-23: 400 mg via ORAL

## 2015-09-23 NOTE — Progress Notes (Signed)
Hematology and Oncology Follow Up Visit  Megan Tate [id number] 1946-04-11 69 y.o. 09/23/2015   Principle Diagnosis:  Chronic lymphocytic leukemia- Trisomy 12  Current Therapy:   Status post 6 cycles of Gazyva/Bendamustine - completed4/2016 Maintenance Gazyva every 2 months s/p cycle 3    Interim History:  Megan Tate is here today with her husband for a follow-up and treatment.  She looks great. She feels well.  Looks like she may have lost a little bit overweight.   She is looking for to Christmas.   her husband is  a Theme park manager. They will be having service on Christmas day.   She is trying to watch her diabetes.   She's had no fever. She's had no urinary issues. She's had no diarrhea. She's had no change in bowel or bladder habits overall. She's had no leg swelling. She's had no rashes.   Overall, her performance status is ECOG 1.   Medications:    Medication List       This list is accurate as of: 09/23/15  9:16 AM.  Always use your most recent med list.               bisacodyl 5 MG EC tablet  Commonly known as:  DULCOLAX  Take 1 tablet (5 mg total) by mouth 2 (two) times daily as needed for moderate constipation.     cloNIDine 0.2 MG tablet  Commonly known as:  CATAPRES  Take 0.2 mg by mouth 2 (two) times daily.     dorzolamide-timolol 22.3-6.8 MG/ML ophthalmic solution  Commonly known as:  COSOPT  Place 1 drop into both eyes 2 (two) times daily.     EDARBI 40 MG Tabs  Generic drug:  Azilsartan Medoxomil  Take 1 tablet by mouth daily.     esomeprazole 20 MG capsule  Commonly known as:  NEXIUM  Take 1 capsule (20 mg total) by mouth 2 (two) times daily before a meal.     fluconazole 200 MG tablet  Commonly known as:  DIFLUCAN  Take 1 tablet (200 mg total) by mouth daily.     glucose blood test strip  Monitor blood glucose daily before every meal and at bedtime     JANUMET XR 50-1000 MG Tb24  Generic drug:  SitaGLIPtin-MetFORMIN HCl  Take 2 tablets by  mouth daily.     KOMBIGLYZE XR 2.01-999 MG Tb24  Generic drug:  Saxagliptin-Metformin  Take by mouth every morning.     LEVEMIR FLEXTOUCH 100 UNIT/ML Pen  Generic drug:  Insulin Detemir  Inject 10 Units into the skin every morning.     levofloxacin 500 MG tablet  Commonly known as:  LEVAQUIN  Take 1 tablet (500 mg total) by mouth daily.     lidocaine-prilocaine cream  Commonly known as:  EMLA  Apply to Port-A-Cath site 1 hour ride to treatment     losartan 100 MG tablet  Commonly known as:  COZAAR  Take 100 mg by mouth daily.     metoCLOPramide 10 MG tablet  Commonly known as:  REGLAN  Take 1 tablet (10 mg total) by mouth 3 (three) times daily before meals.     metoprolol succinate 100 MG 24 hr tablet  Commonly known as:  TOPROL-XL  Take 100 mg by mouth daily with lunch.     ondansetron 8 MG tablet  Commonly known as:  ZOFRAN  Take 1 tablet (8 mg total) by mouth 2 (two) times daily. Start the day after chemo for 2  days. Then take as needed for nausea or vomiting.     ONE TOUCH ULTRA SYSTEM KIT W/DEVICE Kit  1 kit by Does not apply route once. Check blood sugar daily before meals and at bedtime.     orphenadrine 100 MG tablet  Commonly known as:  NORFLEX  Take 1 tablet (100 mg total) by mouth 2 (two) times daily as needed for muscle spasms.     oxyCODONE 5 MG immediate release tablet  Commonly known as:  Oxy IR/ROXICODONE  Take 1-2 tablets (5-10 mg total) by mouth every 6 (six) hours as needed for severe pain.     prochlorperazine 10 MG tablet  Commonly known as:  COMPAZINE  Take 1 tablet (10 mg total) by mouth every 6 (six) hours as needed (Nausea or vomiting).     rosuvastatin 20 MG tablet  Commonly known as:  CRESTOR  Take 20 mg by mouth daily.     spironolactone 25 MG tablet  Commonly known as:  ALDACTONE  TAKE ONE TABLET BY MOUTH ONE TIME DAILY     sulfamethoxazole-trimethoprim 800-160 MG tablet  Commonly known as:  BACTRIM DS,SEPTRA DS  Take 1 tablet by  mouth 2 (two) times daily.        Allergies:  Allergies  Allergen Reactions  . Tylenol [Acetaminophen] Other (See Comments)    Messes with stomach    Past Medical History, Surgical history, Social history, and Family History were reviewed and updated.  Review of Systems: All other 10 point review of systems is negative.   Physical Exam:  weight is 195 lb (88.451 kg). Her oral temperature is 98.7 F (37.1 C). Her blood pressure is 197/69 and her pulse is 70. Her respiration is 18.   Wt Readings from Last 3 Encounters:  09/23/15 195 lb (88.451 kg)  05/26/15 193 lb (87.544 kg)  01/26/15 209 lb (94.802 kg)     well-developed and well-nourished African-American female in no obvious distress. Head and neck exam shows no ocular or oral lesions. She has no adenopathy in the neck. Lungs are clear. Cardiac exam regular rate and rhythm with no murmurs, rubs or bruits. Abdomen is soft. She has no fluid wave. There is no palpable liver or spleen tip. Axillary exam shows no bilateral axillary adenopathy. Back exam shows no tenderness over the spine, ribs or hips. Extremities shows no clubbing, cyanosis or edema. Neurological exam shows no focal neurological deficits. Skin exam shows no rashes, ecchymoses or petechia. Lab Results  Component Value Date   WBC 3.9 07/26/2015   HGB 10.1* 07/26/2015   HCT 29.9* 07/26/2015   MCV 72* 07/26/2015   PLT 154 07/26/2015   No results found for: FERRITIN, IRON, TIBC, UIBC, IRONPCTSAT Lab Results  Component Value Date   RETICCTPCT 1.2 04/07/2014   RBC 4.13 07/26/2015   RETICCTABS 65.4 04/07/2014   No results found for: Nils Pyle [facility] Lab Results  Component Value Date   IGGSERUM 545* 03/30/2015   IGA 41* 03/30/2015   IGMSERUM <5* 03/30/2015   Lab Results  Component Value Date   TOTALPROTELP 5.5* 03/30/2015   ALBUMINELP 3.4* 03/30/2015   A1GS 0.3 03/30/2015   A2GS 0.8 03/30/2015   BETS 0.4 03/30/2015   BETA2SER 0.2  03/30/2015   GAMS 0.5* 03/30/2015   MSPIKE NOT DET 08/16/2014   SPEI * 03/30/2015     Chemistry      Component Value Date/Time   NA 135 07/26/2015 0910   NA 136 11/16/2014 1209   K  3.6 07/26/2015 0910   K 3.2* 11/16/2014 1209   CL 104 07/26/2015 0910   CL 101 11/16/2014 1209   CO2 27 07/26/2015 0910   CO2 28 11/16/2014 1209   BUN 14 07/26/2015 0910   BUN 7 11/16/2014 1209   CREATININE 0.9 07/26/2015 0910   CREATININE 0.75 11/16/2014 1209      Component Value Date/Time   CALCIUM 11.0* 07/26/2015 0910   CALCIUM 9.7 11/16/2014 1209   ALKPHOS 78 07/26/2015 0910   ALKPHOS 52 11/16/2014 1209   AST 19 07/26/2015 0910   AST 11 11/16/2014 1209   ALT 15 07/26/2015 0910   ALT <8 11/16/2014 1209   BILITOT 0.60 07/26/2015 0910   BILITOT 0.4 11/16/2014 1209     Impression and Plan: Megan Tate is 69 year old African-American female with chronic lymphocytic leukemia. She completed treatment with Gazyva and Bendamustine in April and is now on Maintenance Gazyva.    I'm glad that she is doing so well right now. She clearly is in a hematologic remission. Her blood counts look great. I would get her blood smear under the microscope and did not see any atypical lymphocytes.   We will plan to get her back in another 2 months. We will continue the maintenance Gazyva for 2 years.   Volanda Napoleon, MD 12/23/20169:16 AM

## 2015-09-23 NOTE — Patient Instructions (Signed)
Obinutuzumab injection What is this medicine? OBINUTUZUMAB (OH bi nue TOOZ ue mab) is a monoclonal antibody. It is used to treat chronic lymphocytic leukemia (CLL) and a type of non-Hodgkin lymphoma (NHL), follicular lymphoma. This medicine may be used for other purposes; ask your health care provider or pharmacist if you have questions. What should I tell my health care provider before I take this medicine? They need to know if you have any of these conditions: -infection (especially a virus infection such as hepatitis B virus) -lung or breathing disease -heart disease -take medicines that treat or prevent blood clots -an unusual or allergic reaction to obinutuzumab, other medicines, foods, dyes, or preservatives -pregnant or trying to get pregnant -breast-feeding How should I use this medicine? This medicine is for infusion into a vein. It is given by a health care professional in a hospital or clinic setting. Talk to your pediatrician regarding the use of this medicine in children. Special care may be needed. Overdosage: If you think you have taken too much of this medicine contact a poison control center or emergency room at once. NOTE: This medicine is only for you. Do not share this medicine with others. What if I miss a dose? Keep appointments for follow-up doses as directed. It is important not to miss your dose. Call your doctor or health care professional if you are unable to keep an appointment. What may interact with this medicine? -live virus vaccines This list may not describe all possible interactions. Give your health care provider a list of all the medicines, herbs, non-prescription drugs, or dietary supplements you use. Also tell them if you smoke, drink alcohol, or use illegal drugs. Some items may interact with your medicine. What should I watch for while using this medicine? Report any side effects that you notice during your treatment right away, such as changes in your  breathing, fever, chills, dizziness or lightheadedness. These effects are more common with the first dose. Visit your prescriber or health care professional for checks on your progress. You will need to have regular blood work. Report any other side effects. The side effects of this medicine can continue after you finish your treatment. Continue your course of treatment even though you feel ill unless your doctor tells you to stop. Call your doctor or health care professional for advice if you get a fever, chills or sore throat, or other symptoms of a cold or flu. Do not treat yourself. This drug decreases your body's ability to fight infections. Try to avoid being around people who are sick. This medicine may increase your risk to bruise or bleed. Call your doctor or health care professional if you notice any unusual bleeding. What side effects may I notice from receiving this medicine? Side effects that you should report to your doctor or health care professional as soon as possible: -allergic reactions like skin rash, itching or hives, swelling of the face, lips, or tongue -breathing problems -changes in vision -chest pain or chest tightness -confusion -dizziness -loss of balance or coordination -low blood counts - this medicine may decrease the number of white blood cells, red blood cells and platelets. You may be at increased risk for infections and bleeding. -signs of decreased platelets or bleeding - bruising, pinpoint red spots on the skin, black, tarry stools, blood in the urine -signs of infection - fever or chills, cough, sore throat, pain or trouble passing urine -signs and symptoms of liver injury like dark yellow or brown urine; general ill   feeling or flu-like symptoms; light-colored stools; loss of appetite; nausea; right upper belly pain; unusually weak or tired; yellowing of the eyes or skin -trouble speaking or understanding -trouble walking -vomiting Side effects that usually  do not require medical attention (Report these to your doctor or health care professional if they continue or are bothersome.): -constipation -joint pain -muscle pain This list may not describe all possible side effects. Call your doctor for medical advice about side effects. You may report side effects to FDA at 1-800-FDA-1088. Where should I keep my medicine? This drug is only given in a hospital or clinic and will not be stored at home. NOTE: This sheet is a summary. It may not cover all possible information. If you have questions about this medicine, talk to your doctor, pharmacist, or health care provider.    2016, [employer] Standard. (2014-12-03 09:58:07)  

## 2015-09-27 ENCOUNTER — Other Ambulatory Visit: Payer: Self-pay | Admitting: Nurse Practitioner

## 2015-10-17 ENCOUNTER — Other Ambulatory Visit: Payer: Self-pay | Admitting: *Deleted

## 2015-10-17 DIAGNOSIS — C9110 Chronic lymphocytic leukemia of B-cell type not having achieved remission: Secondary | ICD-10-CM

## 2015-10-18 ENCOUNTER — Telehealth: Payer: Self-pay | Admitting: Hematology & Oncology

## 2015-10-18 NOTE — Telephone Encounter (Signed)
APPROVED  SILVERSCRIPT  ID: [id number] MEDICARE PLAN P: [id number] ID: [id number] [place] MEDICAID PLAN P: [id number]  Valid: 07/20/2015 - 10/17/2016  ORPHENADRINE ER 100 MG TABLET

## 2015-11-23 ENCOUNTER — Ambulatory Visit (HOSPITAL_BASED_OUTPATIENT_CLINIC_OR_DEPARTMENT_OTHER): Payer: Medicare Other

## 2015-11-23 ENCOUNTER — Other Ambulatory Visit (HOSPITAL_BASED_OUTPATIENT_CLINIC_OR_DEPARTMENT_OTHER): Payer: Medicare Other

## 2015-11-23 ENCOUNTER — Ambulatory Visit (HOSPITAL_BASED_OUTPATIENT_CLINIC_OR_DEPARTMENT_OTHER): Payer: Medicare Other | Admitting: Family

## 2015-11-23 ENCOUNTER — Encounter: Payer: Self-pay | Admitting: Family

## 2015-11-23 VITALS — BP 171/69 | HR 44 | Temp 98.2°F | Resp 18

## 2015-11-23 VITALS — BP 178/74 | HR 63 | Temp 98.5°F | Resp 16 | Ht 64.0 in | Wt 199.0 lb

## 2015-11-23 DIAGNOSIS — Z5112 Encounter for antineoplastic immunotherapy: Secondary | ICD-10-CM

## 2015-11-23 DIAGNOSIS — C9110 Chronic lymphocytic leukemia of B-cell type not having achieved remission: Secondary | ICD-10-CM

## 2015-11-23 LAB — CBC WITH DIFFERENTIAL (CANCER CENTER ONLY)
HEMATOCRIT: 31.8 % — AB (ref 34.8–46.6)
HGB: 10.6 g/dL — ABNORMAL LOW (ref 11.6–15.9)
MCH: 24.7 pg — ABNORMAL LOW (ref 26.0–34.0)
MCHC: 33.3 g/dL (ref 32.0–36.0)
MCV: 74 fL — ABNORMAL LOW (ref 81–101)
PLATELETS: 153 10*3/uL (ref 145–400)
RBC: 4.3 10*6/uL (ref 3.70–5.32)
RDW: 15.1 % (ref 11.1–15.7)
WBC: 5 10*3/uL (ref 3.9–10.0)

## 2015-11-23 LAB — MANUAL DIFFERENTIAL (CHCC SATELLITE)
ALC: 1.7 10*3/uL (ref 0.6–2.2)
ANC (CHCC HP manual diff): 2.6 10*3/uL (ref 1.5–6.7)
BAND NEUTROPHILS: 2 % (ref 0–10)
Eos: 1 % (ref 0–7)
LYMPH: 33 % (ref 14–48)
MONO: 14 % — ABNORMAL HIGH (ref 0–13)
Metamyelocytes: 1 % — ABNORMAL HIGH (ref 0–0)
PLATELET MORPHOLOGY: NORMAL
PLT EST ~~LOC~~: ADEQUATE
SEG: 49 % (ref 40–75)

## 2015-11-23 LAB — CHCC SATELLITE - SMEAR

## 2015-11-23 LAB — CMP (CANCER CENTER ONLY)
ALBUMIN: 3.8 g/dL (ref 3.3–5.5)
ALT(SGPT): 15 U/L (ref 10–47)
AST: 25 U/L (ref 11–38)
Alkaline Phosphatase: 89 U/L — ABNORMAL HIGH (ref 26–84)
BILIRUBIN TOTAL: 0.7 mg/dL (ref 0.20–1.60)
BUN, Bld: 18 mg/dL (ref 7–22)
CO2: 29 meq/L (ref 18–33)
CREATININE: 1.3 mg/dL — AB (ref 0.6–1.2)
Calcium: 11.3 mg/dL — ABNORMAL HIGH (ref 8.0–10.3)
Chloride: 102 mEq/L (ref 98–108)
GLUCOSE: 182 mg/dL — AB (ref 73–118)
Potassium: 4 mEq/L (ref 3.3–4.7)
SODIUM: 139 meq/L (ref 128–145)
Total Protein: 6.5 g/dL (ref 6.4–8.1)

## 2015-11-23 MED ORDER — IBUPROFEN 200 MG PO TABS
400.0000 mg | ORAL_TABLET | Freq: Once | ORAL | Status: AC
  Administered 2015-11-23: 400 mg via ORAL

## 2015-11-23 MED ORDER — SODIUM CHLORIDE 0.9 % IV SOLN
1000.0000 mg | Freq: Once | INTRAVENOUS | Status: AC
  Administered 2015-11-23: 1000 mg via INTRAVENOUS
  Filled 2015-11-23: qty 40

## 2015-11-23 MED ORDER — HEPARIN SOD (PORK) LOCK FLUSH 100 UNIT/ML IV SOLN
500.0000 [IU] | Freq: Once | INTRAVENOUS | Status: AC | PRN
  Administered 2015-11-23: 500 [IU]
  Filled 2015-11-23: qty 5

## 2015-11-23 MED ORDER — SODIUM CHLORIDE 0.9 % IV SOLN
Freq: Once | INTRAVENOUS | Status: AC
  Administered 2015-11-23: 09:00:00 via INTRAVENOUS

## 2015-11-23 MED ORDER — FAMOTIDINE IN NACL 20-0.9 MG/50ML-% IV SOLN
40.0000 mg | Freq: Once | INTRAVENOUS | Status: AC
  Administered 2015-11-23: 40 mg via INTRAVENOUS

## 2015-11-23 MED ORDER — FAMOTIDINE IN NACL 20-0.9 MG/50ML-% IV SOLN
INTRAVENOUS | Status: AC
  Filled 2015-11-23: qty 100

## 2015-11-23 MED ORDER — SODIUM CHLORIDE 0.9 % IJ SOLN
10.0000 mL | INTRAMUSCULAR | Status: DC | PRN
  Administered 2015-11-23: 10 mL
  Filled 2015-11-23: qty 10

## 2015-11-23 MED ORDER — DIPHENHYDRAMINE HCL 50 MG/ML IJ SOLN
INTRAMUSCULAR | Status: AC
  Filled 2015-11-23: qty 1

## 2015-11-23 MED ORDER — IBUPROFEN 200 MG PO TABS
ORAL_TABLET | ORAL | Status: AC
  Filled 2015-11-23: qty 2

## 2015-11-23 MED ORDER — DIPHENHYDRAMINE HCL 50 MG/ML IJ SOLN
50.0000 mg | Freq: Once | INTRAMUSCULAR | Status: AC
  Administered 2015-11-23: 50 mg via INTRAVENOUS

## 2015-11-23 NOTE — Patient Instructions (Signed)
Obinutuzumab injection What is this medicine? OBINUTUZUMAB (OH bi nue TOOZ ue mab) is a monoclonal antibody. It is used to treat chronic lymphocytic leukemia. This drug targets a specific protein on cancer cells and stops them from growing. This medicine may be used for other purposes; ask your health care provider or pharmacist if you have questions. COMMON BRAND NAME(S): GAZYVA What should I tell my health care provider before I take this medicine? They need to know if you have any of these conditions: -hepatitis -low blood counts, like low white cell, platelet, or red cell counts -lung or breathing disease -heart disease -an unusual or allergic reaction to ofatumumab, other medicines, foods, dyes, or preservatives -pregnant or trying to get pregnant -breast-feeding How should I use this medicine? This medicine is for infusion into a vein. It is given by a health care professional in a hospital or clinic setting. Talk to your pediatrician regarding the use of this medicine in children. Special care may be needed. Overdosage: If you think you've taken too much of this medicine contact a poison control center or emergency room at once. Overdosage: If you think you have taken too much of this medicine contact a poison control center or emergency room at once. NOTE: This medicine is only for you. Do not share this medicine with others. What if I miss a dose? Keep appointments for follow-up doses as directed. It is important not to miss your dose. Call your doctor or health care professional if you are unable to keep an appointment. What may interact with this medicine? -live virus vaccines This list may not describe all possible interactions. Give your health care provider a list of all the medicines, herbs, non-prescription drugs, or dietary supplements you use. Also tell them if you smoke, drink alcohol, or use illegal drugs. Some items may interact with your medicine. What should I watch  for while using this medicine? Report any side effects that you notice during your treatment right away, such as changes in your breathing, fever, chills, dizziness or lightheadedness. These effects are more common with the first dose. Visit your prescriber or health care professional for checks on your progress. You will need to have regular blood work. Report any other side effects. The side effects of this medicine can continue after you finish your treatment. Continue your course of treatment even though you feel ill unless your doctor tells you to stop. Call your doctor or health care professional for advice if you get a fever, chills or sore throat, or other symptoms of a cold or flu. Do not treat yourself. This drug decreases your body's ability to fight infections. Try to avoid being around people who are sick. This medicine may increase your risk to bruise or bleed. Call your doctor or health care professional if you notice any unusual bleeding. Be careful brushing and flossing your teeth or using a toothpick because you may get an infection or bleed more easily. If you have any dental work done, tell your dentist you are receiving this medicine. Avoid taking products that contain aspirin, acetaminophen, ibuprofen, naproxen, or ketoprofen unless instructed by your doctor. These medicines may hide a fever. Do not become pregnant while taking this medicine. Women should inform their doctor if they wish to become pregnant or think they might be pregnant. There is a potential for serious side effects to an unborn child. Talk to your health care professional or pharmacist for more information. Do not breast-feed an infant while taking this   medicine. What side effects may I notice from receiving this medicine? Side effects that you should report to your doctor or health care professional as soon as possible: -allergic reactions like skin rash, itching or hives, swelling of the face, lips, or  tongue -breathing problems -changes in vision -chest pain or chest tightness -chills -confusion, trouble speaking or understanding -cough -diarrhea -dizziness -fainting spells -fever -general ill feeling or flu-like symptoms -lightheadedness -loss of balance or coordination -nausea, vomiting -right upper belly pain -trouble walking -unusual bleeding or bruising -unusually weak or tired -yellowing of the eyes or skin Side effects that usually do not require medical attention (Report these to your doctor or health care professional if they continue or are bothersome.): -muscle pain -muscle cramps This list may not describe all possible side effects. Call your doctor for medical advice about side effects. You may report side effects to FDA at 1-800-FDA-1088. Where should I keep my medicine? This drug is only given in a hospital or clinic and will not be stored at home. NOTE: This sheet is a summary. It may not cover all possible information. If you have questions about this medicine, talk to your doctor, pharmacist, or health care provider.  2015, [employer] Standard. (2012-08-07 18:24:29)  

## 2015-11-23 NOTE — Progress Notes (Signed)
Hematology and Oncology Follow Up Visit  Megan Tate [id number] May 18, 1946 70 y.o. 11/23/2015   Principle Diagnosis:  Chronic lymphocytic leukemia- Trisomy 12  Current Therapy:   Status post 6 cycles of Gazyva/Bendamustine - completed4/2016 Maintenance Gazyva every 2 months s/p cycle 10    Interim History:  [name] is here today with her husband for a follow-up and treatment. She is doing well and has no complaints at this time.  No fever, chills, n/v, cough, rash, dizziness, SOB, chest pain, palpitations, abdominal pain or changes in her bowel or bladder habits.  She states that her blood glucose level have been better controlled. She averages 130-140 on levamir and janumet.  She has a good appetite and is staying well hydrated. Her weight is stable.  No lymphadenopathy found on exam. No episodes of bleeding or bruising.  No swelling, tenderness, numbness or tingling in her extremities.   Medications:    Medication List       This list is accurate as of: 11/23/15  8:56 AM.  Always use your most recent med list.               bisacodyl 5 MG EC tablet  Commonly known as:  DULCOLAX  Take 1 tablet (5 mg total) by mouth 2 (two) times daily as needed for moderate constipation.     cloNIDine 0.2 MG tablet  Commonly known as:  CATAPRES  Take 0.2 mg by mouth 2 (two) times daily.     dorzolamide-timolol 22.3-6.8 MG/ML ophthalmic solution  Commonly known as:  COSOPT  Place 1 drop into both eyes 2 (two) times daily.     EDARBI 40 MG Tabs  Generic drug:  Azilsartan Medoxomil  Take 1 tablet by mouth daily.     esomeprazole 20 MG capsule  Commonly known as:  NEXIUM  Take 1 capsule (20 mg total) by mouth 2 (two) times daily before a meal.     fluconazole 200 MG tablet  Commonly known as:  DIFLUCAN  Take 1 tablet (200 mg total) by mouth daily.     glucose blood test strip  Monitor blood glucose daily before every meal and at bedtime     JANUMET XR 50-1000 MG Tb24    Generic drug:  SitaGLIPtin-MetFORMIN HCl  Take 2 tablets by mouth daily.     KOMBIGLYZE XR 2.01-999 MG Tb24  Generic drug:  Saxagliptin-Metformin  Take by mouth every morning.     LEVEMIR FLEXTOUCH 100 UNIT/ML Pen  Generic drug:  Insulin Detemir  Inject 10 Units into the skin every morning.     levofloxacin 500 MG tablet  Commonly known as:  LEVAQUIN  Take 1 tablet (500 mg total) by mouth daily.     lidocaine-prilocaine cream  Commonly known as:  EMLA  Apply to Port-A-Cath site 1 hour ride to treatment     losartan 100 MG tablet  Commonly known as:  COZAAR  Take 100 mg by mouth daily.     metoCLOPramide 10 MG tablet  Commonly known as:  REGLAN  Take 1 tablet (10 mg total) by mouth 3 (three) times daily before meals.     metoprolol succinate 100 MG 24 hr tablet  Commonly known as:  TOPROL-XL  Take 100 mg by mouth daily with lunch.     ONE TOUCH ULTRA SYSTEM KIT w/Device Kit  1 kit by Does not apply route once. Check blood sugar daily before meals and at bedtime.     orphenadrine 100 MG tablet  Commonly known as:  NORFLEX  Take 1 tablet (100 mg total) by mouth 2 (two) times daily as needed for muscle spasms.     oxyCODONE 5 MG immediate release tablet  Commonly known as:  Oxy IR/ROXICODONE  Take 1-2 tablets (5-10 mg total) by mouth every 6 (six) hours as needed for severe pain.     prochlorperazine 10 MG tablet  Commonly known as:  COMPAZINE  Take 1 tablet (10 mg total) by mouth every 6 (six) hours as needed (Nausea or vomiting).     rosuvastatin 20 MG tablet  Commonly known as:  CRESTOR  Take 20 mg by mouth daily.     spironolactone 25 MG tablet  Commonly known as:  ALDACTONE  TAKE ONE TABLET BY MOUTH ONE TIME DAILY     sulfamethoxazole-trimethoprim 800-160 MG tablet  Commonly known as:  BACTRIM DS,SEPTRA DS  Take 1 tablet by mouth 2 (two) times daily.        Allergies:  Allergies  Allergen Reactions  . Tylenol [Acetaminophen] Other (See Comments)     Messes with stomach    Past Medical History, Surgical history, Social history, and Family History were reviewed and updated.  Review of Systems: All other 10 point review of systems is negative.   Physical Exam:  height is '5\' 4"'  (1.626 m) and weight is 199 lb (90.266 kg). Her oral temperature is 98.5 F (36.9 C). Her blood pressure is 178/74 and her pulse is 63. Her respiration is 16.   Wt Readings from Last 3 Encounters:  11/23/15 199 lb (90.266 kg)  09/23/15 195 lb (88.451 kg)  05/26/15 193 lb (87.544 kg)    Ocular: Sclerae unicteric, pupils equal, round and reactive to light Ear-nose-throat: Oropharynx clear, dentition fair Lymphatic: No cervical supraclavicular or axillary adenopathy Lungs no rales or rhonchi, good excursion bilaterally Heart regular rate and rhythm, no murmur appreciated Abd soft, nontender, positive bowel sounds, no liver or spleen tip palpated on exam MSK no focal spinal tenderness, no joint edema Neuro: non-focal, well-oriented, appropriate affect Breasts: Deferred  Lab Results  Component Value Date   WBC 4.1 09/23/2015   HGB 10.2* 09/23/2015   HCT 30.7* 09/23/2015   MCV 73* 09/23/2015   PLT 153 09/23/2015   No results found for: FERRITIN, IRON, TIBC, UIBC, IRONPCTSAT Lab Results  Component Value Date   RETICCTPCT 1.2 04/07/2014   RBC 4.20 09/23/2015   RETICCTABS 65.4 04/07/2014   No results found for: [facility] Lab Results  Component Value Date   IGGSERUM 545* 03/30/2015   IGA 41* 03/30/2015   IGMSERUM <5* 03/30/2015   Lab Results  Component Value Date   TOTALPROTELP 5.5* 03/30/2015   ALBUMINELP 3.4* 03/30/2015   A1GS 0.3 03/30/2015   A2GS 0.8 03/30/2015   BETS 0.4 03/30/2015   BETA2SER 0.2 03/30/2015   GAMS 0.5* 03/30/2015   MSPIKE NOT DET 08/16/2014   SPEI * 03/30/2015     Chemistry      Component Value Date/Time   NA 140 09/23/2015 0836   NA 136 11/16/2014 1209   K 3.6 09/23/2015 0836   K 3.2*  11/16/2014 1209   CL 102 09/23/2015 0836   CL 101 11/16/2014 1209   CO2 27 09/23/2015 0836   CO2 28 11/16/2014 1209   BUN 15 09/23/2015 0836   BUN 7 11/16/2014 1209   CREATININE 1.2 09/23/2015 0836   CREATININE 0.75 11/16/2014 1209      Component Value Date/Time   CALCIUM 10.7* 09/23/2015  2751   CALCIUM 9.7 11/16/2014 1209   ALKPHOS 65 09/23/2015 0836   ALKPHOS 52 11/16/2014 1209   AST 18 09/23/2015 0836   AST 11 11/16/2014 1209   ALT 12 09/23/2015 0836   ALT <8 11/16/2014 1209   BILITOT 0.60 09/23/2015 0836   BILITOT 0.4 11/16/2014 1209     Impression and Plan: Ms. Ericsson is 70 year old African-American female with chronic lymphocytic leukemia. She completed treatment with Gazyva and Bendamustine in April and is now on Maintenance Gazyva. She continues to do well and is asymptomatic at this time.  We will proceed with treatment today as planned.  She will complete Maintenance Gazyva in June 2018.  She will get a new treatment and follow-up schedule today.  Both she and her husband know to call here with any questions or concerns. We can certainly see her sooner if need be.   Eliezer Bottom, NP 2/22/20178:56 AM

## 2015-12-02 ENCOUNTER — Telehealth: Payer: Self-pay | Admitting: *Deleted

## 2015-12-02 DIAGNOSIS — J069 Acute upper respiratory infection, unspecified: Secondary | ICD-10-CM

## 2015-12-02 MED ORDER — AZITHROMYCIN 250 MG PO TABS: ORAL_TABLET | ORAL | Status: DC

## 2015-12-02 NOTE — Telephone Encounter (Signed)
Patient is c/o productive cough x 2 weeks. No fever. Only other symptoms is fatigue. Spoke with Dr Marin Olp who wants patient to start on a z-pack prophylactically.   Husband aware and new prescription sent.

## 2015-12-29 ENCOUNTER — Other Ambulatory Visit: Payer: Self-pay | Admitting: Hematology & Oncology

## 2016-01-23 ENCOUNTER — Ambulatory Visit: Payer: Medicare Other | Admitting: Hematology & Oncology

## 2016-01-23 ENCOUNTER — Other Ambulatory Visit: Payer: Medicare Other

## 2016-01-23 ENCOUNTER — Ambulatory Visit: Payer: Medicare Other

## 2016-01-26 ENCOUNTER — Ambulatory Visit (HOSPITAL_BASED_OUTPATIENT_CLINIC_OR_DEPARTMENT_OTHER): Payer: Medicare Other | Admitting: Hematology & Oncology

## 2016-01-26 ENCOUNTER — Other Ambulatory Visit (HOSPITAL_BASED_OUTPATIENT_CLINIC_OR_DEPARTMENT_OTHER): Payer: Medicare Other

## 2016-01-26 ENCOUNTER — Ambulatory Visit (HOSPITAL_BASED_OUTPATIENT_CLINIC_OR_DEPARTMENT_OTHER): Payer: Medicare Other

## 2016-01-26 VITALS — BP 201/84 | HR 55 | Temp 98.5°F | Resp 20 | Wt 202.0 lb

## 2016-01-26 VITALS — BP 137/58 | HR 55 | Temp 98.0°F | Resp 20

## 2016-01-26 DIAGNOSIS — Z5112 Encounter for antineoplastic immunotherapy: Secondary | ICD-10-CM

## 2016-01-26 DIAGNOSIS — E083399 Diabetes mellitus due to underlying condition with moderate nonproliferative diabetic retinopathy without macular edema, unspecified eye: Secondary | ICD-10-CM

## 2016-01-26 DIAGNOSIS — C9110 Chronic lymphocytic leukemia of B-cell type not having achieved remission: Secondary | ICD-10-CM

## 2016-01-26 DIAGNOSIS — D509 Iron deficiency anemia, unspecified: Secondary | ICD-10-CM

## 2016-01-26 LAB — MANUAL DIFFERENTIAL (CHCC SATELLITE)
ALC: 1.2 10*3/uL (ref 0.6–2.2)
ANC (CHCC HP manual diff): 2.4 10*3/uL (ref 1.5–6.7)
BASO: 2 % (ref 0–2)
Band Neutrophils: 2 % (ref 0–10)
LYMPH: 28 % (ref 14–48)
MONO: 11 % (ref 0–13)
PLT EST ~~LOC~~: ADEQUATE
Platelet Morphology: NORMAL
SEG: 57 % (ref 40–75)

## 2016-01-26 LAB — CBC WITH DIFFERENTIAL (CANCER CENTER ONLY)
HCT: 28.4 % — ABNORMAL LOW (ref 34.8–46.6)
HGB: 9.7 g/dL — ABNORMAL LOW (ref 11.6–15.9)
MCH: 25.6 pg — ABNORMAL LOW (ref 26.0–34.0)
MCHC: 34.2 g/dL (ref 32.0–36.0)
MCV: 75 fL — AB (ref 81–101)
PLATELETS: 197 10*3/uL (ref 145–400)
RBC: 3.79 10*6/uL (ref 3.70–5.32)
RDW: 15.5 % (ref 11.1–15.7)
WBC: 4.1 10*3/uL (ref 3.9–10.0)

## 2016-01-26 LAB — CMP (CANCER CENTER ONLY)
ALT: 17 U/L (ref 10–47)
AST: 24 U/L (ref 11–38)
Albumin: 3.5 g/dL (ref 3.3–5.5)
Alkaline Phosphatase: 83 U/L (ref 26–84)
BUN: 14 mg/dL (ref 7–22)
CO2: 30 mEq/L (ref 18–33)
Calcium: 10.7 mg/dL — ABNORMAL HIGH (ref 8.0–10.3)
Chloride: 103 mEq/L (ref 98–108)
Creat: 1.2 mg/dl (ref 0.6–1.2)
GLUCOSE: 174 mg/dL — AB (ref 73–118)
POTASSIUM: 4.1 meq/L (ref 3.3–4.7)
SODIUM: 138 meq/L (ref 128–145)
Total Bilirubin: 0.8 mg/dl (ref 0.20–1.60)
Total Protein: 5.9 g/dL — ABNORMAL LOW (ref 6.4–8.1)

## 2016-01-26 LAB — CHCC SATELLITE - SMEAR

## 2016-01-26 MED ORDER — IBUPROFEN 200 MG PO TABS
400.0000 mg | ORAL_TABLET | Freq: Once | ORAL | Status: AC
Start: 2016-01-26 — End: 2016-01-26
  Administered 2016-01-26: 400 mg via ORAL

## 2016-01-26 MED ORDER — SODIUM CHLORIDE 0.9 % IV SOLN
Freq: Once | INTRAVENOUS | Status: AC
  Administered 2016-01-26: 10:00:00 via INTRAVENOUS

## 2016-01-26 MED ORDER — IBUPROFEN 200 MG PO TABS
ORAL_TABLET | ORAL | Status: AC
  Filled 2016-01-26: qty 2

## 2016-01-26 MED ORDER — DIPHENHYDRAMINE HCL 50 MG/ML IJ SOLN
INTRAMUSCULAR | Status: AC
  Filled 2016-01-26: qty 1

## 2016-01-26 MED ORDER — HEPARIN SOD (PORK) LOCK FLUSH 100 UNIT/ML IV SOLN
500.0000 [IU] | Freq: Once | INTRAVENOUS | Status: AC | PRN
  Administered 2016-01-26: 500 [IU]
  Filled 2016-01-26: qty 5

## 2016-01-26 MED ORDER — FAMOTIDINE IN NACL 20-0.9 MG/50ML-% IV SOLN
INTRAVENOUS | Status: AC
  Filled 2016-01-26: qty 50

## 2016-01-26 MED ORDER — FAMOTIDINE IN NACL 20-0.9 MG/50ML-% IV SOLN
40.0000 mg | Freq: Once | INTRAVENOUS | Status: AC
  Administered 2016-01-26: 40 mg via INTRAVENOUS

## 2016-01-26 MED ORDER — DIPHENHYDRAMINE HCL 50 MG/ML IJ SOLN
50.0000 mg | Freq: Once | INTRAMUSCULAR | Status: AC
  Administered 2016-01-26: 50 mg via INTRAVENOUS

## 2016-01-26 MED ORDER — SODIUM CHLORIDE 0.9 % IV SOLN
1000.0000 mg | Freq: Once | INTRAVENOUS | Status: AC
  Administered 2016-01-26: 1000 mg via INTRAVENOUS
  Filled 2016-01-26: qty 40

## 2016-01-26 MED ORDER — SODIUM CHLORIDE 0.9 % IJ SOLN
10.0000 mL | INTRAMUSCULAR | Status: DC | PRN
  Administered 2016-01-26: 10 mL
  Filled 2016-01-26: qty 10

## 2016-01-26 NOTE — Patient Instructions (Signed)
Obinutuzumab injection What is this medicine? OBINUTUZUMAB (OH bi nue TOOZ ue mab) is a monoclonal antibody. It is used to treat chronic lymphocytic leukemia (CLL) and a type of non-Hodgkin lymphoma (NHL), follicular lymphoma. This medicine may be used for other purposes; ask your health care provider or pharmacist if you have questions. What should I tell my health care provider before I take this medicine? They need to know if you have any of these conditions: -infection (especially a virus infection such as hepatitis B virus) -lung or breathing disease -heart disease -take medicines that treat or prevent blood clots -an unusual or allergic reaction to obinutuzumab, other medicines, foods, dyes, or preservatives -pregnant or trying to get pregnant -breast-feeding How should I use this medicine? This medicine is for infusion into a vein. It is given by a health care professional in a hospital or clinic setting. Talk to your pediatrician regarding the use of this medicine in children. Special care may be needed. Overdosage: If you think you have taken too much of this medicine contact a poison control center or emergency room at once. NOTE: This medicine is only for you. Do not share this medicine with others. What if I miss a dose? Keep appointments for follow-up doses as directed. It is important not to miss your dose. Call your doctor or health care professional if you are unable to keep an appointment. What may interact with this medicine? -live virus vaccines This list may not describe all possible interactions. Give your health care provider a list of all the medicines, herbs, non-prescription drugs, or dietary supplements you use. Also tell them if you smoke, drink alcohol, or use illegal drugs. Some items may interact with your medicine. What should I watch for while using this medicine? Report any side effects that you notice during your treatment right away, such as changes in your  breathing, fever, chills, dizziness or lightheadedness. These effects are more common with the first dose. Visit your prescriber or health care professional for checks on your progress. You will need to have regular blood work. Report any other side effects. The side effects of this medicine can continue after you finish your treatment. Continue your course of treatment even though you feel ill unless your doctor tells you to stop. Call your doctor or health care professional for advice if you get a fever, chills or sore throat, or other symptoms of a cold or flu. Do not treat yourself. This drug decreases your body's ability to fight infections. Try to avoid being around people who are sick. This medicine may increase your risk to bruise or bleed. Call your doctor or health care professional if you notice any unusual bleeding. What side effects may I notice from receiving this medicine? Side effects that you should report to your doctor or health care professional as soon as possible: -allergic reactions like skin rash, itching or hives, swelling of the face, lips, or tongue -breathing problems -changes in vision -chest pain or chest tightness -confusion -dizziness -loss of balance or coordination -low blood counts - this medicine may decrease the number of white blood cells, red blood cells and platelets. You may be at increased risk for infections and bleeding. -signs of decreased platelets or bleeding - bruising, pinpoint red spots on the skin, black, tarry stools, blood in the urine -signs of infection - fever or chills, cough, sore throat, pain or trouble passing urine -signs and symptoms of liver injury like dark yellow or brown urine; general ill   feeling or flu-like symptoms; light-colored stools; loss of appetite; nausea; right upper belly pain; unusually weak or tired; yellowing of the eyes or skin -trouble speaking or understanding -trouble walking -vomiting Side effects that usually  do not require medical attention (Report these to your doctor or health care professional if they continue or are bothersome.): -constipation -joint pain -muscle pain This list may not describe all possible side effects. Call your doctor for medical advice about side effects. You may report side effects to FDA at 1-800-FDA-1088. Where should I keep my medicine? This drug is only given in a hospital or clinic and will not be stored at home. NOTE: This sheet is a summary. It may not cover all possible information. If you have questions about this medicine, talk to your doctor, pharmacist, or health care provider.    2016, [employer] Standard. (2014-12-03 09:58:07)  

## 2016-01-26 NOTE — Progress Notes (Signed)
Patient blood pressure 205/93.  Dr. Marin Olp notified.  Ok to proceed with chemotherapy.  Discussed with patient the importance of going to her primary care physician to attend to her high blood pressure.  Patient agrees

## 2016-01-26 NOTE — Progress Notes (Signed)
Hematology and Oncology Follow Up Visit  Megan Tate [id number] 10/21/1945 69 y.o. 01/26/2016   Principle Diagnosis:  Chronic lymphocytic leukemia- Trisomy 12  Current Therapy:   Status post 6 cycles of Gazyva/Bendamustine - completed4/2016 Maintenance Gazyva every 2 months s/p cycle 5    Interim History:  Megan Tate is here today with Megan Tate husband for a follow-up and treatment.  Megan Tate looks great. Megan Tate feels well. He had a very nice resurrection Sunday. 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  Megan Tate blood sugars are doing okay. Megan Tate still has blood pressure problems. Megan Tate family doctor is trying help out with this.  Megan Tate's had no infection issues. Megan Tate's had no nodules. Megan Tate's had no rashes. Megan Tate's had no leg swelling. Megan Tate's had no cough or shortness of breath.  Possibly, they smoked, Megan Tate had husband will be going on vacation.    Overall, Megan Tate performance status is ECOG 1.   Medications:    Medication List       This list is accurate as of: 01/26/16  9:23 AM.  Always use your most recent med list.               bisacodyl 5 MG EC tablet  Commonly known as:  DULCOLAX  Take 1 tablet (5 mg total) by mouth 2 (two) times daily as needed for moderate constipation.     cloNIDine 0.2 MG tablet  Commonly known as:  CATAPRES  Take 0.2 mg by mouth 2 (two) times daily.     dorzolamide-timolol 22.3-6.8 MG/ML ophthalmic solution  Commonly known as:  COSOPT  Place 1 drop into both eyes 2 (two) times daily.     EDARBI 40 MG Tabs  Generic drug:  Azilsartan Medoxomil  Take 1 tablet by mouth daily. Reported on 01/26/2016     esomeprazole 20 MG capsule  Commonly known as:  NEXIUM  Take 1 capsule (20 mg total) by mouth 2 (two) times daily before a meal.     fluconazole 200 MG tablet  Commonly known as:  DIFLUCAN  Take 1 tablet (200 mg total) by mouth daily.     glucose blood test strip  Monitor blood glucose daily before every meal and at bedtime     JANUMET XR 50-1000 MG Tb24    Generic drug:  SitaGLIPtin-MetFORMIN HCl  Take 2 tablets by mouth daily.     LEVEMIR FLEXTOUCH 100 UNIT/ML Pen  Generic drug:  Insulin Detemir  Inject 10 Units into the skin every morning.     lidocaine-prilocaine cream  Commonly known as:  EMLA  Apply to Port-A-Cath site 1 hour ride to treatment     losartan 100 MG tablet  Commonly known as:  COZAAR  Take 100 mg by mouth daily.     metoCLOPramide 10 MG tablet  Commonly known as:  REGLAN  Take 1 tablet (10 mg total) by mouth 3 (three) times daily before meals.     metoprolol succinate 100 MG 24 hr tablet  Commonly known as:  TOPROL-XL  Take 100 mg by mouth daily with lunch.     ONE TOUCH ULTRA SYSTEM KIT w/Device Kit  1 kit by Does not apply route once. Check blood sugar daily before meals and at bedtime.     orphenadrine 100 MG tablet  Commonly known as:  NORFLEX  TAKE 1 TABLET (100 MG TOTAL) BY MOUTH 2 (TWO) TIMES DAILY AS NEEDED FOR MUSCLE SPASMS.     prochlorperazine 10 MG tablet  Commonly known as:  COMPAZINE    Take 1 tablet (10 mg total) by mouth every 6 (six) hours as needed (Nausea or vomiting).     rosuvastatin 20 MG tablet  Commonly known as:  CRESTOR  Take 20 mg by mouth daily.     spironolactone 25 MG tablet  Commonly known as:  ALDACTONE  TAKE ONE TABLET BY MOUTH ONE TIME DAILY        Allergies:  Allergies  Allergen Reactions  . Tylenol [Acetaminophen] Other (See Comments)    Messes with stomach    Past Medical History, Surgical history, Social history, and Family History were reviewed and updated.  Review of Systems: All other 10 point review of systems is negative.   Physical Exam:  weight is 202 lb (91.627 kg). Megan Tate oral temperature is 98.5 F (36.9 C). Megan Tate blood pressure is 201/84 and Megan Tate pulse is 55. Megan Tate respiration is 20.   Wt Readings from Last 3 Encounters:  01/26/16 202 lb (91.627 kg)  11/23/15 199 lb (90.266 kg)  09/23/15 195 lb (88.451 kg)     well-developed and well-nourished  African-American female in no obvious distress. Head and neck exam shows no ocular or oral lesions. Megan Tate has no adenopathy in the neck. Lungs are clear. Cardiac exam regular rate and rhythm with no murmurs, rubs or bruits. Abdomen is soft. Megan Tate has no fluid wave. There is no palpable liver or spleen tip. Axillary exam shows no bilateral axillary adenopathy. Back exam shows no tenderness over the spine, ribs or hips. Extremities shows no clubbing, cyanosis or edema. Neurological exam shows no focal neurological deficits. Skin exam shows no rashes, ecchymoses or petechia. Lab Results  Component Value Date   WBC 4.1 01/26/2016   HGB 9.7* 01/26/2016   HCT 28.4* 01/26/2016   MCV 75* 01/26/2016   PLT 197 01/26/2016   No results found for: FERRITIN, IRON, TIBC, UIBC, IRONPCTSAT Lab Results  Component Value Date   RETICCTPCT 1.2 04/07/2014   RBC 3.79 01/26/2016   RETICCTABS 65.4 04/07/2014   No results found for: KPAFRELGTCHN, LAMBDASER, KAPLAMBRATIO Lab Results  Component Value Date   IGGSERUM 545* 03/30/2015   IGA 41* 03/30/2015   IGMSERUM <5* 03/30/2015   Lab Results  Component Value Date   TOTALPROTELP 5.5* 03/30/2015   ALBUMINELP 3.4* 03/30/2015   A1GS 0.3 03/30/2015   A2GS 0.8 03/30/2015   BETS 0.4 03/30/2015   BETA2SER 0.2 03/30/2015   GAMS 0.5* 03/30/2015   MSPIKE NOT DET 08/16/2014   SPEI * 03/30/2015     Chemistry      Component Value Date/Time   NA 138 01/26/2016 0815   NA 136 11/16/2014 1209   K 4.1 01/26/2016 0815   K 3.2* 11/16/2014 1209   CL 103 01/26/2016 0815   CL 101 11/16/2014 1209   CO2 30 01/26/2016 0815   CO2 28 11/16/2014 1209   BUN 14 01/26/2016 0815   BUN 7 11/16/2014 1209   CREATININE 1.2 01/26/2016 0815   CREATININE 0.75 11/16/2014 1209      Component Value Date/Time   CALCIUM 10.7* 01/26/2016 0815   CALCIUM 9.7 11/16/2014 1209   ALKPHOS 83 01/26/2016 0815   ALKPHOS 52 11/16/2014 1209   AST 24 01/26/2016 0815   AST 11 11/16/2014 1209   ALT 17  01/26/2016 0815   ALT <8 11/16/2014 1209   BILITOT 0.80 01/26/2016 0815   BILITOT 0.4 11/16/2014 1209     Impression and Plan: Megan Tate is 69-year-old African-American female with chronic lymphocytic leukemia. Megan Tate completed treatment with   Gazyva and Bendamustine in April and is now on Maintenance Gazyva.    I'm glad that Megan Tate is doing so well right now. I'm little troubled by Megan Tate anemia. I think that Megan Tate may have some iron deficiency. I know that we have tried to get Megan Tate to get a colonoscopy. We will check Megan Tate iron studies. If Megan Tate is clearly iron deficient, then we are going to have to see about a colonoscopy for Megan Tate.  Megan Tate is asymptomatic with the anemia.  We will go ahead and plan to get Megan Tate back to see us in another 2 months. Megan Tate clearly is in a hematologic remission. Megan Tate blood counts look great. I would get Megan Tate blood smear under the microscope and did not see any atypical lymphocytes.   We will plan to get Megan Tate back in another 2 months. We will continue the maintenance Gazyva for 2 years.   ENNEVER,PETER R, MD 4/27/20179:23 AM   

## 2016-02-04 DIAGNOSIS — H40053 Ocular hypertension, bilateral: Secondary | ICD-10-CM | POA: Insufficient documentation

## 2016-02-04 DIAGNOSIS — R599 Enlarged lymph nodes, unspecified: Secondary | ICD-10-CM | POA: Insufficient documentation

## 2016-02-04 DIAGNOSIS — I10 Essential (primary) hypertension: Secondary | ICD-10-CM | POA: Insufficient documentation

## 2016-02-04 DIAGNOSIS — E119 Type 2 diabetes mellitus without complications: Secondary | ICD-10-CM | POA: Insufficient documentation

## 2016-02-04 DIAGNOSIS — M109 Gout, unspecified: Secondary | ICD-10-CM | POA: Insufficient documentation

## 2016-02-04 DIAGNOSIS — Z794 Long term (current) use of insulin: Secondary | ICD-10-CM

## 2016-03-26 ENCOUNTER — Ambulatory Visit (HOSPITAL_BASED_OUTPATIENT_CLINIC_OR_DEPARTMENT_OTHER): Payer: Medicare Other | Admitting: Hematology & Oncology

## 2016-03-26 ENCOUNTER — Other Ambulatory Visit (HOSPITAL_BASED_OUTPATIENT_CLINIC_OR_DEPARTMENT_OTHER): Payer: Medicare Other

## 2016-03-26 ENCOUNTER — Ambulatory Visit (HOSPITAL_BASED_OUTPATIENT_CLINIC_OR_DEPARTMENT_OTHER): Payer: Medicare Other

## 2016-03-26 VITALS — BP 162/68 | HR 66 | Temp 98.6°F | Resp 18 | Wt 199.0 lb

## 2016-03-26 VITALS — BP 166/76 | HR 51 | Temp 97.9°F | Resp 20

## 2016-03-26 DIAGNOSIS — D509 Iron deficiency anemia, unspecified: Secondary | ICD-10-CM

## 2016-03-26 DIAGNOSIS — C9110 Chronic lymphocytic leukemia of B-cell type not having achieved remission: Secondary | ICD-10-CM

## 2016-03-26 DIAGNOSIS — Z5112 Encounter for antineoplastic immunotherapy: Secondary | ICD-10-CM | POA: Diagnosis present

## 2016-03-26 DIAGNOSIS — E083399 Diabetes mellitus due to underlying condition with moderate nonproliferative diabetic retinopathy without macular edema, unspecified eye: Secondary | ICD-10-CM

## 2016-03-26 LAB — CMP (CANCER CENTER ONLY)
ALK PHOS: 76 U/L (ref 26–84)
ALT: 12 U/L (ref 10–47)
AST: 23 U/L (ref 11–38)
Albumin: 3.9 g/dL (ref 3.3–5.5)
BUN: 16 mg/dL (ref 7–22)
CO2: 28 mEq/L (ref 18–33)
CREATININE: 1.4 mg/dL — AB (ref 0.6–1.2)
Calcium: 10.9 mg/dL — ABNORMAL HIGH (ref 8.0–10.3)
Chloride: 101 mEq/L (ref 98–108)
GLUCOSE: 158 mg/dL — AB (ref 73–118)
POTASSIUM: 4 meq/L (ref 3.3–4.7)
SODIUM: 136 meq/L (ref 128–145)
TOTAL PROTEIN: 6 g/dL — AB (ref 6.4–8.1)
Total Bilirubin: 0.6 mg/dl (ref 0.20–1.60)

## 2016-03-26 LAB — MANUAL DIFFERENTIAL (CHCC SATELLITE)
ALC: 1.3 10*3/uL (ref 0.6–2.2)
ANC (CHCC HP manual diff): 2.3 10*3/uL (ref 1.5–6.7)
BAND NEUTROPHILS: 3 % (ref 0–10)
Eos: 1 % (ref 0–7)
LYMPH: 33 % (ref 14–48)
METAMYELOCYTES PCT: 1 % — AB (ref 0–0)
MONO: 8 % (ref 0–13)
PLT EST ~~LOC~~: ADEQUATE
SEG: 54 % (ref 40–75)

## 2016-03-26 LAB — IRON AND TIBC
%SAT: 29 % (ref 21–57)
Iron: 80 ug/dL (ref 41–142)
TIBC: 273 ug/dL (ref 236–444)
UIBC: 193 ug/dL (ref 120–384)

## 2016-03-26 LAB — CBC WITH DIFFERENTIAL (CANCER CENTER ONLY)
HCT: 30.5 % — ABNORMAL LOW (ref 34.8–46.6)
HGB: 10.4 g/dL — ABNORMAL LOW (ref 11.6–15.9)
MCH: 25.9 pg — ABNORMAL LOW (ref 26.0–34.0)
MCHC: 34.1 g/dL (ref 32.0–36.0)
MCV: 76 fL — AB (ref 81–101)
PLATELETS: 153 10*3/uL (ref 145–400)
RBC: 4.01 10*6/uL (ref 3.70–5.32)
RDW: 14.6 % (ref 11.1–15.7)
WBC: 4 10*3/uL (ref 3.9–10.0)

## 2016-03-26 LAB — FERRITIN: Ferritin: 242 ng/mL (ref 9–269)

## 2016-03-26 MED ORDER — IBUPROFEN 200 MG PO TABS
ORAL_TABLET | ORAL | Status: AC
  Filled 2016-03-26: qty 2

## 2016-03-26 MED ORDER — SODIUM CHLORIDE 0.9 % IJ SOLN
10.0000 mL | INTRAMUSCULAR | Status: DC | PRN
  Administered 2016-03-26: 10 mL
  Filled 2016-03-26: qty 10

## 2016-03-26 MED ORDER — FAMOTIDINE IN NACL 20-0.9 MG/50ML-% IV SOLN
40.0000 mg | Freq: Once | INTRAVENOUS | Status: AC
  Administered 2016-03-26: 40 mg via INTRAVENOUS

## 2016-03-26 MED ORDER — FAMOTIDINE IN NACL 20-0.9 MG/50ML-% IV SOLN
INTRAVENOUS | Status: AC
  Filled 2016-03-26: qty 50

## 2016-03-26 MED ORDER — DIPHENHYDRAMINE HCL 50 MG/ML IJ SOLN
INTRAMUSCULAR | Status: AC
  Filled 2016-03-26: qty 1

## 2016-03-26 MED ORDER — DIPHENHYDRAMINE HCL 50 MG/ML IJ SOLN
50.0000 mg | Freq: Once | INTRAMUSCULAR | Status: AC
  Administered 2016-03-26: 50 mg via INTRAVENOUS

## 2016-03-26 MED ORDER — HEPARIN SOD (PORK) LOCK FLUSH 100 UNIT/ML IV SOLN
500.0000 [IU] | Freq: Once | INTRAVENOUS | Status: AC | PRN
  Administered 2016-03-26: 500 [IU]
  Filled 2016-03-26: qty 5

## 2016-03-26 MED ORDER — IBUPROFEN 200 MG PO TABS
400.0000 mg | ORAL_TABLET | Freq: Once | ORAL | Status: AC
  Administered 2016-03-26: 400 mg via ORAL

## 2016-03-26 MED ORDER — SODIUM CHLORIDE 0.9 % IV SOLN
Freq: Once | INTRAVENOUS | Status: AC
  Administered 2016-03-26: 10:00:00 via INTRAVENOUS

## 2016-03-26 MED ORDER — SODIUM CHLORIDE 0.9 % IV SOLN
1000.0000 mg | Freq: Once | INTRAVENOUS | Status: AC
  Administered 2016-03-26: 1000 mg via INTRAVENOUS
  Filled 2016-03-26: qty 40

## 2016-03-26 NOTE — Patient Instructions (Signed)
Obinutuzumab injection What is this medicine? OBINUTUZUMAB (OH bi nue TOOZ ue mab) is a monoclonal antibody. It is used to treat chronic lymphocytic leukemia (CLL) and a type of non-Hodgkin lymphoma (NHL), follicular lymphoma. This medicine may be used for other purposes; ask your health care provider or pharmacist if you have questions. What should I tell my health care provider before I take this medicine? They need to know if you have any of these conditions: -infection (especially a virus infection such as hepatitis B virus) -lung or breathing disease -heart disease -take medicines that treat or prevent blood clots -an unusual or allergic reaction to obinutuzumab, other medicines, foods, dyes, or preservatives -pregnant or trying to get pregnant -breast-feeding How should I use this medicine? This medicine is for infusion into a vein. It is given by a health care professional in a hospital or clinic setting. Talk to your pediatrician regarding the use of this medicine in children. Special care may be needed. Overdosage: If you think you have taken too much of this medicine contact a poison control center or emergency room at once. NOTE: This medicine is only for you. Do not share this medicine with others. What if I miss a dose? Keep appointments for follow-up doses as directed. It is important not to miss your dose. Call your doctor or health care professional if you are unable to keep an appointment. What may interact with this medicine? -live virus vaccines This list may not describe all possible interactions. Give your health care provider a list of all the medicines, herbs, non-prescription drugs, or dietary supplements you use. Also tell them if you smoke, drink alcohol, or use illegal drugs. Some items may interact with your medicine. What should I watch for while using this medicine? Report any side effects that you notice during your treatment right away, such as changes in your  breathing, fever, chills, dizziness or lightheadedness. These effects are more common with the first dose. Visit your prescriber or health care professional for checks on your progress. You will need to have regular blood work. Report any other side effects. The side effects of this medicine can continue after you finish your treatment. Continue your course of treatment even though you feel ill unless your doctor tells you to stop. Call your doctor or health care professional for advice if you get a fever, chills or sore throat, or other symptoms of a cold or flu. Do not treat yourself. This drug decreases your body's ability to fight infections. Try to avoid being around people who are sick. This medicine may increase your risk to bruise or bleed. Call your doctor or health care professional if you notice any unusual bleeding. What side effects may I notice from receiving this medicine? Side effects that you should report to your doctor or health care professional as soon as possible: -allergic reactions like skin rash, itching or hives, swelling of the face, lips, or tongue -breathing problems -changes in vision -chest pain or chest tightness -confusion -dizziness -loss of balance or coordination -low blood counts - this medicine may decrease the number of white blood cells, red blood cells and platelets. You may be at increased risk for infections and bleeding. -signs of decreased platelets or bleeding - bruising, pinpoint red spots on the skin, black, tarry stools, blood in the urine -signs of infection - fever or chills, cough, sore throat, pain or trouble passing urine -signs and symptoms of liver injury like dark yellow or brown urine; general ill   feeling or flu-like symptoms; light-colored stools; loss of appetite; nausea; right upper belly pain; unusually weak or tired; yellowing of the eyes or skin -trouble speaking or understanding -trouble walking -vomiting Side effects that usually  do not require medical attention (Report these to your doctor or health care professional if they continue or are bothersome.): -constipation -joint pain -muscle pain This list may not describe all possible side effects. Call your doctor for medical advice about side effects. You may report side effects to FDA at 1-800-FDA-1088. Where should I keep my medicine? This drug is only given in a hospital or clinic and will not be stored at home. NOTE: This sheet is a summary. It may not cover all possible information. If you have questions about this medicine, talk to your doctor, pharmacist, or health care provider.    2016, [employer] Standard. (2014-12-03 09:58:07)  

## 2016-03-26 NOTE — Progress Notes (Signed)
Hematology and Oncology Follow Up Visit  Megan Tate [id number] 1946/07/11 70 y.o. 03/26/2016   Principle Diagnosis:  Chronic lymphocytic leukemia- Trisomy 12  Current Therapy:   Status post 6 cycles of Gazyva/Bendamustine - completed4/2016 Maintenance Gazyva every 2 months s/p cycle 6    Interim History:  Megan Tate is here today with her husband for a follow-up and treatment.  She looks great. She feels well. She has been helping her husband out with his congregation a church.  She is watching her blood sugars closely. She's watch what she eats.  She's had no fever. She's had no swollen lymph nodes. She's had no abdominal pain. There has been no change in bowel or bladder habits. She has had no rashes. She's had no bleeding or bruising. .    Overall, her performance status is ECOG 1.   Medications:    Medication List       This list is accurate as of: 03/26/16 10:30 AM.  Always use your most recent med list.               cloNIDine 0.2 MG tablet  Commonly known as:  CATAPRES  Take 0.2 mg by mouth 2 (two) times daily.     dorzolamide-timolol 22.3-6.8 MG/ML ophthalmic solution  Commonly known as:  COSOPT  Place 1 drop into both eyes 2 (two) times daily.     esomeprazole 20 MG capsule  Commonly known as:  NEXIUM  Take 1 capsule (20 mg total) by mouth 2 (two) times daily before a meal.     fluconazole 200 MG tablet  Commonly known as:  DIFLUCAN  Take 1 tablet (200 mg total) by mouth daily.     glucose blood test strip  Monitor blood glucose daily before every meal and at bedtime     JANUMET XR 50-1000 MG Tb24  Generic drug:  SitaGLIPtin-MetFORMIN HCl  Take 2 tablets by mouth daily.     LEVEMIR FLEXTOUCH 100 UNIT/ML Pen  Generic drug:  Insulin Detemir  Inject 10 Units into the skin every morning.     lidocaine-prilocaine cream  Commonly known as:  EMLA  Apply to Port-A-Cath site 1 hour ride to treatment     losartan 100 MG tablet  Commonly known as:   COZAAR  Take 100 mg by mouth daily.     metoCLOPramide 10 MG tablet  Commonly known as:  REGLAN  Take 1 tablet (10 mg total) by mouth 3 (three) times daily before meals.     metoprolol succinate 100 MG 24 hr tablet  Commonly known as:  TOPROL-XL  Take 100 mg by mouth daily with lunch.     ONE TOUCH ULTRA SYSTEM KIT w/Device Kit  1 kit by Does not apply route once. Check blood sugar daily before meals and at bedtime.     orphenadrine 100 MG tablet  Commonly known as:  NORFLEX  TAKE 1 TABLET (100 MG TOTAL) BY MOUTH 2 (TWO) TIMES DAILY AS NEEDED FOR MUSCLE SPASMS.     prochlorperazine 10 MG tablet  Commonly known as:  COMPAZINE  Take 1 tablet (10 mg total) by mouth every 6 (six) hours as needed (Nausea or vomiting).     rosuvastatin 20 MG tablet  Commonly known as:  CRESTOR  Take 20 mg by mouth daily.     spironolactone 25 MG tablet  Commonly known as:  ALDACTONE  TAKE ONE TABLET BY MOUTH ONE TIME DAILY        Allergies:  Allergies  Allergen Reactions  . Tylenol [Acetaminophen] Other (See Comments)    Messes with stomach    Past Medical History, Surgical history, Social history, and Family History were reviewed and updated.  Review of Systems: All other 10 point review of systems is negative.   Physical Exam:  weight is 199 lb (90.266 kg). Her oral temperature is 98.6 F (37 C). Her blood pressure is 162/68 and her pulse is 66. Her respiration is 18 and oxygen saturation is 98%.   Wt Readings from Last 3 Encounters:  03/26/16 199 lb (90.266 kg)  01/26/16 202 lb (91.627 kg)  11/23/15 199 lb (90.266 kg)     well-developed and well-nourished African-American female in no obvious distress. Head and neck exam shows no ocular or oral lesions. She has no adenopathy in the neck. Lungs are clear. Cardiac exam regular rate and rhythm with no murmurs, rubs or bruits. Abdomen is soft. She has no fluid wave. There is no palpable liver or spleen tip. Axillary exam shows no  bilateral axillary adenopathy. Back exam shows no tenderness over the spine, ribs or hips. Extremities shows no clubbing, cyanosis or edema. Neurological exam shows no focal neurological deficits. Skin exam shows no rashes, ecchymoses or petechia. Lab Results  Component Value Date   WBC 4.0 03/26/2016   HGB 10.4* 03/26/2016   HCT 30.5* 03/26/2016   MCV 76* 03/26/2016   PLT 153 03/26/2016   No results found for: FERRITIN, IRON, TIBC, UIBC, IRONPCTSAT Lab Results  Component Value Date   RETICCTPCT 1.2 04/07/2014   RBC 4.01 03/26/2016   RETICCTABS 65.4 04/07/2014   No results found for: [facility] Lab Results  Component Value Date   IGGSERUM 545* 03/30/2015   IGA 41* 03/30/2015   IGMSERUM <5* 03/30/2015   Lab Results  Component Value Date   TOTALPROTELP 5.5* 03/30/2015   ALBUMINELP 3.4* 03/30/2015   A1GS 0.3 03/30/2015   A2GS 0.8 03/30/2015   BETS 0.4 03/30/2015   BETA2SER 0.2 03/30/2015   GAMS 0.5* 03/30/2015   MSPIKE NOT DET 08/16/2014   SPEI * 03/30/2015     Chemistry      Component Value Date/Time   NA 136 03/26/2016 0914   NA 136 11/16/2014 1209   K 4.0 03/26/2016 0914   K 3.2* 11/16/2014 1209   CL 101 03/26/2016 0914   CL 101 11/16/2014 1209   CO2 28 03/26/2016 0914   CO2 28 11/16/2014 1209   BUN 16 03/26/2016 0914   BUN 7 11/16/2014 1209   CREATININE 1.4* 03/26/2016 0914   CREATININE 0.75 11/16/2014 1209      Component Value Date/Time   CALCIUM 10.9* 03/26/2016 0914   CALCIUM 9.7 11/16/2014 1209   ALKPHOS 76 03/26/2016 0914   ALKPHOS 52 11/16/2014 1209   AST 23 03/26/2016 0914   AST 11 11/16/2014 1209   ALT 12 03/26/2016 0914   ALT <8 11/16/2014 1209   BILITOT 0.60 03/26/2016 0914   BILITOT 0.4 11/16/2014 1209     Impression and Plan: Megan Tate is 70 year old African-American female with chronic lymphocytic leukemia. She completed treatment with Gazyva and Bendamustine in April and is now on Maintenance Gazyva.    I'm  glad that she is doing so well right now.  Her hemoglobin is doing a little bit better. She still has some microcytic changes. She still may be iron deficient.  She's had one year of maintenance Gazyva. We have one additional year to go.   I will plan to see  her back in 2 more months.  Volanda Napoleon, MD 6/26/201710:30 AM

## 2016-03-27 LAB — RETICULOCYTES: Reticulocyte Count: 0.8 % (ref 0.6–2.6)

## 2016-03-28 LAB — HEMOGLOBINOPATHY EVALUATION
HEMOGLOBIN A2 QUANTITATION: 1.3 % (ref 0.7–3.1)
HEMOGLOBIN F QUANTITATION: 22.4 % — AB (ref 0.0–2.0)
HGB A: 76.3 % — AB (ref 94.0–98.0)
HGB C: 0 %
HGB S: 0 %

## 2016-04-04 LAB — ALPHA-THALASSEMIA GENOTYPR: PDF: 0

## 2016-05-24 ENCOUNTER — Other Ambulatory Visit: Payer: Self-pay

## 2016-05-24 DIAGNOSIS — C9110 Chronic lymphocytic leukemia of B-cell type not having achieved remission: Secondary | ICD-10-CM

## 2016-05-24 MED ORDER — LIDOCAINE-PRILOCAINE 2.5-2.5 % EX CREA: TOPICAL_CREAM | CUTANEOUS | 4 refills | Status: DC

## 2016-05-25 ENCOUNTER — Ambulatory Visit (HOSPITAL_BASED_OUTPATIENT_CLINIC_OR_DEPARTMENT_OTHER): Payer: Medicare Other

## 2016-05-25 ENCOUNTER — Other Ambulatory Visit: Payer: Medicare Other

## 2016-05-25 ENCOUNTER — Other Ambulatory Visit (HOSPITAL_BASED_OUTPATIENT_CLINIC_OR_DEPARTMENT_OTHER): Payer: Medicare Other

## 2016-05-25 ENCOUNTER — Ambulatory Visit: Payer: Medicare Other

## 2016-05-25 ENCOUNTER — Ambulatory Visit: Payer: Medicare Other | Admitting: Family

## 2016-05-25 ENCOUNTER — Ambulatory Visit (HOSPITAL_BASED_OUTPATIENT_CLINIC_OR_DEPARTMENT_OTHER): Payer: Medicare Other | Admitting: Family

## 2016-05-25 ENCOUNTER — Encounter: Payer: Self-pay | Admitting: Family

## 2016-05-25 VITALS — BP 157/61 | HR 58 | Temp 98.5°F | Resp 16 | Ht 64.0 in | Wt 196.1 lb

## 2016-05-25 VITALS — BP 177/72 | HR 43 | Temp 97.9°F | Resp 16

## 2016-05-25 DIAGNOSIS — C9110 Chronic lymphocytic leukemia of B-cell type not having achieved remission: Secondary | ICD-10-CM

## 2016-05-25 DIAGNOSIS — D509 Iron deficiency anemia, unspecified: Secondary | ICD-10-CM | POA: Diagnosis not present

## 2016-05-25 DIAGNOSIS — Z5112 Encounter for antineoplastic immunotherapy: Secondary | ICD-10-CM

## 2016-05-25 LAB — CMP (CANCER CENTER ONLY)
ALBUMIN: 3.9 g/dL (ref 3.3–5.5)
ALT(SGPT): 14 U/L (ref 10–47)
AST: 17 U/L (ref 11–38)
Alkaline Phosphatase: 80 U/L (ref 26–84)
BUN: 15 mg/dL (ref 7–22)
CHLORIDE: 102 meq/L (ref 98–108)
CO2: 30 meq/L (ref 18–33)
CREATININE: 1.6 mg/dL — AB (ref 0.6–1.2)
Calcium: 11.1 mg/dL — ABNORMAL HIGH (ref 8.0–10.3)
Glucose, Bld: 179 mg/dL — ABNORMAL HIGH (ref 73–118)
POTASSIUM: 4.4 meq/L (ref 3.3–4.7)
SODIUM: 133 meq/L (ref 128–145)
TOTAL PROTEIN: 5.9 g/dL — AB (ref 6.4–8.1)
Total Bilirubin: 0.7 mg/dl (ref 0.20–1.60)

## 2016-05-25 LAB — MANUAL DIFFERENTIAL (CHCC SATELLITE)
ALC: 1.3 10*3/uL (ref 0.6–2.2)
ANC (CHCC MAN DIFF): 1.6 10*3/uL (ref 1.5–6.7)
Eos: 1 % (ref 0–7)
LYMPH: 37 % (ref 14–48)
MONO: 16 % — AB (ref 0–13)
PLT EST ~~LOC~~: ADEQUATE
SEG: 46 % (ref 40–75)

## 2016-05-25 LAB — CBC WITH DIFFERENTIAL (CANCER CENTER ONLY)
HCT: 29.1 % — ABNORMAL LOW (ref 34.8–46.6)
HEMOGLOBIN: 10 g/dL — AB (ref 11.6–15.9)
MCH: 25.7 pg — ABNORMAL LOW (ref 26.0–34.0)
MCHC: 34.4 g/dL (ref 32.0–36.0)
MCV: 75 fL — ABNORMAL LOW (ref 81–101)
Platelets: 161 10*3/uL (ref 145–400)
RBC: 3.89 10*6/uL (ref 3.70–5.32)
RDW: 15.2 % (ref 11.1–15.7)
WBC: 3.5 10*3/uL — AB (ref 3.9–10.0)

## 2016-05-25 LAB — IRON AND TIBC
%SAT: 30 % (ref 21–57)
IRON: 83 ug/dL (ref 41–142)
TIBC: 278 ug/dL (ref 236–444)
UIBC: 195 ug/dL (ref 120–384)

## 2016-05-25 LAB — FERRITIN: FERRITIN: 252 ng/mL (ref 9–269)

## 2016-05-25 MED ORDER — SODIUM CHLORIDE 0.9 % IJ SOLN
10.0000 mL | INTRAMUSCULAR | Status: DC | PRN
  Administered 2016-05-25: 10 mL
  Filled 2016-05-25: qty 10

## 2016-05-25 MED ORDER — SODIUM CHLORIDE 0.9 % IV SOLN
1000.0000 mg | Freq: Once | INTRAVENOUS | Status: AC
  Administered 2016-05-25: 1000 mg via INTRAVENOUS
  Filled 2016-05-25: qty 40

## 2016-05-25 MED ORDER — DIPHENHYDRAMINE HCL 50 MG/ML IJ SOLN
INTRAMUSCULAR | Status: AC
  Filled 2016-05-25: qty 1

## 2016-05-25 MED ORDER — SODIUM CHLORIDE 0.9 % IV SOLN
Freq: Once | INTRAVENOUS | Status: AC
  Administered 2016-05-25: 11:00:00 via INTRAVENOUS

## 2016-05-25 MED ORDER — FAMOTIDINE IN NACL 20-0.9 MG/50ML-% IV SOLN
INTRAVENOUS | Status: AC
  Filled 2016-05-25: qty 100

## 2016-05-25 MED ORDER — IBUPROFEN 200 MG PO TABS
ORAL_TABLET | ORAL | Status: AC
  Filled 2016-05-25: qty 2

## 2016-05-25 MED ORDER — FAMOTIDINE IN NACL 20-0.9 MG/50ML-% IV SOLN
40.0000 mg | Freq: Once | INTRAVENOUS | Status: AC
Start: 2016-05-25 — End: 2016-05-25
  Administered 2016-05-25: 40 mg via INTRAVENOUS

## 2016-05-25 MED ORDER — DIPHENHYDRAMINE HCL 50 MG/ML IJ SOLN
50.0000 mg | Freq: Once | INTRAMUSCULAR | Status: AC
  Administered 2016-05-25: 50 mg via INTRAVENOUS

## 2016-05-25 MED ORDER — IBUPROFEN 200 MG PO TABS
400.0000 mg | ORAL_TABLET | Freq: Once | ORAL | Status: AC
  Administered 2016-05-25: 400 mg via ORAL

## 2016-05-25 MED ORDER — HEPARIN SOD (PORK) LOCK FLUSH 100 UNIT/ML IV SOLN
500.0000 [IU] | Freq: Once | INTRAVENOUS | Status: AC | PRN
  Administered 2016-05-25: 500 [IU]
  Filled 2016-05-25: qty 5

## 2016-05-25 NOTE — Progress Notes (Signed)
Hematology and Oncology Follow Up Visit  Megan Tate [id number] 04-12-1946 70 y.o. 05/25/2016   Principle Diagnosis:  Chronic lymphocytic leukemia- Trisomy 12  Current Therapy:   Status post 6 cycles of Gazyva/Bendamustine - completed4/2016 Maintenance Gazyva every 2 months s/p cycle 7    Interim History:  [name] is here today with her husband for a follow-up and treatment. She is doing quite well. She is tolerating treatment with Gazyva nicely and has no complaints at this time.  No fever, chills, n/v, cough, rash, dizziness, SOB, chest pain, palpitations, abdominal pain or changes in her bowel or bladder habits.  Her blood glucose levels are fairly well controlled on levamir and janumet. She is 89 at this time.  She has maintained a good appetite and is staying well hydrated. Her weight is stable.  No lymphadenopathy found on exam. No episodes of bleeding or bruising.  No swelling, tenderness, numbness or tingling in her extremities.   Medications:    Medication List       Accurate as of 05/25/16  9:51 AM. Always use your most recent med list.          cloNIDine 0.2 MG tablet Commonly known as:  CATAPRES Take 0.2 mg by mouth 2 (two) times daily.   dorzolamide-timolol 22.3-6.8 MG/ML ophthalmic solution Commonly known as:  COSOPT Place 1 drop into both eyes 2 (two) times daily.   esomeprazole 20 MG capsule Commonly known as:  NEXIUM Take 1 capsule (20 mg total) by mouth 2 (two) times daily before a meal.   fluconazole 200 MG tablet Commonly known as:  DIFLUCAN Take 1 tablet (200 mg total) by mouth daily.   glucose blood test strip Monitor blood glucose daily before every meal and at bedtime   JANUMET XR 50-1000 MG Tb24 Generic drug:  SitaGLIPtin-MetFORMIN HCl Take 2 tablets by mouth daily.   LEVEMIR FLEXTOUCH 100 UNIT/ML Pen Generic drug:  Insulin Detemir Inject 10 Units into the skin every morning.   lidocaine-prilocaine cream Commonly known as:   EMLA Apply to Port-A-Cath site 1 hour ride to treatment   losartan 100 MG tablet Commonly known as:  COZAAR Take 100 mg by mouth daily.   metoCLOPramide 10 MG tablet Commonly known as:  REGLAN Take 1 tablet (10 mg total) by mouth 3 (three) times daily before meals.   metoprolol succinate 100 MG 24 hr tablet Commonly known as:  TOPROL-XL Take 100 mg by mouth daily with lunch.   ONE TOUCH ULTRA SYSTEM KIT w/Device Kit 1 kit by Does not apply route once. Check blood sugar daily before meals and at bedtime.   orphenadrine 100 MG tablet Commonly known as:  NORFLEX TAKE 1 TABLET (100 MG TOTAL) BY MOUTH 2 (TWO) TIMES DAILY AS NEEDED FOR MUSCLE SPASMS.   rosuvastatin 20 MG tablet Commonly known as:  CRESTOR Take 20 mg by mouth daily.   spironolactone 25 MG tablet Commonly known as:  ALDACTONE TAKE ONE TABLET BY MOUTH ONE TIME DAILY       Allergies:  Allergies  Allergen Reactions  . Tylenol [Acetaminophen] Other (See Comments)    Messes with stomach    Past Medical History, Surgical history, Social history, and Family History were reviewed and updated.  Review of Systems: All other 10 point review of systems is negative.   Physical Exam:  height is _0  (1.626 m) and weight is 196 lb 1.9 oz (89 kg). Her oral temperature is 98.5 F (36.9 C). Her blood pressure is 157/61 (  abnormal) and her pulse is 58 (abnormal). Her respiration is 16.   Wt Readings from Last 3 Encounters:  05/25/16 196 lb 1.9 oz (89 kg)  03/26/16 199 lb (90.3 kg)  01/26/16 202 lb (91.6 kg)    Ocular: Sclerae unicteric, pupils equal, round and reactive to light Ear-nose-throat: Oropharynx clear, dentition fair Lymphatic: No cervical supraclavicular or axillary adenopathy Lungs no rales or rhonchi, good excursion bilaterally Heart regular rate and rhythm, no murmur appreciated Abd soft, nontender, positive bowel sounds, no liver or spleen tip palpated on exam MSK no focal spinal tenderness, no joint  edema Neuro: non-focal, well-oriented, appropriate affect Breasts: Deferred  Lab Results  Component Value Date   WBC 4.0 03/26/2016   HGB 10.4 (L) 03/26/2016   HCT 30.5 (L) 03/26/2016   MCV 76 (L) 03/26/2016   PLT 153 03/26/2016   Lab Results  Component Value Date   FERRITIN 242 03/26/2016   IRON 80 03/26/2016   TIBC 273 03/26/2016   UIBC 193 03/26/2016   IRONPCTSAT 29 03/26/2016   Lab Results  Component Value Date   RETICCTPCT 1.2 04/07/2014   RBC 4.01 03/26/2016   RETICCTABS 65.4 04/07/2014   No results found for: KPAFRELGTCHN, LAMBDASER, KAPLAMBRATIO Lab Results  Component Value Date   IGGSERUM 545 (L) 03/30/2015   IGA 41 (L) 03/30/2015   IGMSERUM <5 (L) 03/30/2015   Lab Results  Component Value Date   TOTALPROTELP 5.5 (L) 03/30/2015   ALBUMINELP 3.4 (L) 03/30/2015   A1GS 0.3 03/30/2015   A2GS 0.8 03/30/2015   BETS 0.4 03/30/2015   BETA2SER 0.2 03/30/2015   GAMS 0.5 (L) 03/30/2015   MSPIKE NOT DET 08/16/2014   SPEI * 03/30/2015     Chemistry      Component Value Date/Time   NA 136 03/26/2016 0914   K 4.0 03/26/2016 0914   CL 101 03/26/2016 0914   CO2 28 03/26/2016 0914   BUN 16 03/26/2016 0914   CREATININE 1.4 (H) 03/26/2016 0914      Component Value Date/Time   CALCIUM 10.9 (H) 03/26/2016 0914   ALKPHOS 76 03/26/2016 0914   AST 23 03/26/2016 0914   ALT 12 03/26/2016 0914   BILITOT 0.60 03/26/2016 0914     Impression and Plan: [name] is 70 year old African-American female with chronic lymphocytic leukemia. She completed treatment with Gazyva and Bendamustine in April and is now on maintenance [place]. She has tolerated this nicely so far and has no complaints at this time.  Hgb is stable at 10.0 with an MCV of 75. Platelets are 161. No problem with infections. Iron studies are pending.  She will complete Maintenance Gazyva in June 2018. We will proceed with treatment today as planned.  She has her current treatment and appointment schedule. We  will see her back on 10/31 for her follow-up with Dr. Marin Olp.   Both she and her husband know to call here with any questions or concerns. We can certainly see her sooner if need be.   Eliezer Bottom, NP 8/25/20179:51 AM

## 2016-05-25 NOTE — Patient Instructions (Signed)
Obinutuzumab injection What is this medicine? OBINUTUZUMAB (OH bi nue TOOZ ue mab) is a monoclonal antibody. It is used to treat chronic lymphocytic leukemia (CLL) and a type of non-Hodgkin lymphoma (NHL), follicular lymphoma. This medicine may be used for other purposes; ask your health care provider or pharmacist if you have questions. What should I tell my health care provider before I take this medicine? They need to know if you have any of these conditions: -infection (especially a virus infection such as hepatitis B virus) -lung or breathing disease -heart disease -take medicines that treat or prevent blood clots -an unusual or allergic reaction to obinutuzumab, other medicines, foods, dyes, or preservatives -pregnant or trying to get pregnant -breast-feeding How should I use this medicine? This medicine is for infusion into a vein. It is given by a health care professional in a hospital or clinic setting. Talk to your pediatrician regarding the use of this medicine in children. Special care may be needed. Overdosage: If you think you have taken too much of this medicine contact a poison control center or emergency room at once. NOTE: This medicine is only for you. Do not share this medicine with others. What if I miss a dose? Keep appointments for follow-up doses as directed. It is important not to miss your dose. Call your doctor or health care professional if you are unable to keep an appointment. What may interact with this medicine? -live virus vaccines This list may not describe all possible interactions. Give your health care provider a list of all the medicines, herbs, non-prescription drugs, or dietary supplements you use. Also tell them if you smoke, drink alcohol, or use illegal drugs. Some items may interact with your medicine. What should I watch for while using this medicine? Report any side effects that you notice during your treatment right away, such as changes in your  breathing, fever, chills, dizziness or lightheadedness. These effects are more common with the first dose. Visit your prescriber or health care professional for checks on your progress. You will need to have regular blood work. Report any other side effects. The side effects of this medicine can continue after you finish your treatment. Continue your course of treatment even though you feel ill unless your doctor tells you to stop. Call your doctor or health care professional for advice if you get a fever, chills or sore throat, or other symptoms of a cold or flu. Do not treat yourself. This drug decreases your body's ability to fight infections. Try to avoid being around people who are sick. This medicine may increase your risk to bruise or bleed. Call your doctor or health care professional if you notice any unusual bleeding. What side effects may I notice from receiving this medicine? Side effects that you should report to your doctor or health care professional as soon as possible: -allergic reactions like skin rash, itching or hives, swelling of the face, lips, or tongue -breathing problems -changes in vision -chest pain or chest tightness -confusion -dizziness -loss of balance or coordination -low blood counts - this medicine may decrease the number of white blood cells, red blood cells and platelets. You may be at increased risk for infections and bleeding. -signs of decreased platelets or bleeding - bruising, pinpoint red spots on the skin, black, tarry stools, blood in the urine -signs of infection - fever or chills, cough, sore throat, pain or trouble passing urine -signs and symptoms of liver injury like dark yellow or brown urine; general ill   feeling or flu-like symptoms; light-colored stools; loss of appetite; nausea; right upper belly pain; unusually weak or tired; yellowing of the eyes or skin -trouble speaking or understanding -trouble walking -vomiting Side effects that usually  do not require medical attention (Report these to your doctor or health care professional if they continue or are bothersome.): -constipation -joint pain -muscle pain This list may not describe all possible side effects. Call your doctor for medical advice about side effects. You may report side effects to FDA at 1-800-FDA-1088. Where should I keep my medicine? This drug is only given in a hospital or clinic and will not be stored at home. NOTE: This sheet is a summary. It may not cover all possible information. If you have questions about this medicine, talk to your doctor, pharmacist, or health care provider.    2016, [employer] Standard. (2014-12-03 09:58:07)  

## 2016-06-12 ENCOUNTER — Other Ambulatory Visit: Payer: Self-pay | Admitting: Hematology & Oncology

## 2016-06-12 ENCOUNTER — Ambulatory Visit (HOSPITAL_BASED_OUTPATIENT_CLINIC_OR_DEPARTMENT_OTHER)
Admission: RE | Admit: 2016-06-12 | Discharge: 2016-06-12 | Disposition: A | Discharge status: Home / Self Care | Payer: Medicare Other | Source: Ambulatory Visit | Attending: Hematology & Oncology | Admitting: Hematology & Oncology

## 2016-06-12 ENCOUNTER — Other Ambulatory Visit (HOSPITAL_BASED_OUTPATIENT_CLINIC_OR_DEPARTMENT_OTHER): Payer: Medicare Other

## 2016-06-12 ENCOUNTER — Encounter: Payer: Self-pay | Admitting: Hematology & Oncology

## 2016-06-12 ENCOUNTER — Ambulatory Visit (HOSPITAL_BASED_OUTPATIENT_CLINIC_OR_DEPARTMENT_OTHER): Payer: Medicare Other | Admitting: Hematology & Oncology

## 2016-06-12 ENCOUNTER — Ambulatory Visit (HOSPITAL_BASED_OUTPATIENT_CLINIC_OR_DEPARTMENT_OTHER): Payer: Medicare Other

## 2016-06-12 VITALS — BP 151/61 | HR 61 | Temp 99.2°F | Wt 194.1 lb

## 2016-06-12 DIAGNOSIS — C9110 Chronic lymphocytic leukemia of B-cell type not having achieved remission: Secondary | ICD-10-CM | POA: Diagnosis not present

## 2016-06-12 DIAGNOSIS — D508 Other iron deficiency anemias: Secondary | ICD-10-CM

## 2016-06-12 DIAGNOSIS — R509 Fever, unspecified: Secondary | ICD-10-CM | POA: Insufficient documentation

## 2016-06-12 DIAGNOSIS — J0111 Acute recurrent frontal sinusitis: Secondary | ICD-10-CM

## 2016-06-12 DIAGNOSIS — D631 Anemia in chronic kidney disease: Secondary | ICD-10-CM

## 2016-06-12 DIAGNOSIS — N182 Chronic kidney disease, stage 2 (mild): Secondary | ICD-10-CM

## 2016-06-12 LAB — CBC WITH DIFFERENTIAL (CANCER CENTER ONLY)
HCT: 27.2 % — ABNORMAL LOW (ref 34.8–46.6)
HGB: 9.4 g/dL — ABNORMAL LOW (ref 11.6–15.9)
MCH: 25.6 pg — AB (ref 26.0–34.0)
MCHC: 34.6 g/dL (ref 32.0–36.0)
MCV: 74 fL — AB (ref 81–101)
PLATELETS: 217 10*3/uL (ref 145–400)
RBC: 3.67 10*6/uL — AB (ref 3.70–5.32)
RDW: 14.9 % (ref 11.1–15.7)
WBC: 3.7 10*3/uL — ABNORMAL LOW (ref 3.9–10.0)

## 2016-06-12 LAB — MANUAL DIFFERENTIAL (CHCC SATELLITE)
ALC: 1.6 10*3/uL (ref 0.6–2.2)
ANC (CHCC HP manual diff): 1.3 10*3/uL — ABNORMAL LOW (ref 1.5–6.7)
LYMPH: 44 % (ref 14–48)
MONO: 20 % — AB (ref 0–13)
MYELOCYTES: 2 % — AB (ref 0–0)
PLT EST ~~LOC~~: ADEQUATE
SEG: 34 % — AB (ref 40–75)

## 2016-06-12 LAB — CMP (CANCER CENTER ONLY)
ALBUMIN: 3.5 g/dL (ref 3.3–5.5)
ALT(SGPT): 15 U/L (ref 10–47)
AST: 20 U/L (ref 11–38)
Alkaline Phosphatase: 68 U/L (ref 26–84)
BUN, Bld: 14 mg/dL (ref 7–22)
CHLORIDE: 101 meq/L (ref 98–108)
CO2: 28 meq/L (ref 18–33)
CREATININE: 1.4 mg/dL — AB (ref 0.6–1.2)
Calcium: 10.7 mg/dL — ABNORMAL HIGH (ref 8.0–10.3)
Glucose, Bld: 143 mg/dL — ABNORMAL HIGH (ref 73–118)
POTASSIUM: 4 meq/L (ref 3.3–4.7)
SODIUM: 134 meq/L (ref 128–145)
TOTAL PROTEIN: 6.1 g/dL — AB (ref 6.4–8.1)
Total Bilirubin: 0.7 mg/dl (ref 0.20–1.60)

## 2016-06-12 LAB — LACTATE DEHYDROGENASE: LDH: 177 U/L (ref 125–245)

## 2016-06-12 MED ORDER — SODIUM CHLORIDE 0.9% FLUSH
10.0000 mL | INTRAVENOUS | Status: DC | PRN
  Administered 2016-06-12: 10 mL via INTRAVENOUS
  Filled 2016-06-12: qty 10

## 2016-06-12 MED ORDER — HEPARIN SOD (PORK) LOCK FLUSH 100 UNIT/ML IV SOLN
500.0000 [IU] | Freq: Once | INTRAVENOUS | Status: AC
  Administered 2016-06-12: 500 [IU] via INTRAVENOUS
  Filled 2016-06-12: qty 5

## 2016-06-12 MED ORDER — CEFDINIR 300 MG PO CAPS: ORAL_CAPSULE | ORAL | 0 refills | Status: DC

## 2016-06-12 NOTE — Progress Notes (Signed)
Hematology and Oncology Follow Up Visit  Megan Tate [id number] 27-Mar-1946 70 y.o. 06/12/2016   Principle Diagnosis:  Chronic lymphocytic leukemia- Trisomy 12  Current Therapy:   Status post 6 cycles of Gazyva/Bendamustine - completed4/2016 Maintenance Gazyva every 2 months s/p cycle 6    Interim History:  [name] is back for an unscheduled visit. Reck him into the building today, her husband was sitting down stairs. He told that she been having temperatures up to 102 for the past week. He said that she had a lot of congestion. She had a little bit of a cough. She had no rashes. She had no diarrhea.  She looks quite good. She really has no specific complaints outside of some sinus congestion.  She has had no change in bowel or bladder habits. She has had no melena or bright red blood per rectum.   There's been no palpable lymph nodes. She does have diabetes. She says her blood sugars are under decent control.   She has had no foreign travel. She's not been around anybody who has been sick.   She has no chills. She has no weight loss or weight gain.   Overall, her performance status is ECOG 1.   Medications:    Medication List       Accurate as of 06/12/16 11:04 AM. Always use your most recent med list.          cefdinir 300 MG capsule Commonly known as:  OMNICEF Take 2 capsules once a day for 7 days   cloNIDine 0.2 MG tablet Commonly known as:  CATAPRES Take 0.2 mg by mouth 2 (two) times daily.   dorzolamide-timolol 22.3-6.8 MG/ML ophthalmic solution Commonly known as:  COSOPT Place 1 drop into both eyes 2 (two) times daily.   esomeprazole 20 MG capsule Commonly known as:  NEXIUM Take 1 capsule (20 mg total) by mouth 2 (two) times daily before a meal.   fluconazole 200 MG tablet Commonly known as:  DIFLUCAN Take 1 tablet (200 mg total) by mouth daily.   glucose blood test strip Monitor blood glucose daily before every meal and at bedtime   JANUMET XR  50-1000 MG Tb24 Generic drug:  SitaGLIPtin-MetFORMIN HCl Take 2 tablets by mouth daily.   LEVEMIR FLEXTOUCH 100 UNIT/ML Pen Generic drug:  Insulin Detemir Inject 10 Units into the skin every morning.   lidocaine-prilocaine cream Commonly known as:  EMLA Apply to Port-A-Cath site 1 hour ride to treatment   losartan 100 MG tablet Commonly known as:  COZAAR Take 100 mg by mouth daily.   metoCLOPramide 10 MG tablet Commonly known as:  REGLAN Take 1 tablet (10 mg total) by mouth 3 (three) times daily before meals.   metoprolol succinate 100 MG 24 hr tablet Commonly known as:  TOPROL-XL Take 100 mg by mouth daily with lunch.   ONE TOUCH ULTRA SYSTEM KIT w/Device Kit 1 kit by Does not apply route once. Check blood sugar daily before meals and at bedtime.   orphenadrine 100 MG tablet Commonly known as:  NORFLEX TAKE 1 TABLET (100 MG TOTAL) BY MOUTH 2 (TWO) TIMES DAILY AS NEEDED FOR MUSCLE SPASMS.   rosuvastatin 20 MG tablet Commonly known as:  CRESTOR Take 20 mg by mouth daily.   spironolactone 25 MG tablet Commonly known as:  ALDACTONE TAKE ONE TABLET BY MOUTH ONE TIME DAILY       Allergies:  Allergies  Allergen Reactions  . Tylenol [Acetaminophen] Other (See Comments)  Messes with stomach    Past Medical History, Surgical history, Social history, and Family History were reviewed and updated.  Review of Systems: All other 10 point review of systems is negative.   Physical Exam:  weight is 194 lb 1.6 oz (88 kg). Her oral temperature is 99.2 F (37.3 C). Her blood pressure is 151/61 (abnormal) and her pulse is 61.   Wt Readings from Last 3 Encounters:  06/12/16 194 lb 1.6 oz (88 kg)  05/25/16 196 lb 1.9 oz (89 kg)  03/26/16 199 lb (90.3 kg)     well-developed and well-nourished African-American female in no obvious distress. Head and neck exam shows no ocular or oral lesions. She has no adenopathy in the neck. Lungs are clear. Cardiac exam regular rate and  rhythm with no murmurs, rubs or bruits. Abdomen is soft. She has no fluid wave. There is no palpable liver or spleen tip. Axillary exam shows no bilateral axillary adenopathy. Back exam shows no tenderness over the spine, ribs or hips. Extremities shows no clubbing, cyanosis or edema. Neurological exam shows no focal neurological deficits. Skin exam shows no rashes, ecchymoses or petechia. Lab Results  Component Value Date   WBC 3.7 (L) 06/12/2016   HGB 9.4 (L) 06/12/2016   HCT 27.2 (L) 06/12/2016   MCV 74 (L) 06/12/2016   PLT 217 06/12/2016   Lab Results  Component Value Date   FERRITIN 252 05/25/2016   IRON 83 05/25/2016   TIBC 278 05/25/2016   UIBC 195 05/25/2016   IRONPCTSAT 30 05/25/2016   Lab Results  Component Value Date   RETICCTPCT 1.2 04/07/2014   RBC 3.67 (L) 06/12/2016   RETICCTABS 65.4 04/07/2014   No results found for: KPAFRELGTCHN, LAMBDASER, KAPLAMBRATIO Lab Results  Component Value Date   IGGSERUM 545 (L) 03/30/2015   IGA 41 (L) 03/30/2015   IGMSERUM <5 (L) 03/30/2015   Lab Results  Component Value Date   TOTALPROTELP 5.5 (L) 03/30/2015   ALBUMINELP 3.4 (L) 03/30/2015   A1GS 0.3 03/30/2015   A2GS 0.8 03/30/2015   BETS 0.4 03/30/2015   BETA2SER 0.2 03/30/2015   GAMS 0.5 (L) 03/30/2015   MSPIKE NOT DET 08/16/2014   SPEI * 03/30/2015     Chemistry      Component Value Date/Time   NA 134 06/12/2016 0929   K 4.0 06/12/2016 0929   CL 101 06/12/2016 0929   CO2 28 06/12/2016 0929   BUN 14 06/12/2016 0929   CREATININE 1.4 (H) 06/12/2016 0929      Component Value Date/Time   CALCIUM 10.7 (H) 06/12/2016 0929   ALKPHOS 68 06/12/2016 0929   AST 20 06/12/2016 0929   ALT 15 06/12/2016 0929   BILITOT 0.70 06/12/2016 0929     Impression and Plan: [name] is 70 year old African-American female with chronic lymphocytic leukemia. She completed treatment with Gazyva and Bendamustine in April and is now on Maintenance Gazyva.    I'm not sure as to why she  has this temperature. She is minimally febrile today.  We did do a chest x-ray on her. This did not show any infiltrate.  She does have some sinus congestion. Maybe she does have a sinus infection. As such, I'll put her on some Omnicef.   I will check her urinalysis. I want to make sure that she does not have any urinary tract infection.   I'm worried mostly about her anemia. She keeps drifting downward. Her MCV also is going downward. I did give her 3  stool cards to take at home. She will return these hopefully within a week.  She says that she is on some iron supplements. She's never had a colonoscopy. She is one who does not like to have test run on her.   I would like to see her back in 3 weeks. Again, this anemia is a little bit troublesome for me. I will also make sure that I checked an erythropoietin level on her.  I spent about 30 minutes with she and her husband.    Volanda Napoleon, MD 9/12/201711:04 AM

## 2016-06-12 NOTE — Patient Instructions (Signed)

## 2016-06-13 ENCOUNTER — Other Ambulatory Visit (HOSPITAL_BASED_OUTPATIENT_CLINIC_OR_DEPARTMENT_OTHER): Payer: Medicare Other

## 2016-06-13 LAB — IGG, IGA, IGM
IGA/IMMUNOGLOBULIN A, SERUM: 46 mg/dL — AB (ref 87–352)
IGG (IMMUNOGLOBIN G), SERUM: 557 mg/dL — AB (ref 700–1600)
IgM, Qn, Serum: <5 mg/dL — ABNORMAL LOW (ref 26–217)

## 2016-06-13 LAB — BETA 2 MICROGLOBULIN, SERUM: BETA 2: 4.1 mg/L — AB (ref 0.6–2.4)

## 2016-06-22 ENCOUNTER — Other Ambulatory Visit: Payer: Self-pay | Admitting: Hematology & Oncology

## 2016-06-22 ENCOUNTER — Other Ambulatory Visit (HOSPITAL_BASED_OUTPATIENT_CLINIC_OR_DEPARTMENT_OTHER): Payer: Medicare Other | Admitting: Lab

## 2016-06-22 DIAGNOSIS — D500 Iron deficiency anemia secondary to blood loss (chronic): Secondary | ICD-10-CM

## 2016-06-22 DIAGNOSIS — D649 Anemia, unspecified: Secondary | ICD-10-CM | POA: Diagnosis not present

## 2016-06-22 LAB — FECAL OCCULT BLOOD, GUIAC - CHCC SATELLITE: Occult Blood: POSITIVE

## 2016-07-02 ENCOUNTER — Ambulatory Visit: Payer: Medicare Other | Admitting: Hematology & Oncology

## 2016-07-02 ENCOUNTER — Other Ambulatory Visit: Payer: Medicare Other

## 2016-07-04 ENCOUNTER — Other Ambulatory Visit: Payer: Medicare Other

## 2016-07-04 ENCOUNTER — Ambulatory Visit: Payer: Medicare Other

## 2016-07-04 ENCOUNTER — Encounter: Payer: Self-pay | Admitting: Hematology & Oncology

## 2016-07-04 ENCOUNTER — Other Ambulatory Visit (HOSPITAL_BASED_OUTPATIENT_CLINIC_OR_DEPARTMENT_OTHER): Payer: Medicare Other

## 2016-07-04 ENCOUNTER — Ambulatory Visit: Payer: Medicare Other | Admitting: Hematology & Oncology

## 2016-07-04 ENCOUNTER — Ambulatory Visit (HOSPITAL_BASED_OUTPATIENT_CLINIC_OR_DEPARTMENT_OTHER): Payer: Medicare Other | Admitting: Hematology & Oncology

## 2016-07-04 VITALS — BP 178/73 | HR 73 | Temp 98.0°F | Resp 16 | Ht 64.0 in | Wt 199.0 lb

## 2016-07-04 DIAGNOSIS — J0111 Acute recurrent frontal sinusitis: Secondary | ICD-10-CM

## 2016-07-04 DIAGNOSIS — N182 Chronic kidney disease, stage 2 (mild): Secondary | ICD-10-CM

## 2016-07-04 DIAGNOSIS — D631 Anemia in chronic kidney disease: Secondary | ICD-10-CM

## 2016-07-04 DIAGNOSIS — C9110 Chronic lymphocytic leukemia of B-cell type not having achieved remission: Secondary | ICD-10-CM

## 2016-07-04 DIAGNOSIS — D649 Anemia, unspecified: Secondary | ICD-10-CM | POA: Diagnosis not present

## 2016-07-04 DIAGNOSIS — D508 Other iron deficiency anemias: Secondary | ICD-10-CM

## 2016-07-04 LAB — MANUAL DIFFERENTIAL (CHCC SATELLITE)
ALC: 1.4 10*3/uL (ref 0.6–2.2)
ANC (CHCC HP manual diff): 1.7 10*3/uL (ref 1.5–6.7)
Band Neutrophils: 2 % (ref 0–10)
Eos: 3 % (ref 0–7)
LYMPH: 38 % (ref 14–48)
MONO: 12 % (ref 0–13)
PLT EST ~~LOC~~: ADEQUATE
SEG: 45 % (ref 40–75)

## 2016-07-04 LAB — CBC WITH DIFFERENTIAL (CANCER CENTER ONLY)
HEMATOCRIT: 27 % — AB (ref 34.8–46.6)
HGB: 9.2 g/dL — ABNORMAL LOW (ref 11.6–15.9)
MCH: 25.8 pg — ABNORMAL LOW (ref 26.0–34.0)
MCHC: 34.1 g/dL (ref 32.0–36.0)
MCV: 76 fL — AB (ref 81–101)
PLATELETS: 200 10*3/uL (ref 145–400)
RBC: 3.57 10*6/uL — AB (ref 3.70–5.32)
RDW: 16 % — AB (ref 11.1–15.7)
WBC: 3.6 10*3/uL — AB (ref 3.9–10.0)

## 2016-07-04 LAB — FERRITIN: FERRITIN: 291 ng/mL — AB (ref 9–269)

## 2016-07-04 LAB — IRON AND TIBC
%SAT: 33 % (ref 21–57)
Iron: 90 ug/dL (ref 41–142)
TIBC: 269 ug/dL (ref 236–444)
UIBC: 179 ug/dL (ref 120–384)

## 2016-07-04 LAB — CHCC SATELLITE - SMEAR

## 2016-07-04 MED ORDER — SODIUM CHLORIDE 0.9% FLUSH
10.0000 mL | INTRAVENOUS | Status: DC | PRN
  Administered 2016-07-04: 10 mL via INTRAVENOUS
  Filled 2016-07-04: qty 10

## 2016-07-04 MED ORDER — HEPARIN SOD (PORK) LOCK FLUSH 100 UNIT/ML IV SOLN
500.0000 [IU] | Freq: Once | INTRAVENOUS | Status: AC
  Administered 2016-07-04: 500 [IU] via INTRAVENOUS
  Filled 2016-07-04: qty 5

## 2016-07-04 NOTE — Patient Instructions (Signed)

## 2016-07-04 NOTE — Progress Notes (Signed)
Hematology and Oncology Follow Up Visit  Megan Tate [id number] Feb 16, 1946 70 y.o. 07/04/2016   Principle Diagnosis:  Chronic lymphocytic leukemia- Trisomy 12  Current Therapy:   Status post 6 cycles of Gazyva/Bendamustine - completed4/2016 Maintenance Gazyva every 2 months s/p cycle 6    Interim History:  Megan Tate is back for follow-up. She looks a lot better than when we last saw her 2 weeks ago. At that point time, she had a temperature. She is quite anemic. Her stools were heme positive. I did refer her to gastroenterology. She will see them next week. She has never had a colonoscopy.  Again, she feels better. I did put her on some antibiotics. I'm unsure if this made a difference or not.  She has not had any issues with nausea or vomiting. She has no chills. She has no weight loss or weight gain.   Her blood sugars have been doing pretty well from what she says. Typically, they are on the high side.  She has had no problems with fever. There's been no rashes. She's had no obvious melena or bright red blood per rectum.  There's been no cough or shortness of breath. She's had no sinus drainage.  Overall, her performance status is ECOG 1.   Medications:    Medication List       Accurate as of 07/04/16  8:32 AM. Always use your most recent med list.          cefdinir 300 MG capsule Commonly known as:  OMNICEF Take 2 capsules once a day for 7 days   cloNIDine 0.2 MG tablet Commonly known as:  CATAPRES Take 0.2 mg by mouth 2 (two) times daily.   dorzolamide-timolol 22.3-6.8 MG/ML ophthalmic solution Commonly known as:  COSOPT Place 1 drop into both eyes 2 (two) times daily.   esomeprazole 20 MG capsule Commonly known as:  NEXIUM Take 1 capsule (20 mg total) by mouth 2 (two) times daily before a meal.   fluconazole 200 MG tablet Commonly known as:  DIFLUCAN Take 1 tablet (200 mg total) by mouth daily.   glucose blood test strip Monitor blood glucose daily  before every meal and at bedtime   JANUMET XR 50-1000 MG Tb24 Generic drug:  SitaGLIPtin-MetFORMIN HCl Take 2 tablets by mouth daily.   LEVEMIR FLEXTOUCH 100 UNIT/ML Pen Generic drug:  Insulin Detemir Inject 10 Units into the skin every morning.   lidocaine-prilocaine cream Commonly known as:  EMLA Apply to Port-A-Cath site 1 hour ride to treatment   losartan 100 MG tablet Commonly known as:  COZAAR Take 100 mg by mouth daily.   metoCLOPramide 10 MG tablet Commonly known as:  REGLAN Take 1 tablet (10 mg total) by mouth 3 (three) times daily before meals.   metoprolol succinate 100 MG 24 hr tablet Commonly known as:  TOPROL-XL Take 100 mg by mouth daily with lunch.   ONE TOUCH ULTRA SYSTEM KIT w/Device Kit 1 kit by Does not apply route once. Check blood sugar daily before meals and at bedtime.   orphenadrine 100 MG tablet Commonly known as:  NORFLEX TAKE 1 TABLET (100 MG TOTAL) BY MOUTH 2 (TWO) TIMES DAILY AS NEEDED FOR MUSCLE SPASMS.   rosuvastatin 20 MG tablet Commonly known as:  CRESTOR Take 20 mg by mouth daily.   spironolactone 25 MG tablet Commonly known as:  ALDACTONE TAKE ONE TABLET BY MOUTH ONE TIME DAILY       Allergies:  Allergies  Allergen Reactions  .  Tylenol [Acetaminophen] Other (See Comments)    Messes with stomach    Past Medical History, Surgical history, Social history, and Family History were reviewed and updated.  Review of Systems: All other 10 point review of systems is negative.   Physical Exam:  height is _0  (1.626 m) and weight is 199 lb 0.6 oz (90.3 kg). Her oral temperature is 98 F (36.7 C). Her blood pressure is 178/73 (abnormal) and her pulse is 73. Her respiration is 16.   Wt Readings from Last 3 Encounters:  07/04/16 199 lb 0.6 oz (90.3 kg)  06/12/16 194 lb 1.6 oz (88 kg)  05/25/16 196 lb 1.9 oz (89 kg)     well-developed and well-nourished African-American female in no obvious distress. Head and neck exam shows no  ocular or oral lesions. She has no adenopathy in the neck. Lungs are clear. Cardiac exam regular rate and rhythm with no murmurs, rubs or bruits. Abdomen is soft. She has no fluid wave. There is no palpable liver or spleen tip. Axillary exam shows no bilateral axillary adenopathy. Back exam shows no tenderness over the spine, ribs or hips. Extremities shows no clubbing, cyanosis or edema. Neurological exam shows no focal neurological deficits. Skin exam shows no rashes, ecchymoses or petechia. Lab Results  Component Value Date   WBC 3.7 (L) 06/12/2016   HGB 9.4 (L) 06/12/2016   HCT 27.2 (L) 06/12/2016   MCV 74 (L) 06/12/2016   PLT 217 06/12/2016   Lab Results  Component Value Date   FERRITIN 252 05/25/2016   IRON 83 05/25/2016   TIBC 278 05/25/2016   UIBC 195 05/25/2016   IRONPCTSAT 30 05/25/2016   Lab Results  Component Value Date   RETICCTPCT 1.2 04/07/2014   RBC 3.67 (L) 06/12/2016   RETICCTABS 65.4 04/07/2014   No results found for: KPAFRELGTCHN, LAMBDASER, KAPLAMBRATIO Lab Results  Component Value Date   IGGSERUM 557 (L) 06/12/2016   IGA 41 (L) 03/30/2015   IGMSERUM <5 (L) 06/12/2016   Lab Results  Component Value Date   TOTALPROTELP 5.5 (L) 03/30/2015   ALBUMINELP 3.4 (L) 03/30/2015   A1GS 0.3 03/30/2015   A2GS 0.8 03/30/2015   BETS 0.4 03/30/2015   BETA2SER 0.2 03/30/2015   GAMS 0.5 (L) 03/30/2015   MSPIKE NOT DET 08/16/2014   SPEI * 03/30/2015     Chemistry      Component Value Date/Time   NA 134 06/12/2016 0929   K 4.0 06/12/2016 0929   CL 101 06/12/2016 0929   CO2 28 06/12/2016 0929   BUN 14 06/12/2016 0929   CREATININE 1.4 (H) 06/12/2016 0929      Component Value Date/Time   CALCIUM 10.7 (H) 06/12/2016 0929   ALKPHOS 68 06/12/2016 0929   AST 20 06/12/2016 0929   ALT 15 06/12/2016 0929   BILITOT 0.70 06/12/2016 0929     Impression and Plan: Megan Tate is 70 year old African-American female with chronic lymphocytic leukemia. She completed treatment  with Gazyva and Bendamustine in April 2016 and is now on Maintenance Gazyva.    I am glad to see that she is feeling better. She definitely needs to have the colonoscopy. This will be critical. She has never had one. Hopefully, nothing will be found that is malignant.   We will go ahead and give her the [place] today.   We'll plan to get her back here in 2 more months. We will certainly see her back sooner if she has any issues.  Volanda Napoleon, MD 10/4/20178:32 AM

## 2016-07-05 LAB — RETICULOCYTES: RETICULOCYTE COUNT: 1.4 % (ref 0.6–2.6)

## 2016-07-05 LAB — ERYTHROPOIETIN: ERYTHROPOIETIN: 51.6 m[IU]/mL — AB (ref 2.6–18.5)

## 2016-07-31 ENCOUNTER — Other Ambulatory Visit: Payer: Medicare Other

## 2016-07-31 ENCOUNTER — Ambulatory Visit: Payer: Medicare Other

## 2016-07-31 ENCOUNTER — Ambulatory Visit (HOSPITAL_BASED_OUTPATIENT_CLINIC_OR_DEPARTMENT_OTHER): Payer: Medicare Other

## 2016-07-31 ENCOUNTER — Ambulatory Visit: Payer: Medicare Other | Admitting: Hematology & Oncology

## 2016-07-31 ENCOUNTER — Other Ambulatory Visit (HOSPITAL_BASED_OUTPATIENT_CLINIC_OR_DEPARTMENT_OTHER): Payer: Medicare Other

## 2016-07-31 VITALS — BP 128/79 | HR 72 | Temp 97.8°F | Resp 16

## 2016-07-31 DIAGNOSIS — C9110 Chronic lymphocytic leukemia of B-cell type not having achieved remission: Secondary | ICD-10-CM

## 2016-07-31 DIAGNOSIS — Z5112 Encounter for antineoplastic immunotherapy: Secondary | ICD-10-CM

## 2016-07-31 LAB — MANUAL DIFFERENTIAL (CHCC SATELLITE)
ALC: 1.4 10*3/uL (ref 0.6–2.2)
ANC (CHCC HP manual diff): 1.7 10*3/uL (ref 1.5–6.7)
BAND NEUTROPHILS: 1 % (ref 0–10)
BASO: 2 % (ref 0–2)
Eos: 1 % (ref 0–7)
LYMPH: 37 % (ref 14–48)
MONO: 15 % — ABNORMAL HIGH (ref 0–13)
PLT EST ~~LOC~~: ADEQUATE
SEG: 44 % (ref 40–75)

## 2016-07-31 LAB — CBC WITH DIFFERENTIAL (CANCER CENTER ONLY)
HEMATOCRIT: 26.2 % — AB (ref 34.8–46.6)
HGB: 9.1 g/dL — ABNORMAL LOW (ref 11.6–15.9)
MCH: 25.9 pg — ABNORMAL LOW (ref 26.0–34.0)
MCHC: 34.7 g/dL (ref 32.0–36.0)
MCV: 75 fL — AB (ref 81–101)
PLATELETS: 183 10*3/uL (ref 145–400)
RBC: 3.51 10*6/uL — AB (ref 3.70–5.32)
RDW: 15.7 % (ref 11.1–15.7)
WBC: 3.8 10*3/uL — AB (ref 3.9–10.0)

## 2016-07-31 LAB — CMP (CANCER CENTER ONLY)
ALK PHOS: 77 U/L (ref 26–84)
ALT: 17 U/L (ref 10–47)
AST: 29 U/L (ref 11–38)
Albumin: 4.3 g/dL (ref 3.3–5.5)
BILIRUBIN TOTAL: 0.6 mg/dL (ref 0.20–1.60)
BUN: 22 mg/dL (ref 7–22)
CALCIUM: 11 mg/dL — AB (ref 8.0–10.3)
CO2: 26 mEq/L (ref 18–33)
Chloride: 103 mEq/L (ref 98–108)
Creat: 1.4 mg/dl — ABNORMAL HIGH (ref 0.6–1.2)
Glucose, Bld: 155 mg/dL — ABNORMAL HIGH (ref 73–118)
POTASSIUM: 3.7 meq/L (ref 3.3–4.7)
Sodium: 139 mEq/L (ref 128–145)
TOTAL PROTEIN: 6.3 g/dL — AB (ref 6.4–8.1)

## 2016-07-31 LAB — IRON AND TIBC
%SAT: 25 % (ref 21–57)
Iron: 75 ug/dL (ref 41–142)
TIBC: 301 ug/dL (ref 236–444)
UIBC: 227 ug/dL (ref 120–384)

## 2016-07-31 LAB — FERRITIN: FERRITIN: 250 ng/mL (ref 9–269)

## 2016-07-31 MED ORDER — FAMOTIDINE IN NACL 20-0.9 MG/50ML-% IV SOLN
INTRAVENOUS | Status: AC
  Filled 2016-07-31: qty 100

## 2016-07-31 MED ORDER — FAMOTIDINE IN NACL 20-0.9 MG/50ML-% IV SOLN
40.0000 mg | Freq: Once | INTRAVENOUS | Status: AC
  Administered 2016-07-31: 40 mg via INTRAVENOUS

## 2016-07-31 MED ORDER — SODIUM CHLORIDE 0.9 % IV SOLN
1000.0000 mg | Freq: Once | INTRAVENOUS | Status: AC
  Administered 2016-07-31: 1000 mg via INTRAVENOUS
  Filled 2016-07-31: qty 40

## 2016-07-31 MED ORDER — DIPHENHYDRAMINE HCL 50 MG/ML IJ SOLN
INTRAMUSCULAR | Status: AC
  Filled 2016-07-31: qty 1

## 2016-07-31 MED ORDER — HEPARIN SOD (PORK) LOCK FLUSH 100 UNIT/ML IV SOLN
500.0000 [IU] | Freq: Once | INTRAVENOUS | Status: AC | PRN
  Administered 2016-07-31: 500 [IU]
  Filled 2016-07-31: qty 5

## 2016-07-31 MED ORDER — DIPHENHYDRAMINE HCL 50 MG/ML IJ SOLN
50.0000 mg | Freq: Once | INTRAMUSCULAR | Status: AC
  Administered 2016-07-31: 50 mg via INTRAVENOUS

## 2016-07-31 MED ORDER — SODIUM CHLORIDE 0.9 % IJ SOLN
10.0000 mL | INTRAMUSCULAR | Status: DC | PRN
  Administered 2016-07-31: 10 mL
  Filled 2016-07-31: qty 10

## 2016-07-31 MED ORDER — SODIUM CHLORIDE 0.9 % IV SOLN
Freq: Once | INTRAVENOUS | Status: AC
  Administered 2016-07-31: 10:00:00 via INTRAVENOUS

## 2016-07-31 MED ORDER — IBUPROFEN 200 MG PO TABS
400.0000 mg | ORAL_TABLET | Freq: Once | ORAL | Status: AC
  Administered 2016-07-31: 400 mg via ORAL

## 2016-07-31 MED ORDER — IBUPROFEN 200 MG PO TABS
ORAL_TABLET | ORAL | Status: AC
  Filled 2016-07-31: qty 2

## 2016-07-31 NOTE — Patient Instructions (Signed)
Obinutuzumab injection What is this medicine? OBINUTUZUMAB (OH bi nue TOOZ ue mab) is a monoclonal antibody. It is used to treat chronic lymphocytic leukemia (CLL) and a type of non-Hodgkin lymphoma (NHL), follicular lymphoma. This medicine may be used for other purposes; ask your health care provider or pharmacist if you have questions. What should I tell my health care provider before I take this medicine? They need to know if you have any of these conditions: -infection (especially a virus infection such as hepatitis B virus) -lung or breathing disease -heart disease -take medicines that treat or prevent blood clots -an unusual or allergic reaction to obinutuzumab, other medicines, foods, dyes, or preservatives -pregnant or trying to get pregnant -breast-feeding How should I use this medicine? This medicine is for infusion into a vein. It is given by a health care professional in a hospital or clinic setting. Talk to your pediatrician regarding the use of this medicine in children. Special care may be needed. Overdosage: If you think you have taken too much of this medicine contact a poison control center or emergency room at once. NOTE: This medicine is only for you. Do not share this medicine with others. What if I miss a dose? Keep appointments for follow-up doses as directed. It is important not to miss your dose. Call your doctor or health care professional if you are unable to keep an appointment. What may interact with this medicine? -live virus vaccines This list may not describe all possible interactions. Give your health care provider a list of all the medicines, herbs, non-prescription drugs, or dietary supplements you use. Also tell them if you smoke, drink alcohol, or use illegal drugs. Some items may interact with your medicine. What should I watch for while using this medicine? Report any side effects that you notice during your treatment right away, such as changes in your  breathing, fever, chills, dizziness or lightheadedness. These effects are more common with the first dose. Visit your prescriber or health care professional for checks on your progress. You will need to have regular blood work. Report any other side effects. The side effects of this medicine can continue after you finish your treatment. Continue your course of treatment even though you feel ill unless your doctor tells you to stop. Call your doctor or health care professional for advice if you get a fever, chills or sore throat, or other symptoms of a cold or flu. Do not treat yourself. This drug decreases your body's ability to fight infections. Try to avoid being around people who are sick. This medicine may increase your risk to bruise or bleed. Call your doctor or health care professional if you notice any unusual bleeding. What side effects may I notice from receiving this medicine? Side effects that you should report to your doctor or health care professional as soon as possible: -allergic reactions like skin rash, itching or hives, swelling of the face, lips, or tongue -breathing problems -changes in vision -chest pain or chest tightness -confusion -dizziness -loss of balance or coordination -low blood counts - this medicine may decrease the number of white blood cells, red blood cells and platelets. You may be at increased risk for infections and bleeding. -signs of decreased platelets or bleeding - bruising, pinpoint red spots on the skin, black, tarry stools, blood in the urine -signs of infection - fever or chills, cough, sore throat, pain or trouble passing urine -signs and symptoms of liver injury like dark yellow or brown urine; general ill   feeling or flu-like symptoms; light-colored stools; loss of appetite; nausea; right upper belly pain; unusually weak or tired; yellowing of the eyes or skin -trouble speaking or understanding -trouble walking -vomiting Side effects that usually  do not require medical attention (Report these to your doctor or health care professional if they continue or are bothersome.): -constipation -joint pain -muscle pain This list may not describe all possible side effects. Call your doctor for medical advice about side effects. You may report side effects to FDA at 1-800-FDA-1088. Where should I keep my medicine? This drug is only given in a hospital or clinic and will not be stored at home. NOTE: This sheet is a summary. It may not cover all possible information. If you have questions about this medicine, talk to your doctor, pharmacist, or health care provider.    2016, [employer] Standard. (2014-12-03 09:58:07)  

## 2016-09-13 ENCOUNTER — Encounter: Payer: Self-pay | Admitting: Hematology & Oncology

## 2016-09-13 ENCOUNTER — Other Ambulatory Visit: Payer: Self-pay | Admitting: *Deleted

## 2016-09-13 MED ORDER — FLUCONAZOLE 200 MG PO TABS: 200.0000 mg | ORAL_TABLET | Freq: Every day | ORAL | 3 refills | Status: DC

## 2016-09-25 ENCOUNTER — Other Ambulatory Visit (HOSPITAL_BASED_OUTPATIENT_CLINIC_OR_DEPARTMENT_OTHER): Payer: Medicare Other

## 2016-09-25 ENCOUNTER — Ambulatory Visit (HOSPITAL_BASED_OUTPATIENT_CLINIC_OR_DEPARTMENT_OTHER): Payer: Medicare Other | Admitting: Hematology & Oncology

## 2016-09-25 ENCOUNTER — Ambulatory Visit (HOSPITAL_BASED_OUTPATIENT_CLINIC_OR_DEPARTMENT_OTHER): Payer: Medicare Other

## 2016-09-25 ENCOUNTER — Ambulatory Visit: Payer: Medicare Other

## 2016-09-25 VITALS — BP 159/70 | HR 58 | Temp 97.9°F | Resp 16

## 2016-09-25 VITALS — BP 149/58 | HR 65 | Temp 98.3°F | Resp 16 | Wt 207.0 lb

## 2016-09-25 DIAGNOSIS — D500 Iron deficiency anemia secondary to blood loss (chronic): Secondary | ICD-10-CM

## 2016-09-25 DIAGNOSIS — C9110 Chronic lymphocytic leukemia of B-cell type not having achieved remission: Secondary | ICD-10-CM

## 2016-09-25 DIAGNOSIS — Z5112 Encounter for antineoplastic immunotherapy: Secondary | ICD-10-CM

## 2016-09-25 LAB — CMP (CANCER CENTER ONLY)
ALT: 16 U/L (ref 10–47)
AST: 24 U/L (ref 11–38)
Albumin: 3.5 g/dL (ref 3.3–5.5)
Alkaline Phosphatase: 112 U/L — ABNORMAL HIGH (ref 26–84)
BILIRUBIN TOTAL: 0.5 mg/dL (ref 0.20–1.60)
BUN: 22 mg/dL (ref 7–22)
CHLORIDE: 101 meq/L (ref 98–108)
CO2: 29 mEq/L (ref 18–33)
CREATININE: 1.4 mg/dL — AB (ref 0.6–1.2)
Calcium: 11.3 mg/dL — ABNORMAL HIGH (ref 8.0–10.3)
Glucose, Bld: 210 mg/dL — ABNORMAL HIGH (ref 73–118)
Potassium: 4.1 mEq/L (ref 3.3–4.7)
SODIUM: 137 meq/L (ref 128–145)
TOTAL PROTEIN: 5.9 g/dL — AB (ref 6.4–8.1)

## 2016-09-25 LAB — CBC WITH DIFFERENTIAL (CANCER CENTER ONLY)
HCT: 29 % — ABNORMAL LOW (ref 34.8–46.6)
HGB: 10 g/dL — ABNORMAL LOW (ref 11.6–15.9)
MCH: 25.7 pg — ABNORMAL LOW (ref 26.0–34.0)
MCHC: 34.5 g/dL (ref 32.0–36.0)
MCV: 75 fL — ABNORMAL LOW (ref 81–101)
PLATELETS: 171 10*3/uL (ref 145–400)
RBC: 3.89 10*6/uL (ref 3.70–5.32)
RDW: 15.5 % (ref 11.1–15.7)
WBC: 3.9 10*3/uL (ref 3.9–10.0)

## 2016-09-25 LAB — MANUAL DIFFERENTIAL (CHCC SATELLITE)
ALC: 0.9 10*3/uL (ref 0.6–2.2)
ANC (CHCC HP manual diff): 1.7 10*3/uL (ref 1.5–6.7)
Eos: 5 % (ref 0–7)
LYMPH: 22 % (ref 14–48)
MONO: 29 % — ABNORMAL HIGH (ref 0–13)
PLT EST ~~LOC~~: ADEQUATE
SEG: 44 % (ref 40–75)

## 2016-09-25 LAB — IRON AND TIBC
%SAT: 28 % (ref 21–57)
Iron: 86 ug/dL (ref 41–142)
TIBC: 303 ug/dL (ref 236–444)
UIBC: 217 ug/dL (ref 120–384)

## 2016-09-25 LAB — FERRITIN: Ferritin: 188 ng/ml (ref 9–269)

## 2016-09-25 LAB — LACTATE DEHYDROGENASE: LDH: 171 U/L (ref 125–245)

## 2016-09-25 MED ORDER — HEPARIN SOD (PORK) LOCK FLUSH 100 UNIT/ML IV SOLN
500.0000 [IU] | Freq: Once | INTRAVENOUS | Status: AC | PRN
  Administered 2016-09-25: 500 [IU]
  Filled 2016-09-25: qty 5

## 2016-09-25 MED ORDER — FAMOTIDINE IN NACL 20-0.9 MG/50ML-% IV SOLN
40.0000 mg | Freq: Once | INTRAVENOUS | Status: AC
  Administered 2016-09-25: 40 mg via INTRAVENOUS

## 2016-09-25 MED ORDER — SODIUM CHLORIDE 0.9 % IV SOLN
1000.0000 mg | Freq: Once | INTRAVENOUS | Status: AC
  Administered 2016-09-25: 1000 mg via INTRAVENOUS
  Filled 2016-09-25: qty 40

## 2016-09-25 MED ORDER — DIPHENHYDRAMINE HCL 50 MG/ML IJ SOLN
50.0000 mg | Freq: Once | INTRAMUSCULAR | Status: AC
  Administered 2016-09-25: 50 mg via INTRAVENOUS

## 2016-09-25 MED ORDER — SODIUM CHLORIDE 0.9 % IJ SOLN
3.0000 mL | INTRAMUSCULAR | Status: DC | PRN
  Filled 2016-09-25: qty 10

## 2016-09-25 MED ORDER — SODIUM CHLORIDE 0.9 % IV SOLN
Freq: Once | INTRAVENOUS | Status: AC
  Administered 2016-09-25: 10:00:00 via INTRAVENOUS

## 2016-09-25 MED ORDER — IBUPROFEN 200 MG PO TABS
400.0000 mg | ORAL_TABLET | Freq: Once | ORAL | Status: AC
  Administered 2016-09-25: 400 mg via ORAL

## 2016-09-25 MED ORDER — IBUPROFEN 200 MG PO TABS
ORAL_TABLET | ORAL | Status: AC
  Filled 2016-09-25: qty 2

## 2016-09-25 MED ORDER — DIPHENHYDRAMINE HCL 50 MG/ML IJ SOLN
INTRAMUSCULAR | Status: AC
  Filled 2016-09-25: qty 1

## 2016-09-25 MED ORDER — FAMOTIDINE IN NACL 20-0.9 MG/50ML-% IV SOLN
INTRAVENOUS | Status: AC
  Filled 2016-09-25: qty 50

## 2016-09-25 NOTE — Patient Instructions (Signed)

## 2016-09-25 NOTE — Patient Instructions (Signed)
Obinutuzumab injection What is this medicine? OBINUTUZUMAB (OH bi nue TOOZ ue mab) is a monoclonal antibody. It is used to treat chronic lymphocytic leukemia (CLL) and a type of non-Hodgkin lymphoma (NHL), follicular lymphoma. COMMON BRAND NAME(S): GAZYVA What should I tell my health care provider before I take this medicine? They need to know if you have any of these conditions: -infection (especially a virus infection such as hepatitis B virus) -lung or breathing disease -heart disease -take medicines that treat or prevent blood clots -an unusual or allergic reaction to obinutuzumab, other medicines, foods, dyes, or preservatives -pregnant or trying to get pregnant -breast-feeding How should I use this medicine? This medicine is for infusion into a vein. It is given by a health care professional in a hospital or clinic setting. Talk to your pediatrician regarding the use of this medicine in children. Special care may be needed. What if I miss a dose? Keep appointments for follow-up doses as directed. It is important not to miss your dose. Call your doctor or health care professional if you are unable to keep an appointment. What may interact with this medicine? -live virus vaccines What should I watch for while using this medicine? Report any side effects that you notice during your treatment right away, such as changes in your breathing, fever, chills, dizziness or lightheadedness. These effects are more common with the first dose. Visit your prescriber or health care professional for checks on your progress. You will need to have regular blood work. Report any other side effects. The side effects of this medicine can continue after you finish your treatment. Continue your course of treatment even though you feel ill unless your doctor tells you to stop. Call your doctor or health care professional for advice if you get a fever, chills or sore throat, or other symptoms of a cold or flu. Do  not treat yourself. This drug decreases your body's ability to fight infections. Try to avoid being around people who are sick. This medicine may increase your risk to bruise or bleed. Call your doctor or health care professional if you notice any unusual bleeding. What side effects may I notice from receiving this medicine? Side effects that you should report to your doctor or health care professional as soon as possible: -allergic reactions like skin rash, itching or hives, swelling of the face, lips, or tongue -breathing problems -changes in vision -chest pain or chest tightness -confusion -dizziness -loss of balance or coordination -low blood counts - this medicine may decrease the number of white blood cells, red blood cells and platelets. You may be at increased risk for infections and bleeding. -signs of decreased platelets or bleeding - bruising, pinpoint red spots on the skin, black, tarry stools, blood in the urine -signs of infection - fever or chills, cough, sore throat, pain or trouble passing urine -signs and symptoms of liver injury like dark yellow or brown urine; general ill feeling or flu-like symptoms; light-colored stools; loss of appetite; nausea; right upper belly pain; unusually weak or tired; yellowing of the eyes or skin -trouble speaking or understanding -trouble walking -vomiting Side effects that usually do not require medical attention (report to your doctor or health care professional if they continue or are bothersome): -constipation -joint pain -muscle pain Where should I keep my medicine? This drug is only given in a hospital or clinic and will not be stored at home.  2017 [employer]/Gold Standard (2015-10-20 08:54:03)  

## 2016-09-25 NOTE — Progress Notes (Signed)
Hematology and Oncology Follow Up Visit  Megan Tate [id number] [birth year] 70 y.o. 09/25/2016   Principle Diagnosis:  Chronic lymphocytic leukemia- Trisomy 12  Current Therapy:   Status post 6 cycles of Gazyva/Bendamustine - completed4/2016 Maintenance Gazyva every 2 months s/p cycle 7    Interim History:  Megan Tate is back for follow-up. She seems be doing pretty well. She had very nice Thanksgiving and Christmas.  She did have a upper GI done. This was done on November 30. Nothing was found in the upper tract. She also had a colonoscopy. I do not see those results. I will have to call Dr. Osborn Coho office for this.  She is not having any obvious bleeding. She's had no abdominal pain. She's had no obvious change in bowel or bladder habits.   There's been no fever. She's had no infections. She's had no cough.   Her blood sugars seem to be doing pretty well from what she tells me.   Overall, her performance status is ECOG 1.   Medications:  Allergies as of 09/25/2016      Reactions   Tylenol [acetaminophen] Other (See Comments)   Messes with stomach      Medication List       Accurate as of 09/25/16  8:58 AM. Always use your most recent med list.          cefdinir 300 MG capsule Commonly known as:  OMNICEF Take 2 capsules once a day for 7 days   cloNIDine 0.2 MG tablet Commonly known as:  CATAPRES Take 0.2 mg by mouth 2 (two) times daily.   dorzolamide-timolol 22.3-6.8 MG/ML ophthalmic solution Commonly known as:  COSOPT Place 1 drop into both eyes 2 (two) times daily.   doxepin 10 MG capsule Commonly known as:  SINEQUAN Take 1 capsule by mouth daily.   esomeprazole 20 MG capsule Commonly known as:  NEXIUM Take 1 capsule (20 mg total) by mouth 2 (two) times daily before a meal.   fluconazole 200 MG tablet Commonly known as:  DIFLUCAN Take 1 tablet (200 mg total) by mouth daily.   FUSION PLUS Caps Take 1 capsule by mouth every morning.   glucose  blood test strip Monitor blood glucose daily before every meal and at bedtime   JANUMET XR 50-1000 MG Tb24 Generic drug:  SitaGLIPtin-MetFORMIN HCl Take 2 tablets by mouth daily.   LEVEMIR FLEXTOUCH 100 UNIT/ML Pen Generic drug:  Insulin Detemir Inject 10 Units into the skin every morning.   lidocaine-prilocaine cream Commonly known as:  EMLA Apply to Port-A-Cath site 1 hour ride to treatment   LINZESS 145 MCG Caps capsule Generic drug:  linaclotide Take 1 capsule by mouth daily.   losartan 100 MG tablet Commonly known as:  COZAAR Take 100 mg by mouth daily.   metoCLOPramide 10 MG tablet Commonly known as:  REGLAN Take 1 tablet (10 mg total) by mouth 3 (three) times daily before meals.   metoprolol succinate 100 MG 24 hr tablet Commonly known as:  TOPROL-XL Take 100 mg by mouth daily with lunch.   ONE TOUCH ULTRA SYSTEM KIT w/Device Kit 1 kit by Does not apply route once. Check blood sugar daily before meals and at bedtime.   orphenadrine 100 MG tablet Commonly known as:  NORFLEX TAKE 1 TABLET (100 MG TOTAL) BY MOUTH 2 (TWO) TIMES DAILY AS NEEDED FOR MUSCLE SPASMS.   rosuvastatin 20 MG tablet Commonly known as:  CRESTOR Take 20 mg by mouth daily.   spironolactone 25  MG tablet Commonly known as:  ALDACTONE TAKE ONE TABLET BY MOUTH ONE TIME DAILY       Allergies:  Allergies  Allergen Reactions  . Tylenol [Acetaminophen] Other (See Comments)    Messes with stomach    Past Medical History, Surgical history, Social history, and Family History were reviewed and updated.  Review of Systems: All other 10 point review of systems is negative.   Physical Exam:  weight is 207 lb (93.9 kg). Her oral temperature is 98.3 F (36.8 C). Her blood pressure is 149/58 (abnormal) and her pulse is 65. Her respiration is 16 and oxygen saturation is 100%.   Wt Readings from Last 3 Encounters:  09/25/16 207 lb (93.9 kg)  07/04/16 199 lb 0.6 oz (90.3 kg)  06/12/16 194 lb 1.6  oz (88 kg)     well-developed and well-nourished African-American female in no obvious distress. Head and neck exam shows no ocular or oral lesions. She has no adenopathy in the neck. Lungs are clear. Cardiac exam regular rate and rhythm with no murmurs, rubs or bruits. Abdomen is soft. She has no fluid wave. There is no palpable liver or spleen tip. Axillary exam shows no bilateral axillary adenopathy. Back exam shows no tenderness over the spine, ribs or hips. Extremities shows no clubbing, cyanosis or edema. Neurological exam shows no focal neurological deficits. Skin exam shows no rashes, ecchymoses or petechia. Lab Results  Component Value Date   WBC 3.8 (L) 07/31/2016   HGB 9.1 (L) 07/31/2016   HCT 26.2 (L) 07/31/2016   MCV 75 (L) 07/31/2016   PLT 183 07/31/2016   Lab Results  Component Value Date   FERRITIN 250 07/31/2016   IRON 75 07/31/2016   TIBC 301 07/31/2016   UIBC 227 07/31/2016   IRONPCTSAT 25 07/31/2016   Lab Results  Component Value Date   RETICCTPCT 1.2 04/07/2014   RBC 3.51 (L) 07/31/2016   RETICCTABS 65.4 04/07/2014   No results found for: KPAFRELGTCHN, LAMBDASER, [facility] Lab Results  Component Value Date   IGGSERUM 557 (L) 06/12/2016   IGA 41 (L) 03/30/2015   IGMSERUM <5 (L) 06/12/2016   Lab Results  Component Value Date   TOTALPROTELP 5.5 (L) 03/30/2015   ALBUMINELP 3.4 (L) 03/30/2015   A1GS 0.3 03/30/2015   A2GS 0.8 03/30/2015   BETS 0.4 03/30/2015   BETA2SER 0.2 03/30/2015   GAMS 0.5 (L) 03/30/2015   MSPIKE NOT DET 08/16/2014   SPEI * 03/30/2015     Chemistry      Component Value Date/Time   NA 139 07/31/2016 0910   K 3.7 07/31/2016 0910   CL 103 07/31/2016 0910   CO2 26 07/31/2016 0910   BUN 22 07/31/2016 0910   CREATININE 1.4 (H) 07/31/2016 0910      Component Value Date/Time   CALCIUM 11.0 (H) 07/31/2016 0910   ALKPHOS 77 07/31/2016 0910   AST 29 07/31/2016 0910   ALT 17 07/31/2016 0910   BILITOT 0.60 07/31/2016 0910      Impression and Plan: Megan Tate is 70 year old African-American female with chronic lymphocytic leukemia. She completed treatment with Gazyva and Bendamustine in April 2016 and is now on Maintenance Gazyva.    I am glad to see that she is feeling better. She definitely needs to have the colonoscopy. This will be critical. She has never had one. Hopefully, nothing will be found that is malignant.   We will go ahead and give her the Kirk today.   We'll plan to  get her back here in 2 more months. We will certainly see her back sooner if she has any issues.     Volanda Napoleon, MD 12/26/20178:58 AM

## 2016-09-26 LAB — IGG, IGA, IGM
IGA/IMMUNOGLOBULIN A, SERUM: 33 mg/dL — AB (ref 87–352)
IgG, Qn, Serum: 583 mg/dL — ABNORMAL LOW (ref 700–1600)

## 2016-11-28 ENCOUNTER — Ambulatory Visit: Payer: Medicare Other | Admitting: Hematology & Oncology

## 2016-11-28 ENCOUNTER — Ambulatory Visit: Payer: Medicare Other

## 2016-11-28 ENCOUNTER — Other Ambulatory Visit: Payer: Medicare Other

## 2016-11-28 ENCOUNTER — Ambulatory Visit (HOSPITAL_BASED_OUTPATIENT_CLINIC_OR_DEPARTMENT_OTHER): Payer: Medicare Other | Admitting: Hematology & Oncology

## 2016-11-28 ENCOUNTER — Ambulatory Visit (HOSPITAL_BASED_OUTPATIENT_CLINIC_OR_DEPARTMENT_OTHER): Payer: Medicare Other

## 2016-11-28 ENCOUNTER — Other Ambulatory Visit (HOSPITAL_BASED_OUTPATIENT_CLINIC_OR_DEPARTMENT_OTHER): Payer: Medicare Other

## 2016-11-28 VITALS — BP 170/68 | HR 54 | Temp 97.9°F | Resp 16

## 2016-11-28 VITALS — BP 192/87 | HR 61 | Temp 98.1°F | Resp 20 | Wt 209.5 lb

## 2016-11-28 DIAGNOSIS — C9110 Chronic lymphocytic leukemia of B-cell type not having achieved remission: Secondary | ICD-10-CM

## 2016-11-28 DIAGNOSIS — Z5112 Encounter for antineoplastic immunotherapy: Secondary | ICD-10-CM

## 2016-11-28 DIAGNOSIS — D500 Iron deficiency anemia secondary to blood loss (chronic): Secondary | ICD-10-CM | POA: Diagnosis not present

## 2016-11-28 LAB — CMP (CANCER CENTER ONLY)
ALBUMIN: 3.7 g/dL (ref 3.3–5.5)
ALK PHOS: 98 U/L — AB (ref 26–84)
ALT: 17 U/L (ref 10–47)
AST: 18 U/L (ref 11–38)
BILIRUBIN TOTAL: 0.6 mg/dL (ref 0.20–1.60)
BUN, Bld: 22 mg/dL (ref 7–22)
CALCIUM: 11.3 mg/dL — AB (ref 8.0–10.3)
CO2: 27 meq/L (ref 18–33)
CREATININE: 1.6 mg/dL — AB (ref 0.6–1.2)
Chloride: 103 mEq/L (ref 98–108)
Glucose, Bld: 243 mg/dL — ABNORMAL HIGH (ref 73–118)
Potassium: 4.1 mEq/L (ref 3.3–4.7)
SODIUM: 139 meq/L (ref 128–145)
TOTAL PROTEIN: 6.1 g/dL — AB (ref 6.4–8.1)

## 2016-11-28 LAB — IRON AND TIBC
%SAT: 33 % (ref 21–57)
IRON: 103 ug/dL (ref 41–142)
TIBC: 318 ug/dL (ref 236–444)
UIBC: 214 ug/dL (ref 120–384)

## 2016-11-28 LAB — MANUAL DIFFERENTIAL (CHCC SATELLITE)
ALC: 1.3 10*3/uL (ref 0.6–2.2)
ANC (CHCC MAN DIFF): 1.5 10*3/uL (ref 1.5–6.7)
BAND NEUTROPHILS: 2 % (ref 0–10)
BASO: 2 % (ref 0–2)
Eos: 5 % (ref 0–7)
LYMPH: 36 % (ref 14–48)
MONO: 16 % — ABNORMAL HIGH (ref 0–13)
PLT EST ~~LOC~~: ADEQUATE
SEG: 39 % — AB (ref 40–75)

## 2016-11-28 LAB — CBC WITH DIFFERENTIAL (CANCER CENTER ONLY)
HCT: 30.8 % — ABNORMAL LOW (ref 34.8–46.6)
HGB: 10.6 g/dL — ABNORMAL LOW (ref 11.6–15.9)
MCH: 26.2 pg (ref 26.0–34.0)
MCHC: 34.4 g/dL (ref 32.0–36.0)
MCV: 76 fL — AB (ref 81–101)
PLATELETS: 186 10*3/uL (ref 145–400)
RBC: 4.05 10*6/uL (ref 3.70–5.32)
RDW: 14.9 % (ref 11.1–15.7)
WBC: 3.7 10*3/uL — ABNORMAL LOW (ref 3.9–10.0)

## 2016-11-28 LAB — FERRITIN: FERRITIN: 214 ng/mL (ref 9–269)

## 2016-11-28 MED ORDER — FAMOTIDINE IN NACL 20-0.9 MG/50ML-% IV SOLN
40.0000 mg | Freq: Once | INTRAVENOUS | Status: AC
  Administered 2016-11-28: 40 mg via INTRAVENOUS

## 2016-11-28 MED ORDER — IBUPROFEN 200 MG PO TABS
400.0000 mg | ORAL_TABLET | Freq: Once | ORAL | Status: AC
  Administered 2016-11-28: 400 mg via ORAL

## 2016-11-28 MED ORDER — DIPHENHYDRAMINE HCL 50 MG/ML IJ SOLN
INTRAMUSCULAR | Status: AC
  Filled 2016-11-28: qty 1

## 2016-11-28 MED ORDER — HEPARIN SOD (PORK) LOCK FLUSH 100 UNIT/ML IV SOLN
500.0000 [IU] | Freq: Once | INTRAVENOUS | Status: AC | PRN
  Administered 2016-11-28: 500 [IU]
  Filled 2016-11-28: qty 5

## 2016-11-28 MED ORDER — SODIUM CHLORIDE 0.9 % IV SOLN
1000.0000 mg | Freq: Once | INTRAVENOUS | Status: AC
  Administered 2016-11-28: 1000 mg via INTRAVENOUS
  Filled 2016-11-28: qty 40

## 2016-11-28 MED ORDER — FAMOTIDINE IN NACL 20-0.9 MG/50ML-% IV SOLN
INTRAVENOUS | Status: AC
  Filled 2016-11-28: qty 100

## 2016-11-28 MED ORDER — SODIUM CHLORIDE 0.9 % IV SOLN
Freq: Once | INTRAVENOUS | Status: AC
  Administered 2016-11-28: 09:00:00 via INTRAVENOUS

## 2016-11-28 MED ORDER — SODIUM CHLORIDE 0.9 % IJ SOLN
10.0000 mL | INTRAMUSCULAR | Status: DC | PRN
  Administered 2016-11-28: 10 mL
  Filled 2016-11-28: qty 10

## 2016-11-28 MED ORDER — DIPHENHYDRAMINE HCL 50 MG/ML IJ SOLN
50.0000 mg | Freq: Once | INTRAMUSCULAR | Status: AC
  Administered 2016-11-28: 50 mg via INTRAVENOUS

## 2016-11-28 MED ORDER — IBUPROFEN 200 MG PO TABS
ORAL_TABLET | ORAL | Status: AC
  Filled 2016-11-28: qty 2

## 2016-11-28 NOTE — Patient Instructions (Signed)
Implanted Port Home Guide An implanted port is a type of central line that is placed under the skin. Central lines are used to provide IV access when treatment or nutrition needs to be given through a person's veins. Implanted ports are used for long-term IV access. An implanted port may be placed because:  You need IV medicine that would be irritating to the small veins in your hands or arms.  You need long-term IV medicines, such as antibiotics.  You need IV nutrition for a long period.  You need frequent blood draws for lab tests.  You need dialysis.  Implanted ports are usually placed in the chest area, but they can also be placed in the upper arm, the abdomen, or the leg. An implanted port has two main parts:  Reservoir. The reservoir is round and will appear as a small, raised area under your skin. The reservoir is the part where a needle is inserted to give medicines or draw blood.  Catheter. The catheter is a thin, flexible tube that extends from the reservoir. The catheter is placed into a large vein. Medicine that is inserted into the reservoir goes into the catheter and then into the vein.  How will I care for my incision site? Do not get the incision site wet. Bathe or shower as directed by your health care provider. How is my port accessed? Special steps must be taken to access the port:  Before the port is accessed, a numbing cream can be placed on the skin. This helps numb the skin over the port site.  Your health care provider uses a sterile technique to access the port. ? Your health care provider must put on a mask and sterile gloves. ? The skin over your port is cleaned carefully with an antiseptic and allowed to dry. ? The port is gently pinched between sterile gloves, and a needle is inserted into the port.  Only "non-coring" port needles should be used to access the port. Once the port is accessed, a blood return should be checked. This helps ensure that the port  is in the vein and is not clogged.  If your port needs to remain accessed for a constant infusion, a clear (transparent) bandage will be placed over the needle site. The bandage and needle will need to be changed every week, or as directed by your health care provider.  Keep the bandage covering the needle clean and dry. Do not get it wet. Follow your health care provider's instructions on how to take a shower or bath while the port is accessed.  If your port does not need to stay accessed, no bandage is needed over the port.  What is flushing? Flushing helps keep the port from getting clogged. Follow your health care provider's instructions on how and when to flush the port. Ports are usually flushed with saline solution or a medicine called heparin. The need for flushing will depend on how the port is used.  If the port is used for intermittent medicines or blood draws, the port will need to be flushed: ? After medicines have been given. ? After blood has been drawn. ? As part of routine maintenance.  If a constant infusion is running, the port may not need to be flushed.  How long will my port stay implanted? The port can stay in for as long as your health care provider thinks it is needed. When it is time for the port to come out, surgery will be   done to remove it. The procedure is similar to the one performed when the port was put in. When should I seek immediate medical care? When you have an implanted port, you should seek immediate medical care if:  You notice a bad smell coming from the incision site.  You have swelling, redness, or drainage at the incision site.  You have more swelling or pain at the port site or the surrounding area.  You have a fever that is not controlled with medicine.  This information is not intended to replace advice given to you by your health care provider. Make sure you discuss any questions you have with your health care provider. Document  Released: 09/17/2005 Document Revised: 02/23/2016 Document Reviewed: 05/25/2013 Elsevier Interactive Patient Education  2017 [employer]vier Inc[employer]  

## 2016-11-28 NOTE — Progress Notes (Signed)
Hematology and Oncology Follow Up Visit  Megan Tate [id number] [birth year] 71 y.o. 11/28/2016   Principle Diagnosis:  Chronic lymphocytic leukemia- Trisomy 12  Current Therapy:   Status post 6 cycles of Gazyva/Bendamustine - completed4/2016 Maintenance Gazyva every 2 months s/p cycle #10 - to complete 2 years in June 2018    Interim History:  Megan Tate is back for follow-up. She seems be doing pretty well.she really has no complaints. She did undergo colonoscopy. Everything looked okay. There is no malignancy.  Megan Tate blood sugars remain on the higher side. This is was been the way for Megan Tate.  She's had no issues with infections. She's had no problems with with cough or shortness of breath. There's been no leg swelling. She's had no rashes.   Overall, Megan Tate performance status is ECOG 1.   Medications:  Allergies as of 11/28/2016      Reactions   Tylenol [acetaminophen] Other (See Comments)   Messes with stomach      Medication List       Accurate as of 11/28/16  9:53 AM. Always use your most recent med list.          cloNIDine 0.2 MG tablet Commonly known as:  CATAPRES Take 0.2 mg by mouth 2 (two) times daily.   dorzolamide-timolol 22.3-6.8 MG/ML ophthalmic solution Commonly known as:  COSOPT Place 1 drop into both eyes 2 (two) times daily.   doxepin 10 MG capsule Commonly known as:  SINEQUAN Take 1 capsule by mouth daily.   esomeprazole 20 MG capsule Commonly known as:  NEXIUM Take 1 capsule (20 mg total) by mouth 2 (two) times daily before a meal.   fluconazole 200 MG tablet Commonly known as:  DIFLUCAN Take 1 tablet (200 mg total) by mouth daily.   FUSION PLUS Caps Take 1 capsule by mouth every morning.   glucose blood test strip Monitor blood glucose daily before every meal and at bedtime   JANUMET XR 50-1000 MG Tb24 Generic drug:  SitaGLIPtin-MetFORMIN HCl Take 2 tablets by mouth daily.   LEVEMIR FLEXTOUCH 100 UNIT/ML Pen Generic drug:  Insulin  Detemir Inject 10 Units into the skin every morning.   lidocaine-prilocaine cream Commonly known as:  EMLA Apply to Port-A-Cath site 1 hour ride to treatment   LINZESS 145 MCG Caps capsule Generic drug:  linaclotide Take 1 capsule by mouth daily.   losartan 100 MG tablet Commonly known as:  COZAAR Take 100 mg by mouth daily.   metoprolol succinate 100 MG 24 hr tablet Commonly known as:  TOPROL-XL Take 100 mg by mouth daily with lunch.   ONE TOUCH ULTRA SYSTEM KIT w/Device Kit 1 kit by Does not apply route once. Check blood sugar daily before meals and at bedtime.   orphenadrine 100 MG tablet Commonly known as:  NORFLEX TAKE 1 TABLET (100 MG TOTAL) BY MOUTH 2 (TWO) TIMES DAILY AS NEEDED FOR MUSCLE SPASMS.   rosuvastatin 20 MG tablet Commonly known as:  CRESTOR Take 20 mg by mouth daily.   spironolactone 25 MG tablet Commonly known as:  ALDACTONE TAKE ONE TABLET BY MOUTH ONE TIME DAILY       Allergies:  Allergies  Allergen Reactions  . Tylenol [Acetaminophen] Other (See Comments)    Messes with stomach    Past Medical History, Surgical history, Social history, and Family History were reviewed and updated.  Review of Systems: All other 10 point review of systems is negative.   Physical Exam:  weight is 209 lb 8  oz (95 kg). Megan Tate oral temperature is 98.1 F (36.7 C). Megan Tate blood pressure is 192/87 (abnormal) and Megan Tate pulse is 61. Megan Tate respiration is 20.   Wt Readings from Last 3 Encounters:  11/28/16 209 lb 8 oz (95 kg)  09/25/16 207 lb (93.9 kg)  07/04/16 199 lb 0.6 oz (90.3 kg)     well-developed and well-nourished African-American female in no obvious distress. Head and neck exam shows no ocular or oral lesions. She has no adenopathy in the neck. Lungs are clear. Cardiac exam regular rate and rhythm with no murmurs, rubs or bruits. Abdomen is soft. She has no fluid wave. There is no palpable liver or spleen tip. Axillary exam shows no bilateral axillary adenopathy.  Back exam shows no tenderness over the spine, ribs or hips. Extremities shows no clubbing, cyanosis or edema. Neurological exam shows no focal neurological deficits. Skin exam shows no rashes, ecchymoses or petechia. Lab Results  Component Value Date   WBC 3.7 (L) 11/28/2016   HGB 10.6 (L) 11/28/2016   HCT 30.8 (L) 11/28/2016   MCV 76 (L) 11/28/2016   PLT 186 11/28/2016   Lab Results  Component Value Date   FERRITIN 188 09/25/2016   IRON 86 09/25/2016   TIBC 303 09/25/2016   UIBC 217 09/25/2016   IRONPCTSAT 28 09/25/2016   Lab Results  Component Value Date   RETICCTPCT 1.2 04/07/2014   RBC 4.05 11/28/2016   RETICCTABS 65.4 04/07/2014   No results found for: KPAFRELGTCHN, LAMBDASER, KAPLAMBRATIO Lab Results  Component Value Date   IGGSERUM 583 (L) 09/25/2016   IGA 41 (L) 03/30/2015   IGMSERUM <5 (L) 09/25/2016   Lab Results  Component Value Date   TOTALPROTELP 5.5 (L) 03/30/2015   ALBUMINELP 3.4 (L) 03/30/2015   A1GS 0.3 03/30/2015   A2GS 0.8 03/30/2015   BETS 0.4 03/30/2015   BETA2SER 0.2 03/30/2015   GAMS 0.5 (L) 03/30/2015   MSPIKE NOT DET 08/16/2014   SPEI * 03/30/2015     Chemistry      Component Value Date/Time   NA 139 11/28/2016 0817   K 4.1 11/28/2016 0817   CL 103 11/28/2016 0817   CO2 27 11/28/2016 0817   BUN 22 11/28/2016 0817   CREATININE 1.6 (H) 11/28/2016 0817      Component Value Date/Time   CALCIUM 11.3 (H) 11/28/2016 0817   ALKPHOS 98 (H) 11/28/2016 0817   AST 18 11/28/2016 0817   ALT 17 11/28/2016 0817   BILITOT 0.60 11/28/2016 0817     Impression and Plan: Megan Tate is 71 year old African-American female with chronic lymphocytic leukemia. She completed treatment with Gazyva and Bendamustine in April 2016 and is now on Maintenance Gazyva.    She is doing great. She has had no problems with infections.  We will go ahead and plan to get Megan Tate back in 2 more months.    Volanda Napoleon, MD 2/28/20189:53 AM

## 2016-11-28 NOTE — Patient Instructions (Signed)
Obinutuzumab injection What is this medicine? OBINUTUZUMAB (OH bi nue TOOZ ue mab) is a monoclonal antibody. It is used to treat chronic lymphocytic leukemia (CLL) and a type of non-Hodgkin lymphoma (NHL), follicular lymphoma. This medicine may be used for other purposes; ask your health care provider or pharmacist if you have questions. COMMON BRAND NAME(S): GAZYVA What should I tell my health care provider before I take this medicine? They need to know if you have any of these conditions: -infection (especially a virus infection such as hepatitis B virus) -lung or breathing disease -heart disease -take medicines that treat or prevent blood clots -an unusual or allergic reaction to obinutuzumab, other medicines, foods, dyes, or preservatives -pregnant or trying to get pregnant -breast-feeding How should I use this medicine? This medicine is for infusion into a vein. It is given by a health care professional in a hospital or clinic setting. Talk to your pediatrician regarding the use of this medicine in children. Special care may be needed. Overdosage: If you think you have taken too much of this medicine contact a poison control center or emergency room at once. NOTE: This medicine is only for you. Do not share this medicine with others. What if I miss a dose? Keep appointments for follow-up doses as directed. It is important not to miss your dose. Call your doctor or health care professional if you are unable to keep an appointment. What may interact with this medicine? -live virus vaccines This list may not describe all possible interactions. Give your health care provider a list of all the medicines, herbs, non-prescription drugs, or dietary supplements you use. Also tell them if you smoke, drink alcohol, or use illegal drugs. Some items may interact with your medicine. What should I watch for while using this medicine? Report any side effects that you notice during your treatment right  away, such as changes in your breathing, fever, chills, dizziness or lightheadedness. These effects are more common with the first dose. Visit your prescriber or health care professional for checks on your progress. You will need to have regular blood work. Report any other side effects. The side effects of this medicine can continue after you finish your treatment. Continue your course of treatment even though you feel ill unless your doctor tells you to stop. Call your doctor or health care professional for advice if you get a fever, chills or sore throat, or other symptoms of a cold or flu. Do not treat yourself. This drug decreases your body's ability to fight infections. Try to avoid being around people who are sick. This medicine may increase your risk to bruise or bleed. Call your doctor or health care professional if you notice any unusual bleeding. What side effects may I notice from receiving this medicine? Side effects that you should report to your doctor or health care professional as soon as possible: -allergic reactions like skin rash, itching or hives, swelling of the face, lips, or tongue -breathing problems -changes in vision -chest pain or chest tightness -confusion -dizziness -loss of balance or coordination -low blood counts - this medicine may decrease the number of white blood cells, red blood cells and platelets. You may be at increased risk for infections and bleeding. -signs of decreased platelets or bleeding - bruising, pinpoint red spots on the skin, black, tarry stools, blood in the urine -signs of infection - fever or chills, cough, sore throat, pain or trouble passing urine -signs and symptoms of liver injury like dark yellow or   brown urine; general ill feeling or flu-like symptoms; light-colored stools; loss of appetite; nausea; right upper belly pain; unusually weak or tired; yellowing of the eyes or skin -trouble speaking or understanding -trouble  walking -vomiting Side effects that usually do not require medical attention (report to your doctor or health care professional if they continue or are bothersome): -constipation -joint pain -muscle pain This list may not describe all possible side effects. Call your doctor for medical advice about side effects. You may report side effects to FDA at 1-800-FDA-1088. Where should I keep my medicine? This drug is only given in a hospital or clinic and will not be stored at home. NOTE: This sheet is a summary. It may not cover all possible information. If you have questions about this medicine, talk to your doctor, pharmacist, or health care provider.  2018 Elsevier/Gold Standard (2015-10-20 08:54:03)  

## 2016-11-29 LAB — IGG, IGA, IGM
IGA/IMMUNOGLOBULIN A, SERUM: 33 mg/dL — AB (ref 87–352)
IGG (IMMUNOGLOBIN G), SERUM: 548 mg/dL — AB (ref 700–1600)

## 2017-01-28 ENCOUNTER — Other Ambulatory Visit (HOSPITAL_BASED_OUTPATIENT_CLINIC_OR_DEPARTMENT_OTHER): Payer: Medicare Other

## 2017-01-28 ENCOUNTER — Ambulatory Visit (HOSPITAL_BASED_OUTPATIENT_CLINIC_OR_DEPARTMENT_OTHER): Payer: Medicare Other

## 2017-01-28 ENCOUNTER — Ambulatory Visit (HOSPITAL_BASED_OUTPATIENT_CLINIC_OR_DEPARTMENT_OTHER): Payer: Medicare Other | Admitting: Family

## 2017-01-28 ENCOUNTER — Ambulatory Visit: Payer: Medicare Other

## 2017-01-28 VITALS — BP 143/59 | HR 61 | Temp 98.2°F | Resp 16 | Wt 211.0 lb

## 2017-01-28 VITALS — BP 138/71 | HR 57 | Temp 98.5°F | Resp 18

## 2017-01-28 DIAGNOSIS — C9110 Chronic lymphocytic leukemia of B-cell type not having achieved remission: Secondary | ICD-10-CM

## 2017-01-28 DIAGNOSIS — Z5112 Encounter for antineoplastic immunotherapy: Secondary | ICD-10-CM | POA: Diagnosis not present

## 2017-01-28 DIAGNOSIS — C9190 Lymphoid leukemia, unspecified not having achieved remission: Secondary | ICD-10-CM | POA: Diagnosis not present

## 2017-01-28 LAB — CBC WITH DIFFERENTIAL (CANCER CENTER ONLY)
HEMATOCRIT: 30.9 % — AB (ref 34.8–46.6)
HEMOGLOBIN: 10.5 g/dL — AB (ref 11.6–15.9)
MCH: 25.8 pg — AB (ref 26.0–34.0)
MCHC: 34 g/dL (ref 32.0–36.0)
MCV: 76 fL — ABNORMAL LOW (ref 81–101)
Platelets: 162 10*3/uL (ref 145–400)
RBC: 4.07 10*6/uL (ref 3.70–5.32)
RDW: 14.6 % (ref 11.1–15.7)
WBC: 3.5 10*3/uL — ABNORMAL LOW (ref 3.9–10.0)

## 2017-01-28 LAB — CMP (CANCER CENTER ONLY)
ALBUMIN: 3.7 g/dL (ref 3.3–5.5)
ALK PHOS: 88 U/L — AB (ref 26–84)
ALT(SGPT): 15 U/L (ref 10–47)
AST: 21 U/L (ref 11–38)
BILIRUBIN TOTAL: 0.8 mg/dL (ref 0.20–1.60)
BUN, Bld: 16 mg/dL (ref 7–22)
CALCIUM: 11 mg/dL — AB (ref 8.0–10.3)
CHLORIDE: 102 meq/L (ref 98–108)
CO2: 28 mEq/L (ref 18–33)
Creat: 1.2 mg/dl (ref 0.6–1.2)
GLUCOSE: 238 mg/dL — AB (ref 73–118)
Potassium: 3.7 mEq/L (ref 3.3–4.7)
Sodium: 138 mEq/L (ref 128–145)
TOTAL PROTEIN: 5.8 g/dL — AB (ref 6.4–8.1)

## 2017-01-28 LAB — MANUAL DIFFERENTIAL (CHCC SATELLITE)
ALC: 1.2 10*3/uL (ref 0.6–2.2)
ANC (CHCC MAN DIFF): 1.6 10*3/uL (ref 1.5–6.7)
BASO: 1 % (ref 0–2)
Band Neutrophils: 5 % (ref 0–10)
EOS: 4 % (ref 0–7)
LYMPH: 36 % (ref 14–48)
METAMYELOCYTES PCT: 1 % — AB (ref 0–0)
MONO: 13 % (ref 0–13)
PLT EST ~~LOC~~: ADEQUATE
SEG: 40 % (ref 40–75)

## 2017-01-28 MED ORDER — HEPARIN SOD (PORK) LOCK FLUSH 100 UNIT/ML IV SOLN
500.0000 [IU] | Freq: Once | INTRAVENOUS | Status: AC | PRN
  Administered 2017-01-28: 500 [IU]
  Filled 2017-01-28: qty 5

## 2017-01-28 MED ORDER — SODIUM CHLORIDE 0.9 % IV SOLN
1000.0000 mg | Freq: Once | INTRAVENOUS | Status: AC
  Administered 2017-01-28: 1000 mg via INTRAVENOUS
  Filled 2017-01-28: qty 40

## 2017-01-28 MED ORDER — FAMOTIDINE IN NACL 20-0.9 MG/50ML-% IV SOLN
INTRAVENOUS | Status: AC
  Filled 2017-01-28: qty 50

## 2017-01-28 MED ORDER — IBUPROFEN 200 MG PO TABS
400.0000 mg | ORAL_TABLET | Freq: Once | ORAL | Status: AC
  Administered 2017-01-28: 400 mg via ORAL

## 2017-01-28 MED ORDER — DIPHENHYDRAMINE HCL 50 MG/ML IJ SOLN
50.0000 mg | Freq: Once | INTRAMUSCULAR | Status: AC
  Administered 2017-01-28: 50 mg via INTRAVENOUS

## 2017-01-28 MED ORDER — SODIUM CHLORIDE 0.9 % IJ SOLN
10.0000 mL | INTRAMUSCULAR | Status: DC | PRN
  Administered 2017-01-28: 10 mL
  Filled 2017-01-28: qty 10

## 2017-01-28 MED ORDER — IBUPROFEN 200 MG PO TABS
ORAL_TABLET | ORAL | Status: AC
  Filled 2017-01-28: qty 2

## 2017-01-28 MED ORDER — DIPHENHYDRAMINE HCL 50 MG/ML IJ SOLN
INTRAMUSCULAR | Status: AC
  Filled 2017-01-28: qty 1

## 2017-01-28 MED ORDER — FAMOTIDINE IN NACL 20-0.9 MG/50ML-% IV SOLN
40.0000 mg | Freq: Once | INTRAVENOUS | Status: AC
  Administered 2017-01-28: 40 mg via INTRAVENOUS

## 2017-01-28 MED ORDER — SODIUM CHLORIDE 0.9 % IV SOLN
Freq: Once | INTRAVENOUS | Status: AC
  Administered 2017-01-28: 10:00:00 via INTRAVENOUS

## 2017-01-28 NOTE — Progress Notes (Signed)
Hematology and Oncology Follow Up Visit  Megan Tate [id number] 08/11/1946 71 y.o. 01/28/2017   Principle Diagnosis:  Chronic lymphocytic leukemia- Trisomy 12  Current Therapy:   Status post 6 cycles of Gazyva/Bendamustine - completed4/2016 Maintenance Gazyva every 2 months s/p cycle 17 - same plan, to complete 2 years in June 2018   Interim History:  Megan Tate is here today with her husband. She continues to do well and has no complaints at this time. She is enjoying the warm weather and sunshine we are having.  She has had no issue with infections. No fever, chills, n/v, cough, rash, dizziness, SOB, chest pain, palpitations, abdominal pain or changes in bowel or bladder habits.  No swelling, tenderness, numbness or tingling in her extremities. No c/o pain at this time.  She has maintained a good appetite and is staying well hydrated. Her weight is stable.  Her blood sugars are still not well controlled.   ECOG Performance Status: 1 - Symptomatic but completely ambulatory  Medications:  Allergies as of 01/28/2017      Reactions   Tylenol [acetaminophen] Other (See Comments)   Messes with stomach      Medication List       Accurate as of 01/28/17  8:39 AM. Always use your most recent med list.          cloNIDine 0.2 MG tablet Commonly known as:  CATAPRES Take 0.2 mg by mouth 2 (two) times daily.   dorzolamide-timolol 22.3-6.8 MG/ML ophthalmic solution Commonly known as:  COSOPT Place 1 drop into both eyes 2 (two) times daily.   doxepin 10 MG capsule Commonly known as:  SINEQUAN Take 1 capsule by mouth daily.   esomeprazole 20 MG capsule Commonly known as:  NEXIUM Take 1 capsule (20 mg total) by mouth 2 (two) times daily before a meal.   fluconazole 200 MG tablet Commonly known as:  DIFLUCAN Take 1 tablet (200 mg total) by mouth daily.   FUSION PLUS Caps Take 1 capsule by mouth every morning.   glucose blood test strip Monitor blood glucose daily before  every meal and at bedtime   JANUMET XR 50-1000 MG Tb24 Generic drug:  SitaGLIPtin-MetFORMIN HCl Take 2 tablets by mouth daily.   LEVEMIR FLEXTOUCH 100 UNIT/ML Pen Generic drug:  Insulin Detemir Inject 10 Units into the skin every morning.   lidocaine-prilocaine cream Commonly known as:  EMLA Apply to Port-A-Cath site 1 hour ride to treatment   LINZESS 145 MCG Caps capsule Generic drug:  linaclotide Take 1 capsule by mouth daily.   losartan 100 MG tablet Commonly known as:  COZAAR Take 100 mg by mouth daily.   metoprolol succinate 100 MG 24 hr tablet Commonly known as:  TOPROL-XL Take 100 mg by mouth daily with lunch.   ONE TOUCH ULTRA SYSTEM KIT w/Device Kit 1 kit by Does not apply route once. Check blood sugar daily before meals and at bedtime.   orphenadrine 100 MG tablet Commonly known as:  NORFLEX TAKE 1 TABLET (100 MG TOTAL) BY MOUTH 2 (TWO) TIMES DAILY AS NEEDED FOR MUSCLE SPASMS.   rosuvastatin 20 MG tablet Commonly known as:  CRESTOR Take 20 mg by mouth daily.   spironolactone 25 MG tablet Commonly known as:  ALDACTONE TAKE ONE TABLET BY MOUTH ONE TIME DAILY       Allergies:  Allergies  Allergen Reactions  . Tylenol [Acetaminophen] Other (See Comments)    Messes with stomach    Past Medical History, Surgical history, Social  history, and Family History were reviewed and updated.  Review of Systems: All other 10 point review of systems is negative.   Physical Exam:  vitals were not taken for this visit.  Wt Readings from Last 3 Encounters:  11/28/16 209 lb 8 oz (95 kg)  09/25/16 207 lb (93.9 kg)  07/04/16 199 lb 0.6 oz (90.3 kg)    Ocular: Sclerae unicteric, pupils equal, round and reactive to light Ear-nose-throat: Oropharynx clear, dentition fair Lymphatic: No cervical, supraclavicular or axillary adenopathy Lungs no rales or rhonchi, good excursion bilaterally Heart regular rate and rhythm, no murmur appreciated Abd soft, nontender,  positive bowel sounds, no liver or spleen tip palpated on exam, no fluid wave MSK no focal spinal tenderness, no joint edema Neuro: non-focal, well-oriented, appropriate affect Breasts: Deferred  Lab Results  Component Value Date   WBC 3.7 (L) 11/28/2016   HGB 10.6 (L) 11/28/2016   HCT 30.8 (L) 11/28/2016   MCV 76 (L) 11/28/2016   PLT 186 11/28/2016   Lab Results  Component Value Date   FERRITIN 214 11/28/2016   IRON 103 11/28/2016   TIBC 318 11/28/2016   UIBC 214 11/28/2016   IRONPCTSAT 33 11/28/2016   Lab Results  Component Value Date   RETICCTPCT 1.2 04/07/2014   RBC 4.05 11/28/2016   RETICCTABS 65.4 04/07/2014   No results found for: KPAFRELGTCHN, LAMBDASER, KAPLAMBRATIO Lab Results  Component Value Date   IGGSERUM 548 (L) 11/28/2016   IGA 41 (L) 03/30/2015   IGMSERUM <5 (L) 11/28/2016   Lab Results  Component Value Date   TOTALPROTELP 5.5 (L) 03/30/2015   ALBUMINELP 3.4 (L) 03/30/2015   A1GS 0.3 03/30/2015   A2GS 0.8 03/30/2015   BETS 0.4 03/30/2015   BETA2SER 0.2 03/30/2015   GAMS 0.5 (L) 03/30/2015   MSPIKE NOT DET 08/16/2014   SPEI * 03/30/2015     Chemistry      Component Value Date/Time   NA 139 11/28/2016 0817   K 4.1 11/28/2016 0817   CL 103 11/28/2016 0817   CO2 27 11/28/2016 0817   BUN 22 11/28/2016 0817   CREATININE 1.6 (H) 11/28/2016 0817      Component Value Date/Time   CALCIUM 11.3 (H) 11/28/2016 0817   ALKPHOS 98 (H) 11/28/2016 0817   AST 18 11/28/2016 0817   ALT 17 11/28/2016 0817   BILITOT 0.60 11/28/2016 0817      Impression and Plan: Megan Tate is a pleasant 71 yo African American female with chronic lymphocytic leukemia. She has completed her treatment with Gazyva and Bendamustine in April of 2016. She is now on maintenance Gazyva and will complete 2 years in June 2018.  She continues to do well and has no complaints at this time. She has had no issue with infections.  We will proceed with treatment today as planned. We will  then plan to see her back in 2 months for her last maintenance dose of Gazyva.  Both she and her husband know to contact our office with any questions or concerns. We can certainly see her sooner if need be.   Eliezer Bottom, NP 4/30/20188:39 AM

## 2017-01-28 NOTE — Patient Instructions (Signed)
[facility] Discharge Instructions for Patients Receiving Chemotherapy  Today you received the following chemotherapy agents: Gazyva   To help prevent nausea and vomiting after your treatment, we encourage you to take your nausea medication as directed.    If you develop nausea and vomiting that is not controlled by your nausea medication, call the clinic.   BELOW ARE SYMPTOMS THAT SHOULD BE REPORTED IMMEDIATELY:  *FEVER GREATER THAN 100.5 F  *CHILLS WITH OR WITHOUT FEVER  NAUSEA AND VOMITING THAT IS NOT CONTROLLED WITH YOUR NAUSEA MEDICATION  *UNUSUAL SHORTNESS OF BREATH  *UNUSUAL BRUISING OR BLEEDING  TENDERNESS IN MOUTH AND THROAT WITH OR WITHOUT PRESENCE OF ULCERS  *URINARY PROBLEMS  *BOWEL PROBLEMS  UNUSUAL RASH Items with * indicate a potential emergency and should be followed up as soon as possible.  Feel free to call the clinic you have any questions or concerns. The clinic phone number is [phone].  Please show the CHEMO ALERT CARD at check-in to the Emergency Department and triage nurse.   

## 2017-01-29 LAB — IGG, IGA, IGM
IGA/IMMUNOGLOBULIN A, SERUM: 32 mg/dL — AB (ref 87–352)
IgG, Qn, Serum: 516 mg/dL — ABNORMAL LOW (ref 700–1600)

## 2017-01-30 LAB — PROTEIN ELECTROPHORESIS, SERUM, WITH REFLEX
A/G RATIO SPE: 1.6 (ref 0.7–1.7)
Albumin: 3.6 g/dL (ref 2.9–4.4)
Alpha 1: 0.2 g/dL (ref 0.0–0.4)
Alpha 2: 0.8 g/dL (ref 0.4–1.0)
BETA: 0.8 g/dL (ref 0.7–1.3)
GAMMA GLOBULIN: 0.5 g/dL (ref 0.4–1.8)
GLOBULIN, TOTAL: 2.2 g/dL (ref 2.2–3.9)
TOTAL PROTEIN: 5.8 g/dL — AB (ref 6.0–8.5)

## 2017-01-30 LAB — KAPPA/LAMBDA LIGHT CHAINS
IG LAMBDA FREE LIGHT CHAIN: 6.3 mg/L (ref 5.7–26.3)
Ig Kappa Free Light Chain: 13 mg/L (ref 3.3–19.4)
KAPPA/LAMBDA FLC RATIO: 2.06 — AB (ref 0.26–1.65)

## 2017-02-03 IMAGING — CR DG CHEST 2V
2 series · 2 of 2 positions shown · non-contrast
Comparison: CT chest scan of 04/28/2014

CLINICAL DATA: Chronic lymphocytic leukemia, fever

EXAM:
CHEST  2 VIEW

[w chest pa]
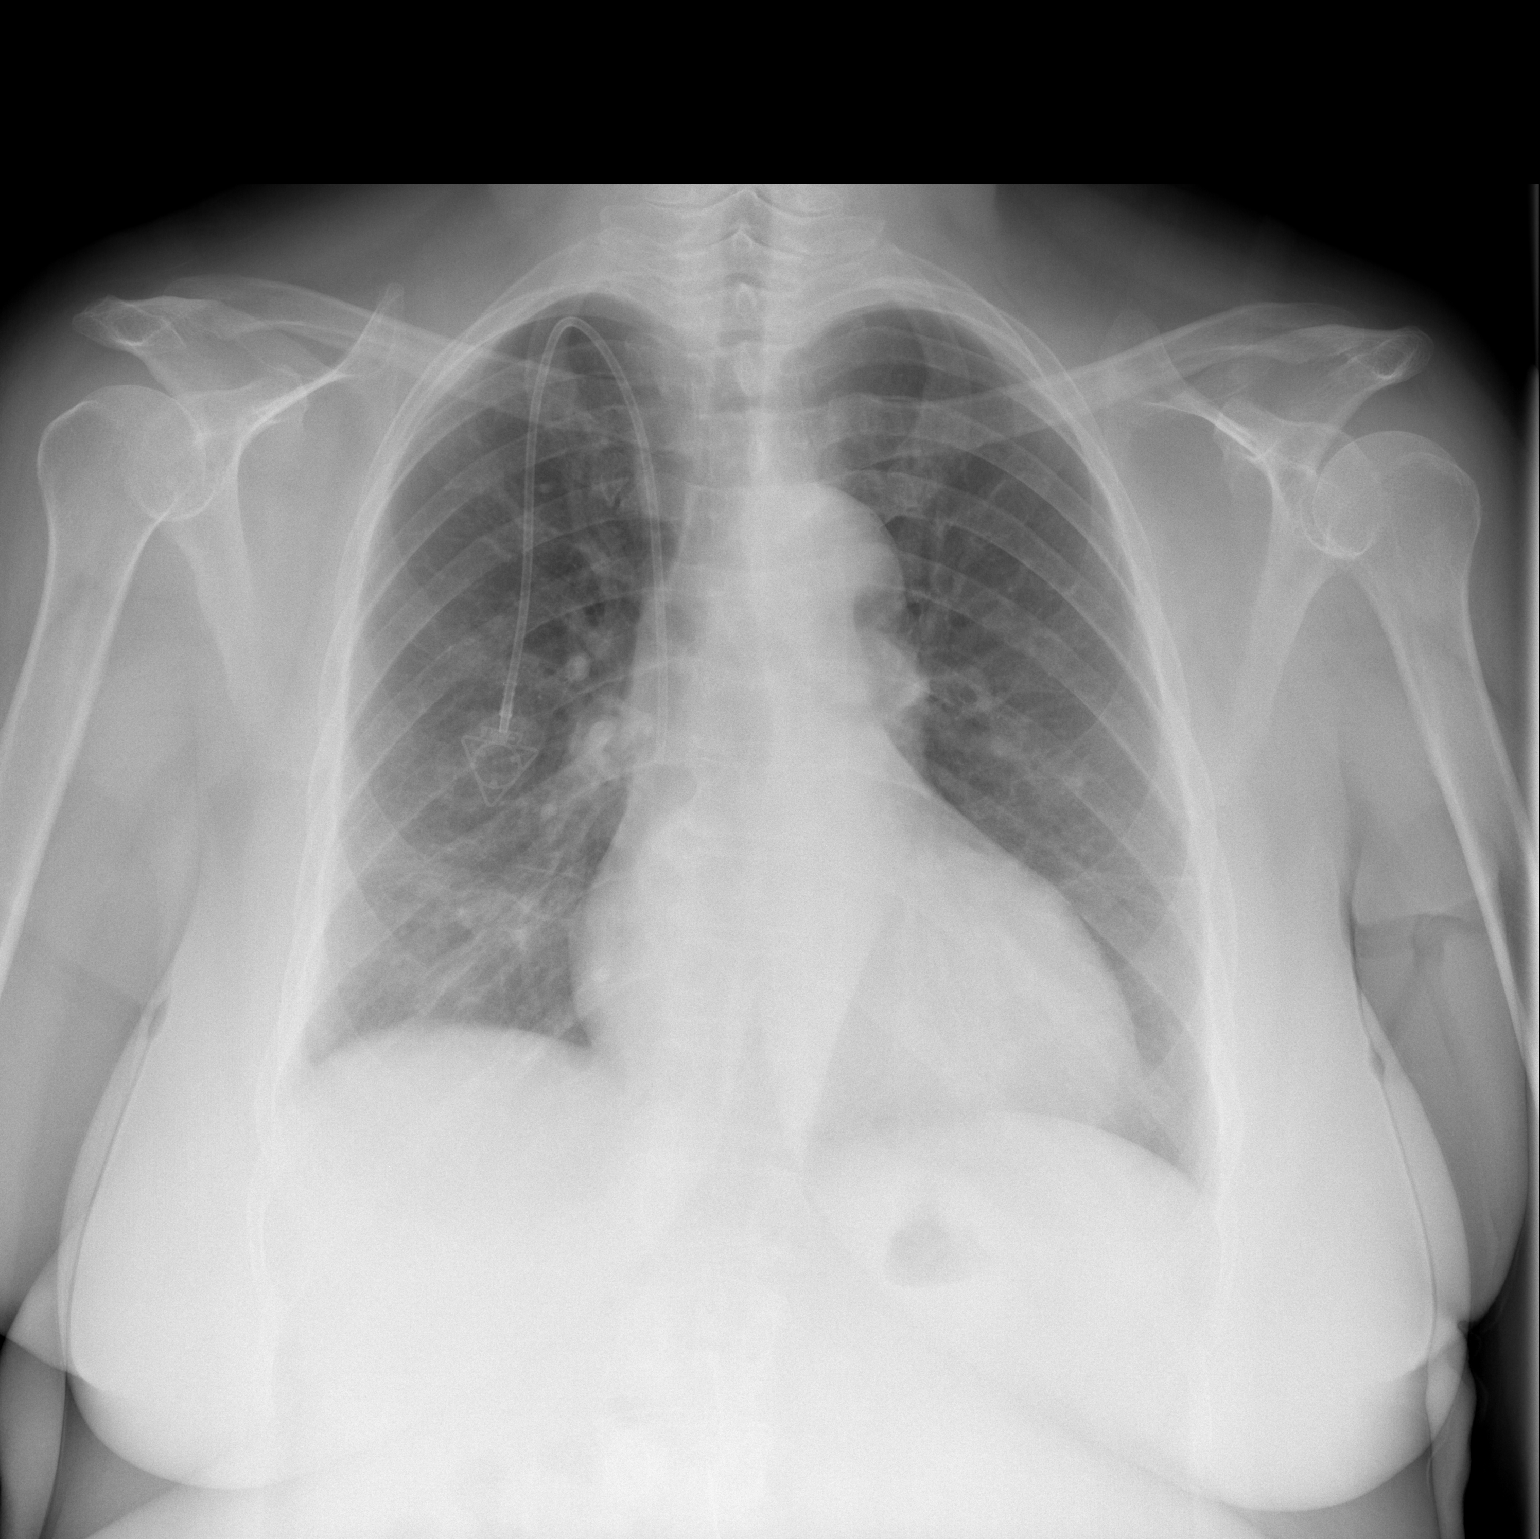

[w chest lat]
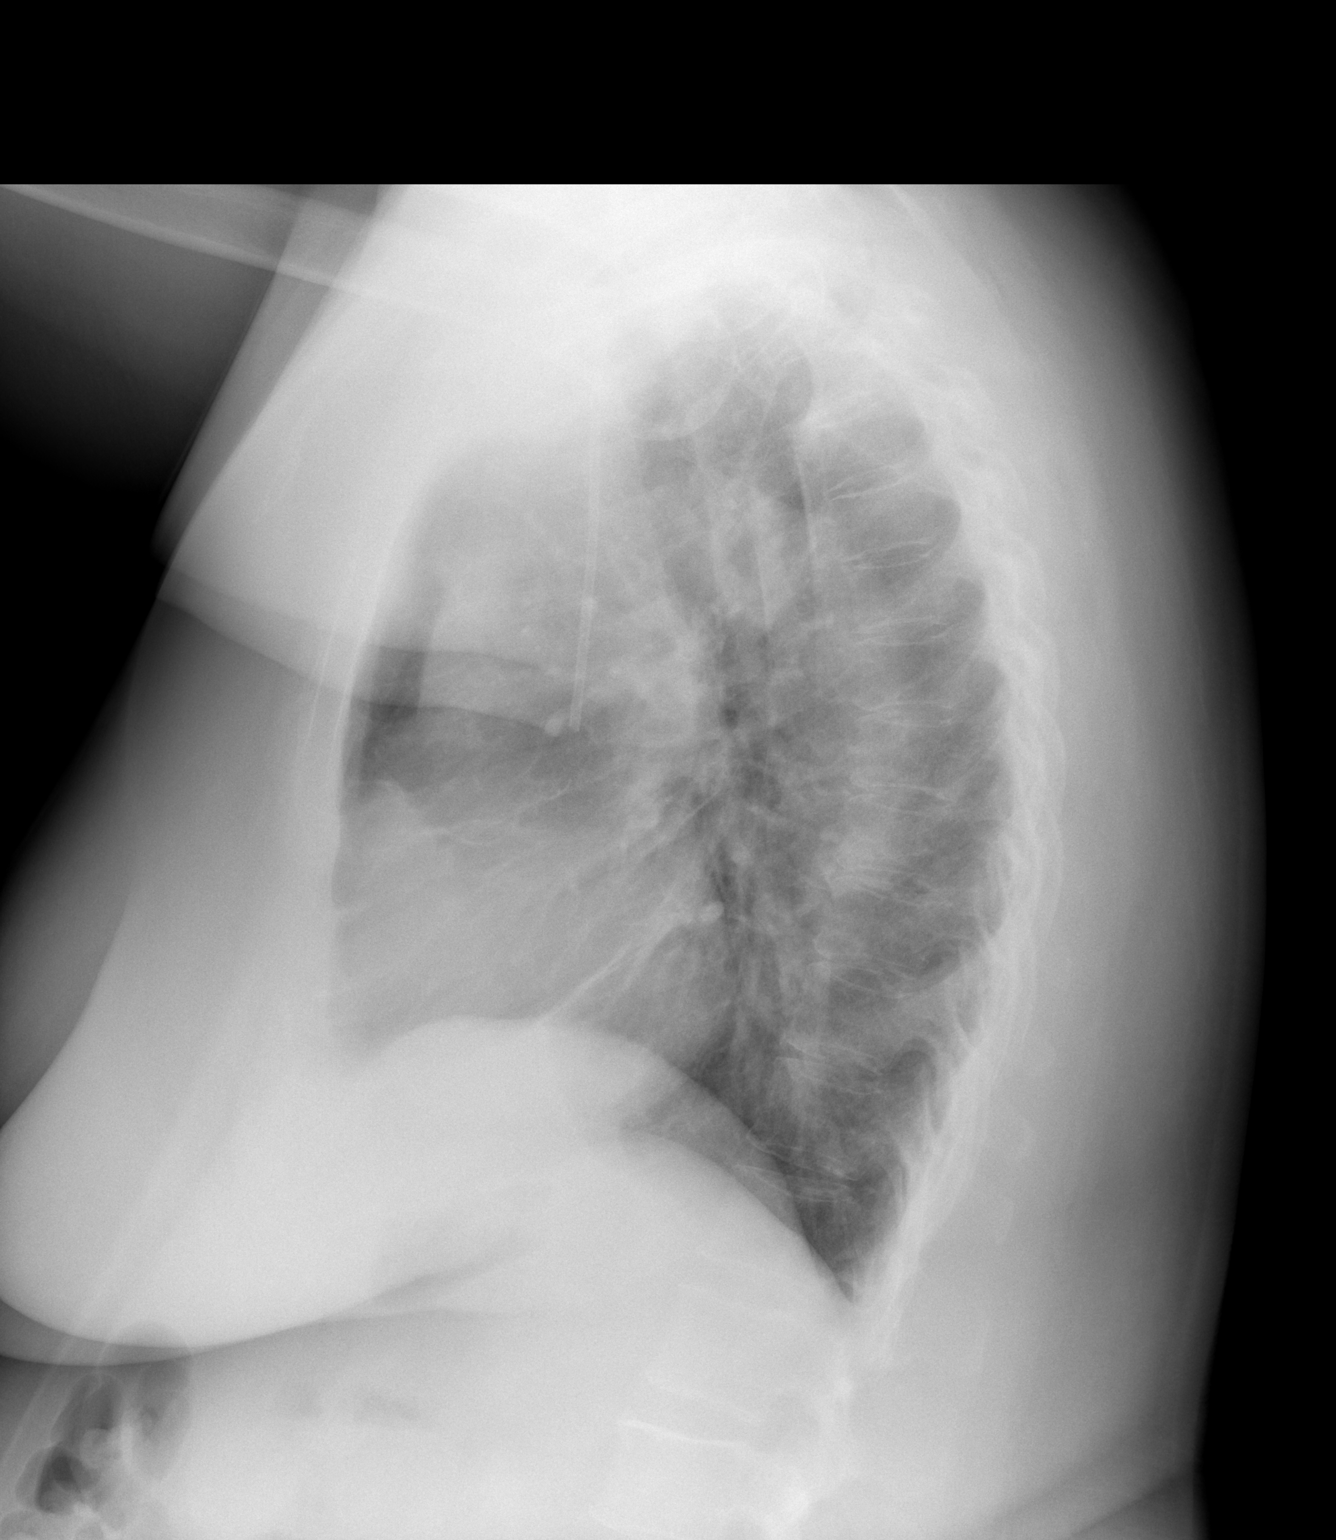

[2 of 2 positions shown; findings below may reference images not displayed]

FINDINGS: No focal infiltrate or effusion is seen with the lung bases somewhat
under aerated. No mediastinal or hilar adenopathy is evident. A
right-sided Port-A-Cath is present with the tip seen to the lower
SVC. Mild cardiomegaly is stable. There are degenerative changes in
the lower thoracic spine.
IMPRESSION: 1. No active lung disease.
2. Mild cardiomegaly.
3. Port-A-Cath tip seen to lower SVC

## 2017-02-10 DIAGNOSIS — L600 Ingrowing nail: Secondary | ICD-10-CM | POA: Diagnosis not present

## 2017-03-28 ENCOUNTER — Other Ambulatory Visit (HOSPITAL_BASED_OUTPATIENT_CLINIC_OR_DEPARTMENT_OTHER): Payer: Medicare Other

## 2017-03-28 ENCOUNTER — Ambulatory Visit (HOSPITAL_BASED_OUTPATIENT_CLINIC_OR_DEPARTMENT_OTHER): Payer: Medicare Other

## 2017-03-28 ENCOUNTER — Ambulatory Visit: Payer: Medicare Other

## 2017-03-28 ENCOUNTER — Ambulatory Visit (HOSPITAL_BASED_OUTPATIENT_CLINIC_OR_DEPARTMENT_OTHER): Payer: Medicare Other | Admitting: Hematology & Oncology

## 2017-03-28 VITALS — BP 155/89 | HR 51 | Temp 98.1°F | Resp 14

## 2017-03-28 DIAGNOSIS — C9110 Chronic lymphocytic leukemia of B-cell type not having achieved remission: Secondary | ICD-10-CM

## 2017-03-28 DIAGNOSIS — E119 Type 2 diabetes mellitus without complications: Secondary | ICD-10-CM | POA: Diagnosis not present

## 2017-03-28 DIAGNOSIS — Z5112 Encounter for antineoplastic immunotherapy: Secondary | ICD-10-CM | POA: Diagnosis present

## 2017-03-28 DIAGNOSIS — C9190 Lymphoid leukemia, unspecified not having achieved remission: Secondary | ICD-10-CM | POA: Diagnosis not present

## 2017-03-28 LAB — CMP (CANCER CENTER ONLY)
ALBUMIN: 3.6 g/dL (ref 3.3–5.5)
ALK PHOS: 79 U/L (ref 26–84)
ALT(SGPT): 19 U/L (ref 10–47)
AST: 23 U/L (ref 11–38)
BUN, Bld: 12 mg/dL (ref 7–22)
CHLORIDE: 101 meq/L (ref 98–108)
CO2: 30 mEq/L (ref 18–33)
CREATININE: 1.2 mg/dL (ref 0.6–1.2)
Calcium: 11.2 mg/dL — ABNORMAL HIGH (ref 8.0–10.3)
Glucose, Bld: 287 mg/dL — ABNORMAL HIGH (ref 73–118)
Potassium: 3.8 mEq/L (ref 3.3–4.7)
SODIUM: 138 meq/L (ref 128–145)
TOTAL PROTEIN: 6 g/dL — AB (ref 6.4–8.1)
Total Bilirubin: 0.8 mg/dl (ref 0.20–1.60)

## 2017-03-28 LAB — CBC WITH DIFFERENTIAL (CANCER CENTER ONLY)
HCT: 31.1 % — ABNORMAL LOW (ref 34.8–46.6)
HEMOGLOBIN: 10.6 g/dL — AB (ref 11.6–15.9)
MCH: 25.9 pg — AB (ref 26.0–34.0)
MCHC: 34.1 g/dL (ref 32.0–36.0)
MCV: 76 fL — AB (ref 81–101)
Platelets: 175 10*3/uL (ref 145–400)
RBC: 4.1 10*6/uL (ref 3.70–5.32)
RDW: 15 % (ref 11.1–15.7)
WBC: 3.6 10*3/uL — ABNORMAL LOW (ref 3.9–10.0)

## 2017-03-28 LAB — MANUAL DIFFERENTIAL (CHCC SATELLITE)
ALC: 0.9 10*3/uL (ref 0.6–2.2)
ANC (CHCC MAN DIFF): 2 10*3/uL (ref 1.5–6.7)
BASO: 1 % (ref 0–2)
Band Neutrophils: 3 % (ref 0–10)
EOS: 5 % (ref 0–7)
LYMPH: 25 % (ref 14–48)
MONO: 15 % — AB (ref 0–13)
PLT EST ~~LOC~~: ADEQUATE
SEG: 51 % (ref 40–75)

## 2017-03-28 MED ORDER — FAMOTIDINE IN NACL 20-0.9 MG/50ML-% IV SOLN
40.0000 mg | Freq: Once | INTRAVENOUS | Status: AC
  Administered 2017-03-28: 40 mg via INTRAVENOUS

## 2017-03-28 MED ORDER — IBUPROFEN 200 MG PO TABS
ORAL_TABLET | ORAL | Status: AC
  Filled 2017-03-28: qty 2

## 2017-03-28 MED ORDER — OBINUTUZUMAB CHEMO INJECTION 1000 MG/40ML
1000.0000 mg | Freq: Once | INTRAVENOUS | Status: AC
  Administered 2017-03-28: 1000 mg via INTRAVENOUS
  Filled 2017-03-28: qty 40

## 2017-03-28 MED ORDER — SODIUM CHLORIDE 0.9 % IV SOLN
Freq: Once | INTRAVENOUS | Status: AC
  Administered 2017-03-28: 09:00:00 via INTRAVENOUS

## 2017-03-28 MED ORDER — SODIUM CHLORIDE 0.9 % IJ SOLN
10.0000 mL | INTRAMUSCULAR | Status: DC | PRN
  Administered 2017-03-28: 10 mL
  Filled 2017-03-28: qty 10

## 2017-03-28 MED ORDER — DIPHENHYDRAMINE HCL 50 MG/ML IJ SOLN
50.0000 mg | Freq: Once | INTRAMUSCULAR | Status: AC
  Administered 2017-03-28: 50 mg via INTRAVENOUS

## 2017-03-28 MED ORDER — INSULIN REGULAR HUMAN 100 UNIT/ML IJ SOLN
15.0000 [IU] | Freq: Once | INTRAMUSCULAR | Status: AC
  Administered 2017-03-28: 15 [IU] via SUBCUTANEOUS

## 2017-03-28 MED ORDER — HEPARIN SOD (PORK) LOCK FLUSH 100 UNIT/ML IV SOLN
500.0000 [IU] | Freq: Once | INTRAVENOUS | Status: AC | PRN
  Administered 2017-03-28: 500 [IU]
  Filled 2017-03-28: qty 5

## 2017-03-28 MED ORDER — FAMOTIDINE IN NACL 20-0.9 MG/50ML-% IV SOLN
INTRAVENOUS | Status: AC
  Filled 2017-03-28: qty 100

## 2017-03-28 MED ORDER — IBUPROFEN 200 MG PO TABS
400.0000 mg | ORAL_TABLET | Freq: Once | ORAL | Status: AC
  Administered 2017-03-28: 400 mg via ORAL

## 2017-03-28 MED ORDER — DIPHENHYDRAMINE HCL 50 MG/ML IJ SOLN
INTRAMUSCULAR | Status: AC
  Filled 2017-03-28: qty 1

## 2017-03-28 NOTE — Patient Instructions (Signed)
Implanted Port Home Guide An implanted port is a type of central line that is placed under the skin. Central lines are used to provide IV access when treatment or nutrition needs to be given through a person's veins. Implanted ports are used for long-term IV access. An implanted port may be placed because:  You need IV medicine that would be irritating to the small veins in your hands or arms.  You need long-term IV medicines, such as antibiotics.  You need IV nutrition for a long period.  You need frequent blood draws for lab tests.  You need dialysis.  Implanted ports are usually placed in the chest area, but they can also be placed in the upper arm, the abdomen, or the leg. An implanted port has two main parts:  Reservoir. The reservoir is round and will appear as a small, raised area under your skin. The reservoir is the part where a needle is inserted to give medicines or draw blood.  Catheter. The catheter is a thin, flexible tube that extends from the reservoir. The catheter is placed into a large vein. Medicine that is inserted into the reservoir goes into the catheter and then into the vein.  How will I care for my incision site? Do not get the incision site wet. Bathe or shower as directed by your health care provider. How is my port accessed? Special steps must be taken to access the port:  Before the port is accessed, a numbing cream can be placed on the skin. This helps numb the skin over the port site.  Your health care provider uses a sterile technique to access the port. ? Your health care provider must put on a mask and sterile gloves. ? The skin over your port is cleaned carefully with an antiseptic and allowed to dry. ? The port is gently pinched between sterile gloves, and a needle is inserted into the port.  Only "non-coring" port needles should be used to access the port. Once the port is accessed, a blood return should be checked. This helps ensure that the port  is in the vein and is not clogged.  If your port needs to remain accessed for a constant infusion, a clear (transparent) bandage will be placed over the needle site. The bandage and needle will need to be changed every week, or as directed by your health care provider.  Keep the bandage covering the needle clean and dry. Do not get it wet. Follow your health care provider's instructions on how to take a shower or bath while the port is accessed.  If your port does not need to stay accessed, no bandage is needed over the port.  What is flushing? Flushing helps keep the port from getting clogged. Follow your health care provider's instructions on how and when to flush the port. Ports are usually flushed with saline solution or a medicine called heparin. The need for flushing will depend on how the port is used.  If the port is used for intermittent medicines or blood draws, the port will need to be flushed: ? After medicines have been given. ? After blood has been drawn. ? As part of routine maintenance.  If a constant infusion is running, the port may not need to be flushed.  How long will my port stay implanted? The port can stay in for as long as your health care provider thinks it is needed. When it is time for the port to come out, surgery will be   done to remove it. The procedure is similar to the one performed when the port was put in. When should I seek immediate medical care? When you have an implanted port, you should seek immediate medical care if:  You notice a bad smell coming from the incision site.  You have swelling, redness, or drainage at the incision site.  You have more swelling or pain at the port site or the surrounding area.  You have a fever that is not controlled with medicine.  This information is not intended to replace advice given to you by your health care provider. Make sure you discuss any questions you have with your health care provider. Document  Released: 09/17/2005 Document Revised: 02/23/2016 Document Reviewed: 05/25/2013 Elsevier Interactive Patient Education  2017 [employer]vier Inc[employer]  

## 2017-03-28 NOTE — Progress Notes (Signed)
Hematology and Oncology Follow Up Visit  Megan Tate [id number] August 16, 1946 71 y.o. 03/28/2017   Principle Diagnosis:  Chronic lymphocytic leukemia- Trisomy 12  Current Therapy:   Status post 6 cycles of Gazyva/Bendamustine - completed4/2016 Maintenance Gazyva every 3 months s/p cycle #11 - to complete 2 years in June 2018    Interim History:  Megan Tate is back for follow-up. She seems be doing pretty well.she really has no complaints. She did undergo colonoscopy. Everything looked okay. There is no malignancy.  She had her 71th birthday 2 weeks ago. She was down in [facility]. She really had a good time.  Her blood sugars remain on the higher side. This is was been the way for her. Not sure how aggressive she really is in monitoring this. I told her that if her blood sugars do not get better, she will run into permanent problems that will really make life difficult for her.  She's had no issues with infections. She's had no problems with with cough or shortness of breath. There's been no leg swelling. She's had no rashes.   Overall, her performance status is ECOG 1.   Medications:  Allergies as of 03/28/2017      Reactions   Tylenol [acetaminophen] Other (See Comments)   Messes with stomach      Medication List       Accurate as of 03/28/17  8:34 AM. Always use your most recent med list.          cloNIDine 0.2 MG tablet Commonly known as:  CATAPRES Take 0.2 mg by mouth 2 (two) times daily.   dorzolamide-timolol 22.3-6.8 MG/ML ophthalmic solution Commonly known as:  COSOPT Place 1 drop into both eyes 2 (two) times daily.   dorzolamide-timolol 22.3-6.8 MG/ML ophthalmic solution Commonly known as:  COSOPT INSTILL ONE DROP INTO AFFECTED EYE TWICE DAILY   doxepin 10 MG capsule Commonly known as:  SINEQUAN Take 1 capsule by mouth daily.   esomeprazole 20 MG capsule Commonly known as:  NEXIUM Take 1 capsule (20 mg total) by mouth 2 (two) times daily before a  meal.   fluconazole 200 MG tablet Commonly known as:  DIFLUCAN Take 1 tablet (200 mg total) by mouth daily.   FUSION PLUS Caps Take 1 capsule by mouth every morning.   glucose blood test strip Monitor blood glucose daily before every meal and at bedtime   JANUMET XR 50-1000 MG Tb24 Generic drug:  SitaGLIPtin-MetFORMIN HCl Take 2 tablets by mouth daily.   LEVEMIR FLEXTOUCH 100 UNIT/ML Pen Generic drug:  Insulin Detemir Inject 10 Units into the skin every morning.   lidocaine-prilocaine cream Commonly known as:  EMLA Apply to Port-A-Cath site 1 hour ride to treatment   LINZESS 145 MCG Caps capsule Generic drug:  linaclotide Take 1 capsule by mouth daily.   losartan 100 MG tablet Commonly known as:  COZAAR Take 100 mg by mouth daily.   metoprolol succinate 100 MG 24 hr tablet Commonly known as:  TOPROL-XL Take 100 mg by mouth daily with lunch.   ONE TOUCH ULTRA SYSTEM KIT w/Device Kit 1 kit by Does not apply route once. Check blood sugar daily before meals and at bedtime.   orphenadrine 100 MG tablet Commonly known as:  NORFLEX TAKE 1 TABLET (100 MG TOTAL) BY MOUTH 2 (TWO) TIMES DAILY AS NEEDED FOR MUSCLE SPASMS.   rosuvastatin 20 MG tablet Commonly known as:  CRESTOR Take 20 mg by mouth daily.   spironolactone 25 MG tablet Commonly  known as:  ALDACTONE TAKE ONE TABLET BY MOUTH ONE TIME DAILY       Allergies:  Allergies  Allergen Reactions  . Tylenol [Acetaminophen] Other (See Comments)    Messes with stomach    Past Medical History, Surgical history, Social history, and Family History were reviewed and updated.  Review of Systems: All other 10 point review of systems is negative.   Physical Exam:  vitals were not taken for this visit.  Wt Readings from Last 3 Encounters:  03/28/17 213 lb (96.6 kg)  01/28/17 211 lb (95.7 kg)  11/28/16 209 lb 8 oz (95 kg)     well-developed and well-nourished African-American female in no obvious distress. Head  and neck exam shows no ocular or oral lesions. She has no adenopathy in the neck. Lungs are clear. Cardiac exam regular rate and rhythm with no murmurs, rubs or bruits. Abdomen is soft. She has no fluid wave. There is no palpable liver or spleen tip. Axillary exam shows no bilateral axillary adenopathy. Back exam shows no tenderness over the spine, ribs or hips. Extremities shows no clubbing, cyanosis or edema. Neurological exam shows no focal neurological deficits. Skin exam shows no rashes, ecchymoses or petechia. Lab Results  Component Value Date   WBC 3.5 (L) 01/28/2017   HGB 10.5 (L) 01/28/2017   HCT 30.9 (L) 01/28/2017   MCV 76 (L) 01/28/2017   PLT 162 01/28/2017   Lab Results  Component Value Date   FERRITIN 214 11/28/2016   IRON 103 11/28/2016   TIBC 318 11/28/2016   UIBC 214 11/28/2016   IRONPCTSAT 33 11/28/2016   Lab Results  Component Value Date   RETICCTPCT 1.2 04/07/2014   RBC 4.07 01/28/2017   RETICCTABS 65.4 04/07/2014   Lab Results  Component Value Date   KAPLAMBRATIO 2.06 (H) 01/28/2017   Lab Results  Component Value Date   IGGSERUM 516 (L) 01/28/2017   IGA 41 (L) 03/30/2015   IGMSERUM <5 (L) 01/28/2017   Lab Results  Component Value Date   TOTALPROTELP 5.5 (L) 03/30/2015   ALBUMINELP 3.4 (L) 03/30/2015   A1GS 0.3 03/30/2015   A2GS 0.8 03/30/2015   BETS 0.4 03/30/2015   BETA2SER 0.2 03/30/2015   GAMS 0.5 (L) 03/30/2015   MSPIKE Not Observed 01/28/2017   SPEI * 03/30/2015     Chemistry      Component Value Date/Time   NA 138 01/28/2017 0818   K 3.7 01/28/2017 0818   CL 102 01/28/2017 0818   CO2 28 01/28/2017 0818   BUN 16 01/28/2017 0818   CREATININE 1.2 01/28/2017 0818      Component Value Date/Time   CALCIUM 11.0 (H) 01/28/2017 0818   ALKPHOS 88 (H) 01/28/2017 0818   AST 21 01/28/2017 0818   ALT 15 01/28/2017 0818   BILITOT 0.80 01/28/2017 0818     Impression and Plan: Megan Tate is 71 year old African-American female with chronic  lymphocytic leukemia. She completed treatment with Gazyva and Bendamustine in April 2016 and is now on Maintenance Gazyva.    She is doing great. She has had no problems with infections.  This will be her last treatment for Gazyva. She has done very well.   She had her 71th birthday 2 weeks ago. She went down to [facility] for this.  As always, her blood sugars will be her biggest problem. We will have to give her a little insulin today.  We will plan to get her back in 3 months.Marland Kitchen  Volanda Napoleon, MD 6/28/20188:34 AM

## 2017-03-28 NOTE — Patient Instructions (Signed)
Obinutuzumab injection What is this medicine? OBINUTUZUMAB (OH bi nue TOOZ ue mab) is a monoclonal antibody. It is used to treat chronic lymphocytic leukemia (CLL) and a type of non-Hodgkin lymphoma (NHL), follicular lymphoma. This medicine may be used for other purposes; ask your health care provider or pharmacist if you have questions. COMMON BRAND NAME(S): GAZYVA What should I tell my health care provider before I take this medicine? They need to know if you have any of these conditions: -infection (especially a virus infection such as hepatitis B virus) -lung or breathing disease -heart disease -take medicines that treat or prevent blood clots -an unusual or allergic reaction to obinutuzumab, other medicines, foods, dyes, or preservatives -pregnant or trying to get pregnant -breast-feeding How should I use this medicine? This medicine is for infusion into a vein. It is given by a health care professional in a hospital or clinic setting. Talk to your pediatrician regarding the use of this medicine in children. Special care may be needed. Overdosage: If you think you have taken too much of this medicine contact a poison control center or emergency room at once. NOTE: This medicine is only for you. Do not share this medicine with others. What if I miss a dose? Keep appointments for follow-up doses as directed. It is important not to miss your dose. Call your doctor or health care professional if you are unable to keep an appointment. What may interact with this medicine? -live virus vaccines This list may not describe all possible interactions. Give your health care provider a list of all the medicines, herbs, non-prescription drugs, or dietary supplements you use. Also tell them if you smoke, drink alcohol, or use illegal drugs. Some items may interact with your medicine. What should I watch for while using this medicine? Report any side effects that you notice during your treatment right  away, such as changes in your breathing, fever, chills, dizziness or lightheadedness. These effects are more common with the first dose. Visit your prescriber or health care professional for checks on your progress. You will need to have regular blood work. Report any other side effects. The side effects of this medicine can continue after you finish your treatment. Continue your course of treatment even though you feel ill unless your doctor tells you to stop. Call your doctor or health care professional for advice if you get a fever, chills or sore throat, or other symptoms of a cold or flu. Do not treat yourself. This drug decreases your body's ability to fight infections. Try to avoid being around people who are sick. This medicine may increase your risk to bruise or bleed. Call your doctor or health care professional if you notice any unusual bleeding. What side effects may I notice from receiving this medicine? Side effects that you should report to your doctor or health care professional as soon as possible: -allergic reactions like skin rash, itching or hives, swelling of the face, lips, or tongue -breathing problems -changes in vision -chest pain or chest tightness -confusion -dizziness -loss of balance or coordination -low blood counts - this medicine may decrease the number of white blood cells, red blood cells and platelets. You may be at increased risk for infections and bleeding. -signs of decreased platelets or bleeding - bruising, pinpoint red spots on the skin, black, tarry stools, blood in the urine -signs of infection - fever or chills, cough, sore throat, pain or trouble passing urine -signs and symptoms of liver injury like dark yellow or   brown urine; general ill feeling or flu-like symptoms; light-colored stools; loss of appetite; nausea; right upper belly pain; unusually weak or tired; yellowing of the eyes or skin -trouble speaking or understanding -trouble  walking -vomiting Side effects that usually do not require medical attention (report to your doctor or health care professional if they continue or are bothersome): -constipation -joint pain -muscle pain This list may not describe all possible side effects. Call your doctor for medical advice about side effects. You may report side effects to FDA at 1-800-FDA-1088. Where should I keep my medicine? This drug is only given in a hospital or clinic and will not be stored at home. NOTE: This sheet is a summary. It may not cover all possible information. If you have questions about this medicine, talk to your doctor, pharmacist, or health care provider.  2018 Elsevier/Gold Standard (2015-10-20 08:54:03)  

## 2017-03-29 LAB — PROTEIN ELECTROPHORESIS, SERUM
A/G Ratio: 1.4 (ref 0.7–1.7)
ALBUMIN: 3.4 g/dL (ref 2.9–4.4)
Alpha 1: 0.2 g/dL (ref 0.0–0.4)
Alpha 2: 0.8 g/dL (ref 0.4–1.0)
Beta: 0.8 g/dL (ref 0.7–1.3)
Gamma Globulin: 0.5 g/dL (ref 0.4–1.8)
Globulin, Total: 2.4 g/dL (ref 2.2–3.9)
TOTAL PROTEIN: 5.8 g/dL — AB (ref 6.0–8.5)

## 2017-03-29 LAB — KAPPA/LAMBDA LIGHT CHAINS
IG KAPPA FREE LIGHT CHAIN: 11.3 mg/L (ref 3.3–19.4)
IG LAMBDA FREE LIGHT CHAIN: 7.1 mg/L (ref 5.7–26.3)
Kappa/Lambda FluidC Ratio: 1.59 (ref 0.26–1.65)

## 2017-03-29 LAB — IGG, IGA, IGM
IgA, Qn, Serum: 32 mg/dL — ABNORMAL LOW (ref 87–352)
IgG, Qn, Serum: 539 mg/dL — ABNORMAL LOW (ref 700–1600)
IgM, Qn, Serum: <5 mg/dL — ABNORMAL LOW (ref 26–217)

## 2017-04-29 DIAGNOSIS — N390 Urinary tract infection, site not specified: Secondary | ICD-10-CM | POA: Diagnosis not present

## 2017-04-29 DIAGNOSIS — A499 Bacterial infection, unspecified: Secondary | ICD-10-CM | POA: Diagnosis not present

## 2017-04-29 DIAGNOSIS — R3915 Urgency of urination: Secondary | ICD-10-CM | POA: Diagnosis not present

## 2017-05-03 ENCOUNTER — Other Ambulatory Visit: Payer: Self-pay

## 2017-05-03 DIAGNOSIS — D500 Iron deficiency anemia secondary to blood loss (chronic): Secondary | ICD-10-CM

## 2017-05-03 DIAGNOSIS — C9110 Chronic lymphocytic leukemia of B-cell type not having achieved remission: Secondary | ICD-10-CM

## 2017-05-03 MED ORDER — FUSION PLUS PO CAPS: 1.0000 | ORAL_CAPSULE | Freq: Every morning | ORAL | 4 refills | Status: DC

## 2017-05-09 ENCOUNTER — Ambulatory Visit (HOSPITAL_BASED_OUTPATIENT_CLINIC_OR_DEPARTMENT_OTHER): Payer: Medicare Other

## 2017-05-09 VITALS — BP 159/70 | HR 56 | Temp 98.1°F | Resp 18

## 2017-05-09 DIAGNOSIS — C9110 Chronic lymphocytic leukemia of B-cell type not having achieved remission: Secondary | ICD-10-CM | POA: Diagnosis not present

## 2017-05-09 DIAGNOSIS — Z452 Encounter for adjustment and management of vascular access device: Secondary | ICD-10-CM

## 2017-05-09 MED ORDER — HEPARIN SOD (PORK) LOCK FLUSH 100 UNIT/ML IV SOLN
500.0000 [IU] | Freq: Once | INTRAVENOUS | Status: AC
  Administered 2017-05-09: 500 [IU] via INTRAVENOUS
  Filled 2017-05-09: qty 5

## 2017-05-09 MED ORDER — SODIUM CHLORIDE 0.9% FLUSH
10.0000 mL | INTRAVENOUS | Status: DC | PRN
  Administered 2017-05-09: 10 mL via INTRAVENOUS
  Filled 2017-05-09: qty 10

## 2017-05-09 NOTE — Patient Instructions (Signed)
Implanted Port Home Guide An implanted port is a type of central line that is placed under the skin. Central lines are used to provide IV access when treatment or nutrition needs to be given through a person's veins. Implanted ports are used for long-term IV access. An implanted port may be placed because:  You need IV medicine that would be irritating to the small veins in your hands or arms.  You need long-term IV medicines, such as antibiotics.  You need IV nutrition for a long period.  You need frequent blood draws for lab tests.  You need dialysis.  Implanted ports are usually placed in the chest area, but they can also be placed in the upper arm, the abdomen, or the leg. An implanted port has two main parts:  Reservoir. The reservoir is round and will appear as a small, raised area under your skin. The reservoir is the part where a needle is inserted to give medicines or draw blood.  Catheter. The catheter is a thin, flexible tube that extends from the reservoir. The catheter is placed into a large vein. Medicine that is inserted into the reservoir goes into the catheter and then into the vein.  How will I care for my incision site? Do not get the incision site wet. Bathe or shower as directed by your health care provider. How is my port accessed? Special steps must be taken to access the port:  Before the port is accessed, a numbing cream can be placed on the skin. This helps numb the skin over the port site.  Your health care provider uses a sterile technique to access the port. ? Your health care provider must put on a mask and sterile gloves. ? The skin over your port is cleaned carefully with an antiseptic and allowed to dry. ? The port is gently pinched between sterile gloves, and a needle is inserted into the port.  Only "non-coring" port needles should be used to access the port. Once the port is accessed, a blood return should be checked. This helps ensure that the port  is in the vein and is not clogged.  If your port needs to remain accessed for a constant infusion, a clear (transparent) bandage will be placed over the needle site. The bandage and needle will need to be changed every week, or as directed by your health care provider.  Keep the bandage covering the needle clean and dry. Do not get it wet. Follow your health care provider's instructions on how to take a shower or bath while the port is accessed.  If your port does not need to stay accessed, no bandage is needed over the port.  What is flushing? Flushing helps keep the port from getting clogged. Follow your health care provider's instructions on how and when to flush the port. Ports are usually flushed with saline solution or a medicine called heparin. The need for flushing will depend on how the port is used.  If the port is used for intermittent medicines or blood draws, the port will need to be flushed: ? After medicines have been given. ? After blood has been drawn. ? As part of routine maintenance.  If a constant infusion is running, the port may not need to be flushed.  How long will my port stay implanted? The port can stay in for as long as your health care provider thinks it is needed. When it is time for the port to come out, surgery will be   done to remove it. The procedure is similar to the one performed when the port was put in. When should I seek immediate medical care? When you have an implanted port, you should seek immediate medical care if:  You notice a bad smell coming from the incision site.  You have swelling, redness, or drainage at the incision site.  You have more swelling or pain at the port site or the surrounding area.  You have a fever that is not controlled with medicine.  This information is not intended to replace advice given to you by your health care provider. Make sure you discuss any questions you have with your health care provider. Document  Released: 09/17/2005 Document Revised: 02/23/2016 Document Reviewed: 05/25/2013 Elsevier Interactive Patient Education  2017 [employer]vier Inc[employer]  

## 2017-05-17 ENCOUNTER — Telehealth: Payer: Self-pay | Admitting: *Deleted

## 2017-05-17 NOTE — Telephone Encounter (Signed)
Patient calling the office wanting a refill for her metoprolol. Reviewed the chart and Dr Marin Olp has never prescribed patient this medication. Informed patient that she would need to request refill from her prescribing physician. She understood.

## 2017-06-20 ENCOUNTER — Other Ambulatory Visit (HOSPITAL_BASED_OUTPATIENT_CLINIC_OR_DEPARTMENT_OTHER): Payer: Medicare Other

## 2017-06-20 ENCOUNTER — Ambulatory Visit: Payer: Medicare Other

## 2017-06-20 ENCOUNTER — Ambulatory Visit (HOSPITAL_BASED_OUTPATIENT_CLINIC_OR_DEPARTMENT_OTHER): Payer: Medicare Other | Admitting: Hematology & Oncology

## 2017-06-20 VITALS — BP 180/89 | HR 59 | Temp 98.5°F | Resp 18 | Wt 215.2 lb

## 2017-06-20 DIAGNOSIS — M25551 Pain in right hip: Secondary | ICD-10-CM

## 2017-06-20 DIAGNOSIS — E119 Type 2 diabetes mellitus without complications: Secondary | ICD-10-CM

## 2017-06-20 DIAGNOSIS — C9110 Chronic lymphocytic leukemia of B-cell type not having achieved remission: Secondary | ICD-10-CM

## 2017-06-20 DIAGNOSIS — Z95828 Presence of other vascular implants and grafts: Secondary | ICD-10-CM

## 2017-06-20 DIAGNOSIS — Z1231 Encounter for screening mammogram for malignant neoplasm of breast: Secondary | ICD-10-CM

## 2017-06-20 LAB — CMP (CANCER CENTER ONLY)
ALBUMIN: 3.7 g/dL (ref 3.3–5.5)
ALT: 22 U/L (ref 10–47)
AST: 25 U/L (ref 11–38)
Alkaline Phosphatase: 96 U/L — ABNORMAL HIGH (ref 26–84)
BILIRUBIN TOTAL: 0.7 mg/dL (ref 0.20–1.60)
BUN: 16 mg/dL (ref 7–22)
CO2: 30 mEq/L (ref 18–33)
CREATININE: 1.6 mg/dL — AB (ref 0.6–1.2)
Calcium: 11.3 mg/dL — ABNORMAL HIGH (ref 8.0–10.3)
Chloride: 100 mEq/L (ref 98–108)
Glucose, Bld: 251 mg/dL — ABNORMAL HIGH (ref 73–118)
Potassium: 4.3 mEq/L (ref 3.3–4.7)
SODIUM: 139 meq/L (ref 128–145)
TOTAL PROTEIN: 6.1 g/dL — AB (ref 6.4–8.1)

## 2017-06-20 LAB — CBC WITH DIFFERENTIAL (CANCER CENTER ONLY)
HEMATOCRIT: 31.2 % — AB (ref 34.8–46.6)
HGB: 10.6 g/dL — ABNORMAL LOW (ref 11.6–15.9)
MCH: 25.7 pg — ABNORMAL LOW (ref 26.0–34.0)
MCHC: 34 g/dL (ref 32.0–36.0)
MCV: 76 fL — AB (ref 81–101)
Platelets: 175 10*3/uL (ref 145–400)
RBC: 4.12 10*6/uL (ref 3.70–5.32)
RDW: 14.7 % (ref 11.1–15.7)
WBC: 3.4 10*3/uL — ABNORMAL LOW (ref 3.9–10.0)

## 2017-06-20 LAB — MANUAL DIFFERENTIAL (CHCC SATELLITE)
ALC: 1.4 10*3/uL (ref 0.6–2.2)
ANC (CHCC HP manual diff): 1.5 10*3/uL (ref 1.5–6.7)
BASO: 1 % (ref 0–2)
EOS: 4 % (ref 0–7)
LYMPH: 40 % (ref 14–48)
MONO: 12 % (ref 0–13)
PLT EST ~~LOC~~: ADEQUATE
SEG: 43 % (ref 40–75)

## 2017-06-20 LAB — LACTATE DEHYDROGENASE: LDH: 155 U/L (ref 125–245)

## 2017-06-20 MED ORDER — SODIUM CHLORIDE 0.9% FLUSH
10.0000 mL | INTRAVENOUS | Status: DC | PRN
  Administered 2017-06-20: 10 mL via INTRAVENOUS
  Filled 2017-06-20: qty 10

## 2017-06-20 MED ORDER — HEPARIN SOD (PORK) LOCK FLUSH 100 UNIT/ML IV SOLN
500.0000 [IU] | Freq: Once | INTRAVENOUS | Status: AC
  Administered 2017-06-20: 500 [IU] via INTRAVENOUS
  Filled 2017-06-20: qty 5

## 2017-06-20 NOTE — Progress Notes (Signed)
Hematology and Oncology Follow Up Visit  Glendine Swetz [id number] 1946-06-29 71 y.o. 06/20/2017   Principle Diagnosis:  Chronic lymphocytic leukemia- Trisomy 12  Current Therapy:   Status post 6 cycles of Gazyva/Bendamustine - completed4/2016 Maintenance Gazyva every 3 months s/p cycle #11 -  completed 2 years in June 2018    Interim History:  Ms. Chico is back for follow-up. As always, her blood sugars continue to be a huge problem. I'm not sure why she is not then aggressive in managing them.  This morning, her blood sugar was 251.  I told her that if her blood sugars are not better controlled, then she will end up on dialysis. Her creatinine is up today. Hopefully, this will motivate her to improve her blood sugar control.  She's complaining of pain in the right hip. This is been going on for a couple weeks. It hurts when she walks. We will get some plain films.  She sees her family doctor next week.  She's had no fever. She's had no bleeding. She's had no change in bowel or bladder habits. She's had no rashes. She's had no headache.  She and her husband really have not traveled this summer.  Overall, her performance status is ECOG 1.  Medications:  Allergies as of 06/20/2017      Reactions   Tylenol [acetaminophen] Other (See Comments)   Messes with stomach      Medication List       Accurate as of 06/20/17  9:03 AM. Always use your most recent med list.          cloNIDine 0.2 MG tablet Commonly known as:  CATAPRES Take 0.2 mg by mouth 2 (two) times daily.   dorzolamide-timolol 22.3-6.8 MG/ML ophthalmic solution Commonly known as:  COSOPT Place 1 drop into both eyes 2 (two) times daily.   dorzolamide-timolol 22.3-6.8 MG/ML ophthalmic solution Commonly known as:  COSOPT INSTILL ONE DROP INTO AFFECTED EYE TWICE DAILY   doxepin 10 MG capsule Commonly known as:  SINEQUAN Take 1 capsule by mouth daily.   esomeprazole 20 MG capsule Commonly known as:   NEXIUM Take 1 capsule (20 mg total) by mouth 2 (two) times daily before a meal.   fluconazole 200 MG tablet Commonly known as:  DIFLUCAN Take 1 tablet (200 mg total) by mouth daily.   FUSION PLUS Caps Take 1 capsule by mouth every morning.   glucose blood test strip Monitor blood glucose daily before every meal and at bedtime   JANUMET XR 50-1000 MG Tb24 Generic drug:  SitaGLIPtin-MetFORMIN HCl Take 2 tablets by mouth daily.   LEVEMIR FLEXTOUCH 100 UNIT/ML Pen Generic drug:  Insulin Detemir Inject 10 Units into the skin every morning.   lidocaine-prilocaine cream Commonly known as:  EMLA Apply to Port-A-Cath site 1 hour ride to treatment   LINZESS 145 MCG Caps capsule Generic drug:  linaclotide Take 1 capsule by mouth daily.   losartan 100 MG tablet Commonly known as:  COZAAR Take 100 mg by mouth daily.   metoprolol 200 MG 24 hr tablet Commonly known as:  TOPROL-XL   ONE TOUCH ULTRA SYSTEM KIT w/Device Kit 1 kit by Does not apply route once. Check blood sugar daily before meals and at bedtime.   orphenadrine 100 MG tablet Commonly known as:  NORFLEX TAKE 1 TABLET (100 MG TOTAL) BY MOUTH 2 (TWO) TIMES DAILY AS NEEDED FOR MUSCLE SPASMS.   rosuvastatin 20 MG tablet Commonly known as:  CRESTOR Take 20 mg by mouth  daily.   spironolactone 50 MG tablet Commonly known as:  ALDACTONE   sulfamethoxazole-trimethoprim 800-160 MG tablet Commonly known as:  BACTRIM DS,SEPTRA DS       Allergies:  Allergies  Allergen Reactions  . Tylenol [Acetaminophen] Other (See Comments)    Messes with stomach    Past Medical History, Surgical history, Social history, and Family History were reviewed and updated.  Review of Systems: As stated in the interim history  Physical Exam:  weight is 215 lb 4 oz (97.6 kg). Her oral temperature is 98.5 F (36.9 C). Her blood pressure is 180/89 (abnormal) and her pulse is 59 (abnormal). Her respiration is 18 and oxygen saturation is  100%.   Wt Readings from Last 3 Encounters:  06/20/17 215 lb 4 oz (97.6 kg)  03/28/17 213 lb (96.6 kg)  01/28/17 211 lb (95.7 kg)     Obese African-American female. Head and neck exam shows no ocular or oral lesions. She has no adenopathy in the neck. Lungs are clear. Cardiac exam regular rate and rhythm with no murmurs, rubs or bruits. Abdomen is soft. Abdomen is obese. She has no fluid wave. There is no palpable liver or spleen tip. Back exam shows no tenderness over the spine, ribs or hips. Extremities shows no clubbing, cyanosis or edema. She has some slight tenderness to palpation over the right hip. Skin exam shows no rashes, ecchymoses or petechia. Neurological exam is nonfocal. Lab Results  Component Value Date   WBC 3.6 (L) 03/28/2017   HGB 10.6 (L) 03/28/2017   HCT 31.1 (L) 03/28/2017   MCV 76 (L) 03/28/2017   PLT 175 03/28/2017   Lab Results  Component Value Date   FERRITIN 214 11/28/2016   IRON 103 11/28/2016   TIBC 318 11/28/2016   UIBC 214 11/28/2016   IRONPCTSAT 33 11/28/2016   Lab Results  Component Value Date   RETICCTPCT 1.2 04/07/2014   RBC 4.10 03/28/2017   RETICCTABS 65.4 04/07/2014   Lab Results  Component Value Date   KAPLAMBRATIO 1.59 03/28/2017   Lab Results  Component Value Date   IGGSERUM 539 (L) 03/28/2017   IGA 41 (L) 03/30/2015   IGMSERUM <5 (L) 03/28/2017   Lab Results  Component Value Date   TOTALPROTELP 5.5 (L) 03/30/2015   ALBUMINELP 3.4 (L) 03/30/2015   A1GS 0.3 03/30/2015   A2GS 0.8 03/30/2015   BETS 0.4 03/30/2015   BETA2SER 0.2 03/30/2015   GAMS 0.5 (L) 03/30/2015   MSPIKE Not Observed 03/28/2017   SPEI * 03/30/2015     Chemistry      Component Value Date/Time   NA 138 03/28/2017 0747   K 3.8 03/28/2017 0747   CL 101 03/28/2017 0747   CO2 30 03/28/2017 0747   BUN 12 03/28/2017 0747   CREATININE 1.2 03/28/2017 0747      Component Value Date/Time   CALCIUM 11.2 (H) 03/28/2017 0747   ALKPHOS 79 03/28/2017 0747   AST  23 03/28/2017 0747   ALT 19 03/28/2017 0747   BILITOT 0.80 03/28/2017 0747     Impression and Plan: Ms. Mcmahen is 71 year old African-American female with chronic lymphocytic leukemia. She completed treatment with Gazyva and Bendamustine in April 2016. She completed her maintenance Gazyva in June.  Again, diabetes will be her biggest long-term problem.  She's never had a mammogram. I will schedule one for her.  We will set of plain films of her right hip.  I realize that Ms.Ditton likes to take a "hands-off" approach to  her health. However, she is really putting herself at risk for permanent complications.  Hopefully, she will have a mammogram done.  She sees her family doctor next week.  I would like to see her back in 6 weeks.  She is somewhat anemic. I suspect this probably is from her diabetes. I may check another erythropoietin level on her.  I spent about 30 minutes talking with she and her husband. I explained to her the problems that she will have if her blood sugars are not corrected. She acknowledges this. She will try to improve upon this.   Volanda Napoleon, MD 9/20/20189:03 AM

## 2017-06-26 DIAGNOSIS — C9190 Lymphoid leukemia, unspecified not having achieved remission: Secondary | ICD-10-CM | POA: Diagnosis not present

## 2017-06-26 DIAGNOSIS — E1169 Type 2 diabetes mellitus with other specified complication: Secondary | ICD-10-CM | POA: Diagnosis not present

## 2017-06-26 DIAGNOSIS — E559 Vitamin D deficiency, unspecified: Secondary | ICD-10-CM | POA: Diagnosis not present

## 2017-06-26 DIAGNOSIS — I10 Essential (primary) hypertension: Secondary | ICD-10-CM | POA: Diagnosis not present

## 2017-06-26 DIAGNOSIS — E782 Mixed hyperlipidemia: Secondary | ICD-10-CM | POA: Diagnosis not present

## 2017-07-01 ENCOUNTER — Emergency Department (HOSPITAL_BASED_OUTPATIENT_CLINIC_OR_DEPARTMENT_OTHER)
Admission: EM | Admit: 2017-07-01 | Discharge: 2017-07-01 | Disposition: A | Discharge status: Home / Self Care | Payer: Medicare Other | Attending: Emergency Medicine | Admitting: Emergency Medicine

## 2017-07-01 ENCOUNTER — Encounter (HOSPITAL_BASED_OUTPATIENT_CLINIC_OR_DEPARTMENT_OTHER): Payer: Self-pay | Admitting: Emergency Medicine

## 2017-07-01 ENCOUNTER — Emergency Department (HOSPITAL_BASED_OUTPATIENT_CLINIC_OR_DEPARTMENT_OTHER): Payer: Medicare Other

## 2017-07-01 DIAGNOSIS — I10 Essential (primary) hypertension: Secondary | ICD-10-CM | POA: Insufficient documentation

## 2017-07-01 DIAGNOSIS — E119 Type 2 diabetes mellitus without complications: Secondary | ICD-10-CM | POA: Insufficient documentation

## 2017-07-01 DIAGNOSIS — M25551 Pain in right hip: Secondary | ICD-10-CM | POA: Diagnosis not present

## 2017-07-01 DIAGNOSIS — Z856 Personal history of leukemia: Secondary | ICD-10-CM | POA: Diagnosis not present

## 2017-07-01 DIAGNOSIS — Z79899 Other long term (current) drug therapy: Secondary | ICD-10-CM | POA: Diagnosis not present

## 2017-07-01 MED ORDER — METHYLPREDNISOLONE 4 MG PO TBPK: ORAL_TABLET | ORAL | 0 refills | Status: DC

## 2017-07-01 NOTE — ED Triage Notes (Signed)
Pt with right hip pain with radiation down the leg x1 month. Denies injury. Pain 8/10.

## 2017-07-01 NOTE — ED Notes (Signed)
Pt verbalized understanding of discharge instructions and denies any further questions at this time.   

## 2017-07-01 NOTE — ED Notes (Signed)
ED Provider at bedside. 

## 2017-07-01 NOTE — ED Provider Notes (Signed)
[facility] Provider Note   CSN: [id number] Arrival date & time: 07/01/17  0840     History   Chief Complaint Chief Complaint  Patient presents with  . Hip Pain    HPI Megan Tate is a 71 y.o. female.  HPI Patient presents with right hip pain. Has had over the last month. Worse with movements. Worse with walking. No weakness. No loss of bladder bowel control. No fevers. No abdominal pain. No fall. Pain is dull. Mild relief with ibuprofen. Past Medical History:  Diagnosis Date  . Cancer ([place]) CLL  . CLL (chronic lymphocytic leukemia) ([place]) 08/16/2014  . Diabetes mellitus without complication ([place])   . Hypertension     Patient Active Problem List   Diagnosis Date Noted  . Benign essential hypertension 02/04/2016  . 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 Controlled diabetes mellitus type II without complication ([place]) 93/71/6967  . Gout 02/04/2016  . Hypercalcemia 02/04/2016  . Obesity, morbid ([place]) 02/04/2016  . Swollen lymph nodes 02/04/2016  . Vaginal yeast infection 09/20/2014  . CLL (chronic lymphocytic leukemia) ([place]) 08/16/2014    History reviewed. No pertinent surgical history.  OB History    No data available       Home Medications    Prior to Admission medications   Medication Sig Start Date End Date Taking? Authorizing Provider  Blood Glucose Monitoring Suppl (ONE TOUCH ULTRA SYSTEM KIT) W/DEVICE KIT 1 kit by Does not apply route once. Check blood sugar daily before meals and at bedtime. 11/19/14   Volanda Napoleon, MD  cloNIDine (CATAPRES) 0.2 MG tablet Take 0.2 mg by mouth 2 (two) times daily.  02/04/14   [provider]  dorzolamide-timolol (COSOPT) 22.3-6.8 MG/ML ophthalmic solution Place 1 drop into both eyes 2 (two) times daily.  03/18/14   [provider]  dorzolamide-timolol (COSOPT) 22.3-6.8 MG/ML ophthalmic solution INSTILL ONE DROP INTO AFFECTED EYE TWICE DAILY 12/17/16   [provider]  doxepin  (SINEQUAN) 10 MG capsule Take 1 capsule by mouth daily. 09/07/16   [provider]  esomeprazole (NEXIUM) 20 MG capsule Take 1 capsule (20 mg total) by mouth 2 (two) times daily before a meal. 01/14/15   Ennever, Rudell Cobb, MD  fluconazole (DIFLUCAN) 200 MG tablet Take 1 tablet (200 mg total) by mouth daily. Patient not taking: Reported on 11/28/2016 09/13/16   Eliezer Bottom, NP  glucose blood test strip Monitor blood glucose daily before every meal and at bedtime 11/19/14   Volanda Napoleon, MD  Iron-FA-B Cmp-C-Biot-Probiotic (FUSION PLUS) CAPS Take 1 capsule by mouth every morning. 05/03/17   Volanda Napoleon, MD  JANUMET XR 50-1000 MG TB24 Take 2 tablets by mouth daily. 07/06/15   [provider]  LEVEMIR FLEXTOUCH 100 UNIT/ML Pen Inject 10 Units into the skin every morning.  03/10/14   [provider]  lidocaine-prilocaine (EMLA) cream Apply to Port-A-Cath site 1 hour ride to treatment 05/24/16   [place] [employer], Holli Humbles, NP  LINZESS 145 MCG CAPS capsule Take 1 capsule by mouth daily. 09/10/16   [provider]  losartan (COZAAR) 100 MG tablet Take 100 mg by mouth daily. 07/12/15   [provider]  methylPREDNISolone (MEDROL DOSEPAK) 4 MG TBPK tablet Use per package directions 07/01/17   Davonna Belling, MD  metoprolol (TOPROL-XL) 200 MG 24 hr tablet  05/17/17   [provider]  orphenadrine (NORFLEX) 100 MG tablet TAKE 1 TABLET (100 MG TOTAL) BY MOUTH 2 (TWO) TIMES DAILY AS NEEDED  FOR MUSCLE SPASMS. 12/29/15   Volanda Napoleon, MD  rosuvastatin (CRESTOR) 20 MG tablet Take 20 mg by mouth daily.    [provider]  spironolactone (ALDACTONE) 50 MG tablet  05/25/17   [provider]  sulfamethoxazole-trimethoprim (BACTRIM DS,SEPTRA DS) 800-160 MG tablet  04/29/17   [provider]    Family History No family history on file.  Social History Social History  Substance Use Topics  . Smoking status: Never Smoker  . Smokeless  tobacco: Never Used     Comment: never used tobacco  . Alcohol use Not on file     Allergies   Tylenol [acetaminophen]   Review of Systems Review of Systems  Constitutional: Negative for appetite change, chills and fever.  Respiratory: Negative for shortness of breath.   Cardiovascular: Negative for chest pain.  Gastrointestinal: Negative for abdominal pain.  Genitourinary: Negative for flank pain.  Musculoskeletal: Positive for back pain.  Skin: Negative for pallor and wound.  Neurological: Negative for numbness.  Psychiatric/Behavioral: Negative for confusion.     Physical Exam Updated Vital Signs BP (!) 199/86   Pulse (!) 55   Temp 98.5 F (36.9 C) (Oral)   Resp 20   Ht 5' 4" (1.626 m)   Wt 95.3 kg (210 lb)   SpO2 100%   BMI 36.05 kg/m   Physical Exam  Constitutional: She appears well-developed.  HENT:  Head: Atraumatic.  Neck: Neck supple.  Cardiovascular: Normal rate.   Pulmonary/Chest: Effort normal.  Abdominal: There is no tenderness.  Musculoskeletal: She exhibits no edema.  Mild tenderness in right SI joint area. Rather good straight leg raise bilaterally. No clonus. Good flexion extension at the ankles bilaterally. Sensation grossly intact over both feet. Pulses intact in feet. No lumbar tenderness. Good range of motion right hip.  Neurological: She is alert.  Skin: Skin is warm. Capillary refill takes less than 2 seconds.  Psychiatric: She has a normal mood and affect.     ED Treatments / Results  Labs (all labs ordered are listed, but only abnormal results are displayed) Labs Reviewed - No data to display  EKG  EKG Interpretation None       Radiology Dg Hip Unilat W Or Wo Pelvis 2-3 Views Right  Result Date: 07/01/2017 CLINICAL DATA:  Right hip pain for 1 month.  No injury. EXAM: DG HIP (WITH OR WITHOUT PELVIS) 2-3V RIGHT COMPARISON:  CT abdomen and pelvis dated November 24, 2014. FINDINGS: No acute fracture or malalignment. Mild joint  space narrowing and osteophyte formation of both hip joints, with areas of subchondral cystic change in both acetabula. Degenerative changes of the lumbar spine. Prominent joint space narrowing and subchondral sclerosis along the pubic symphysis. Multiple pelvic phleboliths. IMPRESSION: Mild bilateral hip joint degenerative changes. No acute osseous abnormality. Electronically Signed   By: Titus Dubin M.D.   On: 07/01/2017 09:34    Procedures Procedures (including critical care time)  Medications Ordered in ED Medications - No data to display   Initial Impression / Assessment and Plan / ED Course  I have reviewed the triage vital signs and the nursing notes.  Pertinent labs & imaging results that were available during my care of the patient were reviewed by me and considered in my medical decision making (see chart for details).     Patient with pain in her right SI joint area down her leg. No weakness. No loss of bladder bowel control. Only red flag his history of CLL. X-ray  of hip reassuring. No fevers. Will treat with Dosepak. Will need follow-up if symptoms do not improve. Discharge home.  Final Clinical Impressions(s) / ED Diagnoses   Final diagnoses:  Pain of right hip joint    New Prescriptions New Prescriptions   METHYLPREDNISOLONE (MEDROL DOSEPAK) 4 MG TBPK TABLET    Use per package directions     Davonna Belling, MD 07/01/17 1058

## 2017-07-10 ENCOUNTER — Other Ambulatory Visit: Payer: Self-pay | Admitting: *Deleted

## 2017-07-10 DIAGNOSIS — E119 Type 2 diabetes mellitus without complications: Secondary | ICD-10-CM

## 2017-07-11 DIAGNOSIS — H40053 Ocular hypertension, bilateral: Secondary | ICD-10-CM | POA: Diagnosis not present

## 2017-07-11 DIAGNOSIS — Z83511 Family history of glaucoma: Secondary | ICD-10-CM | POA: Diagnosis not present

## 2017-07-11 DIAGNOSIS — H04123 Dry eye syndrome of bilateral lacrimal glands: Secondary | ICD-10-CM | POA: Diagnosis not present

## 2017-07-22 ENCOUNTER — Other Ambulatory Visit: Payer: Self-pay | Admitting: Hematology & Oncology

## 2017-07-23 ENCOUNTER — Other Ambulatory Visit: Payer: Self-pay | Admitting: *Deleted

## 2017-07-23 DIAGNOSIS — C9110 Chronic lymphocytic leukemia of B-cell type not having achieved remission: Secondary | ICD-10-CM

## 2017-07-23 DIAGNOSIS — D500 Iron deficiency anemia secondary to blood loss (chronic): Secondary | ICD-10-CM

## 2017-07-23 MED ORDER — FUSION PLUS PO CAPS: 1.0000 | ORAL_CAPSULE | Freq: Every morning | ORAL | 4 refills | Status: DC

## 2017-07-29 ENCOUNTER — Other Ambulatory Visit: Payer: Self-pay | Admitting: *Deleted

## 2017-07-29 MED ORDER — TIZANIDINE HCL 2 MG PO TABS: 2.0000 mg | ORAL_TABLET | Freq: Three times a day (TID) | ORAL | 2 refills | Status: DC | PRN

## 2017-08-01 ENCOUNTER — Ambulatory Visit (HOSPITAL_BASED_OUTPATIENT_CLINIC_OR_DEPARTMENT_OTHER): Payer: Medicare Other | Admitting: Hematology & Oncology

## 2017-08-01 ENCOUNTER — Ambulatory Visit: Payer: Medicare Other

## 2017-08-01 ENCOUNTER — Other Ambulatory Visit (HOSPITAL_BASED_OUTPATIENT_CLINIC_OR_DEPARTMENT_OTHER): Payer: Medicare Other

## 2017-08-01 VITALS — BP 161/63 | HR 52 | Temp 98.5°F | Resp 18 | Wt 217.5 lb

## 2017-08-01 DIAGNOSIS — C9110 Chronic lymphocytic leukemia of B-cell type not having achieved remission: Secondary | ICD-10-CM

## 2017-08-01 DIAGNOSIS — Z95828 Presence of other vascular implants and grafts: Secondary | ICD-10-CM

## 2017-08-01 DIAGNOSIS — Z1231 Encounter for screening mammogram for malignant neoplasm of breast: Secondary | ICD-10-CM | POA: Diagnosis not present

## 2017-08-01 DIAGNOSIS — D539 Nutritional anemia, unspecified: Secondary | ICD-10-CM

## 2017-08-01 DIAGNOSIS — C9190 Lymphoid leukemia, unspecified not having achieved remission: Secondary | ICD-10-CM | POA: Diagnosis not present

## 2017-08-01 DIAGNOSIS — M25551 Pain in right hip: Secondary | ICD-10-CM

## 2017-08-01 LAB — MANUAL DIFFERENTIAL (CHCC SATELLITE)
ALC: 1.5 10*3/uL (ref 0.6–2.2)
ANC (CHCC HP manual diff): 2.4 10*3/uL (ref 1.5–6.7)
EOS: 2 % (ref 0–7)
LYMPH: 30 % (ref 14–48)
MONO: 18 % — AB (ref 0–13)
Metamyelocytes: 2 % — ABNORMAL HIGH (ref 0–0)
PLT EST ~~LOC~~: ADEQUATE
SEG: 48 % (ref 40–75)

## 2017-08-01 LAB — CMP (CANCER CENTER ONLY)
ALBUMIN: 3.7 g/dL (ref 3.3–5.5)
ALK PHOS: 83 U/L (ref 26–84)
ALT: 20 U/L (ref 10–47)
AST: 16 U/L (ref 11–38)
BILIRUBIN TOTAL: 0.7 mg/dL (ref 0.20–1.60)
BUN, Bld: 15 mg/dL (ref 7–22)
CALCIUM: 10.8 mg/dL — AB (ref 8.0–10.3)
CO2: 29 meq/L (ref 18–33)
CREATININE: 1.4 mg/dL — AB (ref 0.6–1.2)
Chloride: 99 mEq/L (ref 98–108)
GLUCOSE: 208 mg/dL — AB (ref 73–118)
Potassium: 4.1 mEq/L (ref 3.3–4.7)
SODIUM: 146 meq/L — AB (ref 128–145)
Total Protein: 5.9 g/dL — ABNORMAL LOW (ref 6.4–8.1)

## 2017-08-01 LAB — CBC WITH DIFFERENTIAL (CANCER CENTER ONLY)
HCT: 30.3 % — ABNORMAL LOW (ref 34.8–46.6)
HGB: 10.2 g/dL — ABNORMAL LOW (ref 11.6–15.9)
MCH: 25.8 pg — AB (ref 26.0–34.0)
MCHC: 33.7 g/dL (ref 32.0–36.0)
MCV: 77 fL — ABNORMAL LOW (ref 81–101)
PLATELETS: 206 10*3/uL (ref 145–400)
RBC: 3.96 10*6/uL (ref 3.70–5.32)
RDW: 15 % (ref 11.1–15.7)
WBC: 4.9 10*3/uL (ref 3.9–10.0)

## 2017-08-01 LAB — LACTATE DEHYDROGENASE: LDH: 159 U/L (ref 125–245)

## 2017-08-01 MED ORDER — HEPARIN SOD (PORK) LOCK FLUSH 100 UNIT/ML IV SOLN
500.0000 [IU] | Freq: Once | INTRAVENOUS | Status: AC
  Administered 2017-08-01: 500 [IU] via INTRAVENOUS
  Filled 2017-08-01: qty 5

## 2017-08-01 MED ORDER — SODIUM CHLORIDE 0.9% FLUSH
10.0000 mL | INTRAVENOUS | Status: DC | PRN
  Administered 2017-08-01: 10 mL via INTRAVENOUS
  Filled 2017-08-01: qty 10

## 2017-08-01 MED ORDER — FOLIC ACID 1 MG PO TABS: 2.0000 mg | ORAL_TABLET | Freq: Every day | ORAL | 12 refills | Status: DC
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
## 2017-08-02 LAB — ERYTHROPOIETIN: ERYTHROPOIETIN: 47.9 m[IU]/mL — AB (ref 2.6–18.5)

## 2017-09-05 ENCOUNTER — Ambulatory Visit (HOSPITAL_BASED_OUTPATIENT_CLINIC_OR_DEPARTMENT_OTHER): Payer: Medicare Other | Admitting: Hematology & Oncology

## 2017-09-05 ENCOUNTER — Other Ambulatory Visit (HOSPITAL_BASED_OUTPATIENT_CLINIC_OR_DEPARTMENT_OTHER): Payer: Medicare Other

## 2017-09-05 ENCOUNTER — Encounter: Payer: Self-pay | Admitting: Hematology & Oncology

## 2017-09-05 ENCOUNTER — Ambulatory Visit: Payer: Medicare Other

## 2017-09-05 ENCOUNTER — Other Ambulatory Visit: Payer: Self-pay

## 2017-09-05 VITALS — BP 138/74 | HR 57 | Temp 98.4°F | Resp 18 | Wt 219.0 lb

## 2017-09-05 DIAGNOSIS — D539 Nutritional anemia, unspecified: Secondary | ICD-10-CM

## 2017-09-05 DIAGNOSIS — D649 Anemia, unspecified: Secondary | ICD-10-CM

## 2017-09-05 DIAGNOSIS — C9110 Chronic lymphocytic leukemia of B-cell type not having achieved remission: Secondary | ICD-10-CM

## 2017-09-05 LAB — CMP (CANCER CENTER ONLY)
ALK PHOS: 88 U/L — AB (ref 26–84)
ALT(SGPT): 14 U/L (ref 10–47)
AST: 20 U/L (ref 11–38)
Albumin: 3.5 g/dL (ref 3.3–5.5)
BUN, Bld: 11 mg/dL (ref 7–22)
CALCIUM: 10.8 mg/dL — AB (ref 8.0–10.3)
CO2: 27 meq/L (ref 18–33)
Chloride: 104 mEq/L (ref 98–108)
Creat: 1.1 mg/dl (ref 0.6–1.2)
GLUCOSE: 170 mg/dL — AB (ref 73–118)
POTASSIUM: 3.8 meq/L (ref 3.3–4.7)
Sodium: 142 mEq/L (ref 128–145)
Total Bilirubin: 0.7 mg/dl (ref 0.20–1.60)
Total Protein: 5.9 g/dL — ABNORMAL LOW (ref 6.4–8.1)

## 2017-09-05 LAB — IRON AND TIBC
%SAT: 25 % (ref 21–57)
IRON: 70 ug/dL (ref 41–142)
TIBC: 280 ug/dL (ref 236–444)
UIBC: 210 ug/dL (ref 120–384)

## 2017-09-05 LAB — MANUAL DIFFERENTIAL (CHCC SATELLITE)
ALC: 1.3 10*3/uL (ref 0.6–2.2)
ANC (CHCC MAN DIFF): 2.9 10*3/uL (ref 1.5–6.7)
Band Neutrophils: 5 % (ref 0–10)
EOS: 3 % (ref 0–7)
LYMPH: 26 % (ref 14–48)
MONO: 12 % (ref 0–13)
PLT EST ~~LOC~~: ADEQUATE
SEG: 54 % (ref 40–75)

## 2017-09-05 LAB — CBC WITH DIFFERENTIAL (CANCER CENTER ONLY)
HEMATOCRIT: 30.1 % — AB (ref 34.8–46.6)
HGB: 10.2 g/dL — ABNORMAL LOW (ref 11.6–15.9)
MCH: 25.8 pg — ABNORMAL LOW (ref 26.0–34.0)
MCHC: 33.9 g/dL (ref 32.0–36.0)
MCV: 76 fL — AB (ref 81–101)
Platelets: 201 10*3/uL (ref 145–400)
RBC: 3.96 10*6/uL (ref 3.70–5.32)
RDW: 14.9 % (ref 11.1–15.7)
WBC: 4.9 10*3/uL (ref 3.9–10.0)

## 2017-09-05 LAB — FERRITIN: Ferritin: 266 ng/ml (ref 9–269)

## 2017-09-05 LAB — RETICULOCYTES: Reticulocyte Count: 1.2 % (ref 0.6–2.6)

## 2017-09-05 LAB — TECHNOLOGIST REVIEW CHCC SATELLITE

## 2017-09-05 MED ORDER — HEPARIN SOD (PORK) LOCK FLUSH 100 UNIT/ML IV SOLN
500.0000 [IU] | Freq: Once | INTRAVENOUS | Status: AC
  Administered 2017-09-05: 500 [IU] via INTRAVENOUS
  Filled 2017-09-05: qty 5

## 2017-09-05 MED ORDER — SODIUM CHLORIDE 0.9% FLUSH
10.0000 mL | INTRAVENOUS | Status: DC | PRN
  Administered 2017-09-05: 10 mL via INTRAVENOUS
  Filled 2017-09-05: qty 10

## 2017-09-05 NOTE — Patient Instructions (Signed)
Implanted Port Home Guide An implanted port is a type of central line that is placed under the skin. Central lines are used to provide IV access when treatment or nutrition needs to be given through a person's veins. Implanted ports are used for long-term IV access. An implanted port may be placed because:  You need IV medicine that would be irritating to the small veins in your hands or arms.  You need long-term IV medicines, such as antibiotics.  You need IV nutrition for a long period.  You need frequent blood draws for lab tests.  You need dialysis.  Implanted ports are usually placed in the chest area, but they can also be placed in the upper arm, the abdomen, or the leg. An implanted port has two main parts:  Reservoir. The reservoir is round and will appear as a small, raised area under your skin. The reservoir is the part where a needle is inserted to give medicines or draw blood.  Catheter. The catheter is a thin, flexible tube that extends from the reservoir. The catheter is placed into a large vein. Medicine that is inserted into the reservoir goes into the catheter and then into the vein.  How will I care for my incision site? Do not get the incision site wet. Bathe or shower as directed by your health care provider. How is my port accessed? Special steps must be taken to access the port:  Before the port is accessed, a numbing cream can be placed on the skin. This helps numb the skin over the port site.  Your health care provider uses a sterile technique to access the port. ? Your health care provider must put on a mask and sterile gloves. ? The skin over your port is cleaned carefully with an antiseptic and allowed to dry. ? The port is gently pinched between sterile gloves, and a needle is inserted into the port.  Only "non-coring" port needles should be used to access the port. Once the port is accessed, a blood return should be checked. This helps ensure that the port  is in the vein and is not clogged.  If your port needs to remain accessed for a constant infusion, a clear (transparent) bandage will be placed over the needle site. The bandage and needle will need to be changed every week, or as directed by your health care provider.  Keep the bandage covering the needle clean and dry. Do not get it wet. Follow your health care provider's instructions on how to take a shower or bath while the port is accessed.  If your port does not need to stay accessed, no bandage is needed over the port.  What is flushing? Flushing helps keep the port from getting clogged. Follow your health care provider's instructions on how and when to flush the port. Ports are usually flushed with saline solution or a medicine called heparin. The need for flushing will depend on how the port is used.  If the port is used for intermittent medicines or blood draws, the port will need to be flushed: ? After medicines have been given. ? After blood has been drawn. ? As part of routine maintenance.  If a constant infusion is running, the port may not need to be flushed.  How long will my port stay implanted? The port can stay in for as long as your health care provider thinks it is needed. When it is time for the port to come out, surgery will be   done to remove it. The procedure is similar to the one performed when the port was put in. When should I seek immediate medical care? When you have an implanted port, you should seek immediate medical care if:  You notice a bad smell coming from the incision site.  You have swelling, redness, or drainage at the incision site.  You have more swelling or pain at the port site or the surrounding area.  You have a fever that is not controlled with medicine.  This information is not intended to replace advice given to you by your health care provider. Make sure you discuss any questions you have with your health care provider. Document  Released: 09/17/2005 Document Revised: 02/23/2016 Document Reviewed: 05/25/2013 Elsevier Interactive Patient Education  2017 [employer]vier Inc[employer]  

## 2017-09-05 NOTE — Progress Notes (Signed)
Hematology and Oncology Follow Up Visit  Megan Tate [id number] 26-Nov-1945 71 y.o. 09/05/2017   Principle Diagnosis:  Chronic lymphocytic leukemia- Trisomy 12  Current Therapy:   Status post 6 cycles of Gazyva/Bendamustine - completed4/2016 Maintenance Gazyva every 3 months s/p cycle #11 -  completed 2 years in June 2018    Interim History:  Megan Tate is back for follow-up.  She and her husband had a wonderful time done in [place] for Thanksgiving.  There were about 60 people down there.  They really enjoyed themselves.  She has not been checking her blood sugars at home.  She does not have a glucometer.  She really is not to happy with her family doctor.  As such, I gave her a prescription myself for the glucometer.  She has had no problems with fever.  She has had no cough or shortness of breath.  She has had no change in bowel or bladder habits.  She has not noted any adenopathy.  There is been no leg swelling.  She has had no rashes.  Overall, her performance status is ECOG 1.   Medications:  Allergies as of 09/05/2017      Reactions   Tylenol [acetaminophen] Other (See Comments)   Messes with stomach      Medication List        Accurate as of 09/05/17  8:39 AM. Always use your most recent med list.          cloNIDine 0.2 MG tablet Commonly known as:  CATAPRES Take 0.2 mg by mouth 2 (two) times daily.   dorzolamide-timolol 22.3-6.8 MG/ML ophthalmic solution Commonly known as:  COSOPT Place 1 drop into both eyes 2 (two) times daily.   doxepin 10 MG capsule Commonly known as:  SINEQUAN Take 1 capsule by mouth daily.   folic acid 1 MG tablet Commonly known as:  FOLVITE Take 2 tablets (2 mg total) by mouth daily.   FUSION PLUS Caps Take 1 capsule by mouth every morning.   glucose blood test strip Monitor blood glucose daily before every meal and at bedtime   JANUMET XR 50-1000 MG Tb24 Generic drug:  SitaGLIPtin-MetFORMIN HCl Take 2 tablets by mouth  daily.   LEVEMIR FLEXTOUCH 100 UNIT/ML Pen Generic drug:  Insulin Detemir Inject 10 Units into the skin every morning.   lidocaine-prilocaine cream Commonly known as:  EMLA Apply to Port-A-Cath site 1 hour ride to treatment   LINZESS 145 MCG Caps capsule Generic drug:  linaclotide Take 1 capsule by mouth daily.   losartan 100 MG tablet Commonly known as:  COZAAR Take 100 mg by mouth daily.   metoprolol 200 MG 24 hr tablet Commonly known as:  TOPROL-XL   ONE TOUCH ULTRA SYSTEM KIT w/Device Kit 1 kit by Does not apply route once. Check blood sugar daily before meals and at bedtime.   rosuvastatin 20 MG tablet Commonly known as:  CRESTOR Take 20 mg by mouth daily.   spironolactone 50 MG tablet Commonly known as:  ALDACTONE   tiZANidine 2 MG tablet Commonly known as:  ZANAFLEX Take 1 tablet (2 mg total) by mouth every 8 (eight) hours as needed for muscle spasms.       Allergies:  Allergies  Allergen Reactions  . Tylenol [Acetaminophen] Other (See Comments)    Messes with stomach    Past Medical History, Surgical history, Social history, and Family History were reviewed and updated.  Review of Systems: As stated in the interim history  Physical  Exam:  weight is 219 lb (99.3 kg). Her oral temperature is 98.4 F (36.9 C). Her blood pressure is 138/74 and her pulse is 57 (abnormal). Her respiration is 18 and oxygen saturation is 100%.   Wt Readings from Last 3 Encounters:  09/05/17 219 lb (99.3 kg)  08/01/17 217 lb 8 oz (98.7 kg)  07/01/17 210 lb (95.3 kg)    Physical Exam  Constitutional: She is oriented to person, place, and time.  HENT:  Head: Normocephalic and atraumatic.  Mouth/Throat: Oropharynx is clear and moist.  Eyes: EOM are normal. Pupils are equal, round, and reactive to light.  Neck: Normal range of motion.  Cardiovascular: Normal rate, regular rhythm and normal heart sounds.  Pulmonary/Chest: Effort normal and breath sounds normal.    Abdominal: Soft. Bowel sounds are normal.  Musculoskeletal: Normal range of motion. She exhibits no edema, tenderness or deformity.  Lymphadenopathy:    She has no cervical adenopathy.  Neurological: She is alert and oriented to person, place, and time.  Skin: Skin is warm and dry. No rash noted. No erythema.  Psychiatric: She has a normal mood and affect. Her behavior is normal. Judgment and thought content normal.  Vitals reviewed.   Lab Results  Component Value Date   WBC 4.9 08/01/2017   HGB 10.2 (L) 08/01/2017   HCT 30.3 (L) 08/01/2017   MCV 77 (L) 08/01/2017   PLT 206 08/01/2017   Lab Results  Component Value Date   FERRITIN 214 11/28/2016   IRON 103 11/28/2016   TIBC 318 11/28/2016   UIBC 214 11/28/2016   IRONPCTSAT 33 11/28/2016   Lab Results  Component Value Date   RETICCTPCT 1.2 04/07/2014   RBC 3.96 08/01/2017   RETICCTABS 65.4 04/07/2014   Lab Results  Component Value Date   KAPLAMBRATIO 1.59 03/28/2017   Lab Results  Component Value Date   IGGSERUM 539 (L) 03/28/2017   IGA 41 (L) 03/30/2015   IGMSERUM <5 (L) 03/28/2017   Lab Results  Component Value Date   TOTALPROTELP 5.5 (L) 03/30/2015   ALBUMINELP 3.4 (L) 03/30/2015   A1GS 0.3 03/30/2015   A2GS 0.8 03/30/2015   BETS 0.4 03/30/2015   BETA2SER 0.2 03/30/2015   GAMS 0.5 (L) 03/30/2015   MSPIKE Not Observed 03/28/2017   SPEI * 03/30/2015     Chemistry      Component Value Date/Time   NA 146 (H) 08/01/2017 0830   K 4.1 08/01/2017 0830   CL 99 08/01/2017 0830   CO2 29 08/01/2017 0830   BUN 15 08/01/2017 0830   CREATININE 1.4 (H) 08/01/2017 0830      Component Value Date/Time   CALCIUM 10.8 (H) 08/01/2017 0830   ALKPHOS 83 08/01/2017 0830   AST 16 08/01/2017 0830   ALT 20 08/01/2017 0830   BILITOT 0.70 08/01/2017 0830     Impression and Plan: Megan Tate is 71 year old African-American female with chronic lymphocytic leukemia. She completed treatment with Gazyva and Bendamustine in  April 2016. She completed her maintenance Gazyva in June.  Her hemoglobin is holding steady.  She now is on folic acid.  I told her to continue the folic acid.  I just do not think we have to go with Aranesp as of yet.  She really is not symptomatic.  I am unsure how much Aranesp would really help her clinically.  I think we can get her back in 2 months now.  We can get her through the holidays and hopefully, most of the  winter.    She has a Port-A-Cath.  We will flush this when she comes back.  Volanda Napoleon, MD 12/6/20188:39 AM

## 2017-09-27 ENCOUNTER — Other Ambulatory Visit: Payer: Self-pay | Admitting: Hematology & Oncology

## 2017-09-27 NOTE — Telephone Encounter (Signed)
100 strip per pharmacy request

## 2017-09-30 ENCOUNTER — Other Ambulatory Visit: Payer: Self-pay | Admitting: *Deleted

## 2017-09-30 MED ORDER — GLUCOSE BLOOD VI STRP: ORAL_STRIP | 0 refills | Status: AC

## 2017-09-30 MED ORDER — GLUCOSE BLOOD VI STRP: ORAL_STRIP | 0 refills | Status: DC

## 2017-10-02 ENCOUNTER — Other Ambulatory Visit: Payer: Self-pay | Admitting: *Deleted

## 2017-10-02 MED ORDER — UNABLE TO FIND: 1 refills | Status: DC

## 2017-10-04 DIAGNOSIS — E559 Vitamin D deficiency, unspecified: Secondary | ICD-10-CM | POA: Diagnosis not present

## 2017-10-04 DIAGNOSIS — M79604 Pain in right leg: Secondary | ICD-10-CM | POA: Diagnosis not present

## 2017-10-04 DIAGNOSIS — I10 Essential (primary) hypertension: Secondary | ICD-10-CM | POA: Diagnosis not present

## 2017-10-04 DIAGNOSIS — M79605 Pain in left leg: Secondary | ICD-10-CM | POA: Diagnosis not present

## 2017-10-04 DIAGNOSIS — E1165 Type 2 diabetes mellitus with hyperglycemia: Secondary | ICD-10-CM | POA: Diagnosis not present

## 2017-10-04 DIAGNOSIS — R0989 Other specified symptoms and signs involving the circulatory and respiratory systems: Secondary | ICD-10-CM | POA: Diagnosis not present

## 2017-10-04 DIAGNOSIS — Z0000 Encounter for general adult medical examination without abnormal findings: Secondary | ICD-10-CM | POA: Diagnosis not present

## 2017-10-04 DIAGNOSIS — R5383 Other fatigue: Secondary | ICD-10-CM | POA: Diagnosis not present

## 2017-10-16 DIAGNOSIS — R300 Dysuria: Secondary | ICD-10-CM | POA: Diagnosis not present

## 2017-10-16 DIAGNOSIS — R5383 Other fatigue: Secondary | ICD-10-CM | POA: Diagnosis not present

## 2017-10-16 DIAGNOSIS — E1165 Type 2 diabetes mellitus with hyperglycemia: Secondary | ICD-10-CM | POA: Diagnosis not present

## 2017-10-16 DIAGNOSIS — E559 Vitamin D deficiency, unspecified: Secondary | ICD-10-CM | POA: Diagnosis not present

## 2017-10-16 DIAGNOSIS — A499 Bacterial infection, unspecified: Secondary | ICD-10-CM | POA: Diagnosis not present

## 2017-10-16 DIAGNOSIS — N390 Urinary tract infection, site not specified: Secondary | ICD-10-CM | POA: Diagnosis not present

## 2017-10-28 DIAGNOSIS — I10 Essential (primary) hypertension: Secondary | ICD-10-CM | POA: Diagnosis not present

## 2017-10-28 DIAGNOSIS — R946 Abnormal results of thyroid function studies: Secondary | ICD-10-CM | POA: Diagnosis not present

## 2017-10-28 DIAGNOSIS — E782 Mixed hyperlipidemia: Secondary | ICD-10-CM | POA: Diagnosis not present

## 2017-10-28 DIAGNOSIS — E1169 Type 2 diabetes mellitus with other specified complication: Secondary | ICD-10-CM | POA: Diagnosis not present

## 2017-10-31 DIAGNOSIS — R946 Abnormal results of thyroid function studies: Secondary | ICD-10-CM | POA: Diagnosis not present

## 2017-10-31 DIAGNOSIS — N184 Chronic kidney disease, stage 4 (severe): Secondary | ICD-10-CM | POA: Diagnosis not present

## 2017-11-07 ENCOUNTER — Inpatient Hospital Stay: Payer: Medicare Other | Attending: Hematology & Oncology | Admitting: Hematology & Oncology

## 2017-11-07 ENCOUNTER — Inpatient Hospital Stay: Payer: Medicare Other

## 2017-11-07 ENCOUNTER — Other Ambulatory Visit: Payer: Self-pay

## 2017-11-07 ENCOUNTER — Encounter: Payer: Self-pay | Admitting: Hematology & Oncology

## 2017-11-07 VITALS — BP 161/70 | HR 50 | Temp 98.6°F | Wt 219.1 lb

## 2017-11-07 DIAGNOSIS — C9110 Chronic lymphocytic leukemia of B-cell type not having achieved remission: Secondary | ICD-10-CM

## 2017-11-07 DIAGNOSIS — Z794 Long term (current) use of insulin: Secondary | ICD-10-CM | POA: Insufficient documentation

## 2017-11-07 DIAGNOSIS — Z79899 Other long term (current) drug therapy: Secondary | ICD-10-CM | POA: Diagnosis not present

## 2017-11-07 DIAGNOSIS — E119 Type 2 diabetes mellitus without complications: Secondary | ICD-10-CM | POA: Diagnosis not present

## 2017-11-07 DIAGNOSIS — D649 Anemia, unspecified: Secondary | ICD-10-CM | POA: Diagnosis not present

## 2017-11-07 LAB — CMP (CANCER CENTER ONLY)
ALBUMIN: 3.5 g/dL (ref 3.5–5.0)
ALK PHOS: 71 U/L (ref 40–150)
ALT: 8 U/L (ref 0–55)
ANION GAP: 7 (ref 3–11)
AST: 13 U/L (ref 5–34)
BUN: 13 mg/dL (ref 7–26)
CO2: 28 mmol/L (ref 22–29)
Calcium: 10.9 mg/dL — ABNORMAL HIGH (ref 8.4–10.4)
Chloride: 103 mmol/L (ref 98–109)
Creatinine: 1.33 mg/dL — ABNORMAL HIGH (ref 0.60–1.10)
GFR, EST AFRICAN AMERICAN: 46 mL/min — AB (ref >60)
GFR, Estimated: 39 mL/min — ABNORMAL LOW (ref >60)
GLUCOSE: 177 mg/dL — AB (ref 70–140)
Potassium: 4 mmol/L (ref 3.5–5.1)
Sodium: 138 mmol/L (ref 136–145)
Total Bilirubin: 0.4 mg/dL (ref 0.2–1.2)
Total Protein: 5.7 g/dL — ABNORMAL LOW (ref 6.4–8.3)

## 2017-11-07 LAB — CBC WITH DIFFERENTIAL (CANCER CENTER ONLY)
BAND NEUTROPHILS: 4 %
BASOS PCT: 0 %
Basophils Absolute: 0 10*3/uL (ref 0.0–0.1)
Blasts: 0 %
EOS ABS: 0.1 10*3/uL (ref 0.0–0.5)
EOS PCT: 3 %
HCT: 29 % — ABNORMAL LOW (ref 34.8–46.6)
HEMOGLOBIN: 9.7 g/dL — AB (ref 11.6–15.9)
LYMPHS PCT: 28 %
Lymphs Abs: 1.1 10*3/uL (ref 0.9–3.3)
MCH: 25.5 pg — AB (ref 26.0–34.0)
MCHC: 33.4 g/dL (ref 32.0–36.0)
MCV: 76.3 fL — ABNORMAL LOW (ref 81.0–101.0)
MONO ABS: 0.4 10*3/uL (ref 0.1–0.9)
Metamyelocytes Relative: 3 %
Monocytes Relative: 10 %
Myelocytes: 0 %
Neutro Abs: 2.2 10*3/uL (ref 1.5–6.5)
Neutrophils Relative %: 52 %
OTHER: 0 %
PLATELETS: 174 10*3/uL (ref 145–400)
PROMYELOCYTES ABS: 0 %
RBC: 3.8 MIL/uL (ref 3.70–5.32)
RDW: 15.3 % (ref 11.1–15.7)
WBC Count: 3.8 10*3/uL — ABNORMAL LOW (ref 3.9–10.0)
nRBC: 0 /100 WBC

## 2017-11-07 LAB — IRON AND TIBC
IRON: 67 ug/dL (ref 41–142)
SATURATION RATIOS: 25 % (ref 21–57)
TIBC: 266 ug/dL (ref 236–444)
UIBC: 199 ug/dL

## 2017-11-07 LAB — RETICULOCYTES
RBC.: 3.82 MIL/uL (ref 3.70–5.45)
RETIC COUNT ABSOLUTE: 42 10*3/uL (ref 33.7–90.7)
RETIC CT PCT: 1.1 % (ref 0.7–2.1)

## 2017-11-07 LAB — FERRITIN: Ferritin: 286 ng/mL — ABNORMAL HIGH (ref 9–269)

## 2017-11-07 MED ORDER — HEPARIN SOD (PORK) LOCK FLUSH 100 UNIT/ML IV SOLN
500.0000 [IU] | Freq: Once | INTRAVENOUS | Status: AC
  Administered 2017-11-07: 500 [IU] via INTRAVENOUS
  Filled 2017-11-07: qty 5

## 2017-11-07 MED ORDER — SODIUM CHLORIDE 0.9% FLUSH
10.0000 mL | INTRAVENOUS | Status: DC | PRN
  Administered 2017-11-07: 10 mL via INTRAVENOUS
  Filled 2017-11-07: qty 10

## 2017-11-07 NOTE — Patient Instructions (Signed)
Implanted Port Insertion, Care After °This sheet gives you information about how to care for yourself after your procedure. Your health care provider may also give you more specific instructions. If you have problems or questions, contact your health care provider. °What can I expect after the procedure? °After your procedure, it is common to have: °· Discomfort at the port insertion site. °· Bruising on the skin over the port. This should improve over 3-4 days. ° °Follow these instructions at home: °Port care °· After your port is placed, you will get a manufacturer's information card. The card has information about your port. Keep this card with you at all times. °· Take care of the port as told by your health care provider. Ask your health care provider if you or a family member can get training for taking care of the port at home. A home health care nurse may also take care of the port. °· Make sure to remember what type of port you have. °Incision care °· Follow instructions from your health care provider about how to take care of your port insertion site. Make sure you: °? Wash your hands with soap and water before you change your bandage (dressing). If soap and water are not available, use hand sanitizer. °? Change your dressing as told by your health care provider. °? Leave stitches (sutures), skin glue, or adhesive strips in place. These skin closures may need to stay in place for 2 weeks or longer. If adhesive strip edges start to loosen and curl up, you may trim the loose edges. Do not remove adhesive strips completely unless your health care provider tells you to do that. °· Check your port insertion site every day for signs of infection. Check for: °? More redness, swelling, or pain. °? More fluid or blood. °? Warmth. °? Pus or a bad smell. °General instructions °· Do not take baths, swim, or use a hot tub until your health care provider approves. °· Do not lift anything that is heavier than 10 lb (4.5  kg) for a week, or as told by your health care provider. °· Ask your health care provider when it is okay to: °? Return to work or school. °? Resume usual physical activities or sports. °· Do not drive for 24 hours if you were given a medicine to help you relax (sedative). °· Take over-the-counter and prescription medicines only as told by your health care provider. °· Wear a medical alert bracelet in case of an emergency. This will tell any health care providers that you have a port. °· Keep all follow-up visits as told by your health care provider. This is important. °Contact a health care provider if: °· You cannot flush your port with saline as directed, or you cannot draw blood from the port. °· You have a fever or chills. °· You have more redness, swelling, or pain around your port insertion site. °· You have more fluid or blood coming from your port insertion site. °· Your port insertion site feels warm to the touch. °· You have pus or a bad smell coming from the port insertion site. °Get help right away if: °· You have chest pain or shortness of breath. °· You have bleeding from your port that you cannot control. °Summary °· Take care of the port as told by your health care provider. °· Change your dressing as told by your health care provider. °· Keep all follow-up visits as told by your health care provider. °  This information is not intended to replace advice given to you by your health care provider. Make sure you discuss any questions you have with your health care provider. °Document Released: 07/08/2013 Document Revised: 08/08/2016 Document Reviewed: 08/08/2016 °[employer]vier Interactive Patient Education © 2017 [employer] ° °

## 2017-11-07 NOTE — Progress Notes (Signed)
Hematology and Oncology Follow Up Visit  Megan Tate [id number] 09-Apr-1946 72 y.o. 11/07/2017   Principle Diagnosis:  Chronic lymphocytic leukemia- Trisomy 12  Current Therapy:   Status post 6 cycles of Gazyva/Bendamustine - completed4/2016 Maintenance Gazyva every 3 months s/p cycle #11 -  completed 2 years in June 2018    Interim History:  Megan Tate is back for follow-up.  She had all of her teeth removed about 3 weeks ago.  She comes in with a facemask on.  There were no problems with the extractions.  She had no bleeding.  There is no issues with infection.  We make sure that she was on amoxicillin as a antibiotic prophylaxis.  She had a nice Christmas.  She had no problems over the holidays.  She had no problems with fever.  She has not noted any palpable lymph nodes.  She has had no problems with rashes.  Formula saw her, her iron studies showed a ferritin of 266 with an iron saturation of 25%.    Her erythropoietin level back in November was 48.  She has been anemic.  I suspect this probably is on the basis of her diabetes.  She is relatively asymptomatic with it.  As such, I do not think we need to start her on any ESA.    Overall, her performance status is ECOG 1.    Medications:  Allergies as of 11/07/2017      Reactions   Tylenol [acetaminophen] Other (See Comments)   Messes with stomach      Medication List        Accurate as of 11/07/17 10:00 AM. Always use your most recent med list.          cloNIDine 0.2 MG tablet Commonly known as:  CATAPRES Take 0.2 mg by mouth 2 (two) times daily.   dorzolamide-timolol 22.3-6.8 MG/ML ophthalmic solution Commonly known as:  COSOPT Place 1 drop into both eyes 2 (two) times daily.   doxepin 10 MG capsule Commonly known as:  SINEQUAN Take 1 capsule by mouth daily.   folic acid 1 MG tablet Commonly known as:  FOLVITE Take 2 tablets (2 mg total) by mouth daily.   FUSION PLUS Caps Take 1 capsule by mouth every  morning.   glucose blood test strip Commonly known as:  ONE TOUCH ULTRA TEST USE TO TEST BLOOD SUGAR BEFORE MEALS AND AT BEDTIME   JANUMET XR 50-1000 MG Tb24 Generic drug:  SitaGLIPtin-MetFORMIN HCl Take 2 tablets by mouth daily.   LEVEMIR FLEXTOUCH 100 UNIT/ML Pen Generic drug:  Insulin Detemir Inject 10 Units into the skin every morning.   lidocaine-prilocaine cream Commonly known as:  EMLA Apply to Port-A-Cath site 1 hour ride to treatment   LINZESS 145 MCG Caps capsule Generic drug:  linaclotide Take 1 capsule by mouth daily.   losartan 100 MG tablet Commonly known as:  COZAAR Take 100 mg by mouth daily.   metoprolol 200 MG 24 hr tablet Commonly known as:  TOPROL-XL   ONE TOUCH ULTRA SYSTEM KIT w/Device Kit 1 kit by Does not apply route once. Check blood sugar daily before meals and at bedtime.   rosuvastatin 20 MG tablet Commonly known as:  CRESTOR Take 20 mg by mouth daily.   spironolactone 50 MG tablet Commonly known as:  ALDACTONE   tiZANidine 2 MG tablet Commonly known as:  ZANAFLEX Take 1 tablet (2 mg total) by mouth every 8 (eight) hours as needed for muscle spasms.   UNABLE  TO FIND One Touch Ultra Blood Glucose Monitoring Meter. Dx E11.9       Allergies:  Allergies  Allergen Reactions  . Tylenol [Acetaminophen] Other (See Comments)    Messes with stomach    Past Medical History, Surgical history, Social history, and Family History were reviewed and updated.  Review of Systems: Review of Systems  Constitutional: Negative.   HENT: Positive for sore throat.   Eyes: Negative.   Respiratory: Negative.   Cardiovascular: Negative.   Gastrointestinal: Negative.   Genitourinary: Negative.   Musculoskeletal: Negative.   Skin: Negative.   Neurological: Negative.   Endo/Heme/Allergies: Negative.   Psychiatric/Behavioral: Negative.     Physical Exam:  weight is 219 lb 1.6 oz (99.4 kg). Her oral temperature is 98.6 F (37 C). Her blood  pressure is 161/70 (abnormal) and her pulse is 50 (abnormal). Her oxygen saturation is 100%.   Wt Readings from Last 3 Encounters:  11/07/17 219 lb 1.6 oz (99.4 kg)  09/05/17 219 lb (99.3 kg)  08/01/17 217 lb 8 oz (98.7 kg)    Physical Exam  Constitutional: She is oriented to person, place, and time.  HENT:  Head: Normocephalic and atraumatic.  Mouth/Throat: Oropharynx is clear and moist.  Eyes: EOM are normal. Pupils are equal, round, and reactive to light.  Neck: Normal range of motion.  Cardiovascular: Normal rate, regular rhythm and normal heart sounds.  Pulmonary/Chest: Effort normal and breath sounds normal.  Abdominal: Soft. Bowel sounds are normal.  Musculoskeletal: Normal range of motion. She exhibits no edema, tenderness or deformity.  Lymphadenopathy:    She has no cervical adenopathy.  Neurological: She is alert and oriented to person, place, and time.  Skin: Skin is warm and dry. No rash noted. No erythema.  Psychiatric: She has a normal mood and affect. Her behavior is normal. Judgment and thought content normal.  Vitals reviewed.   Lab Results  Component Value Date   WBC 3.8 (L) 11/07/2017   HGB 10.2 (L) 09/05/2017   HCT 29.0 (L) 11/07/2017   MCV 76.3 (L) 11/07/2017   PLT 174 11/07/2017   Lab Results  Component Value Date   FERRITIN 266 09/05/2017   IRON 70 09/05/2017   TIBC 280 09/05/2017   UIBC 210 09/05/2017   IRONPCTSAT 25 09/05/2017   Lab Results  Component Value Date   RETICCTPCT 1.2 04/07/2014   RBC 3.80 11/07/2017   RETICCTABS 65.4 04/07/2014   Lab Results  Component Value Date   KAPLAMBRATIO 1.59 03/28/2017   Lab Results  Component Value Date   IGGSERUM 539 (L) 03/28/2017   IGA 41 (L) 03/30/2015   IGMSERUM <5 (L) 03/28/2017   Lab Results  Component Value Date   TOTALPROTELP 5.5 (L) 03/30/2015   ALBUMINELP 3.4 (L) 03/30/2015   A1GS 0.3 03/30/2015   A2GS 0.8 03/30/2015   BETS 0.4 03/30/2015   BETA2SER 0.2 03/30/2015   GAMS 0.5  (L) 03/30/2015   MSPIKE Not Observed 03/28/2017   SPEI * 03/30/2015     Chemistry      Component Value Date/Time   NA 142 09/05/2017 0751   K 3.8 09/05/2017 0751   CL 104 09/05/2017 0751   CO2 27 09/05/2017 0751   BUN 11 09/05/2017 0751   CREATININE 1.1 09/05/2017 0751      Component Value Date/Time   CALCIUM 10.8 (H) 09/05/2017 0751   ALKPHOS 88 (H) 09/05/2017 0751   AST 20 09/05/2017 0751   ALT 14 09/05/2017 0751   BILITOT 0.70 09/05/2017  0751     Impression and Plan: Ms. Rosenburg is 72 year old African-American female with chronic lymphocytic leukemia. She completed treatment with Gazyva and Bendamustine in April 2016. She completed her maintenance Gazyva in June.  For right now, we will just watch her.  Again, I do not see that we have to use Aranesp.  We will plan to get her back in about 2 months now.  I think this would be reasonable.  She has a Port-A-Cath.  We need to make sure this is flushed.   Volanda Napoleon, MD 2/7/201910:00 AM

## 2017-11-18 ENCOUNTER — Other Ambulatory Visit: Payer: Self-pay | Admitting: *Deleted

## 2017-11-18 DIAGNOSIS — H43393 Other vitreous opacities, bilateral: Secondary | ICD-10-CM | POA: Diagnosis not present

## 2017-11-18 DIAGNOSIS — Z83511 Family history of glaucoma: Secondary | ICD-10-CM | POA: Diagnosis not present

## 2017-11-18 DIAGNOSIS — H40053 Ocular hypertension, bilateral: Secondary | ICD-10-CM | POA: Diagnosis not present

## 2017-11-18 DIAGNOSIS — E113293 Type 2 diabetes mellitus with mild nonproliferative diabetic retinopathy without macular edema, bilateral: Secondary | ICD-10-CM | POA: Diagnosis not present

## 2017-11-18 DIAGNOSIS — H524 Presbyopia: Secondary | ICD-10-CM | POA: Diagnosis not present

## 2017-11-18 DIAGNOSIS — H04123 Dry eye syndrome of bilateral lacrimal glands: Secondary | ICD-10-CM | POA: Diagnosis not present

## 2017-11-18 DIAGNOSIS — Z794 Long term (current) use of insulin: Secondary | ICD-10-CM | POA: Diagnosis not present

## 2017-11-18 MED ORDER — TIZANIDINE HCL 2 MG PO TABS: 2.0000 mg | ORAL_TABLET | Freq: Three times a day (TID) | ORAL | 1 refills | Status: DC | PRN

## 2017-11-25 ENCOUNTER — Other Ambulatory Visit: Payer: Self-pay | Admitting: *Deleted

## 2017-11-25 DIAGNOSIS — C9110 Chronic lymphocytic leukemia of B-cell type not having achieved remission: Secondary | ICD-10-CM

## 2017-11-25 DIAGNOSIS — D500 Iron deficiency anemia secondary to blood loss (chronic): Secondary | ICD-10-CM

## 2017-11-25 MED ORDER — FUSION PLUS PO CAPS: 1.0000 | ORAL_CAPSULE | Freq: Every morning | ORAL | 2 refills | Status: DC

## 2017-12-19 ENCOUNTER — Other Ambulatory Visit: Payer: Self-pay

## 2017-12-19 ENCOUNTER — Inpatient Hospital Stay: Payer: Medicare Other | Attending: Hematology & Oncology

## 2017-12-19 VITALS — BP 173/66 | HR 50

## 2017-12-19 DIAGNOSIS — Z452 Encounter for adjustment and management of vascular access device: Secondary | ICD-10-CM | POA: Diagnosis not present

## 2017-12-19 DIAGNOSIS — C9110 Chronic lymphocytic leukemia of B-cell type not having achieved remission: Secondary | ICD-10-CM | POA: Insufficient documentation

## 2017-12-19 MED ORDER — SODIUM CHLORIDE 0.9% FLUSH
10.0000 mL | INTRAVENOUS | Status: DC | PRN
  Administered 2017-12-19: 10 mL via INTRAVENOUS
  Filled 2017-12-19: qty 10

## 2017-12-19 MED ORDER — HEPARIN SOD (PORK) LOCK FLUSH 100 UNIT/ML IV SOLN
500.0000 [IU] | Freq: Once | INTRAVENOUS | Status: AC
  Administered 2017-12-19: 500 [IU] via INTRAVENOUS
  Filled 2017-12-19: qty 5

## 2017-12-19 NOTE — Patient Instructions (Signed)
Implanted Port Home Guide An implanted port is a type of central line that is placed under the skin. Central lines are used to provide IV access when treatment or nutrition needs to be given through a person's veins. Implanted ports are used for long-term IV access. An implanted port may be placed because:  You need IV medicine that would be irritating to the small veins in your hands or arms.  You need long-term IV medicines, such as antibiotics.  You need IV nutrition for a long period.  You need frequent blood draws for lab tests.  You need dialysis.  Implanted ports are usually placed in the chest area, but they can also be placed in the upper arm, the abdomen, or the leg. An implanted port has two main parts:  Reservoir. The reservoir is round and will appear as a small, raised area under your skin. The reservoir is the part where a needle is inserted to give medicines or draw blood.  Catheter. The catheter is a thin, flexible tube that extends from the reservoir. The catheter is placed into a large vein. Medicine that is inserted into the reservoir goes into the catheter and then into the vein.  How will I care for my incision site? Do not get the incision site wet. Bathe or shower as directed by your health care provider. How is my port accessed? Special steps must be taken to access the port:  Before the port is accessed, a numbing cream can be placed on the skin. This helps numb the skin over the port site.  Your health care provider uses a sterile technique to access the port. ? Your health care provider must put on a mask and sterile gloves. ? The skin over your port is cleaned carefully with an antiseptic and allowed to dry. ? The port is gently pinched between sterile gloves, and a needle is inserted into the port.  Only "non-coring" port needles should be used to access the port. Once the port is accessed, a blood return should be checked. This helps ensure that the port  is in the vein and is not clogged.  If your port needs to remain accessed for a constant infusion, a clear (transparent) bandage will be placed over the needle site. The bandage and needle will need to be changed every week, or as directed by your health care provider.  Keep the bandage covering the needle clean and dry. Do not get it wet. Follow your health care provider's instructions on how to take a shower or bath while the port is accessed.  If your port does not need to stay accessed, no bandage is needed over the port.  What is flushing? Flushing helps keep the port from getting clogged. Follow your health care provider's instructions on how and when to flush the port. Ports are usually flushed with saline solution or a medicine called heparin. The need for flushing will depend on how the port is used.  If the port is used for intermittent medicines or blood draws, the port will need to be flushed: ? After medicines have been given. ? After blood has been drawn. ? As part of routine maintenance.  If a constant infusion is running, the port may not need to be flushed.  How long will my port stay implanted? The port can stay in for as long as your health care provider thinks it is needed. When it is time for the port to come out, surgery will be   done to remove it. The procedure is similar to the one performed when the port was put in. When should I seek immediate medical care? When you have an implanted port, you should seek immediate medical care if:  You notice a bad smell coming from the incision site.  You have swelling, redness, or drainage at the incision site.  You have more swelling or pain at the port site or the surrounding area.  You have a fever that is not controlled with medicine.  This information is not intended to replace advice given to you by your health care provider. Make sure you discuss any questions you have with your health care provider. Document  Released: 09/17/2005 Document Revised: 02/23/2016 Document Reviewed: 05/25/2013 Elsevier Interactive Patient Education  2017 [employer]vier Inc[employer]  

## 2018-01-02 ENCOUNTER — Inpatient Hospital Stay: Payer: Medicare Other

## 2018-01-02 ENCOUNTER — Inpatient Hospital Stay: Payer: Medicare Other | Attending: Hematology & Oncology | Admitting: Family

## 2018-01-02 ENCOUNTER — Other Ambulatory Visit: Payer: Self-pay

## 2018-01-02 ENCOUNTER — Encounter: Payer: Self-pay | Admitting: Family

## 2018-01-02 VITALS — BP 171/60 | HR 54 | Temp 98.1°F | Resp 18 | Wt 215.0 lb

## 2018-01-02 DIAGNOSIS — C9110 Chronic lymphocytic leukemia of B-cell type not having achieved remission: Secondary | ICD-10-CM | POA: Insufficient documentation

## 2018-01-02 DIAGNOSIS — Z79899 Other long term (current) drug therapy: Secondary | ICD-10-CM | POA: Insufficient documentation

## 2018-01-02 LAB — CBC WITH DIFFERENTIAL (CANCER CENTER ONLY)
BASOS ABS: 0.1 10*3/uL (ref 0.0–0.1)
BLASTS: 0 %
Band Neutrophils: 0 %
Basophils Relative: 2 %
Eosinophils Absolute: 0.1 10*3/uL (ref 0.0–0.5)
Eosinophils Relative: 3 %
HEMATOCRIT: 31.2 % — AB (ref 34.8–46.6)
Hemoglobin: 10.5 g/dL — ABNORMAL LOW (ref 11.6–15.9)
LYMPHS ABS: 1.4 10*3/uL (ref 0.9–3.3)
Lymphocytes Relative: 30 %
MCH: 25.4 pg — ABNORMAL LOW (ref 26.0–34.0)
MCHC: 33.7 g/dL (ref 32.0–36.0)
MCV: 75.5 fL — ABNORMAL LOW (ref 81.0–101.0)
METAMYELOCYTES PCT: 0 %
MYELOCYTES: 0 %
Monocytes Absolute: 0.9 10*3/uL (ref 0.1–0.9)
Monocytes Relative: 20 %
Neutro Abs: 2.1 10*3/uL (ref 1.5–6.5)
Neutrophils Relative %: 45 %
Other: 0 %
PLATELETS: 187 10*3/uL (ref 145–400)
PROMYELOCYTES ABS: 0 %
RBC: 4.13 MIL/uL (ref 3.70–5.32)
RDW: 14.9 % (ref 11.1–15.7)
WBC Count: 4.6 10*3/uL (ref 3.9–10.0)
nRBC: 0 /100 WBC

## 2018-01-02 LAB — CMP (CANCER CENTER ONLY)
ALT: 16 U/L (ref 10–47)
AST: 20 U/L (ref 11–38)
Albumin: 3.8 g/dL (ref 3.5–5.0)
Alkaline Phosphatase: 70 U/L (ref 26–84)
Anion gap: 9 (ref 5–15)
BUN: 13 mg/dL (ref 7–22)
CALCIUM: 11.3 mg/dL — AB (ref 8.0–10.3)
CO2: 30 mmol/L (ref 18–33)
Chloride: 103 mmol/L (ref 98–108)
Creatinine: 1.2 mg/dL (ref 0.60–1.20)
GLUCOSE: 228 mg/dL — AB (ref 73–118)
Potassium: 3.9 mmol/L (ref 3.3–4.7)
Sodium: 142 mmol/L (ref 128–145)
TOTAL PROTEIN: 6.2 g/dL — AB (ref 6.4–8.1)
Total Bilirubin: 0.8 mg/dL (ref 0.2–1.6)

## 2018-01-02 LAB — LACTATE DEHYDROGENASE: LDH: 158 U/L (ref 125–245)

## 2018-01-02 NOTE — Progress Notes (Signed)
Duplicate, error

## 2018-01-02 NOTE — Progress Notes (Signed)
Hematology and Oncology Follow Up Visit  Megan Tate [id number] 1945/12/31 72 y.o. 01/02/2018   Principle Diagnosis:  Chronic lymphocytic leukemia- Trisomy 12  Past Therapy:   Status post 6 cycles of Gazyva/Bendamustine - completed4/2016 Maintenance Gazyva every 3 months s/p cycle 11 -  completed 2 years in June 2018  Current Therapy: Observation   Interim History:  Megan Tate is here today with Megan Tate for follow-up. She is doing well and goes today to get Megan dentures. She denies fatigue. Hgb is up to 10.5, WBC count 4.6 and platelet count is 187.  No fever, chills, n/v, cough, rash, dizziness, SOB, chest pain, palpitations, abdominal pain or changes in bowel or bladder habits.  No episodes of bleeding, no bruising or petechiae.  No swelling, tenderness, numbness or tingling in Megan extremities. No c/o pain.  No lymphadenopathy noted on exam.  She has a good appetite and is staying well hydrated. Megan weight is stable.   ECOG Performance Status: 0 - Asymptomatic  Medications:  Allergies as of 01/02/2018      Reactions   Tylenol [acetaminophen] Other (See Comments)   Messes with stomach      Medication List        Accurate as of 01/02/18  9:33 AM. Always use your most recent med list.          cloNIDine 0.2 MG tablet Commonly known as:  CATAPRES Take 0.2 mg by mouth 2 (two) times daily.   dorzolamide-timolol 22.3-6.8 MG/ML ophthalmic solution Commonly known as:  COSOPT Place 1 drop into both eyes 2 (two) times daily.   doxepin 10 MG capsule Commonly known as:  SINEQUAN Take 1 capsule by mouth daily.   folic acid 1 MG tablet Commonly known as:  FOLVITE Take 2 tablets (2 mg total) by mouth daily.   FUSION PLUS Caps Take 1 capsule by mouth every morning.   glucose blood test strip Commonly known as:  ONE TOUCH ULTRA TEST USE TO TEST BLOOD SUGAR BEFORE MEALS AND AT BEDTIME   JANUMET XR 50-1000 MG Tb24 Generic drug:  SitaGLIPtin-MetFORMIN HCl Take 2 tablets  by mouth daily.   LEVEMIR FLEXTOUCH 100 UNIT/ML Pen Generic drug:  Insulin Detemir Inject 10 Units into the skin every morning.   lidocaine-prilocaine cream Commonly known as:  EMLA Apply to Port-A-Cath site 1 hour ride to treatment   LINZESS 145 MCG Caps capsule Generic drug:  linaclotide Take 1 capsule by mouth daily.   losartan 100 MG tablet Commonly known as:  COZAAR Take 100 mg by mouth daily.   metoprolol 200 MG 24 hr tablet Commonly known as:  TOPROL-XL   ONE TOUCH ULTRA SYSTEM KIT w/Device Kit 1 kit by Does not apply route once. Check blood sugar daily before meals and at bedtime.   rosuvastatin 20 MG tablet Commonly known as:  CRESTOR Take 20 mg by mouth daily.   spironolactone 50 MG tablet Commonly known as:  ALDACTONE   tiZANidine 2 MG tablet Commonly known as:  ZANAFLEX Take 1 tablet (2 mg total) by mouth every 8 (eight) hours as needed for muscle spasms.   UNABLE TO FIND One Touch Ultra Blood Glucose Monitoring Meter. Dx E11.9       Allergies:  Allergies  Allergen Reactions  . Tylenol [Acetaminophen] Other (See Comments)    Messes with stomach    Past Medical History, Surgical history, Social history, and Family History were reviewed and updated.  Review of Systems: All other 10 point review of systems  is negative.   Physical Exam:  weight is 215 lb (97.5 kg). Megan oral temperature is 98.1 F (36.7 C). Megan blood pressure is 171/60 (abnormal) and Megan pulse is 54 (abnormal). Megan respiration is 18 and oxygen saturation is 100%.   Wt Readings from Last 3 Encounters:  01/02/18 215 lb (97.5 kg)  11/07/17 219 lb 1.6 oz (99.4 kg)  09/05/17 219 lb (99.3 kg)    Ocular: Sclerae unicteric, pupils equal, round and reactive to light Ear-nose-throat: Oropharynx clear, dentition fair Lymphatic: No cervical, supraclavicular or axillary adenopathy Lungs no rales or rhonchi, good excursion bilaterally Heart regular rate and rhythm, no murmur  appreciated Abd soft, nontender, positive bowel sounds, no liver or spleen tip palpated on exam, no fluid wave MSK no focal spinal tenderness, no joint edema Neuro: non-focal, well-oriented, appropriate affect Breasts: Deferred   Lab Results  Component Value Date   WBC 4.6 01/02/2018   HGB 10.2 (L) 09/05/2017   HCT 31.2 (L) 01/02/2018   MCV 75.5 (L) 01/02/2018   PLT 187 01/02/2018   Lab Results  Component Value Date   FERRITIN 286 (H) 11/07/2017   IRON 67 11/07/2017   TIBC 266 11/07/2017   UIBC 199 11/07/2017   IRONPCTSAT 25 11/07/2017   Lab Results  Component Value Date   RETICCTPCT 1.1 11/07/2017   RBC 4.13 01/02/2018   RETICCTABS 65.4 04/07/2014   Lab Results  Component Value Date   KAPLAMBRATIO 1.59 03/28/2017   Lab Results  Component Value Date   IGGSERUM 539 (L) 03/28/2017   IGA 41 (L) 03/30/2015   IGMSERUM <5 (L) 03/28/2017   Lab Results  Component Value Date   TOTALPROTELP 5.5 (L) 03/30/2015   ALBUMINELP 3.4 (L) 03/30/2015   A1GS 0.3 03/30/2015   A2GS 0.8 03/30/2015   BETS 0.4 03/30/2015   BETA2SER 0.2 03/30/2015   GAMS 0.5 (L) 03/30/2015   MSPIKE Not Observed 03/28/2017   SPEI * 03/30/2015     Chemistry      Component Value Date/Time   NA 138 11/07/2017 0855   NA 142 09/05/2017 0751   K 4.0 11/07/2017 0855   K 3.8 09/05/2017 0751   CL 103 11/07/2017 0855   CL 104 09/05/2017 0751   CO2 28 11/07/2017 0855   CO2 27 09/05/2017 0751   BUN 13 11/07/2017 0855   BUN 11 09/05/2017 0751   CREATININE 1.33 (H) 11/07/2017 0855   CREATININE 1.1 09/05/2017 0751      Component Value Date/Time   CALCIUM 10.9 (H) 11/07/2017 0855   CALCIUM 10.8 (H) 09/05/2017 0751   ALKPHOS 71 11/07/2017 0855   ALKPHOS 88 (H) 09/05/2017 0751   AST 13 11/07/2017 0855   ALT 8 11/07/2017 0855   ALT 14 09/05/2017 0751   BILITOT 0.4 11/07/2017 0855      Impression and Plan: Megan Tate is a very pleasant 72 yo African American female with chronic lymphocytic leukemia. She  completed treatment with Gazyva/Bendamustine in April 2016 and maintenance Gazyva in June 2018.  She continues to do well and has no complaints at this time.  We will plan to see Megan back in another 4 months for follow-up.  She will contact our office with any questions or concerns. We can certainly see Megan sooner if need be.   Laverna Peace, NP 4/4/20199:33 AM

## 2018-01-15 DIAGNOSIS — E119 Type 2 diabetes mellitus without complications: Secondary | ICD-10-CM | POA: Diagnosis not present

## 2018-01-15 DIAGNOSIS — J069 Acute upper respiratory infection, unspecified: Secondary | ICD-10-CM | POA: Diagnosis not present

## 2018-01-29 ENCOUNTER — Emergency Department (HOSPITAL_BASED_OUTPATIENT_CLINIC_OR_DEPARTMENT_OTHER)
Admission: EM | Admit: 2018-01-29 | Discharge: 2018-01-29 | Disposition: A | Discharge status: Home / Self Care | Payer: Medicare Other | Attending: Emergency Medicine | Admitting: Emergency Medicine

## 2018-01-29 ENCOUNTER — Other Ambulatory Visit: Payer: Self-pay

## 2018-01-29 ENCOUNTER — Encounter (HOSPITAL_BASED_OUTPATIENT_CLINIC_OR_DEPARTMENT_OTHER): Payer: Self-pay | Admitting: *Deleted

## 2018-01-29 ENCOUNTER — Emergency Department (HOSPITAL_BASED_OUTPATIENT_CLINIC_OR_DEPARTMENT_OTHER): Payer: Medicare Other

## 2018-01-29 DIAGNOSIS — R110 Nausea: Secondary | ICD-10-CM | POA: Diagnosis not present

## 2018-01-29 DIAGNOSIS — N3001 Acute cystitis with hematuria: Secondary | ICD-10-CM | POA: Insufficient documentation

## 2018-01-29 DIAGNOSIS — R05 Cough: Secondary | ICD-10-CM | POA: Diagnosis not present

## 2018-01-29 DIAGNOSIS — E119 Type 2 diabetes mellitus without complications: Secondary | ICD-10-CM | POA: Diagnosis not present

## 2018-01-29 DIAGNOSIS — Z794 Long term (current) use of insulin: Secondary | ICD-10-CM | POA: Insufficient documentation

## 2018-01-29 DIAGNOSIS — Z856 Personal history of leukemia: Secondary | ICD-10-CM | POA: Insufficient documentation

## 2018-01-29 DIAGNOSIS — J209 Acute bronchitis, unspecified: Secondary | ICD-10-CM | POA: Diagnosis not present

## 2018-01-29 DIAGNOSIS — R509 Fever, unspecified: Secondary | ICD-10-CM | POA: Diagnosis not present

## 2018-01-29 DIAGNOSIS — Z79899 Other long term (current) drug therapy: Secondary | ICD-10-CM | POA: Insufficient documentation

## 2018-01-29 LAB — COMPREHENSIVE METABOLIC PANEL
ALT: 14 U/L (ref 14–54)
ANION GAP: 10 (ref 5–15)
AST: 22 U/L (ref 15–41)
Albumin: 4.1 g/dL (ref 3.5–5.0)
Alkaline Phosphatase: 88 U/L (ref 38–126)
BUN: 19 mg/dL (ref 6–20)
CHLORIDE: 98 mmol/L — AB (ref 101–111)
CO2: 24 mmol/L (ref 22–32)
Calcium: 11 mg/dL — ABNORMAL HIGH (ref 8.9–10.3)
Creatinine, Ser: 1.72 mg/dL — ABNORMAL HIGH (ref 0.44–1.00)
GFR calc Af Amer: 34 mL/min — ABNORMAL LOW (ref >60)
GFR calc non Af Amer: 29 mL/min — ABNORMAL LOW (ref >60)
GLUCOSE: 351 mg/dL — AB (ref 65–99)
POTASSIUM: 4.4 mmol/L (ref 3.5–5.1)
Sodium: 132 mmol/L — ABNORMAL LOW (ref 135–145)
Total Bilirubin: 0.7 mg/dL (ref 0.3–1.2)
Total Protein: 7.1 g/dL (ref 6.5–8.1)

## 2018-01-29 LAB — URINALYSIS, ROUTINE W REFLEX MICROSCOPIC
Bilirubin Urine: NEGATIVE
Glucose, UA: >=500 mg/dL — AB
KETONES UR: NEGATIVE mg/dL
NITRITE: NEGATIVE
PH: 5.5 (ref 5.0–8.0)
Protein, ur: NEGATIVE mg/dL
SPECIFIC GRAVITY, URINE: 1.01 (ref 1.005–1.030)

## 2018-01-29 LAB — CBC WITH DIFFERENTIAL/PLATELET
Basophils Absolute: 0.3 10*3/uL — ABNORMAL HIGH (ref 0.0–0.1)
Basophils Relative: 5 %
EOS PCT: 1 %
Eosinophils Absolute: 0.1 10*3/uL (ref 0.0–0.7)
HEMATOCRIT: 32.7 % — AB (ref 36.0–46.0)
Hemoglobin: 11.7 g/dL — ABNORMAL LOW (ref 12.0–15.0)
LYMPHS ABS: 1.8 10*3/uL (ref 0.7–4.0)
Lymphocytes Relative: 30 %
MCH: 26 pg (ref 26.0–34.0)
MCHC: 35.8 g/dL (ref 30.0–36.0)
MCV: 72.7 fL — ABNORMAL LOW (ref 78.0–100.0)
MONOS PCT: 24 %
Monocytes Absolute: 1.4 10*3/uL — ABNORMAL HIGH (ref 0.1–1.0)
NEUTROS ABS: 2.4 10*3/uL (ref 1.7–7.7)
Neutrophils Relative %: 40 %
Platelets: 220 10*3/uL (ref 150–400)
RBC: 4.5 MIL/uL (ref 3.87–5.11)
RDW: 15.2 % (ref 11.5–15.5)
WBC Morphology: INCREASED
WBC: 6 10*3/uL (ref 4.0–10.5)

## 2018-01-29 LAB — URINALYSIS, MICROSCOPIC (REFLEX)

## 2018-01-29 MED ORDER — LEVOFLOXACIN 500 MG PO TABS
500.0000 mg | ORAL_TABLET | Freq: Once | ORAL | Status: AC
  Administered 2018-01-29: 500 mg via ORAL
  Filled 2018-01-29: qty 1

## 2018-01-29 MED ORDER — IPRATROPIUM-ALBUTEROL 0.5-2.5 (3) MG/3ML IN SOLN
3.0000 mL | Freq: Four times a day (QID) | RESPIRATORY_TRACT | Status: DC
  Administered 2018-01-29: 3 mL via RESPIRATORY_TRACT
  Filled 2018-01-29 (×2): qty 3

## 2018-01-29 MED ORDER — AEROCHAMBER PLUS FLO-VU MEDIUM MISC
1.0000 | Freq: Once | Status: AC
  Administered 2018-01-29: 1
  Filled 2018-01-29: qty 1

## 2018-01-29 MED ORDER — LEVOFLOXACIN 500 MG PO TABS: 500.0000 mg | ORAL_TABLET | Freq: Every day | ORAL | 0 refills | Status: DC

## 2018-01-29 MED ORDER — ALBUTEROL SULFATE HFA 108 (90 BASE) MCG/ACT IN AERS
1.0000 | INHALATION_SPRAY | RESPIRATORY_TRACT | Status: DC | PRN
  Administered 2018-01-29: 2 via RESPIRATORY_TRACT
  Filled 2018-01-29: qty 6.7

## 2018-01-29 MED FILL — levoFLOXacin 500 MG TABS: 500 | 7 days supply | Qty: 7 | Fill #0

## 2018-01-29 NOTE — ED Notes (Signed)
NAD at this time. Pt is stable and going home.  

## 2018-01-29 NOTE — ED Notes (Signed)
ED Provider at bedside. 

## 2018-01-29 NOTE — ED Triage Notes (Signed)
Pt reports cough x 3 weeks, saw her pcp 2 weeks ago, no cxr, rx amoxicillin and tessalon perls with no relief. Pt states she stopped taking the amoxicillin before finished because it made her nauseous and started taking an old atbx that she had in her cabinet instead. Pt teaching regarding importance of communication with her pcp about side effects of meds and not stopping atbx without consulting her pcp, also not taking old medications. Pt verbalizes understanding.

## 2018-01-29 NOTE — ED Provider Notes (Signed)
[facility] EMERGENCY DEPARTMENT Provider Note   CSN: [id number] Arrival date & time: 01/29/18  0753     History   Chief Complaint Chief Complaint  Patient presents with  . Cough    HPI Megan Tate is a 72 y.o. female.  Pt presents to the ED today with cough and sob for 2 weeks.  She saw her pcp when it started and was rx'd amox and tessalon perles.  These did not help, they made her nauseous, so she stopped.  She started taking some old abx that were in her cabinet (?name) which also did not help.  Pt is still sob.  She also feels pressure in her bladder when she urinates. Fever of 101 last night.     Past Medical History:  Diagnosis Date  . Cancer ([place]) CLL  . CLL (chronic lymphocytic leukemia) ([place]) 08/16/2014  . Diabetes mellitus without complication ([place])   . Hypertension     Patient Active Problem List   Diagnosis Date Noted  . Benign essential hypertension 02/04/2016  . 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 Controlled diabetes mellitus type II without complication ([place]) 05/69/7948  . Gout 02/04/2016  . Hypercalcemia 02/04/2016  . Obesity, morbid ([place]) 02/04/2016  . Swollen lymph nodes 02/04/2016  . Vaginal yeast infection 09/20/2014  . CLL (chronic lymphocytic leukemia) ([place]) 08/16/2014    History reviewed. No pertinent surgical history.   OB History   None      Home Medications    Prior to Admission medications   Medication Sig Start Date End Date Taking? Authorizing Provider  Blood Glucose Monitoring Suppl (ONE TOUCH ULTRA SYSTEM KIT) W/DEVICE KIT 1 kit by Does not apply route once. Check blood sugar daily before meals and at bedtime. 11/19/14   Volanda Napoleon, MD  cloNIDine (CATAPRES) 0.2 MG tablet Take 0.2 mg by mouth 2 (two) times daily.  02/04/14   [provider]  dorzolamide-timolol (COSOPT) 22.3-6.8 MG/ML ophthalmic solution Place 1 drop into both eyes 2 (two) times daily.  03/18/14   [provider]  doxepin  (SINEQUAN) 10 MG capsule Take 1 capsule by mouth daily. 09/07/16   [provider]  folic acid (FOLVITE) 1 MG tablet Take 2 tablets (2 mg total) by mouth daily. 08/01/17   Volanda Napoleon, MD  glucose blood (ONE TOUCH ULTRA TEST) test strip USE TO TEST BLOOD SUGAR BEFORE MEALS AND AT BEDTIME 09/30/17   Volanda Napoleon, MD  Iron-FA-B Cmp-C-Biot-Probiotic (FUSION PLUS) CAPS Take 1 capsule by mouth every morning. 11/25/17   Volanda Napoleon, MD  JANUMET XR 50-1000 MG TB24 Take 2 tablets by mouth daily. 07/06/15   [provider]  LEVEMIR FLEXTOUCH 100 UNIT/ML Pen Inject 10 Units into the skin every morning.  03/10/14   [provider]  levofloxacin (LEVAQUIN) 500 MG tablet Take 1 tablet (500 mg total) by mouth daily. 01/29/18   Isla Pence, MD  lidocaine-prilocaine (EMLA) cream Apply to Port-A-Cath site 1 hour ride to treatment 05/24/16   [place], Holli Humbles, NP  LINZESS 145 MCG CAPS capsule Take 1 capsule by mouth daily. 09/10/16   [provider]  losartan (COZAAR) 100 MG tablet Take 100 mg by mouth daily. 07/12/15   [provider]  metoprolol (TOPROL-XL) 200 MG 24 hr tablet  05/17/17   [provider]  rosuvastatin (CRESTOR) 20 MG tablet Take 20 mg by mouth daily.    [provider]  spironolactone (ALDACTONE) 50 MG tablet  05/25/17  [provider]  tiZANidine (ZANAFLEX) 2 MG tablet Take 1 tablet (2 mg total) by mouth every 8 (eight) hours as needed for muscle spasms. 11/18/17   Volanda Napoleon, MD  UNABLE TO FIND One Touch Ultra Blood Glucose Monitoring Meter. Dx E11.9 10/02/17   Volanda Napoleon, MD    Family History History reviewed. No pertinent family history.  Social History Social History   Tobacco Use  . Smoking status: Never Smoker  . Smokeless tobacco: Never Used  . Tobacco comment: never used tobacco  Substance Use Topics  . Alcohol use: Not on file  . Drug use: Not on file     Allergies   Tylenol  [acetaminophen]   Review of Systems Review of Systems  Respiratory: Positive for cough.   All other systems reviewed and are negative.    Physical Exam Updated Vital Signs BP (!) 183/97 (BP Location: Right Arm)   Pulse 77   Temp 99.1 F (37.3 C) (Oral)   Resp 20   Ht '5\' 4"'  (1.626 m)   Wt 95.3 kg (210 lb)   SpO2 100%   BMI 36.05 kg/m   Physical Exam  Constitutional: She is oriented to person, place, and time. She appears well-developed and well-nourished.  HENT:  Head: Normocephalic and atraumatic.  Right Ear: External ear normal.  Left Ear: External ear normal.  Nose: Nose normal.  Mouth/Throat: Oropharynx is clear and moist.  Eyes: Pupils are equal, round, and reactive to light. Conjunctivae and EOM are normal.  Neck: Normal range of motion. Neck supple.  Cardiovascular: Normal rate, regular rhythm, normal heart sounds and intact distal pulses.  Pulmonary/Chest: She has wheezes.  Abdominal: Soft. Bowel sounds are normal.  Musculoskeletal: Normal range of motion.  Neurological: She is alert and oriented to person, place, and time.  Skin: Skin is warm. Capillary refill takes less than 2 seconds.  Psychiatric: She has a normal mood and affect. Her behavior is normal. Judgment and thought content normal.  Nursing note and vitals reviewed.    ED Treatments / Results  Labs (all labs ordered are listed, but only abnormal results are displayed) Labs Reviewed  COMPREHENSIVE METABOLIC PANEL - Abnormal; Notable for the following components:      Result Value   Sodium 132 (*)    Chloride 98 (*)    Glucose, Bld 351 (*)    Creatinine, Ser 1.72 (*)    Calcium 11.0 (*)    GFR calc non Af Amer 29 (*)    GFR calc Af Amer 34 (*)    All other components within normal limits  CBC WITH DIFFERENTIAL/PLATELET - Abnormal; Notable for the following components:   Hemoglobin 11.7 (*)    HCT 32.7 (*)    MCV 72.7 (*)    Monocytes Absolute 1.4 (*)    Basophils Absolute 0.3 (*)     All other components within normal limits  URINALYSIS, ROUTINE W REFLEX MICROSCOPIC - Abnormal; Notable for the following components:   APPearance HAZY (*)    Glucose, UA >=500 (*)    Hgb urine dipstick MODERATE (*)    Leukocytes, UA LARGE (*)    All other components within normal limits  URINALYSIS, MICROSCOPIC (REFLEX) - Abnormal; Notable for the following components:   Bacteria, UA MANY (*)    All other components within normal limits    EKG None  Radiology Dg Chest 2 View  Result Date: 01/29/2018 CLINICAL DATA:  Cough, fever. EXAM: CHEST - 2 VIEW COMPARISON:  Radiographs of June 12, 2016. FINDINGS: The heart size and mediastinal contours are within normal limits. Both lungs are clear. No pneumothorax or pleural effusion is noted. Right internal jugular Port-A-Cath is unchanged in position. The visualized skeletal structures are unremarkable. IMPRESSION: No active cardiopulmonary disease. Electronically Signed   By: Marijo Conception, M.D.   On: 01/29/2018 08:45    Procedures Procedures (including critical care time)  Medications Ordered in ED Medications  ipratropium-albuterol (DUONEB) 0.5-2.5 (3) MG/3ML nebulizer solution 3 mL (3 mLs Nebulization Given 01/29/18 0828)  levofloxacin (LEVAQUIN) tablet 500 mg (has no administration in time range)  albuterol (PROVENTIL HFA;VENTOLIN HFA) 108 (90 Base) MCG/ACT inhaler 1-2 puff (has no administration in time range)  AEROCHAMBER PLUS FLO-VU MEDIUM MISC 1 each (has no administration in time range)     Initial Impression / Assessment and Plan / ED Course  I have reviewed the triage vital signs and the nursing notes.  Pertinent labs & imaging results that were available during my care of the patient were reviewed by me and considered in my medical decision making (see chart for details).     Pt is breathing better after the neb, so she is given an inhaler with spacer to take home.  She is started on levaquin to cover both bronchitis  and uti.  Kidney function a little off likely from mild dehydration.  She knows to return if worse and to f/u with pcp.  Final Clinical Impressions(s) / ED Diagnoses   Final diagnoses:  Acute bronchitis, unspecified organism  Acute cystitis with hematuria    ED Discharge Orders        Ordered    levofloxacin (LEVAQUIN) 500 MG tablet  Daily     01/29/18 1012       Isla Pence, MD 01/29/18 1014

## 2018-02-02 ENCOUNTER — Encounter (HOSPITAL_BASED_OUTPATIENT_CLINIC_OR_DEPARTMENT_OTHER): Payer: Self-pay | Admitting: Emergency Medicine

## 2018-02-02 ENCOUNTER — Emergency Department (HOSPITAL_BASED_OUTPATIENT_CLINIC_OR_DEPARTMENT_OTHER)
Admission: EM | Admit: 2018-02-02 | Discharge: 2018-02-02 | Disposition: A | Discharge status: Home / Self Care | Payer: Medicare Other | Attending: Emergency Medicine | Admitting: Emergency Medicine

## 2018-02-02 ENCOUNTER — Other Ambulatory Visit: Payer: Self-pay

## 2018-02-02 DIAGNOSIS — R059 Cough, unspecified: Secondary | ICD-10-CM

## 2018-02-02 DIAGNOSIS — I10 Essential (primary) hypertension: Secondary | ICD-10-CM | POA: Diagnosis not present

## 2018-02-02 DIAGNOSIS — T368X5A Adverse effect of other systemic antibiotics, initial encounter: Secondary | ICD-10-CM | POA: Diagnosis not present

## 2018-02-02 DIAGNOSIS — Z79899 Other long term (current) drug therapy: Secondary | ICD-10-CM | POA: Insufficient documentation

## 2018-02-02 DIAGNOSIS — Z856 Personal history of leukemia: Secondary | ICD-10-CM | POA: Diagnosis not present

## 2018-02-02 DIAGNOSIS — R05 Cough: Secondary | ICD-10-CM | POA: Diagnosis not present

## 2018-02-02 DIAGNOSIS — K644 Residual hemorrhoidal skin tags: Secondary | ICD-10-CM | POA: Insufficient documentation

## 2018-02-02 DIAGNOSIS — E119 Type 2 diabetes mellitus without complications: Secondary | ICD-10-CM | POA: Insufficient documentation

## 2018-02-02 DIAGNOSIS — K5903 Drug induced constipation: Secondary | ICD-10-CM | POA: Diagnosis not present

## 2018-02-02 DIAGNOSIS — Z794 Long term (current) use of insulin: Secondary | ICD-10-CM | POA: Diagnosis not present

## 2018-02-02 MED ORDER — PREDNISONE 20 MG PO TABS: 20.0000 mg | ORAL_TABLET | Freq: Every day | ORAL | 0 refills | Status: DC

## 2018-02-02 MED ORDER — HYDROCORTISONE 2.5 % RE CREA: TOPICAL_CREAM | RECTAL | 1 refills | Status: DC

## 2018-02-02 NOTE — ED Triage Notes (Addendum)
Pt reports cough x 3 weeks, saw her pcp 2 weeks ago, no cxr, rx amoxicillin and tessalon perls with no relief. Then seen her wed and given Levaquin to start and states " it aint no better"and continues to have the Cough and Nausea  - patient states that she also is constipated and thinks it may be from her medications  - she reports that she has a BM yesterday and it was hard  - then about 4 hours later she had to urinated and when she wiped there was blood on the tissue - she feels it was coming from her rectum - patient states she has some a couple times after that but none today - denies any dizziness or feeling lightheaded

## 2018-02-02 NOTE — ED Provider Notes (Signed)
[facility] EMERGENCY DEPARTMENT Provider Note   CSN: [id number] Arrival date & time: 02/02/18  1042     History   Chief Complaint Chief Complaint  Patient presents with  . Rectal Bleeding  . Cough    HPI Megan Tate is a 72 y.o. female.  Patient is a 72 year old female with a history of CLL, diabetes, hypertension who is no longer undergoing cancer treatment presenting today with ongoing cough for the last 3 weeks which is slightly improved, new development of nausea and bright red blood per rectum.  Patient states that she was seen 4 days ago and had an x-ray which was negative for pneumonia but she also had a urinary tract infection and was started on Levaquin which she has been taking as prescribed.  Since starting the Levaquin she has had nausea and constipation.  She normally takes Linzess and stopped taking it 4 days ago when she started the antibiotic because she was not sure if she could take them together.  After straining significantly yesterday to have a hard bowel movement she had some bright red blood on the toilet paper when she went to the bathroom next.  She has some mild pain in the rectum but denies any urinary symptoms.  She still has the cough but denies shortness of breath and does not feel that the cough is any worse.  She is using the inhaler at home with some improvement.  The history is provided by the patient.  Rectal Bleeding  Quality:  Bright red Amount:  Scant Duration:  1 day Timing:  Intermittent Chronicity:  New Context: constipation   Similar prior episodes: no   Relieved by:  Nothing Worsened by:  Defecation Ineffective treatments:  None tried Associated symptoms: no abdominal pain, no fever, no light-headedness and no vomiting   Risk factors: no anticoagulant use   Cough     Past Medical History:  Diagnosis Date  . Cancer ([place]) CLL  . CLL (chronic lymphocytic leukemia) ([place]) 08/16/2014  . Diabetes mellitus without complication  ([place])   . Hypertension     Patient Active Problem List   Diagnosis Date Noted  . Benign essential hypertension 02/04/2016  . 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 Controlled diabetes mellitus type II without complication ([place]) 28/41/3244  . Gout 02/04/2016  . Hypercalcemia 02/04/2016  . Obesity, morbid ([place]) 02/04/2016  . Swollen lymph nodes 02/04/2016  . Vaginal yeast infection 09/20/2014  . CLL (chronic lymphocytic leukemia) ([place]) 08/16/2014    History reviewed. No pertinent surgical history.   OB History   None      Home Medications    Prior to Admission medications   Medication Sig Start Date End Date Taking? Authorizing Provider  Blood Glucose Monitoring Suppl (ONE TOUCH ULTRA SYSTEM KIT) W/DEVICE KIT 1 kit by Does not apply route once. Check blood sugar daily before meals and at bedtime. 11/19/14   Volanda Napoleon, MD  cloNIDine (CATAPRES) 0.2 MG tablet Take 0.2 mg by mouth 2 (two) times daily.  02/04/14   [provider]  dorzolamide-timolol (COSOPT) 22.3-6.8 MG/ML ophthalmic solution Place 1 drop into both eyes 2 (two) times daily.  03/18/14   [provider]  doxepin (SINEQUAN) 10 MG capsule Take 1 capsule by mouth daily. 09/07/16   [provider]  folic acid (FOLVITE) 1 MG tablet Take 2 tablets (2 mg total) by mouth daily. 08/01/17   Volanda Napoleon, MD  glucose blood (ONE TOUCH ULTRA TEST) test strip USE  TO TEST BLOOD SUGAR BEFORE MEALS AND AT BEDTIME 09/30/17   Volanda Napoleon, MD  hydrocortisone (ANUSOL-HC) 2.5 % rectal cream Apply rectally 2 times daily 02/02/18   Blanchie Dessert, MD  Iron-FA-B Cmp-C-Biot-Probiotic (FUSION PLUS) CAPS Take 1 capsule by mouth every morning. 11/25/17   Volanda Napoleon, MD  JANUMET XR 50-1000 MG TB24 Take 2 tablets by mouth daily. 07/06/15   [provider]  LEVEMIR FLEXTOUCH 100 UNIT/ML Pen Inject 10 Units into the skin every morning.  03/10/14   [provider]  levofloxacin (LEVAQUIN)  500 MG tablet Take 1 tablet (500 mg total) by mouth daily. 01/29/18   Isla Pence, MD  lidocaine-prilocaine (EMLA) cream Apply to Port-A-Cath site 1 hour ride to treatment 05/24/16   [place], Holli Humbles, NP  LINZESS 145 MCG CAPS capsule Take 1 capsule by mouth daily. 09/10/16   [provider]  losartan (COZAAR) 100 MG tablet Take 100 mg by mouth daily. 07/12/15   [provider]  metoprolol (TOPROL-XL) 200 MG 24 hr tablet  05/17/17   [provider]  predniSONE (DELTASONE) 20 MG tablet Take 1 tablet (20 mg total) by mouth daily. 02/02/18   Blanchie Dessert, MD  rosuvastatin (CRESTOR) 20 MG tablet Take 20 mg by mouth daily.    [provider]  spironolactone (ALDACTONE) 50 MG tablet  05/25/17   [provider]  tiZANidine (ZANAFLEX) 2 MG tablet Take 1 tablet (2 mg total) by mouth every 8 (eight) hours as needed for muscle spasms. 11/18/17   Volanda Napoleon, MD  UNABLE TO FIND One Touch Ultra Blood Glucose Monitoring Meter. Dx E11.9 10/02/17   Volanda Napoleon, MD    Family History History reviewed. No pertinent family history.  Social History Social History   Tobacco Use  . Smoking status: Never Smoker  . Smokeless tobacco: Never Used  . Tobacco comment: never used tobacco  Substance Use Topics  . Alcohol use: Not on file  . Drug use: Not on file     Allergies   Tylenol [acetaminophen]   Review of Systems Review of Systems  Constitutional: Negative for fever.  Respiratory: Positive for cough.   Gastrointestinal: Positive for hematochezia. Negative for abdominal pain and vomiting.  Neurological: Negative for light-headedness.  All other systems reviewed and are negative.    Physical Exam Updated Vital Signs BP (!) 174/85 (BP Location: Left Arm)   Pulse 60   Temp 98.6 F (37 C) (Oral)   Resp 20   Ht '5\' 4"'  (1.626 m)   Wt 95.3 kg (210 lb)   SpO2 100%   BMI 36.05 kg/m   Physical Exam  Constitutional: She is oriented to  person, place, and time. She appears well-developed and well-nourished. No distress.  HENT:  Head: Normocephalic and atraumatic.  Mouth/Throat: Oropharynx is clear and moist.  Eyes: Pupils are equal, round, and reactive to light. Conjunctivae and EOM are normal.  Neck: Normal range of motion. Neck supple.  Cardiovascular: Normal rate, regular rhythm and intact distal pulses.  No murmur heard. Pulmonary/Chest: Effort normal and breath sounds normal. No respiratory distress. She has no wheezes. She has no rales.  Harsh cough on exam with coarse breath sounds that clear after coughing  Abdominal: Soft. She exhibits no distension. There is no tenderness. There is no rebound and no guarding.  Genitourinary: Rectal exam shows external hemorrhoid and fissure.  Musculoskeletal: Normal range of motion. She exhibits no edema or tenderness.  Neurological: She is alert and  oriented to person, place, and time.  Skin: Skin is warm and dry. No rash noted. No erythema.  Psychiatric: She has a normal mood and affect. Her behavior is normal.  Nursing note and vitals reviewed.    ED Treatments / Results  Labs (all labs ordered are listed, but only abnormal results are displayed) Labs Reviewed - No data to display  EKG None  Radiology No results found.  Procedures Procedures (including critical care time)  Medications Ordered in ED Medications - No data to display   Initial Impression / Assessment and Plan / ED Course  I have reviewed the triage vital signs and the nursing notes.  Pertinent labs & imaging results that were available during my care of the patient were reviewed by me and considered in my medical decision making (see chart for details).     Patient presenting with ongoing cough with recent diagnosis of bronchitis and UTI and treated with Levaquin.  Patient's still coughing today but oxygen saturation is 100% with normal respirations and patient does not appear in distress.   Coarse breath sounds clear after coughing.  She is using an inhaler with some improvement but has been on Levaquin for now 4 days and has not had any significant change.  Also the Levaquin is causing nausea and she also stopped taking her Linzess when she started the antibiotic and has become constipated.  Patient has no abdominal pain on exam or concern for acute abdominal pathology.  She does have hemorrhoids and she was prescribed Anusol.  Also recommended she start back on her Linzess to avoid constipation.  The coughing also may be attributing to the hemorrhoids.  Will give prednisone to see if that also improves her cough.  Cautioned patient that this will cause her blood sugars to elevate.  Her normal blood sugar is between 130-140.  She does take insulin and pills.  Recommended she follow-up with her doctor this week but if her sugar starts running high to call them for adjustments in her insulin. She does not have evidence of fluid overload.  Low suspicion for PE or CHF.  Final Clinical Impressions(s) / ED Diagnoses   Final diagnoses:  Cough  External hemorrhoid, bleeding  Drug-induced constipation    ED Discharge Orders        Ordered    hydrocortisone (ANUSOL-HC) 2.5 % rectal cream     02/02/18 1136    predniSONE (DELTASONE) 20 MG tablet  Daily     02/02/18 1136       Blanchie Dessert, MD 02/02/18 1200

## 2018-02-02 NOTE — Discharge Instructions (Addendum)
You can take Linzess with your antibiotic.  Try taking the antibiotic at night before you go to bed which may improve the nausea.  Use the cream for the hemorrhoids he can also soak in warm baths.   Also after you start the prednisone for the cough know that your blood sugars will elevate.  If they become uncontrollable call your doctor for adjustments in your insulin.

## 2018-02-13 ENCOUNTER — Inpatient Hospital Stay: Payer: Medicare Other | Attending: Hematology & Oncology

## 2018-02-13 VITALS — BP 199/78 | HR 60 | Temp 98.4°F | Resp 20

## 2018-02-13 DIAGNOSIS — C9110 Chronic lymphocytic leukemia of B-cell type not having achieved remission: Secondary | ICD-10-CM | POA: Diagnosis not present

## 2018-02-13 DIAGNOSIS — Z452 Encounter for adjustment and management of vascular access device: Secondary | ICD-10-CM | POA: Diagnosis not present

## 2018-02-13 DIAGNOSIS — Z95828 Presence of other vascular implants and grafts: Secondary | ICD-10-CM

## 2018-02-13 MED ORDER — SODIUM CHLORIDE 0.9% FLUSH
10.0000 mL | INTRAVENOUS | Status: DC | PRN
  Administered 2018-02-13: 10 mL via INTRAVENOUS
  Filled 2018-02-13: qty 10

## 2018-02-13 MED ORDER — HEPARIN SOD (PORK) LOCK FLUSH 100 UNIT/ML IV SOLN
500.0000 [IU] | Freq: Once | INTRAVENOUS | Status: AC
  Administered 2018-02-13: 500 [IU] via INTRAVENOUS
  Filled 2018-02-13: qty 5

## 2018-03-21 ENCOUNTER — Encounter (HOSPITAL_BASED_OUTPATIENT_CLINIC_OR_DEPARTMENT_OTHER): Payer: Self-pay | Admitting: *Deleted

## 2018-03-21 ENCOUNTER — Other Ambulatory Visit: Payer: Self-pay

## 2018-03-21 ENCOUNTER — Emergency Department (HOSPITAL_BASED_OUTPATIENT_CLINIC_OR_DEPARTMENT_OTHER)
Admission: EM | Admit: 2018-03-21 | Discharge: 2018-03-21 | Disposition: A | Discharge status: Home / Self Care | Payer: Medicare Other | Attending: Emergency Medicine | Admitting: Emergency Medicine

## 2018-03-21 DIAGNOSIS — I10 Essential (primary) hypertension: Secondary | ICD-10-CM | POA: Insufficient documentation

## 2018-03-21 DIAGNOSIS — E119 Type 2 diabetes mellitus without complications: Secondary | ICD-10-CM | POA: Insufficient documentation

## 2018-03-21 DIAGNOSIS — Z856 Personal history of leukemia: Secondary | ICD-10-CM | POA: Diagnosis not present

## 2018-03-21 DIAGNOSIS — B9789 Other viral agents as the cause of diseases classified elsewhere: Secondary | ICD-10-CM | POA: Diagnosis not present

## 2018-03-21 DIAGNOSIS — J029 Acute pharyngitis, unspecified: Secondary | ICD-10-CM

## 2018-03-21 DIAGNOSIS — J069 Acute upper respiratory infection, unspecified: Secondary | ICD-10-CM | POA: Insufficient documentation

## 2018-03-21 DIAGNOSIS — J028 Acute pharyngitis due to other specified organisms: Secondary | ICD-10-CM | POA: Diagnosis not present

## 2018-03-21 LAB — RAPID STREP SCREEN (MED CTR MEBANE ONLY): Streptococcus, Group A Screen (Direct): NEGATIVE

## 2018-03-21 MED ORDER — FLUTICASONE PROPIONATE 50 MCG/ACT NA SUSP: 2.0000 | Freq: Every day | NASAL | 0 refills | Status: DC

## 2018-03-21 MED FILL — FLUTICASONE PROP 50 MCG SPR: 50 | 30 days supply | Qty: 16 | Fill #0

## 2018-03-21 NOTE — ED Provider Notes (Signed)
Emergency Department Provider Note   I have reviewed the triage vital signs and the nursing notes.   HISTORY  Chief Complaint Sore Throat   HPI Megan Tate is a 72 y.o. female with PMH of CLL, DM, and HTN's to the emergency department for evaluation of sore throat, nasal congestion, right eye discomfort, and lower back pain.  Symptoms have been ongoing for the past 3 days.  She describes her eye pain as pressure and seems related to her congestion.  She denies any vision changes.  No pain with extraocular movements.  She has sore throat which is painful to swallow but is not difficult for her.  No difficulty breathing.  No productive cough or hemoptysis.  She has not tried any over-the-counter pain medications or cold/flu medicines. No fever/chills.   Past Medical History:  Diagnosis Date  . Cancer ([place]) CLL  . CLL (chronic lymphocytic leukemia) ([place]) 08/16/2014  . Diabetes mellitus without complication ([place])   . Hypertension     Patient Active Problem List   Diagnosis Date Noted  . Benign essential hypertension 02/04/2016  . 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 Controlled diabetes mellitus type II without complication ([place]) 73/42/8768  . Gout 02/04/2016  . Hypercalcemia 02/04/2016  . Obesity, morbid ([place]) 02/04/2016  . Swollen lymph nodes 02/04/2016  . Vaginal yeast infection 09/20/2014  . CLL (chronic lymphocytic leukemia) ([place]) 08/16/2014    History reviewed. No pertinent surgical history.   Allergies Tylenol [acetaminophen]  History reviewed. No pertinent family history.  Social History Social History   Tobacco Use  . Smoking status: Never Smoker  . Smokeless tobacco: Never Used  . Tobacco comment: never used tobacco  Substance Use Topics  . Alcohol use: Never    Alcohol/week: 0.0 oz    Frequency: Never  . Drug use: Never    Review of Systems  Constitutional: No fever/chills Eyes: No visual changes. Positive right eye pressure ENT:  Positive sore throat and nasal congestion.  Cardiovascular: Denies chest pain. Respiratory: Denies shortness of breath. Gastrointestinal: No abdominal pain.  No nausea, no vomiting.  No diarrhea.  No constipation. Genitourinary: Negative for dysuria. Musculoskeletal: Negative for back pain. Skin: Negative for rash. Neurological: Negative for headaches, focal weakness or numbness.  10-point ROS otherwise negative.  ____________________________________________   PHYSICAL EXAM:  VITAL SIGNS: ED Triage Vitals  Enc Vitals Group     BP 03/21/18 0634 (!) 193/90     Pulse Rate 03/21/18 0634 67     Resp 03/21/18 0634 16     Temp 03/21/18 0634 98.8 F (37.1 C)     Temp Source 03/21/18 0634 Oral     SpO2 03/21/18 0634 99 %     Weight 03/21/18 0635 210 lb (95.3 kg)     Height 03/21/18 0635 5\' 4"  (1.626 m)     Pain Score 03/21/18 0650 8    Constitutional: Alert and oriented. Well appearing and in no acute distress. Eyes: Conjunctivae are normal. PERRL. EOMI. Head: Atraumatic. Nose: Positive congestion/rhinnorhea. Mouth/Throat: Mucous membranes are moist.  Oropharynx with erythema but no exudate.  Neck: No stridor.   Cardiovascular: Normal rate, regular rhythm. Good peripheral circulation. Grossly normal heart sounds.   Respiratory: Normal respiratory effort.  No retractions. Lungs CTAB. Gastrointestinal: No distention.  Musculoskeletal: No lower extremity tenderness nor edema. No gross deformities of extremities. Neurologic:  Normal speech and language. No gross focal neurologic deficits are appreciated.  Skin:  Skin is warm, dry and intact. No rash noted.  ____________________________________________   LABS (all labs ordered are listed, but only abnormal results are displayed)  Labs Reviewed  RAPID STREP SCREEN ([facility] & [facility] ONLY)  CULTURE, GROUP A STREP [facility])   ____________________________________________   PROCEDURES  Procedure(s) performed:    Procedures  None ____________________________________________   INITIAL IMPRESSION / ASSESSMENT AND PLAN / ED COURSE  Pertinent labs & imaging results that were available during my care of the patient were reviewed by me and considered in my medical decision making (see chart for details).  Patient presents to the emergency department for evaluation of sore throat with nasal congestion and face/eye pressure.  Patient has a normal ocular exam.  No concern for deeper space infection.  Suspect the patient's discomfort is secondary to sinus pressure.  Ssupect a viral etiology.  She is also complaining of sore throat.  I do not appreciate any peritonsillar abscess.  I have swab for strep and plan for supportive care.  No concern for airway compromise.   Rapid strep is negative.  Patient with no abnormal lung exam findings or hypoxemia to necessitate a chest x-ray.  Symptoms seem to be primarily sinus and throat related at this time.  Will prescribe Flonase for symptomatic relief.  Advised patient to continue taking her blood pressure medications at home and follow-up with her primary care physician regarding her elevated blood pressure.  No evidence of hypertension emergency at this time.   At this time, I do not feel there is any life-threatening condition present. I have reviewed and discussed all results (EKG, imaging, lab, urine as appropriate), exam findings with patient. I have reviewed nursing notes and appropriate previous records.  I feel the patient is safe to be discharged home without further emergent workup. Discussed usual and customary return precautions. Patient and family (if present) verbalize understanding and are comfortable with this plan.  Patient will follow-up with their primary care provider. If they do not have a primary care provider, information for follow-up has been provided to them. All questions have been answered.  ____________________________________________  FINAL  CLINICAL IMPRESSION(S) / ED DIAGNOSES  Final diagnoses:  Viral pharyngitis  Viral upper respiratory tract infection    NEW OUTPATIENT MEDICATIONS STARTED DURING THIS VISIT:  New Prescriptions   FLUTICASONE (FLONASE) 50 MCG/ACT NASAL SPRAY    Place 2 sprays into both nostrils daily for 7 days.    Note:  This document was prepared using Dragon voice recognition software and may include unintentional dictation errors.  Nanda Quinton, MD Emergency Medicine    [place], [facility], MD 03/21/18 [phone]

## 2018-03-21 NOTE — ED Triage Notes (Addendum)
C/o sore throat x3d, R>L, also reports nasal congestion. Mentions "a little" back and R eye pain, (denies: visual change or drainage, ear sx, NVD, fever, sob, CP, dizziness, or cough), no meds taken for sx, BP elevated, "just took BP meds PTA". PCP is Dr. Nancy Fetter.   Alert, NAD, calm, interactive, resps e/u, speaking in clear complete sentences, no dyspnea noted, skin W&D, VSS.

## 2018-03-21 NOTE — Discharge Instructions (Signed)
You have been seen in the Emergency Department (ED) today for a likely viral illness.  Please drink plenty of clear fluids (water, Gatorade, chicken broth, etc).  You may use Tylenol according to label instructions.  Use the Flonase for nasal congestion.   Please follow up with your doctor as listed above.  Call your doctor or return to the Emergency Department (ED) if you are unable to tolerate fluids due to vomiting, have worsening trouble breathing, become extremely tired or difficult to awaken, or if you develop any other symptoms that concern you.

## 2018-03-23 LAB — CULTURE, GROUP A STREP (THRC)

## 2018-03-27 ENCOUNTER — Other Ambulatory Visit: Payer: Self-pay

## 2018-03-27 ENCOUNTER — Inpatient Hospital Stay: Payer: Medicare Other | Attending: Hematology & Oncology

## 2018-03-27 VITALS — BP 176/67 | HR 64 | Temp 98.4°F | Resp 17

## 2018-03-27 DIAGNOSIS — Z452 Encounter for adjustment and management of vascular access device: Secondary | ICD-10-CM | POA: Diagnosis not present

## 2018-03-27 DIAGNOSIS — Z95828 Presence of other vascular implants and grafts: Secondary | ICD-10-CM

## 2018-03-27 DIAGNOSIS — C9110 Chronic lymphocytic leukemia of B-cell type not having achieved remission: Secondary | ICD-10-CM | POA: Diagnosis not present

## 2018-03-27 MED ORDER — FLUCONAZOLE 100 MG PO TABS: 100.0000 mg | ORAL_TABLET | Freq: Every day | ORAL | 0 refills | Status: DC

## 2018-03-27 MED ORDER — SODIUM CHLORIDE 0.9% FLUSH
10.0000 mL | INTRAVENOUS | Status: DC | PRN
  Administered 2018-03-27: 10 mL via INTRAVENOUS
  Filled 2018-03-27: qty 10

## 2018-03-27 MED ORDER — HEPARIN SOD (PORK) LOCK FLUSH 100 UNIT/ML IV SOLN
500.0000 [IU] | Freq: Once | INTRAVENOUS | Status: AC
  Administered 2018-03-27: 500 [IU] via INTRAVENOUS
  Filled 2018-03-27: qty 5

## 2018-03-27 NOTE — Patient Instructions (Signed)
Implanted Port Home Guide An implanted port is a type of central line that is placed under the skin. Central lines are used to provide IV access when treatment or nutrition needs to be given through a person's veins. Implanted ports are used for long-term IV access. An implanted port may be placed because:  You need IV medicine that would be irritating to the small veins in your hands or arms.  You need long-term IV medicines, such as antibiotics.  You need IV nutrition for a long period.  You need frequent blood draws for lab tests.  You need dialysis.  Implanted ports are usually placed in the chest area, but they can also be placed in the upper arm, the abdomen, or the leg. An implanted port has two main parts:  Reservoir. The reservoir is round and will appear as a small, raised area under your skin. The reservoir is the part where a needle is inserted to give medicines or draw blood.  Catheter. The catheter is a thin, flexible tube that extends from the reservoir. The catheter is placed into a large vein. Medicine that is inserted into the reservoir goes into the catheter and then into the vein.  How will I care for my incision site? Do not get the incision site wet. Bathe or shower as directed by your health care provider. How is my port accessed? Special steps must be taken to access the port:  Before the port is accessed, a numbing cream can be placed on the skin. This helps numb the skin over the port site.  Your health care provider uses a sterile technique to access the port. ? Your health care provider must put on a mask and sterile gloves. ? The skin over your port is cleaned carefully with an antiseptic and allowed to dry. ? The port is gently pinched between sterile gloves, and a needle is inserted into the port.  Only "non-coring" port needles should be used to access the port. Once the port is accessed, a blood return should be checked. This helps ensure that the port  is in the vein and is not clogged.  If your port needs to remain accessed for a constant infusion, a clear (transparent) bandage will be placed over the needle site. The bandage and needle will need to be changed every week, or as directed by your health care provider.  Keep the bandage covering the needle clean and dry. Do not get it wet. Follow your health care provider's instructions on how to take a shower or bath while the port is accessed.  If your port does not need to stay accessed, no bandage is needed over the port.  What is flushing? Flushing helps keep the port from getting clogged. Follow your health care provider's instructions on how and when to flush the port. Ports are usually flushed with saline solution or a medicine called heparin. The need for flushing will depend on how the port is used.  If the port is used for intermittent medicines or blood draws, the port will need to be flushed: ? After medicines have been given. ? After blood has been drawn. ? As part of routine maintenance.  If a constant infusion is running, the port may not need to be flushed.  How long will my port stay implanted? The port can stay in for as long as your health care provider thinks it is needed. When it is time for the port to come out, surgery will be   done to remove it. The procedure is similar to the one performed when the port was put in. When should I seek immediate medical care? When you have an implanted port, you should seek immediate medical care if:  You notice a bad smell coming from the incision site.  You have swelling, redness, or drainage at the incision site.  You have more swelling or pain at the port site or the surrounding area.  You have a fever that is not controlled with medicine.  This information is not intended to replace advice given to you by your health care provider. Make sure you discuss any questions you have with your health care provider. Document  Released: 09/17/2005 Document Revised: 02/23/2016 Document Reviewed: 05/25/2013 Elsevier Interactive Patient Education  2017 [employer]vier Inc[employer]  

## 2018-03-31 ENCOUNTER — Other Ambulatory Visit: Payer: Self-pay

## 2018-03-31 ENCOUNTER — Encounter (HOSPITAL_BASED_OUTPATIENT_CLINIC_OR_DEPARTMENT_OTHER): Payer: Self-pay | Admitting: Emergency Medicine

## 2018-03-31 ENCOUNTER — Emergency Department (HOSPITAL_BASED_OUTPATIENT_CLINIC_OR_DEPARTMENT_OTHER)
Admission: EM | Admit: 2018-03-31 | Discharge: 2018-03-31 | Disposition: A | Discharge status: Home / Self Care | Payer: Medicare Other | Attending: Emergency Medicine | Admitting: Emergency Medicine

## 2018-03-31 DIAGNOSIS — J020 Streptococcal pharyngitis: Secondary | ICD-10-CM

## 2018-03-31 DIAGNOSIS — E119 Type 2 diabetes mellitus without complications: Secondary | ICD-10-CM | POA: Insufficient documentation

## 2018-03-31 DIAGNOSIS — Z794 Long term (current) use of insulin: Secondary | ICD-10-CM | POA: Diagnosis not present

## 2018-03-31 DIAGNOSIS — J0100 Acute maxillary sinusitis, unspecified: Secondary | ICD-10-CM | POA: Insufficient documentation

## 2018-03-31 DIAGNOSIS — Z79899 Other long term (current) drug therapy: Secondary | ICD-10-CM | POA: Diagnosis not present

## 2018-03-31 DIAGNOSIS — J029 Acute pharyngitis, unspecified: Secondary | ICD-10-CM | POA: Diagnosis present

## 2018-03-31 DIAGNOSIS — I10 Essential (primary) hypertension: Secondary | ICD-10-CM | POA: Diagnosis not present

## 2018-03-31 LAB — COMPREHENSIVE METABOLIC PANEL
ALBUMIN: 3.6 g/dL (ref 3.5–5.0)
ALT: 12 U/L (ref 0–44)
AST: 18 U/L (ref 15–41)
Alkaline Phosphatase: 84 U/L (ref 38–126)
Anion gap: 8 (ref 5–15)
BUN: 15 mg/dL (ref 8–23)
CHLORIDE: 101 mmol/L (ref 98–111)
CO2: 28 mmol/L (ref 22–32)
CREATININE: 1.34 mg/dL — AB (ref 0.44–1.00)
Calcium: 10.8 mg/dL — ABNORMAL HIGH (ref 8.9–10.3)
GFR calc non Af Amer: 39 mL/min — ABNORMAL LOW (ref >60)
GFR, EST AFRICAN AMERICAN: 45 mL/min — AB (ref >60)
GLUCOSE: 297 mg/dL — AB (ref 70–99)
Potassium: 4 mmol/L (ref 3.5–5.1)
SODIUM: 137 mmol/L (ref 135–145)
Total Bilirubin: 0.7 mg/dL (ref 0.3–1.2)
Total Protein: 6.4 g/dL — ABNORMAL LOW (ref 6.5–8.1)

## 2018-03-31 LAB — CBC WITH DIFFERENTIAL/PLATELET
BASOS ABS: 0.2 10*3/uL — AB (ref 0.0–0.1)
Basophils Relative: 3 %
EOS ABS: 0.3 10*3/uL (ref 0.0–0.7)
Eosinophils Relative: 5 %
HCT: 32.5 % — ABNORMAL LOW (ref 36.0–46.0)
Hemoglobin: 11.2 g/dL — ABNORMAL LOW (ref 12.0–15.0)
Lymphocytes Relative: 21 %
Lymphs Abs: 1.1 10*3/uL (ref 0.7–4.0)
MCH: 25.3 pg — ABNORMAL LOW (ref 26.0–34.0)
MCHC: 34.5 g/dL (ref 30.0–36.0)
MCV: 73.5 fL — ABNORMAL LOW (ref 78.0–100.0)
MONO ABS: 1.2 10*3/uL — AB (ref 0.1–1.0)
Monocytes Relative: 24 %
NEUTROS PCT: 47 %
Neutro Abs: 2.3 10*3/uL (ref 1.7–7.7)
PLATELETS: 233 10*3/uL (ref 150–400)
RBC: 4.42 MIL/uL (ref 3.87–5.11)
RDW: 15.8 % — ABNORMAL HIGH (ref 11.5–15.5)
WBC: 5.1 10*3/uL (ref 4.0–10.5)

## 2018-03-31 LAB — TROPONIN I: Troponin I: <0.03 ng/mL (ref <0.03)

## 2018-03-31 LAB — MONONUCLEOSIS SCREEN: MONO SCREEN: NEGATIVE

## 2018-03-31 MED ORDER — AMOXICILLIN-POT CLAVULANATE 875-125 MG PO TABS
1.0000 | ORAL_TABLET | Freq: Once | ORAL | Status: AC
  Administered 2018-03-31: 1 via ORAL
  Filled 2018-03-31: qty 1

## 2018-03-31 MED ORDER — AMOXICILLIN-POT CLAVULANATE 875-125 MG PO TABS: 1.0000 | ORAL_TABLET | Freq: Two times a day (BID) | ORAL | 0 refills | Status: DC

## 2018-03-31 MED ORDER — GI COCKTAIL ~~LOC~~
30.0000 mL | Freq: Once | ORAL | Status: AC
  Administered 2018-03-31: 30 mL via ORAL
  Filled 2018-03-31: qty 30

## 2018-03-31 MED FILL — AMOX-CLAV 875-125 MG TABLET: 875-125 | 7 days supply | Qty: 14 | Fill #0

## 2018-03-31 NOTE — ED Triage Notes (Signed)
Reports sore throat x 1 month.  Seen previously for same.

## 2018-03-31 NOTE — ED Provider Notes (Addendum)
[facility] Provider Note   CSN: [id number] Arrival date & time: 03/31/18  0630     History   Chief Complaint Chief Complaint  Patient presents with  . Sore Throat    HPI Megan Tate is a 72 y.o. female hx of CLL, DM, HTN here presenting with sore throat, congestion.  Patient states that she has sore throat and congestion for the last month or so.  Has some runny nose as well and some postnasal drip.  She came to the ED about 2 weeks ago and had a rapid strep that was negative and was put on Flonase.  Patient states that she is been using her Flonase with no relief.  More greenish discharge from her nose and worsening postnasal drip.  Patient denies any cough or chest pain.  Patient was noted to be hypertensive in triage but was hypertensive during previous visit.  Since that that she took her blood pressure medicine this morning.  The history is provided by the patient.    Past Medical History:  Diagnosis Date  . Cancer ([place]) CLL  . CLL (chronic lymphocytic leukemia) ([place]) 08/16/2014  . Diabetes mellitus without complication ([place])   . Hypertension     Patient Active Problem List   Diagnosis Date Noted  . Benign essential hypertension 02/04/2016  . 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 Controlled diabetes mellitus type II without complication ([place]) 16/10/930  . Gout 02/04/2016  . Hypercalcemia 02/04/2016  . Obesity, morbid ([place]) 02/04/2016  . Swollen lymph nodes 02/04/2016  . Vaginal yeast infection 09/20/2014  . CLL (chronic lymphocytic leukemia) ([place]) 08/16/2014    History reviewed. No pertinent surgical history.   OB History   None      Home Medications    Prior to Admission medications   Medication Sig Start Date End Date Taking? Authorizing Provider  amoxicillin-clavulanate (AUGMENTIN) 875-125 MG tablet Take 1 tablet by mouth 2 (two) times daily. One po bid x 7 days 03/31/18   Drenda Freeze, MD  Blood Glucose  Monitoring Suppl (ONE TOUCH ULTRA SYSTEM KIT) W/DEVICE KIT 1 kit by Does not apply route once. Check blood sugar daily before meals and at bedtime. 11/19/14   Volanda Napoleon, MD  cloNIDine (CATAPRES) 0.2 MG tablet Take 0.2 mg by mouth 2 (two) times daily.  02/04/14   [provider]  dorzolamide-timolol (COSOPT) 22.3-6.8 MG/ML ophthalmic solution Place 1 drop into both eyes 2 (two) times daily.  03/18/14   [provider]  doxepin (SINEQUAN) 10 MG capsule Take 1 capsule by mouth daily. 09/07/16   [provider]  fluconazole (DIFLUCAN) 100 MG tablet Take 1 tablet (100 mg total) by mouth daily. 03/27/18   Volanda Napoleon, MD  fluticasone (FLONASE) 50 MCG/ACT nasal spray Place 2 sprays into both nostrils daily for 7 days. 03/21/18 03/28/18  Long, Wonda Olds, MD  folic acid (FOLVITE) 1 MG tablet Take 2 tablets (2 mg total) by mouth daily. 08/01/17   Volanda Napoleon, MD  glucose blood (ONE TOUCH ULTRA TEST) test strip USE TO TEST BLOOD SUGAR BEFORE MEALS AND AT BEDTIME 09/30/17   Volanda Napoleon, MD  hydrocortisone (ANUSOL-HC) 2.5 % rectal cream Apply rectally 2 times daily 02/02/18   Blanchie Dessert, MD  Iron-FA-B Cmp-C-Biot-Probiotic (FUSION PLUS) CAPS Take 1 capsule by mouth every morning. 11/25/17   Volanda Napoleon, MD  JANUMET XR 50-1000 MG TB24 Take 2 tablets by mouth daily. 07/06/15   [provider]  LEVEMIR FLEXTOUCH 100 UNIT/ML Pen Inject 10 Units into the skin every morning.  03/10/14   [provider]  levofloxacin (LEVAQUIN) 500 MG tablet Take 1 tablet (500 mg total) by mouth daily. 01/29/18   Isla Pence, MD  lidocaine-prilocaine (EMLA) cream Apply to Port-A-Cath site 1 hour ride to treatment 05/24/16   [place], Holli Humbles, NP  LINZESS 145 MCG CAPS capsule Take 1 capsule by mouth daily. 09/10/16   [provider]  losartan (COZAAR) 100 MG tablet Take 100 mg by mouth daily. 07/12/15   [provider]  metoprolol (TOPROL-XL) 200 MG 24 hr  tablet  05/17/17   [provider]  predniSONE (DELTASONE) 20 MG tablet Take 1 tablet (20 mg total) by mouth daily. 02/02/18   Blanchie Dessert, MD  rosuvastatin (CRESTOR) 20 MG tablet Take 20 mg by mouth daily.    [provider]  spironolactone (ALDACTONE) 50 MG tablet  05/25/17   [provider]  tiZANidine (ZANAFLEX) 2 MG tablet Take 1 tablet (2 mg total) by mouth every 8 (eight) hours as needed for muscle spasms. 11/18/17   Volanda Napoleon, MD  UNABLE TO FIND One Touch Ultra Blood Glucose Monitoring Meter. Dx E11.9 10/02/17   Volanda Napoleon, MD    Family History History reviewed. No pertinent family history.  Social History Social History   Tobacco Use  . Smoking status: Never Smoker  . Smokeless tobacco: Never Used  . Tobacco comment: never used tobacco  Substance Use Topics  . Alcohol use: Never    Alcohol/week: 0.0 oz    Frequency: Never  . Drug use: Never     Allergies   Tylenol [acetaminophen]   Review of Systems Review of Systems  HENT: Positive for sore throat.   Respiratory: Positive for cough.   All other systems reviewed and are negative.    Physical Exam Updated Vital Signs BP (!) 186/68   Pulse (!) 53   Temp 99 F (37.2 C) (Oral)   Resp 15   SpO2 98%   Physical Exam  Constitutional:  Uncomfortable   HENT:  Head: Normocephalic.  Right Ear: Tympanic membrane normal.  Left Ear: Tympanic membrane normal.  Mouth/Throat: Mucous membranes are normal.  Uvula midline. Mild erythema posterior pharynx, tonsils mild exudates bilaterally but no enlarged. + maxillary sinus tenderness bilaterally   Eyes: Pupils are equal, round, and reactive to light. EOM are normal.  Neck: Normal range of motion.  Cardiovascular: Normal rate.  Pulmonary/Chest: Effort normal and breath sounds normal.  Abdominal: Soft.  Neurological: She is alert.  Skin: Skin is warm. Capillary refill takes less than 2 seconds.  Psychiatric: She has a normal mood  and affect.  Nursing note and vitals reviewed.    ED Treatments / Results  Labs (all labs ordered are listed, but only abnormal results are displayed) Labs Reviewed  CBC WITH DIFFERENTIAL/PLATELET - Abnormal; Notable for the following components:      Result Value   Hemoglobin 11.2 (*)    HCT 32.5 (*)    MCV 73.5 (*)    MCH 25.3 (*)    RDW 15.8 (*)    Monocytes Absolute 1.2 (*)    Basophils Absolute 0.2 (*)    All other components within normal limits  COMPREHENSIVE METABOLIC PANEL - Abnormal; Notable for the following components:   Glucose, Bld 297 (*)    Creatinine, Ser 1.34 (*)    Calcium 10.8 (*)    Total Protein 6.4 (*)  GFR calc non Af Amer 39 (*)    GFR calc Af Amer 45 (*)    All other components within normal limits  TROPONIN I  MONONUCLEOSIS SCREEN    EKG None  ED ECG REPORT   Date: 03/31/2018  Rate: 57  Rhythm: normal sinus rhythm  QRS Axis: normal  Intervals: normal  ST/T Wave abnormalities: normal  Conduction Disutrbances:none  Narrative Interpretation:   Old EKG Reviewed: none available  I have personally reviewed the EKG tracing and agree with the computerized printout as noted.   Radiology No results found.  Procedures Procedures (including critical care time)  Medications Ordered in ED Medications  gi cocktail (Maalox,Lidocaine,Donnatal) (30 mLs Oral Given 03/31/18 0856)  amoxicillin-clavulanate (AUGMENTIN) 875-125 MG per tablet 1 tablet (1 tablet Oral Given 03/31/18 1023)     Initial Impression / Assessment and Plan / ED Course  I have reviewed the triage vital signs and the nursing notes.  Pertinent labs & imaging results that were available during my care of the patient were reviewed by me and considered in my medical decision making (see chart for details).     Megan Tate is a 72 y.o. female here with nasal discharge, sinus pain, sore throat. Likely bacterial sinusitis. Rapid strep neg several weeks ago and has some tonsillar  exudates. Consider mono. Also hx of CLL so will get CBC. Will check labs and mono. Will give GI cocktail as well. Likely give augmentin for sinusitis.   10:19 AM WBC nl. Mono neg. Chemistry at baseline. Given augmentin and GI cocktail and felt better. Will give course of augmentin for sinusitis. Stable for discharge.    Final Clinical Impressions(s) / ED Diagnoses   Final diagnoses:  Subacute maxillary sinusitis  Strep throat  Essential hypertension    ED Discharge Orders        Ordered    amoxicillin-clavulanate (AUGMENTIN) 875-125 MG tablet  2 times daily     03/31/18 1020       Drenda Freeze, MD 03/31/18 1019    Drenda Freeze, MD 04/07/18 1341

## 2018-03-31 NOTE — Discharge Instructions (Addendum)
Take augmentin twice daily for sinus infection.   Continue flonase for congestion.   See your doctor.   Stay hydrated   Continue your blood pressure medicines   Return to ER if you have worse sinus congestion or discharge, fever, worse sore throat, dehydration

## 2018-05-08 ENCOUNTER — Inpatient Hospital Stay: Payer: Medicare Other

## 2018-05-08 ENCOUNTER — Inpatient Hospital Stay: Payer: Medicare Other | Attending: Hematology & Oncology | Admitting: Family

## 2018-05-08 ENCOUNTER — Other Ambulatory Visit: Payer: Self-pay

## 2018-05-08 ENCOUNTER — Encounter: Payer: Self-pay | Admitting: Family

## 2018-05-08 VITALS — BP 144/57 | HR 48 | Temp 98.0°F | Resp 18 | Wt 213.0 lb

## 2018-05-08 DIAGNOSIS — Z9221 Personal history of antineoplastic chemotherapy: Secondary | ICD-10-CM | POA: Diagnosis not present

## 2018-05-08 DIAGNOSIS — D500 Iron deficiency anemia secondary to blood loss (chronic): Secondary | ICD-10-CM

## 2018-05-08 DIAGNOSIS — Z79899 Other long term (current) drug therapy: Secondary | ICD-10-CM | POA: Insufficient documentation

## 2018-05-08 DIAGNOSIS — Z1231 Encounter for screening mammogram for malignant neoplasm of breast: Secondary | ICD-10-CM

## 2018-05-08 DIAGNOSIS — Z794 Long term (current) use of insulin: Secondary | ICD-10-CM | POA: Insufficient documentation

## 2018-05-08 DIAGNOSIS — M25551 Pain in right hip: Secondary | ICD-10-CM

## 2018-05-08 DIAGNOSIS — C9110 Chronic lymphocytic leukemia of B-cell type not having achieved remission: Secondary | ICD-10-CM | POA: Diagnosis not present

## 2018-05-08 DIAGNOSIS — Z95828 Presence of other vascular implants and grafts: Secondary | ICD-10-CM

## 2018-05-08 LAB — CBC WITH DIFFERENTIAL (CANCER CENTER ONLY)
BAND NEUTROPHILS: 1 %
Basophils Absolute: 0 10*3/uL (ref 0.0–0.1)
Basophils Relative: 1 %
Blasts: 0 %
EOS ABS: 0.1 10*3/uL (ref 0.0–0.5)
Eosinophils Relative: 2 %
HCT: 30.1 % — ABNORMAL LOW (ref 34.8–46.6)
HEMOGLOBIN: 9.8 g/dL — AB (ref 11.6–15.9)
LYMPHS PCT: 32 %
Lymphs Abs: 1.2 10*3/uL (ref 0.9–3.3)
MCH: 24.7 pg — ABNORMAL LOW (ref 26.0–34.0)
MCHC: 32.6 g/dL (ref 32.0–36.0)
MCV: 75.8 fL — ABNORMAL LOW (ref 81.0–101.0)
MONO ABS: 0.3 10*3/uL (ref 0.1–0.9)
Metamyelocytes Relative: 1 %
Monocytes Relative: 8 %
Myelocytes: 1 %
Neutro Abs: 2.3 10*3/uL (ref 1.5–6.5)
Neutrophils Relative %: 54 %
OTHER: 0 %
PROMYELOCYTES RELATIVE: 0 %
Platelet Count: 216 10*3/uL (ref 145–400)
RBC: 3.97 MIL/uL (ref 3.70–5.32)
RDW: 15.1 % (ref 11.1–15.7)
WBC Count: 3.9 10*3/uL (ref 3.9–10.0)
nRBC: 0 /100 WBC

## 2018-05-08 LAB — RETICULOCYTES
RBC.: 3.97 MIL/uL (ref 3.70–5.45)
Retic Count, Absolute: 43.7 10*3/uL (ref 33.7–90.7)
Retic Ct Pct: 1.1 % (ref 0.7–2.1)

## 2018-05-08 LAB — CMP (CANCER CENTER ONLY)
ALBUMIN: 3.6 g/dL (ref 3.5–5.0)
ALK PHOS: 67 U/L (ref 26–84)
ALT: 14 U/L (ref 10–47)
AST: 19 U/L (ref 11–38)
Anion gap: 0 — ABNORMAL LOW (ref 5–15)
BUN: 12 mg/dL (ref 7–22)
CHLORIDE: 104 mmol/L (ref 98–108)
CO2: 31 mmol/L (ref 18–33)
CREATININE: 1.4 mg/dL — AB (ref 0.60–1.20)
Calcium: 11.1 mg/dL — ABNORMAL HIGH (ref 8.0–10.3)
Glucose, Bld: 246 mg/dL — ABNORMAL HIGH (ref 73–118)
Potassium: 4.5 mmol/L (ref 3.3–4.7)
Sodium: 132 mmol/L (ref 128–145)
Total Bilirubin: 0.7 mg/dL (ref 0.2–1.6)
Total Protein: 6.1 g/dL — ABNORMAL LOW (ref 6.4–8.1)

## 2018-05-08 LAB — LACTATE DEHYDROGENASE: LDH: 189 U/L (ref 98–192)

## 2018-05-08 MED ORDER — SODIUM CHLORIDE 0.9% FLUSH
10.0000 mL | Freq: Once | INTRAVENOUS | Status: AC
  Administered 2018-05-08: 10 mL via INTRAVENOUS
  Filled 2018-05-08: qty 10

## 2018-05-08 MED ORDER — HEPARIN SOD (PORK) LOCK FLUSH 100 UNIT/ML IV SOLN
500.0000 [IU] | Freq: Once | INTRAVENOUS | Status: AC
  Administered 2018-05-08: 500 [IU] via INTRAVENOUS
  Filled 2018-05-08: qty 5

## 2018-05-08 MED ORDER — SPIRONOLACTONE 50 MG PO TABS: 50.0000 mg | ORAL_TABLET | Freq: Every day | ORAL | 0 refills | Status: DC

## 2018-05-08 NOTE — Progress Notes (Signed)
Hematology and Oncology Follow Up Visit  Megan Tate [id number] 10-05-1945 72 y.o. 05/08/2018   Principle Diagnosis:  Chronic lymphocytic leukemia- Trisomy 12  Past Therapy:             Status post 6 cycles of Gazyva/Bendamustine - completed4/2016 Maintenance Gazyva every 3 months s/p cycle 11 -completed 2 years in June 2018  Current Therapy:   Observation   Interim History:  Megan Tate is here today with her husband for follow-up. She is doing fairly well. She is getting over a sinus infection and still has some congestion/drainage with occasional nausea (no vomiting) as well as a sore throat. She has intermittent fatigue. No lymphadenopathy noted on exam.  No fever, chill, cough, rash, chest pain, palpitations, abdominal pain or changes in bowel or bladder habits.  She has not been checking her blood sugars.  She left her PCP and is looking for new one. I gave her the directory for [facility].  She did ask that refill her Aldactone. I told her we would give one refill and she would need to find a new PCP to continue. She gave dosage and frequency as 50 mg PO daily.  No swelling, tenderness, numbness or tingling in her extremities at this time.  Her appetite comes and goes. She is hydrating. Her weight is stable.  No episodes of bleeding, no bruising or petechiae.   ECOG Performance Status: 1 - Symptomatic but completely ambulatory  Medications:  Allergies as of 05/08/2018      Reactions   Tylenol [acetaminophen] Other (See Comments)   Messes with stomach      Medication List        Accurate as of 05/08/18 11:23 AM. Always use your most recent med list.          amoxicillin-clavulanate 875-125 MG tablet Commonly known as:  AUGMENTIN Take 1 tablet by mouth 2 (two) times daily. One po bid x 7 days   cloNIDine 0.2 MG tablet Commonly known as:  CATAPRES Take 0.2 mg by mouth 2 (two) times daily.   dorzolamide-timolol 22.3-6.8 MG/ML ophthalmic solution Commonly  known as:  COSOPT Place 1 drop into both eyes 2 (two) times daily.   doxepin 10 MG capsule Commonly known as:  SINEQUAN Take 1 capsule by mouth daily.   fluconazole 100 MG tablet Commonly known as:  DIFLUCAN Take 1 tablet (100 mg total) by mouth daily.   fluticasone 50 MCG/ACT nasal spray Commonly known as:  FLONASE Place 2 sprays into both nostrils daily for 7 days.   folic acid 1 MG tablet Commonly known as:  FOLVITE Take 2 tablets (2 mg total) by mouth daily.   FUSION PLUS Caps Take 1 capsule by mouth every morning.   glucose blood test strip USE TO TEST BLOOD SUGAR BEFORE MEALS AND AT BEDTIME   hydrocortisone 2.5 % rectal cream Commonly known as:  ANUSOL-HC Apply rectally 2 times daily   JANUMET XR 50-1000 MG Tb24 Generic drug:  SitaGLIPtin-MetFORMIN HCl Take 2 tablets by mouth daily.   LEVEMIR FLEXTOUCH 100 UNIT/ML Pen Generic drug:  Insulin Detemir Inject 10 Units into the skin every morning.   levofloxacin 500 MG tablet Commonly known as:  LEVAQUIN Take 1 tablet (500 mg total) by mouth daily.   lidocaine-prilocaine cream Commonly known as:  EMLA Apply to Port-A-Cath site 1 hour ride to treatment   LINZESS 145 MCG Caps capsule Generic drug:  linaclotide Take 1 capsule by mouth daily.   losartan 100 MG  tablet Commonly known as:  COZAAR Take 100 mg by mouth daily.   metoprolol 200 MG 24 hr tablet Commonly known as:  TOPROL-XL   ONE TOUCH ULTRA SYSTEM KIT w/Device Kit 1 kit by Does not apply route once. Check blood sugar daily before meals and at bedtime.   predniSONE 20 MG tablet Commonly known as:  DELTASONE Take 1 tablet (20 mg total) by mouth daily.   rosuvastatin 20 MG tablet Commonly known as:  CRESTOR Take 20 mg by mouth daily.   spironolactone 50 MG tablet Commonly known as:  ALDACTONE   tiZANidine 2 MG tablet Commonly known as:  ZANAFLEX Take 1 tablet (2 mg total) by mouth every 8 (eight) hours as needed for muscle spasms.   UNABLE  TO FIND One Touch Ultra Blood Glucose Monitoring Meter. Dx E11.9       Allergies:  Allergies  Allergen Reactions  . Tylenol [Acetaminophen] Other (See Comments)    Messes with stomach    Past Medical History, Surgical history, Social history, and Family History were reviewed and updated.  Review of Systems: All other 10 point review of systems is negative.   Physical Exam:  vitals were not taken for this visit.   Wt Readings from Last 3 Encounters:  03/21/18 210 lb (95.3 kg)  02/02/18 210 lb (95.3 kg)  01/29/18 210 lb (95.3 kg)    Ocular: Sclerae unicteric, pupils equal, round and reactive to light Ear-nose-throat: Oropharynx clear, dentition fair Lymphatic: No cervical, supraclavicular or axillary adenopathy Lungs no rales or rhonchi, good excursion bilaterally Heart regular rate and rhythm, no murmur appreciated Abd soft, nontender, positive bowel sounds, no liver or spleen tip palpated on exam, no fluid wave  MSK no focal spinal tenderness, no joint edema Neuro: non-focal, well-oriented, appropriate affect Breasts: Deferred   Lab Results  Component Value Date   WBC 5.1 03/31/2018   HGB 11.2 (L) 03/31/2018   HCT 32.5 (L) 03/31/2018   MCV 73.5 (L) 03/31/2018   PLT 233 03/31/2018   Lab Results  Component Value Date   FERRITIN 286 (H) 11/07/2017   IRON 67 11/07/2017   TIBC 266 11/07/2017   UIBC 199 11/07/2017   IRONPCTSAT 25 11/07/2017   Lab Results  Component Value Date   RETICCTPCT 1.1 11/07/2017   RBC 4.42 03/31/2018   RETICCTABS 65.4 04/07/2014   Lab Results  Component Value Date   KAPLAMBRATIO 1.59 03/28/2017   Lab Results  Component Value Date   IGGSERUM 539 (L) 03/28/2017   IGA 41 (L) 03/30/2015   IGMSERUM <5 (L) 03/28/2017   Lab Results  Component Value Date   TOTALPROTELP 5.5 (L) 03/30/2015   ALBUMINELP 3.4 (L) 03/30/2015   A1GS 0.3 03/30/2015   A2GS 0.8 03/30/2015   BETS 0.4 03/30/2015   BETA2SER 0.2 03/30/2015   GAMS 0.5 (L)  03/30/2015   MSPIKE Not Observed 03/28/2017   SPEI * 03/30/2015     Chemistry      Component Value Date/Time   NA 137 03/31/2018 0900   NA 142 09/05/2017 0751   K 4.0 03/31/2018 0900   K 3.8 09/05/2017 0751   CL 101 03/31/2018 0900   CL 104 09/05/2017 0751   CO2 28 03/31/2018 0900   CO2 27 09/05/2017 0751   BUN 15 03/31/2018 0900   BUN 11 09/05/2017 0751   CREATININE 1.34 (H) 03/31/2018 0900   CREATININE 1.20 01/02/2018 0900   CREATININE 1.1 09/05/2017 0751      Component Value Date/Time  CALCIUM 10.8 (H) 03/31/2018 0900   CALCIUM 10.8 (H) 09/05/2017 0751   ALKPHOS 84 03/31/2018 0900   ALKPHOS 88 (H) 09/05/2017 0751   AST 18 03/31/2018 0900   AST 20 01/02/2018 0900   ALT 12 03/31/2018 0900   ALT 16 01/02/2018 0900   ALT 14 09/05/2017 0751   BILITOT 0.7 03/31/2018 0900   BILITOT 0.8 01/02/2018 0900      Impression and Plan: Megan Tate is a pleasant 72 yo African American female with chronic lymphocytic leukemia. She completed treatment with Gazyva/Bendamustine in April 2016 and maintenance Gazyva in June 2018.  We will see what her iron studies show. She is on an oral supplement. If low we will change to IV iron.  She will try to check her blood sugars more often and establish with another primary care.  Aldactone prescription sent to her [facility].  We will plan to see her back in another 3 months for follow-up.  They will contact our office with any questions or concerns. We can certainly see her sooner if need be.   Laverna Peace, NP 8/8/201911:23 AM

## 2018-05-08 NOTE — Patient Instructions (Signed)
Implanted Port Home Guide An implanted port is a type of central line that is placed under the skin. Central lines are used to provide IV access when treatment or nutrition needs to be given through a person's veins. Implanted ports are used for long-term IV access. An implanted port may be placed because:  You need IV medicine that would be irritating to the small veins in your hands or arms.  You need long-term IV medicines, such as antibiotics.  You need IV nutrition for a long period.  You need frequent blood draws for lab tests.  You need dialysis.  Implanted ports are usually placed in the chest area, but they can also be placed in the upper arm, the abdomen, or the leg. An implanted port has two main parts:  Reservoir. The reservoir is round and will appear as a small, raised area under your skin. The reservoir is the part where a needle is inserted to give medicines or draw blood.  Catheter. The catheter is a thin, flexible tube that extends from the reservoir. The catheter is placed into a large vein. Medicine that is inserted into the reservoir goes into the catheter and then into the vein.  How will I care for my incision site? Do not get the incision site wet. Bathe or shower as directed by your health care provider. How is my port accessed? Special steps must be taken to access the port:  Before the port is accessed, a numbing cream can be placed on the skin. This helps numb the skin over the port site.  Your health care provider uses a sterile technique to access the port. ? Your health care provider must put on a mask and sterile gloves. ? The skin over your port is cleaned carefully with an antiseptic and allowed to dry. ? The port is gently pinched between sterile gloves, and a needle is inserted into the port.  Only "non-coring" port needles should be used to access the port. Once the port is accessed, a blood return should be checked. This helps ensure that the port  is in the vein and is not clogged.  If your port needs to remain accessed for a constant infusion, a clear (transparent) bandage will be placed over the needle site. The bandage and needle will need to be changed every week, or as directed by your health care provider.  Keep the bandage covering the needle clean and dry. Do not get it wet. Follow your health care provider's instructions on how to take a shower or bath while the port is accessed.  If your port does not need to stay accessed, no bandage is needed over the port.  What is flushing? Flushing helps keep the port from getting clogged. Follow your health care provider's instructions on how and when to flush the port. Ports are usually flushed with saline solution or a medicine called heparin. The need for flushing will depend on how the port is used.  If the port is used for intermittent medicines or blood draws, the port will need to be flushed: ? After medicines have been given. ? After blood has been drawn. ? As part of routine maintenance.  If a constant infusion is running, the port may not need to be flushed.  How long will my port stay implanted? The port can stay in for as long as your health care provider thinks it is needed. When it is time for the port to come out, surgery will be   done to remove it. The procedure is similar to the one performed when the port was put in. When should I seek immediate medical care? When you have an implanted port, you should seek immediate medical care if:  You notice a bad smell coming from the incision site.  You have swelling, redness, or drainage at the incision site.  You have more swelling or pain at the port site or the surrounding area.  You have a fever that is not controlled with medicine.  This information is not intended to replace advice given to you by your health care provider. Make sure you discuss any questions you have with your health care provider. Document  Released: 09/17/2005 Document Revised: 02/23/2016 Document Reviewed: 05/25/2013 Elsevier Interactive Patient Education  2017 [employer]vier Inc[employer]  

## 2018-05-09 LAB — IRON AND TIBC
IRON: 52 ug/dL (ref 41–142)
Saturation Ratios: 19 % — ABNORMAL LOW (ref 21–57)
TIBC: 269 ug/dL (ref 236–444)
UIBC: 217 ug/dL

## 2018-05-09 LAB — FERRITIN: Ferritin: 337 ng/mL — ABNORMAL HIGH (ref 11–307)

## 2018-05-13 ENCOUNTER — Other Ambulatory Visit: Payer: Self-pay | Admitting: Family

## 2018-05-13 DIAGNOSIS — D508 Other iron deficiency anemias: Secondary | ICD-10-CM

## 2018-05-13 DIAGNOSIS — D509 Iron deficiency anemia, unspecified: Secondary | ICD-10-CM | POA: Insufficient documentation

## 2018-05-14 DIAGNOSIS — F4325 Adjustment disorder with mixed disturbance of emotions and conduct: Secondary | ICD-10-CM | POA: Diagnosis not present

## 2018-05-14 DIAGNOSIS — F411 Generalized anxiety disorder: Secondary | ICD-10-CM | POA: Diagnosis not present

## 2018-06-03 ENCOUNTER — Emergency Department (HOSPITAL_BASED_OUTPATIENT_CLINIC_OR_DEPARTMENT_OTHER)
Admission: EM | Admit: 2018-06-03 | Discharge: 2018-06-03 | Disposition: A | Discharge status: Home / Self Care | Payer: Medicare Other | Attending: Emergency Medicine | Admitting: Emergency Medicine

## 2018-06-03 ENCOUNTER — Encounter (HOSPITAL_BASED_OUTPATIENT_CLINIC_OR_DEPARTMENT_OTHER): Payer: Self-pay | Admitting: Emergency Medicine

## 2018-06-03 ENCOUNTER — Emergency Department (HOSPITAL_BASED_OUTPATIENT_CLINIC_OR_DEPARTMENT_OTHER): Payer: Medicare Other

## 2018-06-03 ENCOUNTER — Other Ambulatory Visit: Payer: Self-pay

## 2018-06-03 DIAGNOSIS — Z856 Personal history of leukemia: Secondary | ICD-10-CM | POA: Diagnosis not present

## 2018-06-03 DIAGNOSIS — R0981 Nasal congestion: Secondary | ICD-10-CM | POA: Diagnosis not present

## 2018-06-03 DIAGNOSIS — Z794 Long term (current) use of insulin: Secondary | ICD-10-CM | POA: Diagnosis not present

## 2018-06-03 DIAGNOSIS — H40053 Ocular hypertension, bilateral: Secondary | ICD-10-CM | POA: Diagnosis not present

## 2018-06-03 DIAGNOSIS — M25512 Pain in left shoulder: Secondary | ICD-10-CM | POA: Diagnosis not present

## 2018-06-03 DIAGNOSIS — E119 Type 2 diabetes mellitus without complications: Secondary | ICD-10-CM | POA: Diagnosis not present

## 2018-06-03 DIAGNOSIS — S4992XA Unspecified injury of left shoulder and upper arm, initial encounter: Secondary | ICD-10-CM | POA: Diagnosis not present

## 2018-06-03 DIAGNOSIS — R05 Cough: Secondary | ICD-10-CM | POA: Diagnosis not present

## 2018-06-03 DIAGNOSIS — I10 Essential (primary) hypertension: Secondary | ICD-10-CM | POA: Diagnosis not present

## 2018-06-03 DIAGNOSIS — Z79899 Other long term (current) drug therapy: Secondary | ICD-10-CM | POA: Diagnosis not present

## 2018-06-03 MED ORDER — CETIRIZINE HCL 5 MG PO TABS: 5.0000 mg | ORAL_TABLET | Freq: Every day | ORAL | 0 refills | Status: DC

## 2018-06-03 MED ORDER — FLUTICASONE PROPIONATE 50 MCG/ACT NA SUSP: 2.0000 | Freq: Every day | NASAL | 0 refills | Status: DC

## 2018-06-03 MED FILL — CETIRIZINE HCL 10 MG TABLET: 10 | 100 days supply | Qty: 100 | Fill #0

## 2018-06-03 MED FILL — FLUTICASONE PROP 50 MCG SPR: 50 | 30 days supply | Qty: 16 | Fill #0

## 2018-06-03 NOTE — ED Triage Notes (Signed)
Pt c/o LT shoulder pain since falling off bed Sat pm; also reports nasal congestion and coughing up mucous for "over 2 months"

## 2018-06-03 NOTE — ED Provider Notes (Signed)
[facility] EMERGENCY DEPARTMENT Provider Note   CSN: [id number] Arrival date & time: 06/03/18  1028     History   Chief Complaint Chief Complaint  Patient presents with  . Shoulder Pain  . Nasal Congestion    HPI Megan Tate is a 72 y.o. female.  Megan Tate is a 72 y.o. Female with history of CLL not currently on treatment, diabetes and hypertension, who presents to the emergency department for evaluation of left shoulder pain after falling from her bed Saturday evening.  She reports she did not hit her head and denies neck or back pain.  Reports she has had pain in her shoulder and initially had some pain in her left hip but this is completely resolved.  She is able to move the arm but reports it is uncomfortable.  She has not noticed any redness, bruising or swelling.  No fevers or chills.  No numbness, tingling or weakness in the arm or hand.  She did has taken some aspirin or ibuprofen intermittently but has not taken anything consistently to treat this pain.  She also reports some intermittent nasal congestion and coughing.  She reports this is been going on for over 2 months, and seems to be somewhat but not completely resolved she has been on multiple courses of biotics, is not using anything else to try and manage the symptoms.     Past Medical History:  Diagnosis Date  . Cancer ([place]) CLL  . CLL (chronic lymphocytic leukemia) ([place]) 08/16/2014  . Diabetes mellitus without complication ([place])   . Hypertension     Patient Active Problem List   Diagnosis Date Noted  . IDA (iron deficiency anemia) 05/13/2018  . Benign essential hypertension 02/04/2016  . 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 Controlled diabetes mellitus type II without complication ([place]) 70/10/7492  . Gout 02/04/2016  . Hypercalcemia 02/04/2016  . Obesity, morbid ([place]) 02/04/2016  . Swollen lymph nodes 02/04/2016  . Vaginal yeast infection 09/20/2014  . CLL (chronic lymphocytic  leukemia) ([place]) 08/16/2014    History reviewed. No pertinent surgical history.   OB History   None      Home Medications    Prior to Admission medications   Medication Sig Start Date End Date Taking? Authorizing Provider  Blood Glucose Monitoring Suppl (ONE TOUCH ULTRA SYSTEM KIT) W/DEVICE KIT 1 kit by Does not apply route once. Check blood sugar daily before meals and at bedtime. 11/19/14   Volanda Napoleon, MD  cetirizine (ZYRTEC) 5 MG tablet Take 1 tablet (5 mg total) by mouth daily. 06/03/18   Jacqlyn Larsen, PA-C  cloNIDine (CATAPRES) 0.2 MG tablet Take 0.2 mg by mouth 2 (two) times daily.  02/04/14   [provider]  dorzolamide-timolol (COSOPT) 22.3-6.8 MG/ML ophthalmic solution Place 1 drop into both eyes 2 (two) times daily.  03/18/14   [provider]  doxepin (SINEQUAN) 10 MG capsule Take 1 capsule by mouth daily. 09/07/16   [provider]  fluconazole (DIFLUCAN) 100 MG tablet Take 1 tablet (100 mg total) by mouth daily. 03/27/18   Volanda Napoleon, MD  fluticasone (FLONASE) 50 MCG/ACT nasal spray Place 2 sprays into both nostrils daily. 06/03/18   Jacqlyn Larsen, PA-C  folic acid (FOLVITE) 1 MG tablet Take 2 tablets (2 mg total) by mouth daily. 08/01/17   Volanda Napoleon, MD  glucose blood (ONE TOUCH ULTRA TEST) test strip USE TO TEST BLOOD SUGAR BEFORE MEALS AND AT BEDTIME 09/30/17  Volanda Napoleon, MD  hydrocortisone (ANUSOL-HC) 2.5 % rectal cream Apply rectally 2 times daily 02/02/18   Blanchie Dessert, MD  Iron-FA-B Cmp-C-Biot-Probiotic (FUSION PLUS) CAPS Take 1 capsule by mouth every morning. 11/25/17   Volanda Napoleon, MD  JANUMET XR 50-1000 MG TB24 Take 2 tablets by mouth daily. 07/06/15   [provider]  LEVEMIR FLEXTOUCH 100 UNIT/ML Pen Inject 10 Units into the skin every morning.  03/10/14   [provider]  levofloxacin (LEVAQUIN) 500 MG tablet Take 1 tablet (500 mg total) by mouth daily. 01/29/18   Isla Pence, MD   lidocaine-prilocaine (EMLA) cream Apply to Port-A-Cath site 1 hour ride to treatment 05/24/16   [place], Holli Humbles, NP  LINZESS 145 MCG CAPS capsule Take 1 capsule by mouth daily. 09/10/16   [provider]  losartan (COZAAR) 100 MG tablet Take 100 mg by mouth daily. 07/12/15   [provider]  metoprolol (TOPROL-XL) 200 MG 24 hr tablet  05/17/17   [provider]  predniSONE (DELTASONE) 20 MG tablet Take 1 tablet (20 mg total) by mouth daily. 02/02/18   Blanchie Dessert, MD  rosuvastatin (CRESTOR) 20 MG tablet Take 20 mg by mouth daily.    [provider]  spironolactone (ALDACTONE) 50 MG tablet Take 1 tablet (50 mg total) by mouth daily. 05/08/18   [place], Holli Humbles, NP  tiZANidine (ZANAFLEX) 2 MG tablet Take 1 tablet (2 mg total) by mouth every 8 (eight) hours as needed for muscle spasms. 11/18/17   Volanda Napoleon, MD  UNABLE TO FIND One Touch Ultra Blood Glucose Monitoring Meter. Dx E11.9 10/02/17   Volanda Napoleon, MD    Family History No family history on file.  Social History Social History   Tobacco Use  . Smoking status: Never Smoker  . Smokeless tobacco: Never Used  . Tobacco comment: never used tobacco  Substance Use Topics  . Alcohol use: Never    Alcohol/week: 0.0 standard drinks    Frequency: Never  . Drug use: Never     Allergies   Tylenol [acetaminophen]   Review of Systems Review of Systems  Constitutional: Negative for chills and fever.  HENT: Positive for congestion and rhinorrhea. Negative for ear discharge, ear pain and sore throat.   Eyes: Negative for visual disturbance.  Respiratory: Positive for cough. Negative for shortness of breath.   Cardiovascular: Negative for chest pain.  Gastrointestinal: Negative for abdominal pain, nausea and vomiting.  Musculoskeletal: Positive for arthralgias. Negative for joint swelling.  Skin: Negative for color change, rash and wound.  Neurological: Negative for weakness and  numbness.     Physical Exam Updated Vital Signs BP (!) 147/71 (BP Location: Left Arm)   Pulse 69   Temp 98.7 F (37.1 C) (Oral)   Resp 20   Ht _0  (1.626 m)   Wt 90.7 kg   SpO2 98%   BMI 34.33 kg/m   Physical Exam  Constitutional: She appears well-developed and well-nourished. No distress.  HENT:  Head: Normocephalic and atraumatic.  TMs clear with good landmarks, minimal nasal mucosa edema with scant clear rhinorrhea, posterior oropharynx clear and moist, without erythema, edema or exudates Scalp is atraumatic, no palpable tenderness, hematoma or deformity  Eyes: Right eye exhibits no discharge. Left eye exhibits no discharge.  Neck: Neck supple.  No midline C-spine tenderness, normal range of motion  Cardiovascular: Normal rate, regular rhythm, normal heart sounds and intact distal pulses.  Pulmonary/Chest: Effort normal and breath sounds normal.  No stridor. No respiratory distress. She has no wheezes. She has no rales.  Respirations equal and unlabored, patient able to speak in full sentences, lungs clear to auscultation bilaterally  Musculoskeletal:  Tenderness to palpation over the left shoulder, primarily over the anterior aspect, no palpable deformity, no ecchymosis, no swelling, erythema or warmth.  Passive range of motion is intact, active range of motion with some discomfort, no tenderness through the upper arm or forearm, no tenderness with range of motion over the elbow or wrist.  2+ radial pulse, 5/5 strength, sensation intact.  All compartments soft. No midline spinal tenderness, no tenderness over bilateral hips  Lymphadenopathy:    She has no cervical adenopathy.  Neurological: She is alert. Coordination normal.  Skin: Skin is warm and dry. Capillary refill takes less than 2 seconds. She is not diaphoretic.  Psychiatric: She has a normal mood and affect. Her behavior is normal.  Nursing note and vitals reviewed.    ED Treatments / Results  Labs (all labs  ordered are listed, but only abnormal results are displayed) Labs Reviewed - No data to display  EKG None  Radiology Dg Chest 2 View  Result Date: 06/03/2018 CLINICAL DATA:  Cough and sinus drainage. History of chronic lymphocytic leukemia EXAM: CHEST - 2 VIEW COMPARISON:  Jan 29, 2018 FINDINGS: Port-A-Cath tip is in the superior vena cava near the cavoatrial junction. No pneumothorax. No edema or consolidation. Heart is upper normal in size with pulmonary vascularity normal. No adenopathy. No evident bone lesions. IMPRESSION: Port-A-Cath tip in superior vena cava. No pneumothorax. No edema or consolidation. Stable cardiac silhouette. Electronically Signed   By: Lowella Grip III M.D.   On: 06/03/2018 11:42   Dg Shoulder Left  Result Date: 06/03/2018 CLINICAL DATA:  Pain following recent fall. History of chronic lymphocytic leukemia EXAM: LEFT SHOULDER - 2+ VIEW COMPARISON:  None. FINDINGS: Oblique, Y scapular, and axillary images were obtained. No fracture or dislocation. Joint spaces appear normal. No erosive change. No blastic or lytic lesions. Visualized left lung clear. IMPRESSION: No fracture or dislocation. No blastic or lytic bone lesion. No evident arthropathy. Electronically Signed   By: Lowella Grip III M.D.   On: 06/03/2018 11:42    Procedures Procedures (including critical care time)  Medications Ordered in ED Medications - No data to display   Initial Impression / Assessment and Plan / ED Course  I have reviewed the triage vital signs and the nursing notes.  Pertinent labs & imaging results that were available during my care of the patient were reviewed by me and considered in my medical decision making (see chart for details).  Patient presents for evaluation of left shoulder pain after no swelling, erythema or warmth, no concern for septic arthritis.  Range of motion is intact and in the left upper extremity is neurovascularly intact.  X-ray without evidence of  traumatic injury.  Recommend OTC pain medication and lidocaine patches.  Patient also complaining of nasal congestion and intermittent cough, has been seen for this antibiotics, reports symptoms persist although seem to be somewhat improving, on exam she has minimal mucosal edema or nasal drainage, your oropharynx is clear and lungs are clear.  Chest x-ray without any infiltrate or other active cardiopulmonary disease.  No lytic or blastic lesions.  Vision is afebrile with normal vitals.  Recommend treatment with Zyrtec and Flonase and follow-up with her primary care doctor.   At this time there does not appear to be any evidence of an  acute emergency medical condition and the patient appears stable for discharge with appropriate outpatient follow up.Diagnosis was discussed with patient who verbalizes understanding and is agreeable to discharge. Pt case discussed with Dr. Billy Fischer who agrees with my plan.  Final Clinical Impressions(s) / ED Diagnoses   Final diagnoses:  Acute pain of left shoulder  Nasal congestion    ED Discharge Orders         Ordered    fluticasone (FLONASE) 50 MCG/ACT nasal spray  Daily     06/03/18 1208    cetirizine (ZYRTEC) 5 MG tablet  Daily     06/03/18 1208           Jacqlyn Larsen, [place] 06/03/18 2034    Gareth Morgan, MD 06/04/18 1501

## 2018-06-03 NOTE — Discharge Instructions (Signed)
X-rays of your shoulder look good this is likely soft tissue injury.  Continue using home over-the-counter pain medications as well as over-the-counter salon pas lidocaine patches, ice and heat.  Your chest x-ray shows no evidence of pneumonia today.  Continue to treat nasal congestion with Zyrtec and Flonase and follow-up with your primary care doctor regarding this.  Return to the emergency department for fevers, chest pain, shortness of breath, severe headache or neck stiffness, worsening shoulder pain, numbness or weakness in your arm or any other new or concerning symptoms.

## 2018-06-09 DIAGNOSIS — F411 Generalized anxiety disorder: Secondary | ICD-10-CM | POA: Diagnosis not present

## 2018-06-09 DIAGNOSIS — F4325 Adjustment disorder with mixed disturbance of emotions and conduct: Secondary | ICD-10-CM | POA: Diagnosis not present

## 2018-06-19 ENCOUNTER — Other Ambulatory Visit: Payer: Self-pay

## 2018-06-19 ENCOUNTER — Inpatient Hospital Stay: Payer: Medicare Other | Attending: Hematology & Oncology

## 2018-06-19 VITALS — BP 181/75 | HR 64 | Temp 98.2°F | Resp 18

## 2018-06-19 DIAGNOSIS — Z452 Encounter for adjustment and management of vascular access device: Secondary | ICD-10-CM | POA: Diagnosis not present

## 2018-06-19 DIAGNOSIS — Z95828 Presence of other vascular implants and grafts: Secondary | ICD-10-CM

## 2018-06-19 DIAGNOSIS — C9110 Chronic lymphocytic leukemia of B-cell type not having achieved remission: Secondary | ICD-10-CM | POA: Insufficient documentation

## 2018-06-19 MED ORDER — SODIUM CHLORIDE 0.9% FLUSH
10.0000 mL | INTRAVENOUS | Status: DC | PRN
  Administered 2018-06-19: 10 mL via INTRAVENOUS
  Filled 2018-06-19: qty 10

## 2018-06-19 MED ORDER — HEPARIN SOD (PORK) LOCK FLUSH 100 UNIT/ML IV SOLN
500.0000 [IU] | Freq: Once | INTRAVENOUS | Status: AC
  Administered 2018-06-19: 500 [IU] via INTRAVENOUS
  Filled 2018-06-19: qty 5

## 2018-06-19 NOTE — Patient Instructions (Signed)
Implanted Port Home Guide An implanted port is a type of central line that is placed under the skin. Central lines are used to provide IV access when treatment or nutrition needs to be given through a person's veins. Implanted ports are used for long-term IV access. An implanted port may be placed because:  You need IV medicine that would be irritating to the small veins in your hands or arms.  You need long-term IV medicines, such as antibiotics.  You need IV nutrition for a long period.  You need frequent blood draws for lab tests.  You need dialysis.  Implanted ports are usually placed in the chest area, but they can also be placed in the upper arm, the abdomen, or the leg. An implanted port has two main parts:  Reservoir. The reservoir is round and will appear as a small, raised area under your skin. The reservoir is the part where a needle is inserted to give medicines or draw blood.  Catheter. The catheter is a thin, flexible tube that extends from the reservoir. The catheter is placed into a large vein. Medicine that is inserted into the reservoir goes into the catheter and then into the vein.  How will I care for my incision site? Do not get the incision site wet. Bathe or shower as directed by your health care provider. How is my port accessed? Special steps must be taken to access the port:  Before the port is accessed, a numbing cream can be placed on the skin. This helps numb the skin over the port site.  Your health care provider uses a sterile technique to access the port. ? Your health care provider must put on a mask and sterile gloves. ? The skin over your port is cleaned carefully with an antiseptic and allowed to dry. ? The port is gently pinched between sterile gloves, and a needle is inserted into the port.  Only "non-coring" port needles should be used to access the port. Once the port is accessed, a blood return should be checked. This helps ensure that the port  is in the vein and is not clogged.  If your port needs to remain accessed for a constant infusion, a clear (transparent) bandage will be placed over the needle site. The bandage and needle will need to be changed every week, or as directed by your health care provider.  Keep the bandage covering the needle clean and dry. Do not get it wet. Follow your health care provider's instructions on how to take a shower or bath while the port is accessed.  If your port does not need to stay accessed, no bandage is needed over the port.  What is flushing? Flushing helps keep the port from getting clogged. Follow your health care provider's instructions on how and when to flush the port. Ports are usually flushed with saline solution or a medicine called heparin. The need for flushing will depend on how the port is used.  If the port is used for intermittent medicines or blood draws, the port will need to be flushed: ? After medicines have been given. ? After blood has been drawn. ? As part of routine maintenance.  If a constant infusion is running, the port may not need to be flushed.  How long will my port stay implanted? The port can stay in for as long as your health care provider thinks it is needed. When it is time for the port to come out, surgery will be   done to remove it. The procedure is similar to the one performed when the port was put in. When should I seek immediate medical care? When you have an implanted port, you should seek immediate medical care if:  You notice a bad smell coming from the incision site.  You have swelling, redness, or drainage at the incision site.  You have more swelling or pain at the port site or the surrounding area.  You have a fever that is not controlled with medicine.  This information is not intended to replace advice given to you by your health care provider. Make sure you discuss any questions you have with your health care provider. Document  Released: 09/17/2005 Document Revised: 02/23/2016 Document Reviewed: 05/25/2013 Elsevier Interactive Patient Education  2017 [employer]vier Inc[employer]  

## 2018-07-15 ENCOUNTER — Ambulatory Visit (INDEPENDENT_AMBULATORY_CARE_PROVIDER_SITE_OTHER): Payer: Medicare Other | Admitting: Family Medicine

## 2018-07-15 ENCOUNTER — Other Ambulatory Visit: Payer: Medicare Other

## 2018-07-15 ENCOUNTER — Encounter: Payer: Self-pay | Admitting: Family Medicine

## 2018-07-15 VITALS — BP 118/70 | HR 59 | Temp 98.1°F | Ht 64.0 in | Wt 205.0 lb

## 2018-07-15 DIAGNOSIS — I10 Essential (primary) hypertension: Secondary | ICD-10-CM

## 2018-07-15 DIAGNOSIS — R739 Hyperglycemia, unspecified: Secondary | ICD-10-CM

## 2018-07-15 DIAGNOSIS — R2681 Unsteadiness on feet: Secondary | ICD-10-CM

## 2018-07-15 DIAGNOSIS — J302 Other seasonal allergic rhinitis: Secondary | ICD-10-CM | POA: Insufficient documentation

## 2018-07-15 DIAGNOSIS — C9110 Chronic lymphocytic leukemia of B-cell type not having achieved remission: Secondary | ICD-10-CM

## 2018-07-15 DIAGNOSIS — Z794 Long term (current) use of insulin: Secondary | ICD-10-CM | POA: Diagnosis not present

## 2018-07-15 DIAGNOSIS — E119 Type 2 diabetes mellitus without complications: Secondary | ICD-10-CM | POA: Diagnosis not present

## 2018-07-15 LAB — MICROALBUMIN / CREATININE URINE RATIO
Creatinine,U: 89.8 mg/dL
MICROALB UR: 2.7 mg/dL — AB (ref 0.0–1.9)
Microalb Creat Ratio: 3 mg/g (ref 0.0–30.0)

## 2018-07-15 MED ORDER — LOSARTAN POTASSIUM 100 MG PO TABS: 100.0000 mg | ORAL_TABLET | Freq: Every day | ORAL | 2 refills | Status: DC

## 2018-07-15 MED ORDER — INSULIN DETEMIR 100 UNIT/ML FLEXPEN: 10.0000 [IU] | PEN_INJECTOR | Freq: Every morning | SUBCUTANEOUS | 3 refills | Status: DC

## 2018-07-15 MED ORDER — METOPROLOL SUCCINATE ER 200 MG PO TB24: 200.0000 mg | ORAL_TABLET | Freq: Every day | ORAL | 2 refills | Status: DC

## 2018-07-15 NOTE — Progress Notes (Signed)
Megan Tate - 72 y.o. female MRN [id number]  Date of birth: 21-Dec-1945  Subjective Chief Complaint  Patient presents with  . Nasal Congestion  . Sinus Problem    HPI Megan Tate is a 72 y.o. female with history of T2DM, HTN, and CLL here today to establish with PCP.  She was referred by her oncologist for primary care.    -Nasal congestion:  Reports off and on nasal congestion x4 months.  Nasal discharge is clear, occasionally with green coloration.  Has sinus pressure without sinus pain.  Denies ear pain, chronic cough, fever or chills.  She has had some relief with cetirizine.  Has flonase but is not using consistently.   -HTN:  Currently on multiple medications for treatment of hypertension including clonidine, aldactone, toprol and losartan.  She reports she is doing well with these.  She denies side effects including symptoms of hypotension.  She is without chest pain, shortness of breath, palpitations, headache or vision changes.   -T2DM:  History of T2DM, currently on Levemir and janumet.  She is doing well with these medications.  She denies side effects including symptoms of hypoglycemia.  Reports blood sugars at home typically at 140 or below.  She does not have a recent A1c.  She reports following a fairly healthy diet.   -Gait instability:  Mild gait instability, does OK with walking but some difficulty in the shower.  Has fallen once.  She does not have grab handles and would like to have a shower bench.    ROS:  A comprehensive ROS was completed and negative except as noted per HPI  Allergies  Allergen Reactions  . Tylenol [Acetaminophen] Other (See Comments)    Messes with stomach    Past Medical History:  Diagnosis Date  . Cancer ([place]) CLL  . CLL (chronic lymphocytic leukemia) ([place]) 08/16/2014  . Diabetes mellitus without complication ([place])   . Hypertension     No past surgical history on file.  Social History   Socioeconomic History  . Marital status:  Single    Spouse name: Not on file  . Number of children: Not on file  . Years of education: Not on file  . Highest education level: Not on file  Occupational History  . Not on file  Social Needs  . Financial resource strain: Not on file  . Food insecurity:    Worry: Not on file    Inability: Not on file  . Transportation needs:    Medical: Not on file    Non-medical: Not on file  Tobacco Use  . Smoking status: Never Smoker  . Smokeless tobacco: Never Used  . Tobacco comment: never used tobacco  Substance and Sexual Activity  . Alcohol use: Never    Alcohol/week: 0.0 standard drinks    Frequency: Never  . Drug use: Never  . Sexual activity: Not on file  Lifestyle  . Physical activity:    Days per week: Not on file    Minutes per session: Not on file  . Stress: Not on file  Relationships  . Social connections:    Talks on phone: Not on file    Gets together: Not on file    Attends religious service: Not on file    Active member of club or organization: Not on file    Attends meetings of clubs or organizations: Not on file    Relationship status: Not on file  Other Topics Concern  . Not on file  Social  History Narrative  . Not on file    No family history on file.  Health Maintenance  Topic Date Due  . HEMOGLOBIN A1C  12-02-1945  . FOOT EXAM  03/10/1957  . OPHTHALMOLOGY EXAM  03/10/1957  . TETANUS/TDAP  03/10/1966  . MAMMOGRAM  03/10/1997  . COLONOSCOPY  03/10/1997  . DEXA SCAN  03/10/2012  . PNA vac Low Risk Adult (1 of 2 - PCV13) 03/10/2012  . INFLUENZA VACCINE  11/07/2018 (Originally 05/01/2018)  . Hepatitis C Screening  Completed    ----------------------------------------------------------------------------------------------------------------------------------------------------------------------------------------------------------------- Physical Exam BP 118/70   Pulse (!) 59   Temp 98.1 F (36.7 C) (Oral)   Ht 5\' 4"  (1.626 m)   Wt 205 lb (93 kg)    SpO2 94%   BMI 35.19 kg/m   Physical Exam  Constitutional: She is oriented to person, place, and time. She appears well-nourished. No distress.  HENT:  Head: Normocephalic and atraumatic.  Mouth/Throat: Oropharynx is clear and moist.  No sinus tenderness   Eyes: No scleral icterus.  Neck: Neck supple. No thyromegaly present.  Cardiovascular: Normal rate, regular rhythm and normal heart sounds.  Pulmonary/Chest: Effort normal and breath sounds normal.  Lymphadenopathy:    She has no cervical adenopathy.  Neurological: She is alert and oriented to person, place, and time. No cranial nerve deficit.  Skin: Skin is warm and dry.  Psychiatric: She has a normal mood and affect. Her behavior is normal.    ------------------------------------------------------------------------------------------------------------------------------------------------------------------------------------------------------------------- Assessment and Plan  Benign essential hypertension -BP is well controlled, continue current medications -Reminded to follow low salt diet.   Type 2 diabetes mellitus without complication, with long-term current use of insulin ([facility]) -Reports blood sugars controlled at home -Update A1c today -Will make adjustments to medications as needed -Reminded to follow healthy, low carb diet.   Gait instability -Fairly steady today, has difficulty in shower -Shower bench ordered.   CLL (chronic lymphocytic leukemia) Stable, follows with oncology.   Seasonal allergic rhinitis -Continue cetirizine -Use flonase consistently, may alternate with saline if needed

## 2018-07-15 NOTE — Assessment & Plan Note (Signed)
-  BP is well controlled, continue current medications -Reminded to follow low salt diet.

## 2018-07-15 NOTE — Assessment & Plan Note (Signed)
-  Fairly steady today, has difficulty in shower -Shower bench ordered.

## 2018-07-15 NOTE — Patient Instructions (Signed)
-  It was nice to meet you today -Try using flonase nasal spray consistently, you can alternate this with nasal saline as well.  -Let me know if symptoms are not improving. -We'll be in touch with you with lab results.

## 2018-07-15 NOTE — Assessment & Plan Note (Signed)
Stable, follows with oncology.

## 2018-07-15 NOTE — Assessment & Plan Note (Signed)
-  Reports blood sugars controlled at home -Update A1c today -Will make adjustments to medications as needed -Reminded to follow healthy, low carb diet.

## 2018-07-15 NOTE — Assessment & Plan Note (Signed)
-  Continue cetirizine -Use flonase consistently, may alternate with saline if needed

## 2018-07-16 LAB — HEMOGLOBIN A1C
EAG (MMOL/L): 13 (calc)
Hgb A1c MFr Bld: 9.8 % of total Hgb — ABNORMAL HIGH (ref <5.7)
MEAN PLASMA GLUCOSE: 235 (calc)

## 2018-07-17 DIAGNOSIS — F4325 Adjustment disorder with mixed disturbance of emotions and conduct: Secondary | ICD-10-CM | POA: Diagnosis not present

## 2018-07-17 DIAGNOSIS — F411 Generalized anxiety disorder: Secondary | ICD-10-CM | POA: Diagnosis not present

## 2018-07-23 ENCOUNTER — Telehealth: Payer: Self-pay

## 2018-07-23 ENCOUNTER — Telehealth: Payer: Self-pay | Admitting: Family Medicine

## 2018-07-23 MED ORDER — INSULIN PEN NEEDLE 32G X 4 MM MISC: 11 refills | Status: DC

## 2018-07-23 NOTE — Telephone Encounter (Signed)
Copied from [place] [id number]. Topic: General - Other >> Jul 23, 2018  9:44 AM Leward Quan A wrote: Reason for CRM: Courtney Paris (friend/care taker) called to request lab results from 07/15/18

## 2018-07-23 NOTE — Telephone Encounter (Signed)
Routing to office. Needles are not on active med list.

## 2018-07-23 NOTE — Telephone Encounter (Signed)
Spoke with patient nothing further needed

## 2018-07-23 NOTE — Telephone Encounter (Signed)
Copied from [place] [phone] [id number]. Topic: [facility] Communication - Rx Refill/Question >> Jul 23, 2018  9:30 AM Leward Quan A wrote: Medication: BD Ultra-Fine Nano Pen Needles 24mm x 32G  Has the patient contacted their pharmacy? Yes.      Preferred Pharmacy (with phone number or street name): [facility] [place] [facility] [place] [phone] (Phone) [phone] (Fax)    Agent: Please be advised that RX refills may take up to 3 business days. We ask that you follow-up with your pharmacy.

## 2018-07-23 NOTE — Telephone Encounter (Signed)
Please advise 

## 2018-07-23 NOTE — Telephone Encounter (Signed)
Result note sent

## 2018-07-23 NOTE — Telephone Encounter (Signed)
Refill has been sent to patient pharmacy.  

## 2018-07-25 ENCOUNTER — Ambulatory Visit: Payer: Self-pay

## 2018-07-25 NOTE — Telephone Encounter (Signed)
Noted  

## 2018-07-25 NOTE — Telephone Encounter (Signed)
Pt.'s caregiver, Tommie Raymond, reports pt. Has had night time fevers, every night around 8-9 o'clock. Has chills and sweats. Gives her IBU. Sometimes has a headache with this. Refuses an appointment today. Request Monday. Appointment made with Dr. Ethelene Hal. No availability with Dr. Zigmund Daniel. Instructed to keep pt. Well-hydrated. If fever worsens before appointment, instructed to go to ED. Verbalizes understanding.  Reason for Disposition . Fever present > 3 days (72 hours)  Answer Assessment - Initial Assessment Questions 1. TEMPERATURE: "What is the most recent temperature?"  "How was it measured?"      No fever this morning 2. ONSET: "When did the fever start?"      10 days ago - every evening around 8-9 at night 3. SYMPTOMS: "Do you have any other symptoms besides the fever?"  (e.g., colds, headache, sore throat, earache, cough, rash, diarrhea, vomiting, abdominal pain)     Headache 4. CAUSE: If there are no symptoms, ask: "What do you think is causing the fever?"      Unsure 5. CONTACTS: "Does anyone else in the family have an infection?"     No 6. TREATMENT: "What have you done so far to treat this fever?" (e.g., medications)     Takes IBU 7. IMMUNOCOMPROMISE: "Do you have of the following: diabetes, HIV positive, splenectomy, cancer chemotherapy, chronic steroid treatment, transplant patient, etc."     Leukemia 8. PREGNANCY: "Is there any chance you are pregnant?" "When was your last menstrual period?"     No 9. TRAVEL: "Have you traveled out of the country in the last month?" (e.g., travel history, exposures)     No  Protocols used: FEVER-A-AH

## 2018-07-28 ENCOUNTER — Encounter: Payer: Self-pay | Admitting: Family Medicine

## 2018-07-28 ENCOUNTER — Ambulatory Visit (INDEPENDENT_AMBULATORY_CARE_PROVIDER_SITE_OTHER): Payer: Medicare Other | Admitting: Family Medicine

## 2018-07-28 VITALS — BP 110/70 | HR 67 | Temp 97.7°F | Ht 64.0 in | Wt 204.5 lb

## 2018-07-28 DIAGNOSIS — J0100 Acute maxillary sinusitis, unspecified: Secondary | ICD-10-CM | POA: Diagnosis not present

## 2018-07-28 DIAGNOSIS — H8309 Labyrinthitis, unspecified ear: Secondary | ICD-10-CM

## 2018-07-28 DIAGNOSIS — C9110 Chronic lymphocytic leukemia of B-cell type not having achieved remission: Secondary | ICD-10-CM

## 2018-07-28 LAB — COMPREHENSIVE METABOLIC PANEL
ALT: 11 U/L (ref 0–35)
AST: 17 U/L (ref 0–37)
Albumin: 3.6 g/dL (ref 3.5–5.2)
Alkaline Phosphatase: 59 U/L (ref 39–117)
BUN: 13 mg/dL (ref 6–23)
CALCIUM: 11 mg/dL — AB (ref 8.4–10.5)
CHLORIDE: 101 meq/L (ref 96–112)
CO2: 29 meq/L (ref 19–32)
CREATININE: 1.33 mg/dL — AB (ref 0.40–1.20)
GFR: 50.52 mL/min — ABNORMAL LOW (ref >60.00)
Glucose, Bld: 196 mg/dL — ABNORMAL HIGH (ref 70–99)
POTASSIUM: 4.3 meq/L (ref 3.5–5.1)
SODIUM: 137 meq/L (ref 135–145)
Total Bilirubin: 0.6 mg/dL (ref 0.2–1.2)
Total Protein: 6 g/dL (ref 6.0–8.3)

## 2018-07-28 LAB — CBC
HEMATOCRIT: 32.5 % — AB (ref 36.0–46.0)
HEMOGLOBIN: 10.6 g/dL — AB (ref 12.0–15.0)
MCHC: 32.7 g/dL (ref 30.0–36.0)
MCV: 74.7 fl — ABNORMAL LOW (ref 78.0–100.0)
Platelets: 264 10*3/uL (ref 150.0–400.0)
RBC: 4.35 Mil/uL (ref 3.87–5.11)
RDW: 15.9 % — AB (ref 11.5–15.5)
WBC: 5.4 10*3/uL (ref 4.0–10.5)

## 2018-07-28 MED ORDER — MECLIZINE HCL 25 MG PO TABS: 25.0000 mg | ORAL_TABLET | Freq: Three times a day (TID) | ORAL | 0 refills | Status: DC | PRN

## 2018-07-28 MED ORDER — AMOXICILLIN-POT CLAVULANATE 875-125 MG PO TABS: 1.0000 | ORAL_TABLET | Freq: Two times a day (BID) | ORAL | 0 refills | Status: DC

## 2018-07-28 NOTE — Patient Instructions (Signed)
Sinusitis, Adult Sinusitis is soreness and inflammation of your sinuses. Sinuses are hollow spaces in the bones around your face. Your sinuses are located:  Around your eyes.  In the middle of your forehead.  Behind your nose.  In your cheekbones.  Your sinuses and nasal passages are lined with a stringy fluid (mucus). Mucus normally drains out of your sinuses. When your nasal tissues become inflamed or swollen, the mucus can become trapped or blocked so air cannot flow through your sinuses. This allows bacteria, viruses, and funguses to grow, which leads to infection. Sinusitis can develop quickly and last for 7?10 days (acute) or for more than 12 weeks (chronic). Sinusitis often develops after a cold. What are the causes? This condition is caused by anything that creates swelling in the sinuses or stops mucus from draining, including:  Allergies.  Asthma.  Bacterial or viral infection.  Abnormally shaped bones between the nasal passages.  Nasal growths that contain mucus (nasal polyps).  Narrow sinus openings.  Pollutants, such as chemicals or irritants in the air.  A foreign object stuck in the nose.  A fungal infection. This is rare.  What increases the risk? The following factors may make you more likely to develop this condition:  Having allergies or asthma.  Having had a recent cold or respiratory tract infection.  Having structural deformities or blockages in your nose or sinuses.  Having a weak immune system.  Doing a lot of swimming or diving.  Overusing nasal sprays.  Smoking.  What are the signs or symptoms? The main symptoms of this condition are pain and a feeling of pressure around the affected sinuses. Other symptoms include:  Upper toothache.  Earache.  Headache.  Bad breath.  Decreased sense of smell and taste.  A cough that may get worse at night.  Fatigue.  Fever.  Thick drainage from your nose. The drainage is often green and  it may contain pus (purulent).  Stuffy nose or congestion.  Postnasal drip. This is when extra mucus collects in the throat or back of the nose.  Swelling and warmth over the affected sinuses.  Sore throat.  Sensitivity to light.  How is this diagnosed? This condition is diagnosed based on symptoms, a medical history, and a physical exam. To find out if your condition is acute or chronic, your health care provider may:  Look in your nose for signs of nasal polyps.  Tap over the affected sinus to check for signs of infection.  View the inside of your sinuses using an imaging device that has a light attached (endoscope).  If your health care provider suspects that you have chronic sinusitis, you may also:  Be tested for allergies.  Have a sample of mucus taken from your nose (nasal culture) and checked for bacteria.  Have a mucus sample examined to see if your sinusitis is related to an allergy.  If your sinusitis does not respond to treatment and it lasts longer than 8 weeks, you may have an MRI or CT scan to check your sinuses. These scans also help to determine how severe your infection is. In rare cases, a bone biopsy may be done to rule out more serious types of fungal sinus disease. How is this treated? Treatment for sinusitis depends on the cause and whether your condition is chronic or acute. If a virus is causing your sinusitis, your symptoms will go away on their own within 10 days. You may be given medicines to relieve your symptoms,   including:  Topical nasal decongestants. They shrink swollen nasal passages and let mucus drain from your sinuses.  Antihistamines. These drugs block inflammation that is triggered by allergies. This can help to ease swelling in your nose and sinuses.  Topical nasal corticosteroids. These are nasal sprays that ease inflammation and swelling in your nose and sinuses.  Nasal saline washes. These rinses can help to get rid of thick mucus in  your nose.  If your condition is caused by bacteria, you will be given an antibiotic medicine. If your condition is caused by a fungus, you will be given an antifungal medicine. Surgery may be needed to correct underlying conditions, such as narrow nasal passages. Surgery may also be needed to remove polyps. Follow these instructions at home: Medicines  Take, use, or apply over-the-counter and prescription medicines only as told by your health care provider. These may include nasal sprays.  If you were prescribed an antibiotic medicine, take it as told by your health care provider. Do not stop taking the antibiotic even if you start to feel better. Hydrate and Humidify  Drink enough water to keep your urine clear or pale yellow. Staying hydrated will help to thin your mucus.  Use a cool mist humidifier to keep the humidity level in your home above 50%.  Inhale steam for 10-15 minutes, 3-4 times a day or as told by your health care provider. You can do this in the bathroom while a hot shower is running.  Limit your exposure to cool or dry air. Rest  Rest as much as possible.  Sleep with your head raised (elevated).  Make sure to get enough sleep each night. General instructions  Apply a warm, moist washcloth to your face 3-4 times a day or as told by your health care provider. This will help with discomfort.  Wash your hands often with soap and water to reduce your exposure to viruses and other germs. If soap and water are not available, use hand sanitizer.  Do not smoke. Avoid being around people who are smoking (secondhand smoke).  Keep all follow-up visits as told by your health care provider. This is important. Contact a health care provider if:  You have a fever.  Your symptoms get worse.  Your symptoms do not improve within 10 days. Get help right away if:  You have a severe headache.  You have persistent vomiting.  You have pain or swelling around your face or  eyes.  You have vision problems.  You develop confusion.  Your neck is stiff.  You have trouble breathing. This information is not intended to replace advice given to you by your health care provider. Make sure you discuss any questions you have with your health care provider. Document Released: 09/17/2005 Document Revised: 05/13/2016 Document Reviewed: 07/13/2015 Elsevier Interactive Patient Education  2018 [employer].  Sinusitis, Adult Sinusitis is soreness and inflammation of your sinuses. Sinuses are hollow spaces in the bones around your face. They are located:  Around your eyes.  In the middle of your forehead.  Behind your nose.  In your cheekbones.  Your sinuses and nasal passages are lined with a stringy fluid (mucus). Mucus normally drains out of your sinuses. When your nasal tissues get inflamed or swollen, the mucus can get trapped or blocked so air cannot flow through your sinuses. This lets bacteria, viruses, and funguses grow, and that leads to infection. Follow these instructions at home: Medicines  Take, use, or apply over-the-counter and prescription medicines  only as told by your doctor. These may include nasal sprays.  If you were prescribed an antibiotic medicine, take it as told by your doctor. Do not stop taking the antibiotic even if you start to feel better. Hydrate and Humidify  Drink enough water to keep your pee (urine) clear or pale yellow.  Use a cool mist humidifier to keep the humidity level in your home above 50%.  Breathe in steam for 10-15 minutes, 3-4 times a day or as told by your doctor. You can do this in the bathroom while a hot shower is running.  Try not to spend time in cool or dry air. Rest  Rest as much as possible.  Sleep with your head raised (elevated).  Make sure to get enough sleep each night. General instructions  Put a warm, moist washcloth on your face 3-4 times a day or as told by your doctor. This will help  with discomfort.  Wash your hands often with soap and water. If there is no soap and water, use hand sanitizer.  Do not smoke. Avoid being around people who are smoking (secondhand smoke).  Keep all follow-up visits as told by your doctor. This is important. Contact a doctor if:  You have a fever.  Your symptoms get worse.  Your symptoms do not get better within 10 days. Get help right away if:  You have a very bad headache.  You cannot stop throwing up (vomiting).  You have pain or swelling around your face or eyes.  You have trouble seeing.  You feel confused.  Your neck is stiff.  You have trouble breathing. This information is not intended to replace advice given to you by your health care provider. Make sure you discuss any questions you have with your health care provider. Document Released: 03/05/2008 Document Revised: 05/13/2016 Document Reviewed: 07/13/2015 Elsevier Interactive Patient Education  2018 [employer].  Labyrinthitis Labyrinthitis is an infection of the inner ear. Your inner ear is a system of tubes and canals (labyrinth). These are filled with fluid. Nerve cells in your inner ear send signals for hearing and balance to your brain. When tiny germs get inside the tubes and canals, they harm the cells that send messages to the brain. This can cause changes in hearing and balance. Follow these instructions at home:  Take medicines only as told by your doctor.  If you were prescribed an antibiotic medicine, finish all of it even if you start to feel better.  Rest as much as possible.  Avoid loud noises and bright lights.  Do not make sudden movements until any dizziness goes away.  Do not drive until your doctor says that you can.  Drink enough fluid to keep your pee (urine) clear or pale yellow.  Work with a physical therapist if you still feel dizzy after several weeks. A therapist can teach you exercises to help you deal with your  dizziness.  Keep all follow-up visits as told by your doctor. This is important. Contact a doctor if:  Your symptoms do not get better with medicines.  You do not get better after two weeks.  You have a fever. Get help right away if:  You are very dizzy.  You keep throwing up (vomiting) or keep feeling sick to your stomach (nauseous).  Your hearing gets a lot worse very quickly. This information is not intended to replace advice given to you by your health care provider. Make sure you discuss any questions you have  with your health care provider. Document Released: 09/17/2005 Document Revised: 02/23/2016 Document Reviewed: 06/29/2014 Elsevier Interactive Patient Education  [employer].

## 2018-07-28 NOTE — Progress Notes (Signed)
Subjective:  Patient ID: Megan Tate, female    DOB: 13-Jan-1946  Age: 72 y.o. MRN: [id number]  CC: Fever (only happening at night time, been ongoing for 10 days. )   HPI Megan Tate presents for evaluation of a 10-day history of nasal congestion with some facial pressure.  Over the last several days she has had nighttime fevers accompanied by soaking sweats where her bed clothing is had to be changed.  She is currently having copious yellow rhinorrhea with some gum discomfort.  She is also experienced this sensation of spinning.  Her ears have been somewhat congested as well.  She denies cough.  She denies nausea and vomiting or abdominal pain.  There is been no diarrhea.  She has had no issues and issues with urine flow.  Denies dysuria frequency or urgency.  She has of an appointment with her hematologist on Thursday.  She is accompanied today by a gentleman identified is her caregiver.  Outpatient Medications Prior to Visit  Medication Sig Dispense Refill  . Blood Glucose Monitoring Suppl (ONE TOUCH ULTRA SYSTEM KIT) W/DEVICE KIT 1 kit by Does not apply route once. Check blood sugar daily before meals and at bedtime. 1 each 0  . cetirizine (ZYRTEC) 5 MG tablet Take 1 tablet (5 mg total) by mouth daily. 30 tablet 0  . cloNIDine (CATAPRES) 0.2 MG tablet Take 0.2 mg by mouth 2 (two) times daily.     . dorzolamide-timolol (COSOPT) 22.3-6.8 MG/ML ophthalmic solution Place 1 drop into both eyes 2 (two) times daily.     Marland Kitchen doxepin (SINEQUAN) 10 MG capsule Take 1 capsule by mouth daily.    . fluticasone (FLONASE) 50 MCG/ACT nasal spray Place 2 sprays into both nostrils daily. 16 g 0  . folic acid (FOLVITE) 1 MG tablet Take 2 tablets (2 mg total) by mouth daily. 60 tablet 12  . glucose blood (ONE TOUCH ULTRA TEST) test strip USE TO TEST BLOOD SUGAR BEFORE MEALS AND AT BEDTIME 360 each 0  . hydrocortisone (ANUSOL-HC) 2.5 % rectal cream Apply rectally 2 times daily 28.35 g 1  . Insulin Detemir  (LEVEMIR FLEXTOUCH) 100 UNIT/ML Pen Inject 10 Units into the skin every morning. 15 mL 3  . Insulin Pen Needle (BD PEN NEEDLE NANO U/F) 32G X 4 MM MISC Patient is to check blood sugar daily before meals and bedtime. Dx. E11.9 200 each 11  . Iron-FA-B Cmp-C-Biot-Probiotic (FUSION PLUS) CAPS Take 1 capsule by mouth every morning. 90 capsule 2  . JANUMET XR 50-1000 MG TB24 Take 2 tablets by mouth daily.  5  . lidocaine-prilocaine (EMLA) cream Apply to Port-A-Cath site 1 hour ride to treatment 30 g 4  . LINZESS 145 MCG CAPS capsule Take 1 capsule by mouth daily.    Marland Kitchen losartan (COZAAR) 100 MG tablet Take 1 tablet (100 mg total) by mouth daily. 90 tablet 2  . metoprolol (TOPROL-XL) 200 MG 24 hr tablet Take 1 tablet (200 mg total) by mouth daily. 90 tablet 2  . rosuvastatin (CRESTOR) 20 MG tablet Take 20 mg by mouth daily.    Marland Kitchen spironolactone (ALDACTONE) 50 MG tablet Take 1 tablet (50 mg total) by mouth daily. 30 tablet 0  . tiZANidine (ZANAFLEX) 2 MG tablet Take 1 tablet (2 mg total) by mouth every 8 (eight) hours as needed for muscle spasms. 270 tablet 1  . UNABLE TO FIND One Touch Ultra Blood Glucose Monitoring Meter. Dx E11.9 1 kit 1   No facility-administered medications prior  to visit.     ROS Review of Systems  Constitutional: Positive for chills, diaphoresis, fatigue and fever.  HENT: Positive for congestion, dental problem, hearing loss, rhinorrhea, sinus pressure and sinus pain. Negative for ear pain, postnasal drip and sore throat.   Eyes: Negative for photophobia and visual disturbance.  Respiratory: Negative for cough, shortness of breath and wheezing.   Cardiovascular: Negative.   Gastrointestinal: Negative for abdominal pain, anal bleeding, blood in stool and diarrhea.  Endocrine: Negative for polyphagia and polyuria.  Genitourinary: Negative for dysuria, frequency and urgency.  Skin: Negative for pallor.  Allergic/Immunologic: Positive for immunocompromised state.  Neurological:  Positive for dizziness. Negative for numbness and headaches.  Hematological: Does not bruise/bleed easily.  Psychiatric/Behavioral: Negative.     Objective:  BP 110/70   Pulse 67   Temp 97.7 F (36.5 C) (Oral)   Ht _0  (1.626 m)   Wt 204 lb 8 oz (92.8 kg)   SpO2 97%   BMI 35.10 kg/m   BP Readings from Last 3 Encounters:  07/28/18 110/70  07/15/18 118/70  06/19/18 (!) 181/75    Wt Readings from Last 3 Encounters:  07/28/18 204 lb 8 oz (92.8 kg)  07/15/18 205 lb (93 kg)  06/03/18 200 lb (90.7 kg)    Physical Exam  Constitutional: She is oriented to person, place, and time. She appears well-developed and well-nourished. No distress.  HENT:  Head: Normocephalic and atraumatic.  Right Ear: External ear normal.  Left Ear: External ear normal.  Mouth/Throat: Oropharynx is clear and moist. No oropharyngeal exudate.  Eyes: Pupils are equal, round, and reactive to light. Conjunctivae and EOM are normal. Right eye exhibits no discharge. Left eye exhibits no discharge. No scleral icterus.  Neck: Neck supple. No JVD present. No tracheal deviation present. No thyromegaly present.  Cardiovascular: Normal rate, regular rhythm and normal heart sounds.  Pulmonary/Chest: Effort normal and breath sounds normal.  Abdominal: Bowel sounds are normal.  Lymphadenopathy:    She has no cervical adenopathy.  Neurological: She is alert and oriented to person, place, and time.  Skin: Skin is warm and dry. She is not diaphoretic.  Psychiatric: She has a normal mood and affect. Her behavior is normal.    Lab Results  Component Value Date   WBC 3.9 05/08/2018   HGB 9.8 (L) 05/08/2018   HCT 30.1 (L) 05/08/2018   PLT 216 05/08/2018   GLUCOSE 246 (H) 05/08/2018   ALT 14 05/08/2018   AST 19 05/08/2018   NA 132 05/08/2018   K 4.5 05/08/2018   CL 104 05/08/2018   CREATININE 1.40 (H) 05/08/2018   BUN 12 05/08/2018   CO2 31 05/08/2018   INR 1.10 03/17/2015   HGBA1C 9.8 (H) 07/15/2018    MICROALBUR 2.7 (H) 07/15/2018    Dg Chest 2 View  Result Date: 06/03/2018 CLINICAL DATA:  Cough and sinus drainage. History of chronic lymphocytic leukemia EXAM: CHEST - 2 VIEW COMPARISON:  Jan 29, 2018 FINDINGS: Port-A-Cath tip is in the superior vena cava near the cavoatrial junction. No pneumothorax. No edema or consolidation. Heart is upper normal in size with pulmonary vascularity normal. No adenopathy. No evident bone lesions. IMPRESSION: Port-A-Cath tip in superior vena cava. No pneumothorax. No edema or consolidation. Stable cardiac silhouette. Electronically Signed   By: Lowella Grip III M.D.   On: 06/03/2018 11:42   Dg Shoulder Left  Result Date: 06/03/2018 CLINICAL DATA:  Pain following recent fall. History of chronic lymphocytic leukemia EXAM: LEFT SHOULDER -  2+ VIEW COMPARISON:  None. FINDINGS: Oblique, Y scapular, and axillary images were obtained. No fracture or dislocation. Joint spaces appear normal. No erosive change. No blastic or lytic lesions. Visualized left lung clear. IMPRESSION: No fracture or dislocation. No blastic or lytic bone lesion. No evident arthropathy. Electronically Signed   By: Lowella Grip III M.D.   On: 06/03/2018 11:42    Assessment & Plan:   Megan Tate was seen today for fever.  Diagnoses and all orders for this visit:  CLL (chronic lymphocytic leukemia) ([place]) -     CBC -     Comprehensive metabolic panel  Acute maxillary sinusitis, recurrence not specified -     amoxicillin-clavulanate (AUGMENTIN) 875-125 MG tablet; Take 1 tablet by mouth 2 (two) times daily. -     CBC  Labyrinthitis, unspecified laterality -     meclizine (ANTIVERT) 25 MG tablet; Take 1 tablet (25 mg total) by mouth 3 (three) times daily as needed for dizziness.   I am having Megan Tate start on amoxicillin-clavulanate and meclizine. I am also having her maintain her cloNIDine, dorzolamide-timolol, ONE TOUCH ULTRA SYSTEM KIT, rosuvastatin, JANUMET XR, lidocaine-prilocaine,  doxepin, LINZESS, folic acid, glucose blood, UNABLE TO FIND, tiZANidine, FUSION PLUS, hydrocortisone, spironolactone, fluticasone, cetirizine, Insulin Detemir, metoprolol, losartan, and Insulin Pen Needle.  Meds ordered this encounter  Medications  . amoxicillin-clavulanate (AUGMENTIN) 875-125 MG tablet    Sig: Take 1 tablet by mouth 2 (two) times daily.    Dispense:  20 tablet    Refill:  0  . meclizine (ANTIVERT) 25 MG tablet    Sig: Take 1 tablet (25 mg total) by mouth 3 (three) times daily as needed for dizziness.    Dispense:  30 tablet    Refill:  0   We will go ahead and treat her for sinusitis and labyrinthitis.  Anticipatory guidance was given on sinusitis and labyrinthitis.  She will also see hematology this coming Thursday and follow-up with [place] next week if not improving. Follow-up: Return in about 1 week (around 08/04/2018), or if symptoms worsen or fail to improve, for also fu with the cancer doctor on Thursday.Libby Maw, MD

## 2018-07-30 ENCOUNTER — Telehealth: Payer: Self-pay | Admitting: Emergency Medicine

## 2018-07-30 NOTE — Telephone Encounter (Signed)
Patient would like results from Urine micoalbumin.Patient is aware that pcp is not in the office until tomorrow.  Please Advise Thank you .

## 2018-07-31 ENCOUNTER — Inpatient Hospital Stay: Payer: Medicare Other | Attending: Hematology & Oncology | Admitting: Hematology & Oncology

## 2018-07-31 ENCOUNTER — Inpatient Hospital Stay: Payer: Medicare Other

## 2018-07-31 VITALS — BP 134/79 | HR 61 | Temp 98.8°F | Resp 18 | Wt 202.5 lb

## 2018-07-31 DIAGNOSIS — Z9221 Personal history of antineoplastic chemotherapy: Secondary | ICD-10-CM | POA: Diagnosis not present

## 2018-07-31 DIAGNOSIS — J329 Chronic sinusitis, unspecified: Secondary | ICD-10-CM | POA: Diagnosis not present

## 2018-07-31 DIAGNOSIS — Q928 Other specified trisomies and partial trisomies of autosomes: Secondary | ICD-10-CM

## 2018-07-31 DIAGNOSIS — C9110 Chronic lymphocytic leukemia of B-cell type not having achieved remission: Secondary | ICD-10-CM

## 2018-07-31 DIAGNOSIS — R197 Diarrhea, unspecified: Secondary | ICD-10-CM | POA: Diagnosis not present

## 2018-07-31 DIAGNOSIS — Z1231 Encounter for screening mammogram for malignant neoplasm of breast: Secondary | ICD-10-CM

## 2018-07-31 DIAGNOSIS — Z79899 Other long term (current) drug therapy: Secondary | ICD-10-CM

## 2018-07-31 DIAGNOSIS — Z794 Long term (current) use of insulin: Secondary | ICD-10-CM | POA: Insufficient documentation

## 2018-07-31 DIAGNOSIS — D500 Iron deficiency anemia secondary to blood loss (chronic): Secondary | ICD-10-CM

## 2018-07-31 DIAGNOSIS — E1165 Type 2 diabetes mellitus with hyperglycemia: Secondary | ICD-10-CM | POA: Diagnosis not present

## 2018-07-31 LAB — IRON AND TIBC
Iron: 88 ug/dL (ref 41–142)
SATURATION RATIOS: 35 % (ref 21–57)
TIBC: 253 ug/dL (ref 236–444)
UIBC: 165 ug/dL (ref 120–384)

## 2018-07-31 LAB — FERRITIN: Ferritin: 442 ng/mL — ABNORMAL HIGH (ref 11–307)

## 2018-07-31 LAB — LACTATE DEHYDROGENASE: LDH: 154 U/L (ref 98–192)

## 2018-07-31 NOTE — Telephone Encounter (Signed)
Left a VM for patient to give the office a call back regarding urine results.

## 2018-07-31 NOTE — Telephone Encounter (Signed)
Urine microalbumin mildly elevated.  Already on losartan, recommend continuation of this.

## 2018-07-31 NOTE — Progress Notes (Signed)
Hematology and Oncology Follow Up Visit  Megan Tate [id number] 1946/01/02 72 y.o. 07/31/2018   Principle Diagnosis:  Chronic lymphocytic leukemia- Trisomy 12  Past Therapy:             Status post 6 cycles of Gazyva/Bendamustine - completed4/2016 Maintenance Gazyva every 3 months s/p cycle 11 -completed 2 years in June 2018  Current Therapy:   Observation   Interim History:  [name] is here today with her husband for follow-up.  It has been 3 months since we last saw her.  She and her husband had a good time down in [facility]  They were down there and I think in August or September.  It was quite hot.  They had a good time.  As always, her blood sugars are not that good.  She just does not manage these all that well.  I think that she does have a new primary care doctor who is trying to help.  She has never had a mammogram.  I convinced her to get a mammogram done.  I put the order in.  She has had sinusitis issues.  She is now taking amoxicillin.  Hopefully, this will help with her sinuses.  She is having a bit of diarrhea.  I think this probably is from the amoxicillin.  I told her to take some over-the-counter Align which is a probiotic.    Currently, her performance status is ECOG 1.    Medications:  Allergies as of 07/31/2018      Reactions   Tylenol [acetaminophen] Other (See Comments)   Messes with stomach      Medication List        Accurate as of 07/31/18  9:10 AM. Always use your most recent med list.          amoxicillin-clavulanate 875-125 MG tablet Commonly known as:  AUGMENTIN Take 1 tablet by mouth 2 (two) times daily.   cetirizine 5 MG tablet Commonly known as:  ZYRTEC Take 1 tablet (5 mg total) by mouth daily.   cloNIDine 0.2 MG tablet Commonly known as:  CATAPRES Take 0.2 mg by mouth 2 (two) times daily.   dorzolamide-timolol 22.3-6.8 MG/ML ophthalmic solution Commonly known as:  COSOPT Place 1 drop into both eyes 2 (two) times  daily.   doxepin 10 MG capsule Commonly known as:  SINEQUAN Take 1 capsule by mouth daily.   fluticasone 50 MCG/ACT nasal spray Commonly known as:  FLONASE Place 2 sprays into both nostrils daily.   folic acid 1 MG tablet Commonly known as:  FOLVITE Take 2 tablets (2 mg total) by mouth daily.   FUSION PLUS Caps Take 1 capsule by mouth every morning.   glucose blood test strip USE TO TEST BLOOD SUGAR BEFORE MEALS AND AT BEDTIME   hydrocortisone 2.5 % rectal cream Commonly known as:  ANUSOL-HC Apply rectally 2 times daily   Insulin Detemir 100 UNIT/ML Pen Commonly known as:  LEVEMIR Inject 10 Units into the skin every morning.   Insulin Pen Needle 32G X 4 MM Misc Patient is to check blood sugar daily before meals and bedtime. Dx. E11.9   JANUMET XR 50-1000 MG Tb24 Generic drug:  SitaGLIPtin-MetFORMIN HCl Take 2 tablets by mouth daily.   lidocaine-prilocaine cream Commonly known as:  EMLA Apply to Port-A-Cath site 1 hour ride to treatment   LINZESS 145 MCG Caps capsule Generic drug:  linaclotide Take 1 capsule by mouth daily.   losartan 100 MG tablet Commonly known as:  COZAAR Take 1 tablet (100 mg total) by mouth daily.   meclizine 25 MG tablet Commonly known as:  ANTIVERT Take 1 tablet (25 mg total) by mouth 3 (three) times daily as needed for dizziness.   metoprolol 200 MG 24 hr tablet Commonly known as:  TOPROL-XL Take 1 tablet (200 mg total) by mouth daily.   ONE TOUCH ULTRA SYSTEM KIT w/Device Kit 1 kit by Does not apply route once. Check blood sugar daily before meals and at bedtime.   rosuvastatin 20 MG tablet Commonly known as:  CRESTOR Take 20 mg by mouth daily.   spironolactone 50 MG tablet Commonly known as:  ALDACTONE Take 1 tablet (50 mg total) by mouth daily.   tiZANidine 2 MG tablet Commonly known as:  ZANAFLEX Take 1 tablet (2 mg total) by mouth every 8 (eight) hours as needed for muscle spasms.   UNABLE TO FIND One Touch Ultra  Blood Glucose Monitoring Meter. Dx E11.9       Allergies:  Allergies  Allergen Reactions  . Tylenol [Acetaminophen] Other (See Comments)    Messes with stomach    Past Medical History, Surgical history, Social history, and Family History were reviewed and updated.  Review of Systems: Review of Systems  Constitutional: Negative.   HENT: Negative.   Eyes: Negative.   Respiratory: Negative.   Cardiovascular: Negative.   Gastrointestinal: Negative.   Genitourinary: Negative.   Musculoskeletal: Negative.   Skin: Negative.   Neurological: Negative.   Endo/Heme/Allergies: Negative.   Psychiatric/Behavioral: Negative.      Physical Exam:  weight is 202 lb 8 oz (91.9 kg). Her oral temperature is 98.8 F (37.1 C). Her blood pressure is 134/79 and her pulse is 61. Her respiration is 18.   Wt Readings from Last 3 Encounters:  07/31/18 202 lb 8 oz (91.9 kg)  07/28/18 204 lb 8 oz (92.8 kg)  07/15/18 205 lb (93 kg)    Physical Exam  Constitutional: She is oriented to person, place, and time.  HENT:  Head: Normocephalic and atraumatic.  Mouth/Throat: Oropharynx is clear and moist.  Eyes: Pupils are equal, round, and reactive to light. EOM are normal.  Neck: Normal range of motion.  Cardiovascular: Normal rate, regular rhythm and normal heart sounds.  Pulmonary/Chest: Effort normal and breath sounds normal.  Abdominal: Soft. Bowel sounds are normal.  Musculoskeletal: Normal range of motion. She exhibits no edema, tenderness or deformity.  Lymphadenopathy:    She has no cervical adenopathy.  Neurological: She is alert and oriented to person, place, and time.  Skin: Skin is warm and dry. No rash noted. No erythema.  Psychiatric: She has a normal mood and affect. Her behavior is normal. Judgment and thought content normal.  Vitals reviewed.    Lab Results  Component Value Date   WBC 5.4 07/28/2018   HGB 10.6 (L) 07/28/2018   HCT 32.5 (L) 07/28/2018   MCV 74.7 (L)  07/28/2018   PLT 264.0 07/28/2018   Lab Results  Component Value Date   FERRITIN 337 (H) 05/08/2018   IRON 52 05/08/2018   TIBC 269 05/08/2018   UIBC 217 05/08/2018   IRONPCTSAT 19 (L) 05/08/2018   Lab Results  Component Value Date   RETICCTPCT 1.1 05/08/2018   RBC 4.35 07/28/2018   RETICCTABS 65.4 04/07/2014   Lab Results  Component Value Date   KAPLAMBRATIO 1.59 03/28/2017   Lab Results  Component Value Date   IGGSERUM 539 (L) 03/28/2017   IGA 41 (L) 03/30/2015  IGMSERUM <5 (L) 03/28/2017   Lab Results  Component Value Date   TOTALPROTELP 5.5 (L) 03/30/2015   ALBUMINELP 3.4 (L) 03/30/2015   A1GS 0.3 03/30/2015   A2GS 0.8 03/30/2015   BETS 0.4 03/30/2015   BETA2SER 0.2 03/30/2015   GAMS 0.5 (L) 03/30/2015   MSPIKE Not Observed 03/28/2017   SPEI * 03/30/2015     Chemistry      Component Value Date/Time   NA 137 07/28/2018 0857   NA 142 09/05/2017 0751   K 4.3 07/28/2018 0857   K 3.8 09/05/2017 0751   CL 101 07/28/2018 0857   CL 104 09/05/2017 0751   CO2 29 07/28/2018 0857   CO2 27 09/05/2017 0751   BUN 13 07/28/2018 0857   BUN 11 09/05/2017 0751   CREATININE 1.33 (H) 07/28/2018 0857   CREATININE 1.40 (H) 05/08/2018 1055   CREATININE 1.1 09/05/2017 0751      Component Value Date/Time   CALCIUM 11.0 (H) 07/28/2018 0857   CALCIUM 10.8 (H) 09/05/2017 0751   ALKPHOS 59 07/28/2018 0857   ALKPHOS 88 (H) 09/05/2017 0751   AST 17 07/28/2018 0857   AST 19 05/08/2018 1055   ALT 11 07/28/2018 0857   ALT 14 05/08/2018 1055   ALT 14 09/05/2017 0751   BILITOT 0.6 07/28/2018 0857   BILITOT 0.7 05/08/2018 1055      Impression and Plan: Ms. Rathbun is a pleasant 72 yo African American female with chronic lymphocytic leukemia. She completed treatment with Gazyva/Bendamustine in April 2016 and maintenance Gazyva in June 2018.   We will see what her iron studies show. She is on an oral supplement. If low we will change to IV iron.   As always, we do have to worry  about the CLL coming back.  I cannot find anything on exam that would suggest this.  Hopefully, she will get the mammogram done.  We will get her back now in 4 months.  I think 4 months would be very reasonable.  Volanda Napoleon, MD 10/31/20199:10 AM

## 2018-08-04 NOTE — Telephone Encounter (Signed)
Spoke with patient regarding results. Patient understood and has no further questions or concerns

## 2018-08-05 ENCOUNTER — Telehealth: Payer: Self-pay | Admitting: Family Medicine

## 2018-08-05 ENCOUNTER — Other Ambulatory Visit: Payer: Self-pay | Admitting: Family Medicine

## 2018-08-05 ENCOUNTER — Encounter (HOSPITAL_BASED_OUTPATIENT_CLINIC_OR_DEPARTMENT_OTHER): Payer: Self-pay

## 2018-08-05 ENCOUNTER — Ambulatory Visit (HOSPITAL_BASED_OUTPATIENT_CLINIC_OR_DEPARTMENT_OTHER)
Admission: RE | Admit: 2018-08-05 | Discharge: 2018-08-05 | Disposition: A | Discharge status: Home / Self Care | Payer: Medicare Other | Source: Ambulatory Visit | Attending: Hematology & Oncology | Admitting: Hematology & Oncology

## 2018-08-05 ENCOUNTER — Other Ambulatory Visit: Payer: Self-pay | Admitting: Family

## 2018-08-05 ENCOUNTER — Other Ambulatory Visit: Payer: Self-pay | Admitting: Hematology & Oncology

## 2018-08-05 DIAGNOSIS — D500 Iron deficiency anemia secondary to blood loss (chronic): Secondary | ICD-10-CM

## 2018-08-05 DIAGNOSIS — Z1231 Encounter for screening mammogram for malignant neoplasm of breast: Secondary | ICD-10-CM | POA: Diagnosis not present

## 2018-08-05 DIAGNOSIS — C9110 Chronic lymphocytic leukemia of B-cell type not having achieved remission: Secondary | ICD-10-CM

## 2018-08-05 DIAGNOSIS — M25551 Pain in right hip: Secondary | ICD-10-CM

## 2018-08-05 NOTE — Telephone Encounter (Signed)
Prescription has been sen to the pharmacy.

## 2018-08-05 NOTE — Telephone Encounter (Signed)
Catapres 0.2 mg tablet refill request however this medication was prescribed by a historical provider back on 02/04/14.   PCP:  Dr. Zigmund Daniel.  [id number] [employer] [facility].  See attached

## 2018-08-05 NOTE — Telephone Encounter (Signed)
Copied from [place] [phone]. Topic: [facility] Communication - Rx Refill/Question >> Aug 05, 2018  8:30 AM Chauncey Mann A wrote: Medication: cloNIDine (CATAPRES) 0.2 MG tablet  Has the patient contacted their pharmacy? Yes.   (Agent: If no, request that the patient contact the pharmacy for the refill.) (Agent: If yes, when and what did the pharmacy advise?)  Preferred Pharmacy (with phone number or street name): [facility] [place], [place] [phone] (Phone) [phone] (Fax)    Agent: Please be advised that RX refills may take up to 3 business days. We ask that you follow-up with your pharmacy.

## 2018-08-07 ENCOUNTER — Other Ambulatory Visit: Payer: Self-pay | Admitting: *Deleted

## 2018-08-07 DIAGNOSIS — M25551 Pain in right hip: Secondary | ICD-10-CM

## 2018-08-07 DIAGNOSIS — Z1231 Encounter for screening mammogram for malignant neoplasm of breast: Secondary | ICD-10-CM

## 2018-08-07 DIAGNOSIS — C9110 Chronic lymphocytic leukemia of B-cell type not having achieved remission: Secondary | ICD-10-CM

## 2018-08-07 MED ORDER — SPIRONOLACTONE 50 MG PO TABS: 50.0000 mg | ORAL_TABLET | Freq: Every day | ORAL | 6 refills | Status: DC

## 2018-08-19 ENCOUNTER — Other Ambulatory Visit: Payer: Self-pay | Admitting: Hematology & Oncology

## 2018-08-19 ENCOUNTER — Other Ambulatory Visit: Payer: Self-pay | Admitting: *Deleted

## 2018-08-19 DIAGNOSIS — C9110 Chronic lymphocytic leukemia of B-cell type not having achieved remission: Secondary | ICD-10-CM

## 2018-08-19 DIAGNOSIS — D500 Iron deficiency anemia secondary to blood loss (chronic): Secondary | ICD-10-CM

## 2018-08-19 MED ORDER — FUSION PLUS PO CAPS: ORAL_CAPSULE | ORAL | 3 refills | Status: DC

## 2018-08-25 ENCOUNTER — Telehealth: Payer: Self-pay | Admitting: Family Medicine

## 2018-08-25 ENCOUNTER — Ambulatory Visit: Payer: Self-pay

## 2018-08-25 NOTE — Telephone Encounter (Signed)
Call placed to pt. to discuss her c/o nausea on Janumet and Zyrtec.  Stated she takes the above medications together, without any other medications, and gets an upset stomach.  Reported she has had nausea for about 3 mos.  C/o intermittent vomiting about twice / week.  Denied abdominal pain or abdominal bloating.  Stated her bowels move everyday; has  intermittent diarrhea stools. Denied any blood in her emesis or stools.  C/o pounding headache.  Denied fever.  Denied dizziness.  Due to ongoing nausea, appt. offered in the morning with PCP.  Pt. declined an appt. this week; stating she is leaving town tomorrow and will return on Saturday.  Appt. Scheduled for 09/01/18 @ 9:30 AM with PCP.  Encouraged to stay hydrated; reviewed signs of dehydration with pt.  Also advised if symptoms worsen, while she is out of town, she should seek care at the ER.  Verb. Understanding.       Reason for Disposition . Nausea lasts > 1 week  Answer Assessment - Initial Assessment Questions 1. NAUSEA SEVERITY: "How bad is the nausea?" (e.g., mild, moderate, severe; dehydration, weight loss)   - MILD: loss of appetite without change in eating habits   - MODERATE: decreased oral intake without significant weight loss, dehydration, or malnutrition   - SEVERE: inadequate caloric or fluid intake, significant weight loss, symptoms of dehydration     Moderate  2. ONSET: "When did the nausea begin?"     About 3 mos ago  3. VOMITING: "Any vomiting?" If so, ask: "How many times today?"     Intermittent vomiting 4. RECURRENT SYMPTOM: "Have you had nausea before?" If so, ask: "When was the last time?" "What happened that time?"     Nausea over past 3 mos.  5. CAUSE: "What do you think is causing the nausea?"     Janumet and Zyrtec 6. PREGNANCY: "Is there any chance you are pregnant?" (e.g., unprotected intercourse, missed birth control pill, broken condom)    N/a 7. Other symptoms;  C/o pounding headache, pain in cheeks; vomiting  2 times / week, continues to have upset stomach; intermittent diarrhea; has BM daily; denied blood in stool or emesis.  Protocols used: NAUSEA-A-AH  Copied from [place] [id number]. Topic: [facility] Communication - Rx Refill/Question >> Aug 25, 2018 10:18 AM Virl Axe D wrote: Medication: JANUMET XR 50-1000 MG Tb24 / cetirizine (ZYRTEC) 5 MG tablet / Pt would like to have these two medications changed to different ones. She states that after taking them she feels sick on her stomach. Please advise

## 2018-08-25 NOTE — Telephone Encounter (Signed)
Copied from [place] [phone]. Topic: [facility] Communication - Rx Refill/Question >> Aug 25, 2018 10:18 AM Virl Axe D wrote: Medication: JANUMET XR 50-1000 MG Tb24 / cetirizine (ZYRTEC) 5 MG tablet / Pt would like to have these two medications changed to different ones. She states that after taking them she feels sick on her stomach. Please advise  Has the patient contacted their pharmacy? No. (Agent: If no, request that the patient contact the pharmacy for the refill.) (Agent: If yes, when and what did the pharmacy advise?)  Preferred Pharmacy (with phone number or street name): [facility] [place], [place] [phone] (Phone) [phone] (Fax)    Agent: Please be advised that RX refills may take up to 3 business days. We ask that you follow-up with your pharmacy.

## 2018-08-25 NOTE — Telephone Encounter (Signed)
FYI

## 2018-08-25 NOTE — Telephone Encounter (Signed)
Call placed to pt. To discuss symptoms.  See Triage note.

## 2018-08-31 IMAGING — DX DG CHEST 2V
2 series · 2 of 2 positions shown · non-contrast
Comparison: CT chest of 11/24/2014 and chest x-ray of 11/16/2014

CLINICAL DATA: Fever for 5 days, nasal congestion, history of
leukemia

EXAM:
CHEST  2 VIEW

[chest pa]
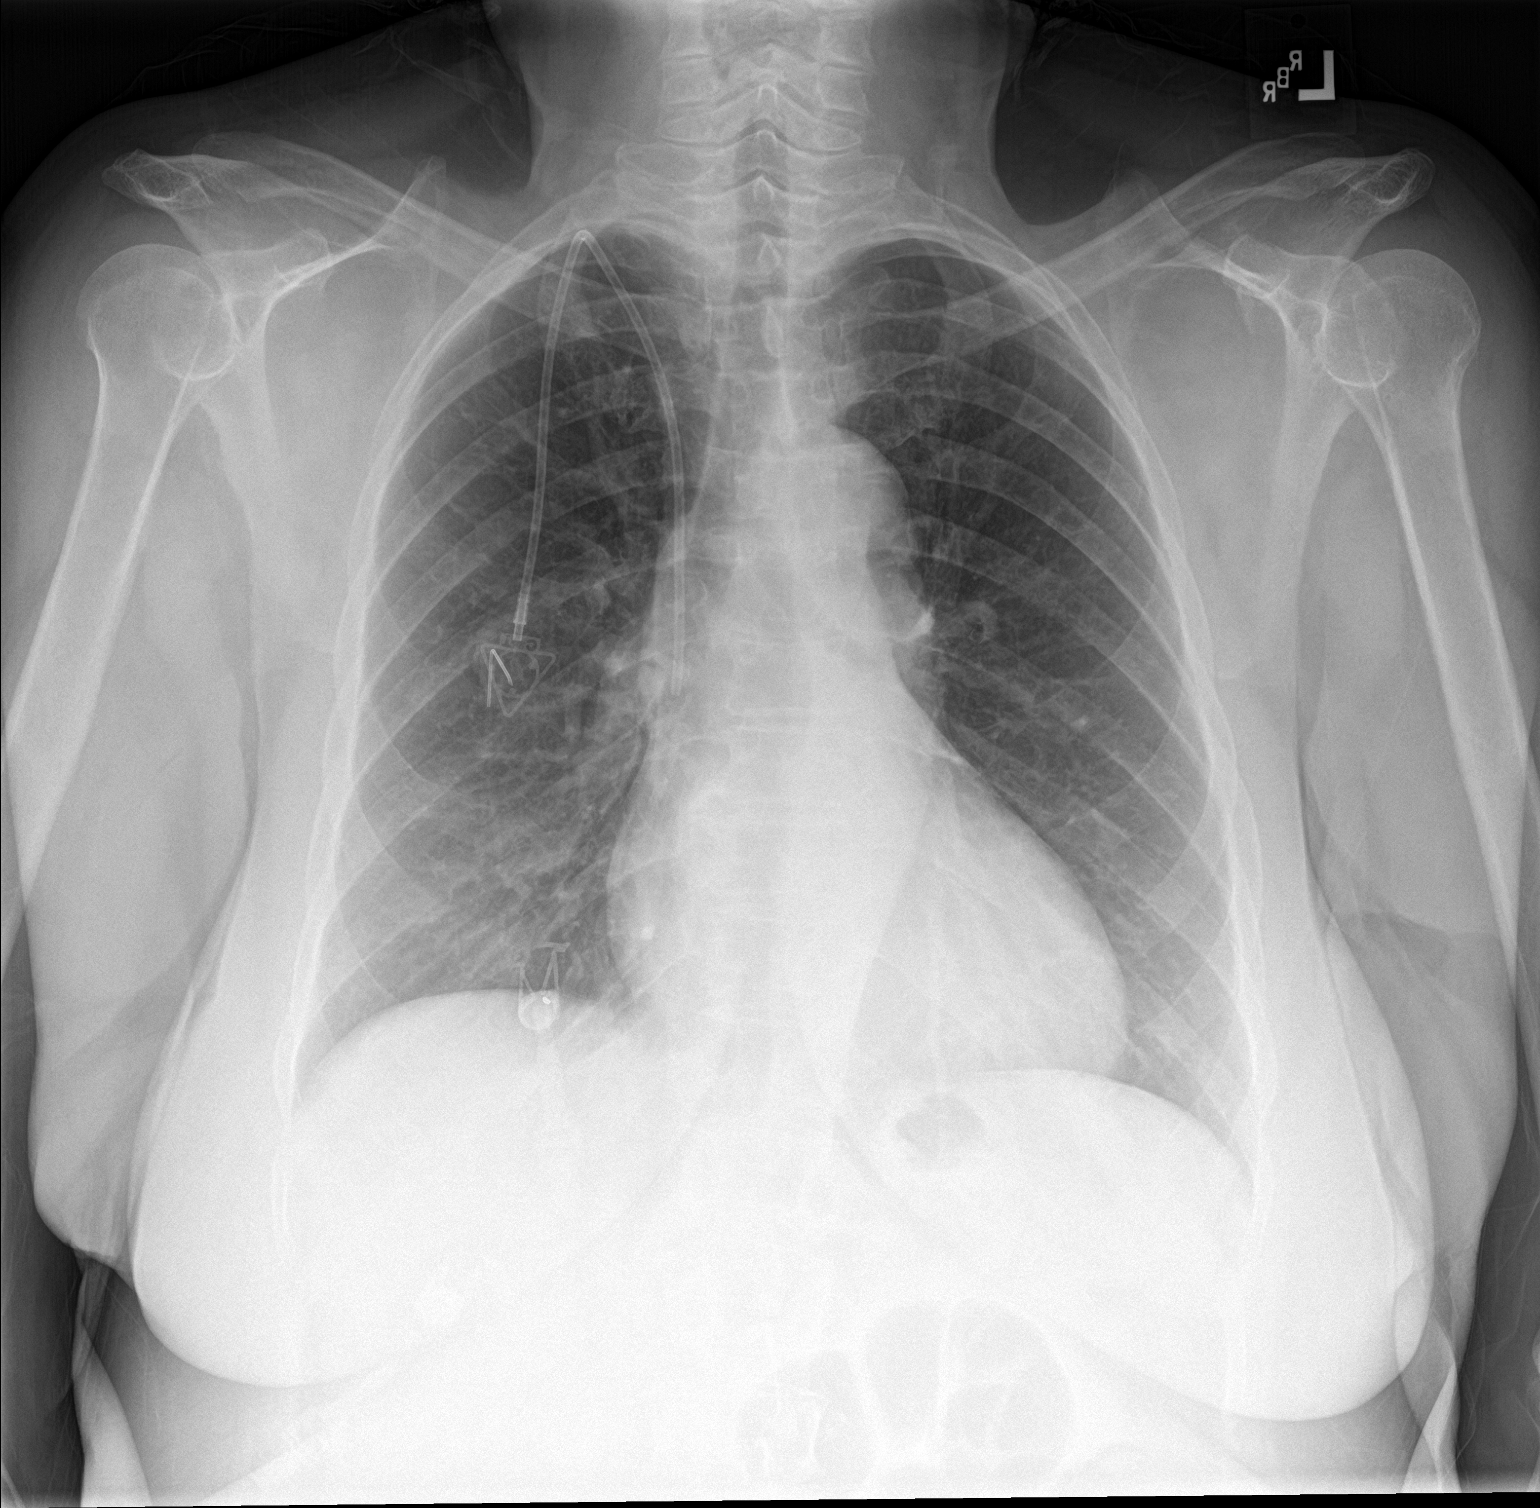

[chest lat]
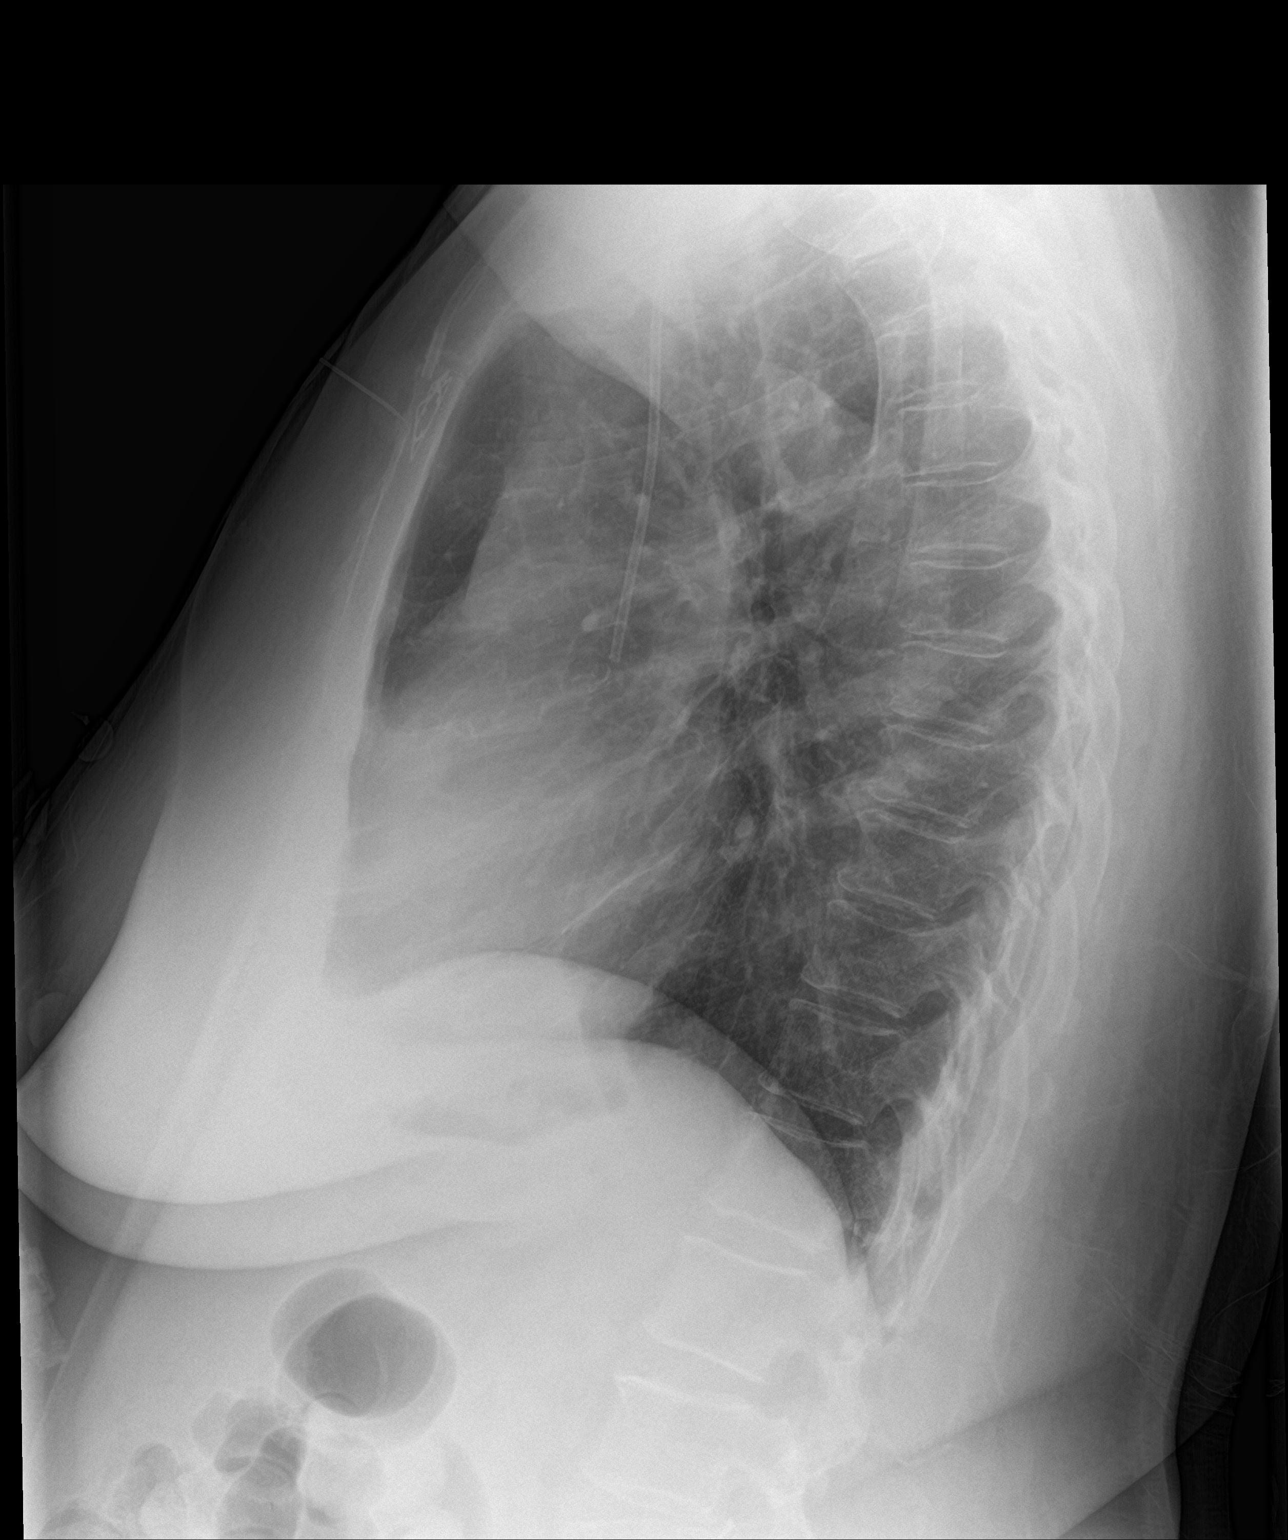

[2 of 2 positions shown; findings below may reference images not displayed]

FINDINGS: No active infiltrate or effusion is seen. A right-sided Port-A-Cath
remains with the tip seen to the lower SVC. No pneumothorax is
noted. Mediastinal hilar contours are unchanged and the heart is
within normal limits in size for age. No bony abnormality is seen.
IMPRESSION: No active cardiopulmonary disease. Right-sided Port-A-Cath tip
extends to the lower SVC.

## 2018-09-01 ENCOUNTER — Ambulatory Visit: Payer: Medicare Other | Admitting: Family Medicine

## 2018-09-10 ENCOUNTER — Encounter: Payer: Self-pay | Admitting: Family Medicine

## 2018-09-10 ENCOUNTER — Ambulatory Visit (INDEPENDENT_AMBULATORY_CARE_PROVIDER_SITE_OTHER): Payer: Medicare Other | Admitting: Family Medicine

## 2018-09-10 DIAGNOSIS — J0100 Acute maxillary sinusitis, unspecified: Secondary | ICD-10-CM | POA: Diagnosis not present

## 2018-09-10 MED ORDER — AMOXICILLIN 500 MG PO CAPS: 500.0000 mg | ORAL_CAPSULE | Freq: Three times a day (TID) | ORAL | 0 refills | Status: DC

## 2018-09-10 NOTE — Progress Notes (Signed)
Megan Tate - 72 y.o. female MRN [id number]  Date of birth: 04/09/1946  Subjective Chief Complaint  Patient presents with  . Cough    Has been ongoing for three weeks. Sinus pressure and drainage/sore throat. Admits to productive cough. She has not taken anything for her symptoms.     HPI Megan Tate is a 72 y.o. female with history of CLL and T2DM here today with complaint of sinus pain and pressure as well as cough, nasal congestion and L ear pressure.  She has had some intermittent feeling of fevers and chills.  Symptoms began about 3 weeks ago with worsening of sinus pain 3 days ago.   She denies wheezing, shortness of breath, chest pain, nausea, vomiting, diarrhea, headache.   ROS:  A comprehensive ROS was completed and negative except as noted per HPI  Allergies  Allergen Reactions  . Tylenol [Acetaminophen] Other (See Comments)    Messes with stomach    Past Medical History:  Diagnosis Date  . Cancer ([place]) CLL  . CLL (chronic lymphocytic leukemia) ([place]) 08/16/2014  . Diabetes mellitus without complication ([place])   . Hypertension     No past surgical history on file.  Social History   Socioeconomic History  . Marital status: Single    Spouse name: Not on file  . Number of children: Not on file  . Years of education: Not on file  . Highest education level: Not on file  Occupational History  . Not on file  Social Needs  . Financial resource strain: Not on file  . Food insecurity:    Worry: Not on file    Inability: Not on file  . Transportation needs:    Medical: Not on file    Non-medical: Not on file  Tobacco Use  . Smoking status: Never Smoker  . Smokeless tobacco: Never Used  . Tobacco comment: never used tobacco  Substance and Sexual Activity  . Alcohol use: Never    Alcohol/week: 0.0 standard drinks    Frequency: Never  . Drug use: Never  . Sexual activity: Not on file  Lifestyle  . Physical activity:    Days per week: Not on file    Minutes per  session: Not on file  . Stress: Not on file  Relationships  . Social connections:    Talks on phone: Not on file    Gets together: Not on file    Attends religious service: Not on file    Active member of club or organization: Not on file    Attends meetings of clubs or organizations: Not on file    Relationship status: Not on file  Other Topics Concern  . Not on file  Social History Narrative  . Not on file    No family history on file.  Health Maintenance  Topic Date Due  . FOOT EXAM  03/10/1957  . OPHTHALMOLOGY EXAM  03/10/1957  . TETANUS/TDAP  03/10/1966  . COLONOSCOPY  03/10/1997  . DEXA SCAN  03/10/2012  . PNA vac Low Risk Adult (1 of 2 - PCV13) 03/10/2012  . INFLUENZA VACCINE  11/07/2018 (Originally 05/01/2018)  . HEMOGLOBIN A1C  01/14/2019  . MAMMOGRAM  08/05/2020  . Hepatitis C Screening  Completed    ----------------------------------------------------------------------------------------------------------------------------------------------------------------------------------------------------------------- Physical Exam BP 128/67   Pulse (!) 54   Temp 98.2 F (36.8 C) (Oral)   Ht 5\' 4"  (1.626 m)   Wt 204 lb (92.5 kg)   SpO2 98%   BMI 35.02 kg/m  Physical Exam  Constitutional: She is oriented to person, place, and time. She appears well-nourished. No distress.  HENT:  Head: Normocephalic and atraumatic.  Mouth/Throat: Oropharynx is clear and moist.  L maxillary sinus tenderness Serous effusion L ear.    Eyes: No scleral icterus.  Neck: Neck supple. No thyromegaly present.  Cardiovascular: Normal rate, regular rhythm and normal heart sounds.  Pulmonary/Chest: Effort normal and breath sounds normal.  Lymphadenopathy:    She has no cervical adenopathy.  Neurological: She is alert and oriented to person, place, and time.  Skin: Skin is warm and dry.  Psychiatric: She has a normal mood and affect. Her behavior is normal.     ------------------------------------------------------------------------------------------------------------------------------------------------------------------------------------------------------------------- Assessment and Plan  Acute maxillary sinusitis -Rx for amoxicillin 500mg  tid -Increased fluids  -Recommend humidifier or nasal saline -F/u is symptoms not improving.

## 2018-09-10 NOTE — Patient Instructions (Signed)

## 2018-09-10 NOTE — Assessment & Plan Note (Signed)
-  Rx for amoxicillin 500mg  tid -Increased fluids  -Recommend humidifier or nasal saline -F/u is symptoms not improving.

## 2018-09-11 ENCOUNTER — Telehealth: Payer: Self-pay | Admitting: Hematology & Oncology

## 2018-09-11 NOTE — Telephone Encounter (Signed)
Patient called in to schedule PF appointment.  She was last seen for PF around 10/31

## 2018-09-15 ENCOUNTER — Other Ambulatory Visit: Payer: Self-pay

## 2018-09-15 ENCOUNTER — Inpatient Hospital Stay: Payer: Medicare Other | Attending: Hematology & Oncology

## 2018-09-15 DIAGNOSIS — Z452 Encounter for adjustment and management of vascular access device: Secondary | ICD-10-CM | POA: Insufficient documentation

## 2018-09-15 DIAGNOSIS — C9110 Chronic lymphocytic leukemia of B-cell type not having achieved remission: Secondary | ICD-10-CM | POA: Insufficient documentation

## 2018-09-15 MED ORDER — LIDOCAINE-PRILOCAINE 2.5-2.5 % EX CREA: TOPICAL_CREAM | CUTANEOUS | 4 refills | Status: DC

## 2018-09-15 NOTE — Patient Instructions (Signed)
Implanted Port Home Guide An implanted port is a type of central line that is placed under the skin. Central lines are used to provide IV access when treatment or nutrition needs to be given through a person's veins. Implanted ports are used for long-term IV access. An implanted port may be placed because:  You need IV medicine that would be irritating to the small veins in your hands or arms.  You need long-term IV medicines, such as antibiotics.  You need IV nutrition for a long period.  You need frequent blood draws for lab tests.  You need dialysis.  Implanted ports are usually placed in the chest area, but they can also be placed in the upper arm, the abdomen, or the leg. An implanted port has two main parts:  Reservoir. The reservoir is round and will appear as a small, raised area under your skin. The reservoir is the part where a needle is inserted to give medicines or draw blood.  Catheter. The catheter is a thin, flexible tube that extends from the reservoir. The catheter is placed into a large vein. Medicine that is inserted into the reservoir goes into the catheter and then into the vein.  How will I care for my incision site? Do not get the incision site wet. Bathe or shower as directed by your health care provider. How is my port accessed? Special steps must be taken to access the port:  Before the port is accessed, a numbing cream can be placed on the skin. This helps numb the skin over the port site.  Your health care provider uses a sterile technique to access the port. ? Your health care provider must put on a mask and sterile gloves. ? The skin over your port is cleaned carefully with an antiseptic and allowed to dry. ? The port is gently pinched between sterile gloves, and a needle is inserted into the port.  Only "non-coring" port needles should be used to access the port. Once the port is accessed, a blood return should be checked. This helps ensure that the port  is in the vein and is not clogged.  If your port needs to remain accessed for a constant infusion, a clear (transparent) bandage will be placed over the needle site. The bandage and needle will need to be changed every week, or as directed by your health care provider.  Keep the bandage covering the needle clean and dry. Do not get it wet. Follow your health care provider's instructions on how to take a shower or bath while the port is accessed.  If your port does not need to stay accessed, no bandage is needed over the port.  What is flushing? Flushing helps keep the port from getting clogged. Follow your health care provider's instructions on how and when to flush the port. Ports are usually flushed with saline solution or a medicine called heparin. The need for flushing will depend on how the port is used.  If the port is used for intermittent medicines or blood draws, the port will need to be flushed: ? After medicines have been given. ? After blood has been drawn. ? As part of routine maintenance.  If a constant infusion is running, the port may not need to be flushed.  How long will my port stay implanted? The port can stay in for as long as your health care provider thinks it is needed. When it is time for the port to come out, surgery will be   done to remove it. The procedure is similar to the one performed when the port was put in. When should I seek immediate medical care? When you have an implanted port, you should seek immediate medical care if:  You notice a bad smell coming from the incision site.  You have swelling, redness, or drainage at the incision site.  You have more swelling or pain at the port site or the surrounding area.  You have a fever that is not controlled with medicine.  This information is not intended to replace advice given to you by your health care provider. Make sure you discuss any questions you have with your health care provider. Document  Released: 09/17/2005 Document Revised: 02/23/2016 Document Reviewed: 05/25/2013 Elsevier Interactive Patient Education  2017 [employer]vier Inc[employer]  

## 2018-09-25 ENCOUNTER — Emergency Department (HOSPITAL_BASED_OUTPATIENT_CLINIC_OR_DEPARTMENT_OTHER): Payer: Medicare Other

## 2018-09-25 ENCOUNTER — Emergency Department (HOSPITAL_BASED_OUTPATIENT_CLINIC_OR_DEPARTMENT_OTHER)
Admission: EM | Admit: 2018-09-25 | Discharge: 2018-09-25 | Disposition: A | Discharge status: Home / Self Care | Payer: Medicare Other | Attending: Emergency Medicine | Admitting: Emergency Medicine

## 2018-09-25 ENCOUNTER — Other Ambulatory Visit: Payer: Self-pay

## 2018-09-25 ENCOUNTER — Encounter (HOSPITAL_BASED_OUTPATIENT_CLINIC_OR_DEPARTMENT_OTHER): Payer: Self-pay | Admitting: Emergency Medicine

## 2018-09-25 DIAGNOSIS — Z794 Long term (current) use of insulin: Secondary | ICD-10-CM | POA: Diagnosis not present

## 2018-09-25 DIAGNOSIS — Z79899 Other long term (current) drug therapy: Secondary | ICD-10-CM | POA: Diagnosis not present

## 2018-09-25 DIAGNOSIS — E119 Type 2 diabetes mellitus without complications: Secondary | ICD-10-CM | POA: Diagnosis not present

## 2018-09-25 DIAGNOSIS — M25551 Pain in right hip: Secondary | ICD-10-CM

## 2018-09-25 DIAGNOSIS — I10 Essential (primary) hypertension: Secondary | ICD-10-CM | POA: Diagnosis not present

## 2018-09-25 MED ORDER — LIDOCAINE 5 % EX PTCH: 1.0000 | MEDICATED_PATCH | CUTANEOUS | 0 refills | Status: AC

## 2018-09-25 NOTE — Discharge Instructions (Addendum)
There was no hip fracture today.  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  Follow-up your primary care doctor for long-term pain plan.  May need orthopedic follow-up.

## 2018-09-25 NOTE — ED Triage Notes (Signed)
R hip pain for "months". Denies injury.

## 2018-09-25 NOTE — ED Provider Notes (Signed)
[place] [facility] EMERGENCY DEPARTMENT Provider Note   CSN: [id number] Arrival date & time: 09/25/18  0849     History   Chief Complaint Chief Complaint  Patient presents with  . Hip Pain    HPI Megan Tate is a 72 y.o. female.  The history is provided by the patient.  Hip Pain  This is a chronic problem. The current episode started more than 1 week ago. The problem occurs daily. Pertinent negatives include no chest pain, no abdominal pain, no headaches and no shortness of breath. Nothing aggravates the symptoms. Nothing relieves the symptoms. She has tried nothing for the symptoms. The treatment provided no relief.    Past Medical History:  Diagnosis Date  . Cancer ([place]) CLL  . CLL (chronic lymphocytic leukemia) ([place]) 08/16/2014  . Diabetes mellitus without complication ([place])   . Hypertension     Patient Active Problem List   Diagnosis Date Noted  . Acute maxillary sinusitis 07/28/2018  . Labyrinthitis 07/28/2018  . Gait instability 07/15/2018  . Seasonal allergic rhinitis 07/15/2018  . IDA (iron deficiency anemia) 05/13/2018  . Benign essential hypertension 02/04/2016  . 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 Type 2 diabetes mellitus without complication, with long-term current use of insulin ([place]) 02/04/2016  . Gout 02/04/2016  . Hypercalcemia 02/04/2016  . Obesity, morbid ([place]) 02/04/2016  . Swollen lymph nodes 02/04/2016  . Vaginal yeast infection 09/20/2014  . CLL (chronic lymphocytic leukemia) ([place]) 08/16/2014    History reviewed. No pertinent surgical history.   OB History   No obstetric history on file.      Home Medications    Prior to Admission medications   Medication Sig Start Date End Date Taking? Authorizing Provider  amoxicillin (AMOXIL) 500 MG capsule Take 1 capsule (500 mg total) by mouth 3 (three) times daily. 09/10/18   Luetta Nutting, DO  Blood Glucose Monitoring Suppl (ONE TOUCH ULTRA SYSTEM KIT) W/DEVICE KIT 1 kit by  Does not apply route once. Check blood sugar daily before meals and at bedtime. 11/19/14   Volanda Napoleon, MD  cloNIDine (CATAPRES) 0.2 MG tablet TAKE 1 TABLET BY MOUTH TWICE A DAY 08/05/18   Luetta Nutting, DO  dorzolamide-timolol (COSOPT) 22.3-6.8 MG/ML ophthalmic solution Place 1 drop into both eyes 2 (two) times daily.  03/18/14   [provider]  doxepin (SINEQUAN) 10 MG capsule Take 1 capsule by mouth daily. 09/07/16   [provider]  fluticasone (FLONASE) 50 MCG/ACT nasal spray Place 2 sprays into both nostrils daily. 06/03/18   Jacqlyn Larsen, PA-C  folic acid (FOLVITE) 1 MG tablet Take 2 tablets (2 mg total) by mouth daily. 08/01/17   Volanda Napoleon, MD  glucose blood (ONE TOUCH ULTRA TEST) test strip USE TO TEST BLOOD SUGAR BEFORE MEALS AND AT BEDTIME 09/30/17   Volanda Napoleon, MD  Insulin Detemir (LEVEMIR FLEXTOUCH) 100 UNIT/ML Pen Inject 10 Units into the skin every morning. 07/15/18   Luetta Nutting, DO  Insulin Pen Needle (BD PEN NEEDLE NANO U/F) 32G X 4 MM MISC Patient is to check blood sugar daily before meals and bedtime. Dx. E11.9 07/23/18   Luetta Nutting, DO  Iron-FA-B Cmp-C-Biot-Probiotic (FUSION PLUS) CAPS TAKE 1 CAPSULE BY MOUTH EVERY DAY IN THE MORNING 08/19/18   Volanda Napoleon, MD  JANUMET XR 50-1000 MG TB24 Take 2 tablets by mouth daily. 07/06/15   [provider]  lidocaine (LIDODERM) 5 % Place 1 patch onto the skin daily for 30 doses. Remove &  Discard patch within 12 hours or as directed by MD 09/25/18 10/25/18  Lennice Sites, DO  lidocaine-prilocaine (EMLA) cream Apply to Port-A-Cath site 1 hour prior to treatment 09/15/18   Volanda Napoleon, MD  LINZESS 145 MCG CAPS capsule Take 1 capsule by mouth daily. 09/10/16   [provider]  losartan (COZAAR) 100 MG tablet Take 1 tablet (100 mg total) by mouth daily. 07/15/18   Luetta Nutting, DO  meclizine (ANTIVERT) 25 MG tablet Take 1 tablet (25 mg total) by mouth 3 (three) times daily as  needed for dizziness. 07/28/18   Libby Maw, MD  metoprolol (TOPROL-XL) 200 MG 24 hr tablet Take 1 tablet (200 mg total) by mouth daily. 07/15/18   Luetta Nutting, DO  rosuvastatin (CRESTOR) 20 MG tablet Take 20 mg by mouth daily.    [provider]  spironolactone (ALDACTONE) 50 MG tablet Take 1 tablet (50 mg total) by mouth daily. 08/07/18   Volanda Napoleon, MD  tiZANidine (ZANAFLEX) 2 MG tablet Take 1 tablet (2 mg total) by mouth every 8 (eight) hours as needed for muscle spasms. 11/18/17   Volanda Napoleon, MD  UNABLE TO FIND One Touch Ultra Blood Glucose Monitoring Meter. Dx E11.9 10/02/17   Volanda Napoleon, MD    Family History No family history on file.  Social History Social History   Tobacco Use  . Smoking status: Never Smoker  . Smokeless tobacco: Never Used  . Tobacco comment: never used tobacco  Substance Use Topics  . Alcohol use: Never    Alcohol/week: 0.0 standard drinks    Frequency: Never  . Drug use: Never     Allergies   Tylenol [acetaminophen]   Review of Systems Review of Systems  Constitutional: Negative for chills and fever.  HENT: Negative for ear pain and sore throat.   Eyes: Negative for pain and visual disturbance.  Respiratory: Negative for cough and shortness of breath.   Cardiovascular: Negative for chest pain and palpitations.  Gastrointestinal: Negative for abdominal pain and vomiting.  Genitourinary: Negative for dysuria and hematuria.  Musculoskeletal: Positive for arthralgias. Negative for back pain, gait problem, myalgias, neck pain and neck stiffness.  Skin: Negative for color change and rash.  Neurological: Negative for tremors, seizures, syncope and headaches.  All other systems reviewed and are negative.    Physical Exam Updated Vital Signs  ED Triage Vitals  Enc Vitals Group     BP 09/25/18 0857 (!) 204/91     Pulse Rate 09/25/18 0857 (!) 57     Resp 09/25/18 0857 19     Temp 09/25/18 0857 97.9 F (36.6  C)     Temp Source 09/25/18 0857 Oral     SpO2 09/25/18 0857 99 %     Weight 09/25/18 0856 196 lb (88.9 kg)     Height 09/25/18 0856 '5\' 4"'  (1.626 m)     Head Circumference --      Peak Flow --      Pain Score 09/25/18 0856 8     Pain Loc --      Pain Edu? --      Excl. in GC? --     Physical Exam Musculoskeletal: Normal range of motion.        General: No swelling, tenderness, deformity or signs of injury.     Right lower leg: No edema.     Left lower leg: No edema.  Skin:    General: Skin is warm.  Capillary Refill: Capillary refill takes less than 2 seconds.  Neurological:     General: No focal deficit present.     Mental Status: She is disoriented.     Cranial Nerves: No cranial nerve deficit.     Sensory: No sensory deficit.     Motor: No weakness.     Coordination: Coordination normal.     Gait: Gait normal.      ED Treatments / Results  Labs (all labs ordered are listed, but only abnormal results are displayed) Labs Reviewed - No data to display  EKG None  Radiology Dg Hip Unilat With Pelvis 2-3 Views Right  Result Date: 09/25/2018 CLINICAL DATA:  Patient c/o R hip pain for "months", just states that it is getting worse, Denies injury, hx of diabetes, CLL, no other complaintspain EXAM: DG HIP (WITH OR WITHOUT PELVIS) 2-3V RIGHT COMPARISON:  07/01/2017 FINDINGS: Hips are located. No evidence of pelvic fracture or sacral fracture. Dedicated view of the RIGHT hip demonstrates no femoral neck fracture. There is sclerosis of the acetabular roofs greater on the RIGHT. There is joint space narrowing on the RIGHT. IMPRESSION: No pelvic fracture or RIGHT hip fracture. 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 Electronically Signed   By: Suzy Bouchard M.D.   On: 09/25/2018 09:39    Procedures Procedures (including critical care time)  Medications Ordered in ED Medications - No data to display   Initial Impression / Assessment and Plan / ED Course  I  have reviewed the triage vital signs and the nursing notes.  Pertinent labs & imaging results that were available during my care of the patient were reviewed by me and considered in my medical decision making (see chart for details).     Megan Tate is a 72 year old female with history of diabetes, hypertension, leukemia who presents to the ED with right chronic hip pain.  Patient with normal vitals.  No fever.  No new injuries.  Ongoing right hip pain.  Patient uses lidocaine patches intermittently for pain.  X-ray showed no fracture.  Shows continued right hip arthritis.  Patient given prescription for lidocaine patch.  Patient was neuromuscularly and neurovascularly intact.  Recommend follow-up with primary care doctor for long-term pain management. May need to see an orthopedic doctor chronic pain doctor. Discharged from ED in good condition.  Given return precautions.  This chart was dictated using voice recognition software.  Despite best efforts to proofread,  errors can occur which can change the documentation meaning.   Final Clinical Impressions(s) / ED Diagnoses   Final diagnoses:  Right hip pain    ED Discharge Orders         Ordered    lidocaine (LIDODERM) 5 %  Every 24 hours     09/25/18 1005           Osborne, Deaunna Olarte, DO 09/25/18 1010

## 2018-09-29 ENCOUNTER — Other Ambulatory Visit: Payer: Self-pay | Admitting: *Deleted

## 2018-09-29 MED ORDER — FERROUS SULFATE ER 50 MG PO TBCR: 1.0000 | EXTENDED_RELEASE_TABLET | Freq: Every day | ORAL | 11 refills | Status: DC

## 2018-09-29 NOTE — Telephone Encounter (Signed)
Patient's insurance does not cover fusion plus and it's too expensive for patient to purchase. She is requesting a new medication be prescribed.   Spoke to Dr Marin Olp and new prescription sent. Patient informed of new prescription, but also informed that some insurances will not cover the cost of iron supplementation and if this new medication is not covered, she can purchase any OTC iron supplement. She understood.

## 2018-10-04 ENCOUNTER — Encounter (HOSPITAL_BASED_OUTPATIENT_CLINIC_OR_DEPARTMENT_OTHER): Payer: Self-pay | Admitting: Emergency Medicine

## 2018-10-04 ENCOUNTER — Emergency Department (HOSPITAL_BASED_OUTPATIENT_CLINIC_OR_DEPARTMENT_OTHER)
Admission: EM | Admit: 2018-10-04 | Discharge: 2018-10-04 | Disposition: A | Discharge status: Home / Self Care | Payer: Medicare Other | Attending: Emergency Medicine | Admitting: Emergency Medicine

## 2018-10-04 ENCOUNTER — Other Ambulatory Visit: Payer: Self-pay

## 2018-10-04 ENCOUNTER — Emergency Department (HOSPITAL_BASED_OUTPATIENT_CLINIC_OR_DEPARTMENT_OTHER): Payer: Medicare Other

## 2018-10-04 DIAGNOSIS — Z79899 Other long term (current) drug therapy: Secondary | ICD-10-CM | POA: Insufficient documentation

## 2018-10-04 DIAGNOSIS — Z794 Long term (current) use of insulin: Secondary | ICD-10-CM | POA: Insufficient documentation

## 2018-10-04 DIAGNOSIS — E119 Type 2 diabetes mellitus without complications: Secondary | ICD-10-CM | POA: Diagnosis not present

## 2018-10-04 DIAGNOSIS — Y999 Unspecified external cause status: Secondary | ICD-10-CM | POA: Insufficient documentation

## 2018-10-04 DIAGNOSIS — M546 Pain in thoracic spine: Secondary | ICD-10-CM | POA: Diagnosis not present

## 2018-10-04 DIAGNOSIS — Y939 Activity, unspecified: Secondary | ICD-10-CM | POA: Diagnosis not present

## 2018-10-04 DIAGNOSIS — C9110 Chronic lymphocytic leukemia of B-cell type not having achieved remission: Secondary | ICD-10-CM | POA: Insufficient documentation

## 2018-10-04 DIAGNOSIS — M549 Dorsalgia, unspecified: Secondary | ICD-10-CM

## 2018-10-04 DIAGNOSIS — Y929 Unspecified place or not applicable: Secondary | ICD-10-CM | POA: Insufficient documentation

## 2018-10-04 DIAGNOSIS — I10 Essential (primary) hypertension: Secondary | ICD-10-CM | POA: Diagnosis not present

## 2018-10-04 NOTE — Discharge Instructions (Signed)
Take 4 over the counter ibuprofen tablets 3 times a day or 2 over-the-counter naproxen tablets twice a day for pain. Also take tylenol 1000mg (2 extra strength) four times a day.    Follow-up with your family doctor.  Your blood pressure was high today please call them on Monday for a recheck this week.  Return to the ED for worsening back pain trouble breathing abdominal pain or blood in your urine
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
## 2018-10-08 ENCOUNTER — Other Ambulatory Visit: Payer: Self-pay | Admitting: Hematology & Oncology

## 2018-10-08 DIAGNOSIS — D539 Nutritional anemia, unspecified: Secondary | ICD-10-CM

## 2018-10-22 ENCOUNTER — Other Ambulatory Visit: Payer: Self-pay | Admitting: Family Medicine

## 2018-10-22 DIAGNOSIS — C9110 Chronic lymphocytic leukemia of B-cell type not having achieved remission: Secondary | ICD-10-CM

## 2018-10-23 NOTE — Telephone Encounter (Signed)
Patient needs to be seen.

## 2018-10-24 NOTE — Telephone Encounter (Signed)
Sent in enough refills, patient aware need to schedule an appointment

## 2018-10-27 ENCOUNTER — Inpatient Hospital Stay: Payer: Medicare Other | Attending: Hematology & Oncology

## 2018-10-27 ENCOUNTER — Other Ambulatory Visit: Payer: Self-pay

## 2018-10-27 VITALS — BP 185/70 | HR 73 | Temp 98.0°F | Resp 18

## 2018-10-27 DIAGNOSIS — C9110 Chronic lymphocytic leukemia of B-cell type not having achieved remission: Secondary | ICD-10-CM | POA: Insufficient documentation

## 2018-10-27 DIAGNOSIS — Z452 Encounter for adjustment and management of vascular access device: Secondary | ICD-10-CM | POA: Diagnosis not present

## 2018-10-27 DIAGNOSIS — Z95828 Presence of other vascular implants and grafts: Secondary | ICD-10-CM

## 2018-10-27 DIAGNOSIS — Z1231 Encounter for screening mammogram for malignant neoplasm of breast: Secondary | ICD-10-CM

## 2018-10-27 MED ORDER — SODIUM CHLORIDE 0.9% FLUSH
10.0000 mL | INTRAVENOUS | Status: DC | PRN
  Administered 2018-10-27: 10 mL via INTRAVENOUS
  Filled 2018-10-27: qty 10

## 2018-10-27 MED ORDER — HEPARIN SOD (PORK) LOCK FLUSH 100 UNIT/ML IV SOLN
500.0000 [IU] | Freq: Once | INTRAVENOUS | Status: AC
  Administered 2018-10-27: 500 [IU] via INTRAVENOUS
  Filled 2018-10-27: qty 5

## 2018-12-04 ENCOUNTER — Other Ambulatory Visit: Payer: Self-pay

## 2018-12-04 ENCOUNTER — Inpatient Hospital Stay: Payer: Medicare Other

## 2018-12-04 ENCOUNTER — Encounter: Payer: Self-pay | Admitting: Hematology & Oncology

## 2018-12-04 ENCOUNTER — Inpatient Hospital Stay: Payer: Medicare Other | Attending: Hematology & Oncology | Admitting: Hematology & Oncology

## 2018-12-04 VITALS — BP 241/71 | HR 50 | Temp 98.1°F | Resp 18 | Wt 203.0 lb

## 2018-12-04 DIAGNOSIS — Z9221 Personal history of antineoplastic chemotherapy: Secondary | ICD-10-CM | POA: Diagnosis not present

## 2018-12-04 DIAGNOSIS — Z95828 Presence of other vascular implants and grafts: Secondary | ICD-10-CM

## 2018-12-04 DIAGNOSIS — C9110 Chronic lymphocytic leukemia of B-cell type not having achieved remission: Secondary | ICD-10-CM | POA: Insufficient documentation

## 2018-12-04 DIAGNOSIS — Z1231 Encounter for screening mammogram for malignant neoplasm of breast: Secondary | ICD-10-CM

## 2018-12-04 DIAGNOSIS — D508 Other iron deficiency anemias: Secondary | ICD-10-CM

## 2018-12-04 DIAGNOSIS — E119 Type 2 diabetes mellitus without complications: Secondary | ICD-10-CM | POA: Diagnosis not present

## 2018-12-04 LAB — CMP (CANCER CENTER ONLY)
ALK PHOS: 73 U/L (ref 38–126)
ALT: 7 U/L (ref 0–44)
ANION GAP: 7 (ref 5–15)
AST: 10 U/L — ABNORMAL LOW (ref 15–41)
Albumin: 4.1 g/dL (ref 3.5–5.0)
BUN: 13 mg/dL (ref 8–23)
CALCIUM: 11.2 mg/dL — AB (ref 8.9–10.3)
CO2: 29 mmol/L (ref 22–32)
Chloride: 102 mmol/L (ref 98–111)
Creatinine: 1.32 mg/dL — ABNORMAL HIGH (ref 0.44–1.00)
GFR, Est AFR Am: 47 mL/min — ABNORMAL LOW (ref >60)
GFR, Estimated: 40 mL/min — ABNORMAL LOW (ref >60)
Glucose, Bld: 224 mg/dL — ABNORMAL HIGH (ref 70–99)
POTASSIUM: 3.8 mmol/L (ref 3.5–5.1)
Sodium: 138 mmol/L (ref 135–145)
TOTAL PROTEIN: 6.2 g/dL — AB (ref 6.5–8.1)
Total Bilirubin: 0.6 mg/dL (ref 0.3–1.2)

## 2018-12-04 LAB — CBC WITH DIFFERENTIAL (CANCER CENTER ONLY)
Abs Immature Granulocytes: 0.01 10*3/uL (ref 0.00–0.07)
Basophils Absolute: 0 10*3/uL (ref 0.0–0.1)
Basophils Relative: 1 %
Eosinophils Absolute: 0.2 10*3/uL (ref 0.0–0.5)
Eosinophils Relative: 4 %
HEMATOCRIT: 36.5 % (ref 36.0–46.0)
HEMOGLOBIN: 11.8 g/dL — AB (ref 12.0–15.0)
Immature Granulocytes: 0 %
Lymphocytes Relative: 35 %
Lymphs Abs: 2.1 10*3/uL (ref 0.7–4.0)
MCH: 25 pg — ABNORMAL LOW (ref 26.0–34.0)
MCHC: 32.3 g/dL (ref 30.0–36.0)
MCV: 77.3 fL — ABNORMAL LOW (ref 80.0–100.0)
Monocytes Absolute: 1.1 10*3/uL — ABNORMAL HIGH (ref 0.1–1.0)
Monocytes Relative: 18 %
Neutro Abs: 2.6 10*3/uL (ref 1.7–7.7)
Neutrophils Relative %: 42 %
Platelet Count: 175 10*3/uL (ref 150–400)
RBC: 4.72 MIL/uL (ref 3.87–5.11)
RDW: 14.6 % (ref 11.5–15.5)
WBC Count: 6.2 10*3/uL (ref 4.0–10.5)
nRBC: 0 % (ref 0.0–0.2)

## 2018-12-04 LAB — LACTATE DEHYDROGENASE: LDH: 152 U/L (ref 98–192)

## 2018-12-04 MED ORDER — SODIUM CHLORIDE 0.9% FLUSH
10.0000 mL | INTRAVENOUS | Status: DC | PRN
  Administered 2018-12-04: 10 mL via INTRAVENOUS
  Filled 2018-12-04: qty 10

## 2018-12-04 MED ORDER — SODIUM CHLORIDE 0.9% FLUSH
10.0000 mL | INTRAVENOUS | Status: DC | PRN
  Filled 2018-12-04: qty 10

## 2018-12-04 MED ORDER — HEPARIN SOD (PORK) LOCK FLUSH 100 UNIT/ML IV SOLN
500.0000 [IU] | Freq: Once | INTRAVENOUS | Status: DC
  Filled 2018-12-04: qty 5

## 2018-12-04 MED ORDER — HEPARIN SOD (PORK) LOCK FLUSH 100 UNIT/ML IV SOLN
500.0000 [IU] | Freq: Once | INTRAVENOUS | Status: AC
  Administered 2018-12-04: 500 [IU] via INTRAVENOUS
  Filled 2018-12-04: qty 5

## 2018-12-04 NOTE — Progress Notes (Signed)
Hematology and Oncology Follow Up Visit  Megan Tate [id number] August 29, 1946 73 y.o. 12/04/2018   Principle Diagnosis:  Chronic lymphocytic leukemia- Trisomy 12  Past Therapy:             Status post 6 cycles of Gazyva/Bendamustine - completed4/2016 Maintenance Gazyva every 3 months s/p cycle 11 -completed 2 years in June 2018  Current Therapy:   Observation   Interim History:  [name] is here today for follow-up.  She comes by herself.  Usually, her husband is with her.  He is busy getting ready for his church sermon on Sunday.  She is had no problems since we last saw her.  She had a good Thanksgiving and Christmas holiday.  She is had no problems with infection.  There is been no cold.  She is had no flu.  She is had no pneumonia.  She is not noted any palpable lymph nodes.  It looks like she is lost a little bit of weight.  Hopefully this will help with her diabetes.  She has had no change in bowel or bladder habits.  There is been no leg swelling.  She has had no rashes.  Currently, her performance status is ECOG 1.    Medications:  Allergies as of 12/04/2018      Reactions   Tylenol [acetaminophen] Other (See Comments)   Messes with stomach      Medication List       Accurate as of December 04, 2018  8:46 AM. Always use your most recent med list.        amoxicillin 500 MG capsule Commonly known as:  AMOXIL Take 1 capsule (500 mg total) by mouth 3 (three) times daily.   cloNIDine 0.2 MG tablet Commonly known as:  CATAPRES TAKE 1 TABLET BY MOUTH TWICE A DAY   dorzolamide-timolol 22.3-6.8 MG/ML ophthalmic solution Commonly known as:  COSOPT Place 1 drop into both eyes 2 (two) times daily.   doxepin 10 MG capsule Commonly known as:  SINEQUAN Take 1 capsule by mouth daily.   Ferrous Sulfate 50 MG Tbcr Commonly known as:  SLOW RELEASE IRON Take 1 tablet by mouth daily.   fluticasone 50 MCG/ACT nasal spray Commonly known as:  FLONASE Place 2 sprays  into both nostrils daily.   folic acid 1 MG tablet Commonly known as:  FOLVITE TAKE 2 TABLETS (2 MG TOTAL) BY MOUTH DAILY.   glucose blood test strip Commonly known as:  ONE TOUCH ULTRA TEST USE TO TEST BLOOD SUGAR BEFORE MEALS AND AT BEDTIME   Insulin Detemir 100 UNIT/ML Pen Commonly known as:  LEVEMIR FLEXTOUCH Inject 10 Units into the skin every morning.   Insulin Pen Needle 32G X 4 MM Misc Commonly known as:  BD PEN NEEDLE NANO U/F Patient is to check blood sugar daily before meals and bedtime. Dx. E11.9   lidocaine-prilocaine cream Commonly known as:  EMLA Apply to Port-A-Cath site 1 hour prior to treatment   LINZESS 145 MCG Caps capsule Generic drug:  linaclotide Take 1 capsule by mouth daily.   losartan 100 MG tablet Commonly known as:  COZAAR Take 1 tablet (100 mg total) by mouth daily.   meclizine 25 MG tablet Commonly known as:  ANTIVERT Take 1 tablet (25 mg total) by mouth 3 (three) times daily as needed for dizziness.   metoprolol 200 MG 24 hr tablet Commonly known as:  TOPROL-XL Take 1 tablet (200 mg total) by mouth daily.   [place]  KIT w/Device Kit 1 kit by Does not apply route once. Check blood sugar daily before meals and at bedtime.   rosuvastatin 20 MG tablet Commonly known as:  CRESTOR Take 20 mg by mouth daily.   SitaGLIPtin-MetFORMIN HCl 50-1000 MG Tb24 Commonly known as:  JANUMET XR Take 2 tablets by mouth daily.   spironolactone 50 MG tablet Commonly known as:  ALDACTONE Take 1 tablet (50 mg total) by mouth daily.   tiZANidine 2 MG tablet Commonly known as:  ZANAFLEX Take 1 tablet (2 mg total) by mouth every 8 (eight) hours as needed for muscle spasms.   UNABLE TO FIND One Touch Ultra Blood Glucose Monitoring Meter. Dx E11.9       Allergies:  Allergies  Allergen Reactions  . Tylenol [Acetaminophen] Other (See Comments)    Messes with stomach    Past Medical History, Surgical history, Social history, and Family  History were reviewed and updated.  Review of Systems: Review of Systems  Constitutional: Negative.   HENT: Negative.   Eyes: Negative.   Respiratory: Negative.   Cardiovascular: Negative.   Gastrointestinal: Negative.   Genitourinary: Negative.   Musculoskeletal: Negative.   Skin: Negative.   Neurological: Negative.   Endo/Heme/Allergies: Negative.   Psychiatric/Behavioral: Negative.      Physical Exam:  vitals were not taken for this visit.   Wt Readings from Last 3 Encounters:  10/04/18 195 lb 15.8 oz (88.9 kg)  09/25/18 196 lb (88.9 kg)  09/10/18 204 lb (92.5 kg)    Physical Exam Vitals signs reviewed.  HENT:     Head: Normocephalic and atraumatic.  Eyes:     Pupils: Pupils are equal, round, and reactive to light.  Neck:     Musculoskeletal: Normal range of motion.  Cardiovascular:     Rate and Rhythm: Normal rate and regular rhythm.     Heart sounds: Normal heart sounds.  Pulmonary:     Effort: Pulmonary effort is normal.     Breath sounds: Normal breath sounds.  Abdominal:     General: Bowel sounds are normal.     Palpations: Abdomen is soft.  Musculoskeletal: Normal range of motion.        General: No tenderness or deformity.  Lymphadenopathy:     Cervical: No cervical adenopathy.  Skin:    General: Skin is warm and dry.     Findings: No erythema or rash.  Neurological:     Mental Status: She is alert and oriented to person, place, and time.  Psychiatric:        Behavior: Behavior normal.        Thought Content: Thought content normal.        Judgment: Judgment normal.      Lab Results  Component Value Date   WBC 5.4 07/28/2018   HGB 10.6 (L) 07/28/2018   HCT 32.5 (L) 07/28/2018   MCV 74.7 (L) 07/28/2018   PLT 264.0 07/28/2018   Lab Results  Component Value Date   FERRITIN 442 (H) 07/31/2018   IRON 88 07/31/2018   TIBC 253 07/31/2018   UIBC 165 07/31/2018   IRONPCTSAT 35 07/31/2018   Lab Results  Component Value Date   RETICCTPCT  1.1 05/08/2018   RBC 4.35 07/28/2018   RETICCTABS 65.4 04/07/2014   Lab Results  Component Value Date   KAPLAMBRATIO 1.59 03/28/2017   Lab Results  Component Value Date   IGGSERUM 539 (L) 03/28/2017   IGA 41 (L) 03/30/2015   IGMSERUM <5 (L) 03/28/2017  Lab Results  Component Value Date   TOTALPROTELP 5.5 (L) 03/30/2015   ALBUMINELP 3.4 (L) 03/30/2015   A1GS 0.3 03/30/2015   A2GS 0.8 03/30/2015   BETS 0.4 03/30/2015   BETA2SER 0.2 03/30/2015   GAMS 0.5 (L) 03/30/2015   MSPIKE Not Observed 03/28/2017   SPEI * 03/30/2015     Chemistry      Component Value Date/Time   NA 137 07/28/2018 0857   NA 142 09/05/2017 0751   K 4.3 07/28/2018 0857   K 3.8 09/05/2017 0751   CL 101 07/28/2018 0857   CL 104 09/05/2017 0751   CO2 29 07/28/2018 0857   CO2 27 09/05/2017 0751   BUN 13 07/28/2018 0857   BUN 11 09/05/2017 0751   CREATININE 1.33 (H) 07/28/2018 0857   CREATININE 1.40 (H) 05/08/2018 1055   CREATININE 1.1 09/05/2017 0751      Component Value Date/Time   CALCIUM 11.0 (H) 07/28/2018 0857   CALCIUM 10.8 (H) 09/05/2017 0751   ALKPHOS 59 07/28/2018 0857   ALKPHOS 88 (H) 09/05/2017 0751   AST 17 07/28/2018 0857   AST 19 05/08/2018 1055   ALT 11 07/28/2018 0857   ALT 14 05/08/2018 1055   ALT 14 09/05/2017 0751   BILITOT 0.6 07/28/2018 0857   BILITOT 0.7 05/08/2018 1055      Impression and Plan: Ms. Poppe is a pleasant 73 yo African American female with chronic lymphocytic leukemia. She completed treatment with Gazyva/Bendamustine in April 2016 and maintenance Gazyva in June 2018.   For right now, everything looks pretty stable with her CLL.  I just do not see any issues with respect to the CLL recurring or progressing.  We will get her back in 4 months.  I will have her come back in 2 months for a Port-A-Cath flush.  She knows that she can always come back to see [place] at any time if she has problems.  Volanda Napoleon, MD 3/5/20208:46 AM

## 2018-12-04 NOTE — Patient Instructions (Signed)
Implanted Port Insertion, Care After  This sheet gives you information about how to care for yourself after your procedure. Your health care provider may also give you more specific instructions. If you have problems or questions, contact your health care provider.  What can I expect after the procedure?  After the procedure, it is common to have:  · Discomfort at the port insertion site.  · Bruising on the skin over the port. This should improve over 3-4 days.  Follow these instructions at home:  Port care  · After your port is placed, you will get a manufacturer's information card. The card has information about your port. Keep this card with you at all times.  · Take care of the port as told by your health care provider. Ask your health care provider if you or a family member can get training for taking care of the port at home. A home health care nurse may also take care of the port.  · Make sure to remember what type of port you have.  Incision care         · Follow instructions from your health care provider about how to take care of your port insertion site. Make sure you:  ? Wash your hands with soap and water before and after you change your bandage (dressing). If soap and water are not available, use hand sanitizer.  ? Change your dressing as told by your health care provider.  ? Leave stitches (sutures), skin glue, or adhesive strips in place. These skin closures may need to stay in place for 2 weeks or longer. If adhesive strip edges start to loosen and curl up, you may trim the loose edges. Do not remove adhesive strips completely unless your health care provider tells you to do that.  · Check your port insertion site every day for signs of infection. Check for:  ? Redness, swelling, or pain.  ? Fluid or blood.  ? Warmth.  ? Pus or a bad smell.  Activity  · Return to your normal activities as told by your health care provider. Ask your health care provider what activities are safe for you.  · Do not  lift anything that is heavier than 10 lb (4.5 kg), or the limit that you are told, until your health care provider says that it is safe.  General instructions  · Take over-the-counter and prescription medicines only as told by your health care provider.  · Do not take baths, swim, or use a hot tub until your health care provider approves. Ask your health care provider if you may take showers. You may only be allowed to take sponge baths.  · Do not drive for 24 hours if you were given a sedative during your procedure.  · Wear a medical alert bracelet in case of an emergency. This will tell any health care providers that you have a port.  · Keep all follow-up visits as told by your health care provider. This is important.  Contact a health care provider if:  · You cannot flush your port with saline as directed, or you cannot draw blood from the port.  · You have a fever or chills.  · You have redness, swelling, or pain around your port insertion site.  · You have fluid or blood coming from your port insertion site.  · Your port insertion site feels warm to the touch.  · You have pus or a bad smell coming from the port   insertion site.  Get help right away if:  · You have chest pain or shortness of breath.  · You have bleeding from your port that you cannot control.  Summary  · Take care of the port as told by your health care provider. Keep the manufacturer's information card with you at all times.  · Change your dressing as told by your health care provider.  · Contact a health care provider if you have a fever or chills or if you have redness, swelling, or pain around your port insertion site.  · Keep all follow-up visits as told by your health care provider.  This information is not intended to replace advice given to you by your health care provider. Make sure you discuss any questions you have with your health care provider.  Document Released: 07/08/2013 Document Revised: 04/15/2018 Document Reviewed:  04/15/2018  Elsevier Interactive Patient Education © 2019 [employer]

## 2018-12-09 ENCOUNTER — Other Ambulatory Visit: Payer: Self-pay | Admitting: Family Medicine

## 2018-12-09 DIAGNOSIS — C9110 Chronic lymphocytic leukemia of B-cell type not having achieved remission: Secondary | ICD-10-CM

## 2019-01-12 ENCOUNTER — Telehealth: Payer: Self-pay

## 2019-01-12 ENCOUNTER — Telehealth: Payer: Self-pay | Admitting: *Deleted

## 2019-01-12 NOTE — Telephone Encounter (Addendum)
Returned call to pt regarding PAC flush appt. Pt states " My PAC is turned and I was told that I have to come in every 6 weeks to get it flushed. I need an appt for this week." Discussed with pt with the Covid -19 virus we are rescheduling all non emergent appointments and pushing appointments out for 2-4 weeks. Pt PAC was flushed on 3/5, next appt for PAC Flush will be 5/6 -8 weeks from last flush. No further concerns.

## 2019-01-12 NOTE — Telephone Encounter (Signed)
I phoned Megan Tate to let her know she would be fine to wait until her May 6 appointment to get her portacath flushed.  She said "I'm going to look into this and possibly take it up with somebody higher up."  I again tried to reassure her and explained we are scheduling non-emergent appointments out a little further than usual,  but she did not seem comfortable with my explanation.

## 2019-01-22 ENCOUNTER — Other Ambulatory Visit: Payer: Self-pay | Admitting: Family Medicine

## 2019-01-22 DIAGNOSIS — C9110 Chronic lymphocytic leukemia of B-cell type not having achieved remission: Secondary | ICD-10-CM

## 2019-01-22 DIAGNOSIS — Z13220 Encounter for screening for lipoid disorders: Secondary | ICD-10-CM

## 2019-01-22 DIAGNOSIS — E119 Type 2 diabetes mellitus without complications: Secondary | ICD-10-CM

## 2019-01-22 DIAGNOSIS — Z794 Long term (current) use of insulin: Principal | ICD-10-CM

## 2019-01-22 NOTE — Telephone Encounter (Signed)
Please fill for 30 days, needs f/u and updated a1c

## 2019-01-22 NOTE — Telephone Encounter (Signed)
Rx refill for QT #30 per Dr. Zigmund Daniel. Pt will be called to schedule labs and OV(doxy.me VV)

## 2019-01-23 ENCOUNTER — Telehealth: Payer: Self-pay | Admitting: Hematology & Oncology

## 2019-01-23 ENCOUNTER — Other Ambulatory Visit: Payer: Medicare Other

## 2019-01-23 NOTE — Telephone Encounter (Signed)
Patient upset that her appointments are pushed back due to COVID-19. I have let Hilario Quarry know to please assist pt.

## 2019-01-24 ENCOUNTER — Other Ambulatory Visit: Payer: Self-pay

## 2019-01-26 ENCOUNTER — Inpatient Hospital Stay: Payer: Medicare Other | Attending: Hematology & Oncology

## 2019-01-26 ENCOUNTER — Ambulatory Visit: Payer: Medicare Other | Admitting: Family Medicine

## 2019-01-26 ENCOUNTER — Other Ambulatory Visit: Payer: Self-pay

## 2019-01-26 VITALS — BP 220/88 | HR 62 | Temp 98.2°F | Resp 18

## 2019-01-26 DIAGNOSIS — Z452 Encounter for adjustment and management of vascular access device: Secondary | ICD-10-CM | POA: Diagnosis not present

## 2019-01-26 DIAGNOSIS — C9110 Chronic lymphocytic leukemia of B-cell type not having achieved remission: Secondary | ICD-10-CM | POA: Insufficient documentation

## 2019-01-26 DIAGNOSIS — Z95828 Presence of other vascular implants and grafts: Secondary | ICD-10-CM

## 2019-01-26 MED ORDER — SODIUM CHLORIDE 0.9% FLUSH
10.0000 mL | INTRAVENOUS | Status: DC | PRN
  Administered 2019-01-26: 15:00:00 10 mL via INTRAVENOUS
  Filled 2019-01-26: qty 10

## 2019-01-26 MED ORDER — HEPARIN SOD (PORK) LOCK FLUSH 100 UNIT/ML IV SOLN
500.0000 [IU] | Freq: Once | INTRAVENOUS | Status: AC
  Administered 2019-01-26: 500 [IU] via INTRAVENOUS
  Filled 2019-01-26: qty 5

## 2019-01-26 NOTE — Patient Instructions (Signed)
Implanted Port Insertion, Care After  This sheet gives you information about how to care for yourself after your procedure. Your health care provider may also give you more specific instructions. If you have problems or questions, contact your health care provider.  What can I expect after the procedure?  After the procedure, it is common to have:  · Discomfort at the port insertion site.  · Bruising on the skin over the port. This should improve over 3-4 days.  Follow these instructions at home:  Port care  · After your port is placed, you will get a manufacturer's information card. The card has information about your port. Keep this card with you at all times.  · Take care of the port as told by your health care provider. Ask your health care provider if you or a family member can get training for taking care of the port at home. A home health care nurse may also take care of the port.  · Make sure to remember what type of port you have.  Incision care         · Follow instructions from your health care provider about how to take care of your port insertion site. Make sure you:  ? Wash your hands with soap and water before and after you change your bandage (dressing). If soap and water are not available, use hand sanitizer.  ? Change your dressing as told by your health care provider.  ? Leave stitches (sutures), skin glue, or adhesive strips in place. These skin closures may need to stay in place for 2 weeks or longer. If adhesive strip edges start to loosen and curl up, you may trim the loose edges. Do not remove adhesive strips completely unless your health care provider tells you to do that.  · Check your port insertion site every day for signs of infection. Check for:  ? Redness, swelling, or pain.  ? Fluid or blood.  ? Warmth.  ? Pus or a bad smell.  Activity  · Return to your normal activities as told by your health care provider. Ask your health care provider what activities are safe for you.  · Do not  lift anything that is heavier than 10 lb (4.5 kg), or the limit that you are told, until your health care provider says that it is safe.  General instructions  · Take over-the-counter and prescription medicines only as told by your health care provider.  · Do not take baths, swim, or use a hot tub until your health care provider approves. Ask your health care provider if you may take showers. You may only be allowed to take sponge baths.  · Do not drive for 24 hours if you were given a sedative during your procedure.  · Wear a medical alert bracelet in case of an emergency. This will tell any health care providers that you have a port.  · Keep all follow-up visits as told by your health care provider. This is important.  Contact a health care provider if:  · You cannot flush your port with saline as directed, or you cannot draw blood from the port.  · You have a fever or chills.  · You have redness, swelling, or pain around your port insertion site.  · You have fluid or blood coming from your port insertion site.  · Your port insertion site feels warm to the touch.  · You have pus or a bad smell coming from the port   insertion site.  Get help right away if:  · You have chest pain or shortness of breath.  · You have bleeding from your port that you cannot control.  Summary  · Take care of the port as told by your health care provider. Keep the manufacturer's information card with you at all times.  · Change your dressing as told by your health care provider.  · Contact a health care provider if you have a fever or chills or if you have redness, swelling, or pain around your port insertion site.  · Keep all follow-up visits as told by your health care provider.  This information is not intended to replace advice given to you by your health care provider. Make sure you discuss any questions you have with your health care provider.  Document Released: 07/08/2013 Document Revised: 04/15/2018 Document Reviewed:  04/15/2018  Elsevier Interactive Patient Education © 2019 [employer]

## 2019-01-29 DIAGNOSIS — H52203 Unspecified astigmatism, bilateral: Secondary | ICD-10-CM | POA: Diagnosis not present

## 2019-01-29 DIAGNOSIS — H04123 Dry eye syndrome of bilateral lacrimal glands: Secondary | ICD-10-CM | POA: Diagnosis not present

## 2019-01-29 DIAGNOSIS — E113293 Type 2 diabetes mellitus with mild nonproliferative diabetic retinopathy without macular edema, bilateral: Secondary | ICD-10-CM | POA: Diagnosis not present

## 2019-01-29 DIAGNOSIS — H524 Presbyopia: Secondary | ICD-10-CM | POA: Diagnosis not present

## 2019-01-29 DIAGNOSIS — H43393 Other vitreous opacities, bilateral: Secondary | ICD-10-CM | POA: Diagnosis not present

## 2019-01-29 DIAGNOSIS — Z794 Long term (current) use of insulin: Secondary | ICD-10-CM | POA: Diagnosis not present

## 2019-01-29 DIAGNOSIS — Z83511 Family history of glaucoma: Secondary | ICD-10-CM | POA: Diagnosis not present

## 2019-01-29 DIAGNOSIS — H40053 Ocular hypertension, bilateral: Secondary | ICD-10-CM | POA: Diagnosis not present

## 2019-02-10 ENCOUNTER — Telehealth: Payer: Self-pay | Admitting: Hematology & Oncology

## 2019-02-10 NOTE — Telephone Encounter (Signed)
Received a call from pt to schedule lab/flush/ov appt with Dr Marin Olp 6/10. Pt stated she need appt because  She has not gotten any lab results since the end of 2019

## 2019-03-11 ENCOUNTER — Inpatient Hospital Stay: Payer: Medicare Other

## 2019-03-11 ENCOUNTER — Other Ambulatory Visit: Payer: Self-pay

## 2019-03-11 ENCOUNTER — Inpatient Hospital Stay: Payer: Medicare Other | Attending: Hematology & Oncology | Admitting: Hematology & Oncology

## 2019-03-11 ENCOUNTER — Encounter: Payer: Self-pay | Admitting: Hematology & Oncology

## 2019-03-11 VITALS — BP 191/80 | HR 69 | Temp 98.6°F | Resp 18

## 2019-03-11 DIAGNOSIS — C9110 Chronic lymphocytic leukemia of B-cell type not having achieved remission: Secondary | ICD-10-CM

## 2019-03-11 DIAGNOSIS — Z794 Long term (current) use of insulin: Secondary | ICD-10-CM | POA: Diagnosis not present

## 2019-03-11 DIAGNOSIS — Z79899 Other long term (current) drug therapy: Secondary | ICD-10-CM | POA: Insufficient documentation

## 2019-03-11 DIAGNOSIS — Z9221 Personal history of antineoplastic chemotherapy: Secondary | ICD-10-CM | POA: Insufficient documentation

## 2019-03-11 DIAGNOSIS — Z95828 Presence of other vascular implants and grafts: Secondary | ICD-10-CM

## 2019-03-11 LAB — CBC WITH DIFFERENTIAL (CANCER CENTER ONLY)
Abs Immature Granulocytes: 0.01 10*3/uL (ref 0.00–0.07)
Basophils Absolute: 0 10*3/uL (ref 0.0–0.1)
Basophils Relative: 0 %
Eosinophils Absolute: 0.2 10*3/uL (ref 0.0–0.5)
Eosinophils Relative: 3 %
HCT: 36.3 % (ref 36.0–46.0)
Hemoglobin: 11.5 g/dL — ABNORMAL LOW (ref 12.0–15.0)
Immature Granulocytes: 0 %
Lymphocytes Relative: 45 %
Lymphs Abs: 3.1 10*3/uL (ref 0.7–4.0)
MCH: 24.5 pg — ABNORMAL LOW (ref 26.0–34.0)
MCHC: 31.7 g/dL (ref 30.0–36.0)
MCV: 77.2 fL — ABNORMAL LOW (ref 80.0–100.0)
Monocytes Absolute: 1 10*3/uL (ref 0.1–1.0)
Monocytes Relative: 14 %
Neutro Abs: 2.7 10*3/uL (ref 1.7–7.7)
Neutrophils Relative %: 38 %
Platelet Count: 208 10*3/uL (ref 150–400)
RBC: 4.7 MIL/uL (ref 3.87–5.11)
RDW: 14.3 % (ref 11.5–15.5)
WBC Count: 7 10*3/uL (ref 4.0–10.5)
nRBC: 0 % (ref 0.0–0.2)

## 2019-03-11 LAB — CMP (CANCER CENTER ONLY)
ALT: 8 U/L (ref 0–44)
AST: 10 U/L — ABNORMAL LOW (ref 15–41)
Albumin: 4.1 g/dL (ref 3.5–5.0)
Alkaline Phosphatase: 88 U/L (ref 38–126)
Anion gap: 8 (ref 5–15)
BUN: 17 mg/dL (ref 8–23)
CO2: 25 mmol/L (ref 22–32)
Calcium: 10.9 mg/dL — ABNORMAL HIGH (ref 8.9–10.3)
Chloride: 102 mmol/L (ref 98–111)
Creatinine: 1.3 mg/dL — ABNORMAL HIGH (ref 0.44–1.00)
GFR, Est AFR Am: 47 mL/min — ABNORMAL LOW (ref >60)
GFR, Estimated: 41 mL/min — ABNORMAL LOW (ref >60)
Glucose, Bld: 278 mg/dL — ABNORMAL HIGH (ref 70–99)
Potassium: 3.7 mmol/L (ref 3.5–5.1)
Sodium: 135 mmol/L (ref 135–145)
Total Bilirubin: 0.6 mg/dL (ref 0.3–1.2)
Total Protein: 6.2 g/dL — ABNORMAL LOW (ref 6.5–8.1)

## 2019-03-11 MED ORDER — SODIUM CHLORIDE 0.9% FLUSH
10.0000 mL | INTRAVENOUS | Status: DC | PRN
  Administered 2019-03-11: 15:00:00 10 mL via INTRAVENOUS
  Filled 2019-03-11: qty 10

## 2019-03-11 MED ORDER — HEPARIN SOD (PORK) LOCK FLUSH 100 UNIT/ML IV SOLN
500.0000 [IU] | Freq: Once | INTRAVENOUS | Status: AC
  Administered 2019-03-11: 500 [IU] via INTRAVENOUS
  Filled 2019-03-11: qty 5

## 2019-03-11 NOTE — Progress Notes (Signed)
Hematology and Oncology Follow Up Visit  Megan Tate [id number] 27-Nov-1945 73 y.o. 03/11/2019   Principle Diagnosis:  Chronic lymphocytic leukemia- Trisomy 12  Past Therapy:             Status post 6 cycles of Gazyva/Bendamustine - completed4/2016 Maintenance Gazyva every 3 months s/p cycle 11 -completed 2 years in June 2018  Current Therapy:   Observation   Interim History:  Megan Tate is here today for follow-up.  She is doing pretty well.  She still bothered by the fact that she cannot do all that much because of the coronavirus.  However, she is being very careful.  She has had no fever.  There is been no cough or shortness of breath.  He has had no swollen lymph nodes.  As always, her blood sugars are quite high.  Her glucose today is 278.  She has had no change in bowel or bladder habits.  There is been no rashes.  She has had no leg swelling.  Overall, her performance status is ECOG 1.     Medications:  Allergies as of 03/11/2019      Reactions   Tylenol [acetaminophen] Other (See Comments)   Messes with stomach      Medication List       Accurate as of March 11, 2019  3:56 PM. If you have any questions, ask your nurse or doctor.        amoxicillin 500 MG capsule Commonly known as:  AMOXIL Take 1 capsule (500 mg total) by mouth 3 (three) times daily.   cloNIDine 0.2 MG tablet Commonly known as:  CATAPRES TAKE 1 TABLET BY MOUTH TWICE A DAY   dorzolamide-timolol 22.3-6.8 MG/ML ophthalmic solution Commonly known as:  COSOPT Place 1 drop into both eyes 2 (two) times daily.   doxepin 10 MG capsule Commonly known as:  SINEQUAN Take 1 capsule by mouth daily.   Ferrous Sulfate 50 MG Tbcr Commonly known as:  Slow Release Iron Take 1 tablet by mouth daily.   fluticasone 50 MCG/ACT nasal spray Commonly known as:  FLONASE Place 2 sprays into both nostrils daily.   folic acid 1 MG tablet Commonly known as:  FOLVITE TAKE 2 TABLETS (2 MG TOTAL) BY MOUTH  DAILY.   glucose blood test strip Commonly known as:  ONE TOUCH ULTRA TEST USE TO TEST BLOOD SUGAR BEFORE MEALS AND AT BEDTIME   Insulin Detemir 100 UNIT/ML Pen Commonly known as:  Levemir FlexTouch Inject 10 Units into the skin every morning. What changed:  how much to take   Insulin Pen Needle 32G X 4 MM Misc Commonly known as:  BD Pen Needle Nano U/F Patient is to check blood sugar daily before meals and bedtime. Dx. E11.9   Janumet XR 50-1000 MG Tb24 Generic drug:  SitaGLIPtin-MetFORMIN HCl TAKE 2 TABLETS BY MOUTH EVERY DAY   lidocaine-prilocaine cream Commonly known as:  EMLA Apply to Port-A-Cath site 1 hour prior to treatment   Linzess 145 MCG Caps capsule Generic drug:  linaclotide Take 1 capsule by mouth daily.   losartan 100 MG tablet Commonly known as:  COZAAR Take 1 tablet (100 mg total) by mouth daily.   meclizine 25 MG tablet Commonly known as:  ANTIVERT Take 1 tablet (25 mg total) by mouth 3 (three) times daily as needed for dizziness.   metoprolol 200 MG 24 hr tablet Commonly known as:  TOPROL-XL Take 1 tablet (200 mg total) by mouth daily.   ONE TOUCH ULTRA  SYSTEM KIT w/Device Kit 1 kit by Does not apply route once. Check blood sugar daily before meals and at bedtime.   rosuvastatin 20 MG tablet Commonly known as:  CRESTOR Take 20 mg by mouth daily.   spironolactone 50 MG tablet Commonly known as:  ALDACTONE Take 1 tablet (50 mg total) by mouth daily.   tiZANidine 2 MG tablet Commonly known as:  ZANAFLEX Take 1 tablet (2 mg total) by mouth every 8 (eight) hours as needed for muscle spasms.   UNABLE TO FIND One Touch Ultra Blood Glucose Monitoring Meter. Dx E11.9       Allergies:  Allergies  Allergen Reactions  . Tylenol [Acetaminophen] Other (See Comments)    Messes with stomach    Past Medical History, Surgical history, Social history, and Family History were reviewed and updated.  Review of Systems: Review of Systems   Constitutional: Negative.   HENT: Negative.   Eyes: Negative.   Respiratory: Negative.   Cardiovascular: Negative.   Gastrointestinal: Negative.   Genitourinary: Negative.   Musculoskeletal: Negative.   Skin: Negative.   Neurological: Negative.   Endo/Heme/Allergies: Negative.   Psychiatric/Behavioral: Negative.      Physical Exam:  oral temperature is 98.6 F (37 C). Her blood pressure is 191/80 (abnormal) and her pulse is 69. Her respiration is 18 and oxygen saturation is 100%.   Wt Readings from Last 3 Encounters:  12/04/18 203 lb (92.1 kg)  10/04/18 195 lb 15.8 oz (88.9 kg)  09/25/18 196 lb (88.9 kg)    Physical Exam Vitals signs reviewed.  HENT:     Head: Normocephalic and atraumatic.  Eyes:     Pupils: Pupils are equal, round, and reactive to light.  Neck:     Musculoskeletal: Normal range of motion.  Cardiovascular:     Rate and Rhythm: Normal rate and regular rhythm.     Heart sounds: Normal heart sounds.  Pulmonary:     Effort: Pulmonary effort is normal.     Breath sounds: Normal breath sounds.  Abdominal:     General: Bowel sounds are normal.     Palpations: Abdomen is soft.  Musculoskeletal: Normal range of motion.        General: No tenderness or deformity.  Lymphadenopathy:     Cervical: No cervical adenopathy.  Skin:    General: Skin is warm and dry.     Findings: No erythema or rash.  Neurological:     Mental Status: She is alert and oriented to person, place, and time.  Psychiatric:        Behavior: Behavior normal.        Thought Content: Thought content normal.        Judgment: Judgment normal.      Lab Results  Component Value Date   WBC 7.0 03/11/2019   HGB 11.5 (L) 03/11/2019   HCT 36.3 03/11/2019   MCV 77.2 (L) 03/11/2019   PLT 208 03/11/2019   Lab Results  Component Value Date   FERRITIN 442 (H) 07/31/2018   IRON 88 07/31/2018   TIBC 253 07/31/2018   UIBC 165 07/31/2018   IRONPCTSAT 35 07/31/2018   Lab Results   Component Value Date   RETICCTPCT 1.1 05/08/2018   RBC 4.70 03/11/2019   RETICCTABS 65.4 04/07/2014   Lab Results  Component Value Date   KAPLAMBRATIO 1.59 03/28/2017   Lab Results  Component Value Date   IGGSERUM 539 (L) 03/28/2017   IGA 41 (L) 03/30/2015   IGMSERUM <5 (L)  03/28/2017   Lab Results  Component Value Date   TOTALPROTELP 5.5 (L) 03/30/2015   ALBUMINELP 3.4 (L) 03/30/2015   A1GS 0.3 03/30/2015   A2GS 0.8 03/30/2015   BETS 0.4 03/30/2015   BETA2SER 0.2 03/30/2015   GAMS 0.5 (L) 03/30/2015   MSPIKE Not Observed 03/28/2017   SPEI * 03/30/2015     Chemistry      Component Value Date/Time   NA 135 03/11/2019 1445   NA 142 09/05/2017 0751   K 3.7 03/11/2019 1445   K 3.8 09/05/2017 0751   CL 102 03/11/2019 1445   CL 104 09/05/2017 0751   CO2 25 03/11/2019 1445   CO2 27 09/05/2017 0751   BUN 17 03/11/2019 1445   BUN 11 09/05/2017 0751   CREATININE 1.30 (H) 03/11/2019 1445   CREATININE 1.1 09/05/2017 0751      Component Value Date/Time   CALCIUM 10.9 (H) 03/11/2019 1445   CALCIUM 10.8 (H) 09/05/2017 0751   ALKPHOS 88 03/11/2019 1445   ALKPHOS 88 (H) 09/05/2017 0751   AST 10 (L) 03/11/2019 1445   ALT 8 03/11/2019 1445   ALT 14 09/05/2017 0751   BILITOT 0.6 03/11/2019 1445      Impression and Plan: Ms. Lucien is a pleasant 73 yo African American female with chronic lymphocytic leukemia. She completed treatment with Gazyva/Bendamustine in April 2016 and maintenance Gazyva in June 2018.   [birth year] today.  Sounds like she will have a nice birthday although probably not going to be too busy because of the coronavirus.  Her white cell count is still normal but it is creeping up slowly.  We will not have to watch out for this.  Her percentage of lymphocytes is also a little bit higher.  I think we can probably get her back after Labor Day now.  She will have a Port-A-Cath flushed in 6 weeks.  She knows that she can was come back to see [place] sooner  if she has any problems.  Volanda Napoleon, MD 6/10/20203:56 PM

## 2019-03-12 LAB — PROTEIN ELECTROPHORESIS, SERUM, WITH REFLEX
A/G Ratio: 1.5 (ref 0.7–1.7)
Albumin ELP: 3.7 g/dL (ref 2.9–4.4)
Alpha-1-Globulin: 0.2 g/dL (ref 0.0–0.4)
Alpha-2-Globulin: 0.9 g/dL (ref 0.4–1.0)
Beta Globulin: 0.8 g/dL (ref 0.7–1.3)
Gamma Globulin: 0.5 g/dL (ref 0.4–1.8)
Globulin, Total: 2.4 g/dL (ref 2.2–3.9)
Total Protein ELP: 6.1 g/dL (ref 6.0–8.5)

## 2019-03-12 LAB — BETA 2 MICROGLOBULIN, SERUM: Beta-2 Microglobulin: 3.3 mg/L — ABNORMAL HIGH (ref 0.6–2.4)

## 2019-03-12 LAB — KAPPA/LAMBDA LIGHT CHAINS
Kappa free light chain: 14.1 mg/L (ref 3.3–19.4)
Kappa, lambda light chain ratio: 2.56 — ABNORMAL HIGH (ref 0.26–1.65)
Lambda free light chains: 5.5 mg/L — ABNORMAL LOW (ref 5.7–26.3)

## 2019-03-12 LAB — IGG, IGA, IGM
IgA: 36 mg/dL — ABNORMAL LOW (ref 64–422)
IgG (Immunoglobin G), Serum: 580 mg/dL — ABNORMAL LOW (ref 586–1602)
IgM (Immunoglobulin M), Srm: 8 mg/dL — ABNORMAL LOW (ref 26–217)

## 2019-03-12 LAB — LACTATE DEHYDROGENASE: LDH: 150 U/L (ref 98–192)

## 2019-03-22 ENCOUNTER — Other Ambulatory Visit: Payer: Self-pay | Admitting: Family Medicine

## 2019-03-22 DIAGNOSIS — C9110 Chronic lymphocytic leukemia of B-cell type not having achieved remission: Secondary | ICD-10-CM

## 2019-04-05 ENCOUNTER — Other Ambulatory Visit: Payer: Self-pay | Admitting: Hematology & Oncology

## 2019-04-05 DIAGNOSIS — Z1231 Encounter for screening mammogram for malignant neoplasm of breast: Secondary | ICD-10-CM

## 2019-04-05 DIAGNOSIS — M25551 Pain in right hip: Secondary | ICD-10-CM

## 2019-04-05 DIAGNOSIS — C9110 Chronic lymphocytic leukemia of B-cell type not having achieved remission: Secondary | ICD-10-CM

## 2019-04-09 ENCOUNTER — Other Ambulatory Visit: Payer: Medicare Other

## 2019-04-09 ENCOUNTER — Ambulatory Visit: Payer: Medicare Other | Admitting: Hematology & Oncology

## 2019-04-22 ENCOUNTER — Other Ambulatory Visit: Payer: Self-pay

## 2019-04-22 ENCOUNTER — Inpatient Hospital Stay: Payer: Medicare Other | Attending: Hematology & Oncology

## 2019-04-22 VITALS — BP 212/68 | HR 59 | Temp 98.0°F | Resp 18

## 2019-04-22 DIAGNOSIS — Z95828 Presence of other vascular implants and grafts: Secondary | ICD-10-CM

## 2019-04-22 DIAGNOSIS — C9110 Chronic lymphocytic leukemia of B-cell type not having achieved remission: Secondary | ICD-10-CM | POA: Diagnosis not present

## 2019-04-22 DIAGNOSIS — Z452 Encounter for adjustment and management of vascular access device: Secondary | ICD-10-CM | POA: Diagnosis not present

## 2019-04-22 MED ORDER — SODIUM CHLORIDE 0.9% FLUSH
10.0000 mL | Freq: Once | INTRAVENOUS | Status: AC
  Administered 2019-04-22: 10 mL via INTRAVENOUS
  Filled 2019-04-22: qty 10

## 2019-04-22 MED ORDER — HEPARIN SOD (PORK) LOCK FLUSH 100 UNIT/ML IV SOLN
500.0000 [IU] | Freq: Once | INTRAVENOUS | Status: AC
  Administered 2019-04-22: 500 [IU] via INTRAVENOUS
  Filled 2019-04-22: qty 5

## 2019-04-22 NOTE — Patient Instructions (Signed)
Tunneled Central Venous Catheter Flushing Guide  It is important to flush your tunneled central venous catheter each time you use it, both before and after you use it. Flushing your catheter will help prevent it from clogging. What are the risks? Risks may include:  Infection.  Air getting into the catheter and bloodstream. Supplies needed:  A clean pair of gloves.  A disinfecting wipe. Use an alcohol wipe, chlorhexidine wipe, or iodine wipe as told by your health care provider.  A 10 mL syringe that has been prefilled with saline solution.  An empty 10 mL syringe, if a substance called heparin was injected into your catheter. How to flush your catheter When you flush your catheter, make sure you follow any specific instructions from your health care provider or the manufacturer. These are general guidelines. Flushing your catheter before use If there is heparin in your catheter: 1. Wash your hands with soap and water. 2. Put on gloves. 3. Scrub the injection cap for a minimum of 15 seconds with a disinfecting wipe. 4. Unclamp the catheter. 5. Attach the empty syringe to the injection cap. 6. Pull the syringe plunger back and withdraw 10 mL of blood. 7. Place the syringe into an appropriate waste container. 8. Scrub the injection cap for 15 seconds with a disinfecting wipe. 9. Attach the prefilled syringe to the injection cap. 10. Flush the catheter by pushing the plunger forward until all the liquid from the syringe is in the catheter. 11. Remove the syringe from the injection cap. 12. Clamp the catheter. If there is no heparin in your catheter: 1. Wash your hands with soap and water. 2. Put on gloves. 3. Scrub the injection cap for 15 seconds with a disinfecting wipe. 4. Unclamp the catheter. 5. Attach the prefilled syringe to the injection cap. 6. Flush the catheter by pushing the plunger forward until 5 mL of the liquid from the syringe is in the catheter. 7. Pull back on  the syringe until you see blood in the catheter. 8. If you have been asked to collect any blood, follow your health care provider's instructions. Otherwise, flush the catheter with the rest of the solution from the syringe. 9. Remove the syringe from the injection cap. 10. Clamp the catheter.  Flushing your catheter after use 1. Wash your hands with soap and water. 2. Put on gloves. 3. Scrub the injection cap for 15 seconds with a disinfecting wipe. 4. Unclamp the catheter. 5. Attach the prefilled syringe to the injection cap. 6. Flush the catheter by pushing the plunger forward until all of the liquid from the syringe is in the catheter. 7. Remove the syringe from the injection cap. 8. Clamp the catheter. Problems and solutions  If blood cannot be completely cleared from the injection cap, you may need to have the injection cap replaced.  If the catheter is difficult to flush, use the pulsing method. The pulsing method involves pushing only a few milliliters of solution into the catheter at a time and pausing between pushes.  If you do not see blood in the catheter when you pull back on the syringe, change your body position, such as by raising your arms above your head. Take a deep breath and cough. Then, pull back on the syringe. If you still do not see blood, flush the catheter with a small amount of solution. Then, change positions again and take a breath or cough. Pull back on the syringe again. If you still do not see   blood, finish flushing the catheter and contact your health care provider. Do not use your catheter until your health care provider says it is okay. General tips  Have someone help you flush your catheter, if possible.  Do not force fluid through your catheter.  Do not use a syringe that is larger or smaller than 10 mL. Using a smaller syringe can make the catheter burst.  Do not use your catheter without flushing it first if it has heparin in it. Contact a health  care provider if:  You cannot see any blood in the catheter when you flush it before using it.  Your catheter is difficult to flush. Get help right away if:  You cannot flush the catheter.  The catheter leaks when you flush it or when there is fluid in it.  There are cracks or breaks in the catheter. Summary  It is important to flush your tunneled central venous catheter each time you use it, both before and after you use it.  Scrub the injection cap for 15 seconds with a disinfecting wipe before and after you flush it.  When you flush your catheter, make sure you follow any specific instructions from your health care provider or the manufacturer.  Get help right away if you cannot flush the catheter. This information is not intended to replace advice given to you by your health care provider. Make sure you discuss any questions you have with your health care provider. Document Released: 09/06/2011 Document Revised: 12/03/2018 Document Reviewed: 12/03/2018 Elsevier Patient Education  2020 [employer]  

## 2019-04-24 ENCOUNTER — Other Ambulatory Visit: Payer: Self-pay | Admitting: Family Medicine

## 2019-04-24 DIAGNOSIS — C9110 Chronic lymphocytic leukemia of B-cell type not having achieved remission: Secondary | ICD-10-CM

## 2019-04-24 NOTE — Telephone Encounter (Signed)
Please advise. Thanks.  

## 2019-04-25 ENCOUNTER — Emergency Department (HOSPITAL_BASED_OUTPATIENT_CLINIC_OR_DEPARTMENT_OTHER): Payer: Medicare Other

## 2019-04-25 ENCOUNTER — Other Ambulatory Visit: Payer: Self-pay

## 2019-04-25 ENCOUNTER — Emergency Department (HOSPITAL_BASED_OUTPATIENT_CLINIC_OR_DEPARTMENT_OTHER)
Admission: EM | Admit: 2019-04-25 | Discharge: 2019-04-25 | Disposition: A | Discharge status: Home / Self Care | Payer: Medicare Other | Attending: Emergency Medicine | Admitting: Emergency Medicine

## 2019-04-25 ENCOUNTER — Encounter (HOSPITAL_BASED_OUTPATIENT_CLINIC_OR_DEPARTMENT_OTHER): Payer: Self-pay | Admitting: *Deleted

## 2019-04-25 DIAGNOSIS — N644 Mastodynia: Secondary | ICD-10-CM | POA: Diagnosis not present

## 2019-04-25 DIAGNOSIS — C9110 Chronic lymphocytic leukemia of B-cell type not having achieved remission: Secondary | ICD-10-CM | POA: Insufficient documentation

## 2019-04-25 DIAGNOSIS — I10 Essential (primary) hypertension: Secondary | ICD-10-CM | POA: Insufficient documentation

## 2019-04-25 DIAGNOSIS — R0789 Other chest pain: Secondary | ICD-10-CM

## 2019-04-25 DIAGNOSIS — Z79899 Other long term (current) drug therapy: Secondary | ICD-10-CM | POA: Insufficient documentation

## 2019-04-25 DIAGNOSIS — E119 Type 2 diabetes mellitus without complications: Secondary | ICD-10-CM | POA: Insufficient documentation

## 2019-04-25 DIAGNOSIS — Z452 Encounter for adjustment and management of vascular access device: Secondary | ICD-10-CM | POA: Diagnosis not present

## 2019-04-25 DIAGNOSIS — Z794 Long term (current) use of insulin: Secondary | ICD-10-CM | POA: Diagnosis not present

## 2019-04-25 LAB — CBC WITH DIFFERENTIAL/PLATELET
Abs Immature Granulocytes: 0.02 10*3/uL (ref 0.00–0.07)
Basophils Absolute: 0 10*3/uL (ref 0.0–0.1)
Basophils Relative: 0 %
Eosinophils Absolute: 0.2 10*3/uL (ref 0.0–0.5)
Eosinophils Relative: 3 %
HCT: 42.5 % (ref 36.0–46.0)
Hemoglobin: 13 g/dL (ref 12.0–15.0)
Immature Granulocytes: 0 %
Lymphocytes Relative: 51 %
Lymphs Abs: 3.8 10*3/uL (ref 0.7–4.0)
MCH: 24 pg — ABNORMAL LOW (ref 26.0–34.0)
MCHC: 30.6 g/dL (ref 30.0–36.0)
MCV: 78.6 fL — ABNORMAL LOW (ref 80.0–100.0)
Monocytes Absolute: 1 10*3/uL (ref 0.1–1.0)
Monocytes Relative: 14 %
Neutro Abs: 2.3 10*3/uL (ref 1.7–7.7)
Neutrophils Relative %: 32 %
Platelets: 200 10*3/uL (ref 150–400)
RBC: 5.41 MIL/uL — ABNORMAL HIGH (ref 3.87–5.11)
RDW: 13.6 % (ref 11.5–15.5)
WBC: 7.4 10*3/uL (ref 4.0–10.5)
nRBC: 0 % (ref 0.0–0.2)

## 2019-04-25 LAB — BASIC METABOLIC PANEL
Anion gap: 11 (ref 5–15)
BUN: 20 mg/dL (ref 8–23)
CO2: 25 mmol/L (ref 22–32)
Calcium: 11.3 mg/dL — ABNORMAL HIGH (ref 8.9–10.3)
Chloride: 104 mmol/L (ref 98–111)
Creatinine, Ser: 1.49 mg/dL — ABNORMAL HIGH (ref 0.44–1.00)
GFR calc Af Amer: 40 mL/min — ABNORMAL LOW (ref >60)
GFR calc non Af Amer: 35 mL/min — ABNORMAL LOW (ref >60)
Glucose, Bld: 187 mg/dL — ABNORMAL HIGH (ref 70–99)
Potassium: 5.1 mmol/L (ref 3.5–5.1)
Sodium: 140 mmol/L (ref 135–145)

## 2019-04-25 MED ORDER — CLONIDINE HCL 0.1 MG PO TABS
0.3000 mg | ORAL_TABLET | Freq: Once | ORAL | Status: AC
  Administered 2019-04-25: 0.3 mg via ORAL
  Filled 2019-04-25: qty 3

## 2019-04-25 NOTE — ED Notes (Signed)
ED Provider at bedside. 

## 2019-04-25 NOTE — ED Notes (Signed)
Attempted IV to LAC x1 unsuccessful, tol well

## 2019-04-25 NOTE — ED Provider Notes (Signed)
[facility] EMERGENCY DEPARTMENT Provider Note   CSN: [id number] Arrival date & time: 04/25/19  1529     History   Chief Complaint Chief Complaint  Patient presents with  . Breast Pain    HPI Megan Tate is a 73 y.o. female history of CLL, diabetes, hypertension here presenting with pain around her port.  Patient states that she had her port accessed about 4 days ago and they tried to flush out the port.  She states that the procedure went well but since then, she noticed some pain and swelling around the port site.  Patient states that the area is very tender to touch.  She initially complained of pain in her right breast but states that it is really around her port site.  Denies any chest pain or trouble breathing.  She states that as far as she knows, her CLL is in remission.  Denies any recent travel or history of heart problems or blood clots.     The history is provided by the patient.    Past Medical History:  Diagnosis Date  . Cancer ([place]) CLL  . CLL (chronic lymphocytic leukemia) ([place]) 08/16/2014  . Diabetes mellitus without complication ([place])   . Hypertension     Patient Active Problem List   Diagnosis Date Noted  . Acute maxillary sinusitis 07/28/2018  . Labyrinthitis 07/28/2018  . Gait instability 07/15/2018  . Seasonal allergic rhinitis 07/15/2018  . IDA (iron deficiency anemia) 05/13/2018  . Benign essential hypertension 02/04/2016  . 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 Type 2 diabetes mellitus without complication, with long-term current use of insulin ([place]) 02/04/2016  . Gout 02/04/2016  . Hypercalcemia 02/04/2016  . Obesity, morbid ([place]) 02/04/2016  . Swollen lymph nodes 02/04/2016  . Vaginal yeast infection 09/20/2014  . CLL (chronic lymphocytic leukemia) ([place]) 08/16/2014    History reviewed. No pertinent surgical history.   OB History   No obstetric history on file.      Home Medications    Prior to Admission medications    Medication Sig Start Date End Date Taking? Authorizing Provider  cloNIDine (CATAPRES) 0.2 MG tablet TAKE 1 TABLET BY MOUTH TWICE A DAY 08/05/18  Yes Luetta Nutting, DO  doxepin (SINEQUAN) 10 MG capsule Take 1 capsule by mouth daily. 09/07/16  Yes [provider]  Ferrous Sulfate (SLOW RELEASE IRON) 50 MG TBCR Take 1 tablet by mouth daily. 09/29/18  Yes Volanda Napoleon, MD  folic acid (FOLVITE) 1 MG tablet TAKE 2 TABLETS (2 MG TOTAL) BY MOUTH DAILY. 10/08/18  Yes Ennever, Rudell Cobb, MD  Insulin Detemir (LEVEMIR FLEXTOUCH) 100 UNIT/ML Pen Inject 10 Units into the skin every morning. Patient taking differently: Inject 15 Units into the skin every morning.  07/15/18  Yes Luetta Nutting, DO  JANUMET XR 50-1000 MG TB24 TAKE 2 TABLETS BY MOUTH EVERY DAY 01/22/19  Yes Luetta Nutting, DO  LINZESS 145 MCG CAPS capsule Take 1 capsule by mouth daily. 09/10/16  Yes [provider]  losartan (COZAAR) 100 MG tablet TAKE 1 TABLET BY MOUTH EVERY DAY 03/23/19  Yes Luetta Nutting, DO  metoprolol (TOPROL-XL) 200 MG 24 hr tablet Take 1 tablet (200 mg total) by mouth daily. 07/15/18  Yes Luetta Nutting, DO  rosuvastatin (CRESTOR) 20 MG tablet Take 20 mg by mouth daily.   Yes [provider]  amoxicillin (AMOXIL) 500 MG capsule Take 1 capsule (500 mg total) by mouth 3 (three) times daily. 09/10/18   Luetta Nutting, DO  Blood Glucose Monitoring Suppl (ONE TOUCH ULTRA SYSTEM KIT) W/DEVICE KIT 1 kit by Does not apply route once. Check blood sugar daily before meals and at bedtime. 11/19/14   Volanda Napoleon, MD  dorzolamide-timolol (COSOPT) 22.3-6.8 MG/ML ophthalmic solution Place 1 drop into both eyes 2 (two) times daily.  03/18/14   [provider]  fluticasone (FLONASE) 50 MCG/ACT nasal spray Place 2 sprays into both nostrils daily. 06/03/18   Jacqlyn Larsen, PA-C  glucose blood (ONE TOUCH ULTRA TEST) test strip USE TO TEST BLOOD SUGAR BEFORE MEALS AND AT BEDTIME 09/30/17   Ennever, Rudell Cobb, MD   Insulin Pen Needle (BD PEN NEEDLE NANO U/F) 32G X 4 MM MISC Patient is to check blood sugar daily before meals and bedtime. Dx. E11.9 07/23/18   Luetta Nutting, DO  lidocaine-prilocaine (EMLA) cream Apply to Port-A-Cath site 1 hour prior to treatment 09/15/18   Volanda Napoleon, MD  meclizine (ANTIVERT) 25 MG tablet Take 1 tablet (25 mg total) by mouth 3 (three) times daily as needed for dizziness. 07/28/18   Libby Maw, MD  spironolactone (ALDACTONE) 50 MG tablet TAKE 1 TABLET BY MOUTH EVERY DAY 04/06/19   Volanda Napoleon, MD  tiZANidine (ZANAFLEX) 2 MG tablet Take 1 tablet (2 mg total) by mouth every 8 (eight) hours as needed for muscle spasms. 11/18/17   Volanda Napoleon, MD  UNABLE TO FIND One Touch Ultra Blood Glucose Monitoring Meter. Dx E11.9 10/02/17   Volanda Napoleon, MD    Family History No family history on file.  Social History Social History   Tobacco Use  . Smoking status: Never Smoker  . Smokeless tobacco: Never Used  . Tobacco comment: never used tobacco  Substance Use Topics  . Alcohol use: Never    Alcohol/week: 0.0 standard drinks    Frequency: Never  . Drug use: Never     Allergies   Tylenol [acetaminophen]   Review of Systems Review of Systems  Respiratory:       Pain around port site   All other systems reviewed and are negative.    Physical Exam Updated Vital Signs BP (!) 175/93   Pulse (!) 54   Temp 98.4 F (36.9 C) (Oral)   Resp 18   Ht '5\' 4"'  (1.626 m)   Wt 89.4 kg   SpO2 99%   BMI 33.81 kg/m   Physical Exam Vitals signs and nursing note reviewed.  Constitutional:      Appearance: Normal appearance.  HENT:     Head: Normocephalic.     Nose: Nose normal.     Mouth/Throat:     Mouth: Mucous membranes are moist.  Eyes:     Extraocular Movements: Extraocular movements intact.     Pupils: Pupils are equal, round, and reactive to light.  Neck:     Musculoskeletal: Normal range of motion.  Cardiovascular:     Rate and  Rhythm: Normal rate and regular rhythm.     Pulses: Normal pulses.     Heart sounds: Normal heart sounds.  Pulmonary:     Effort: Pulmonary effort is normal.     Breath sounds: Normal breath sounds.     Comments: Port easily palpable in the R chest. ? Small hematoma around it. No obvious breast mass on exam  Abdominal:     General: Abdomen is flat.     Palpations: Abdomen is soft.  Musculoskeletal: Normal range of motion.  Skin:    General: Skin is  warm.     Capillary Refill: Capillary refill takes less than 2 seconds.  Neurological:     General: No focal deficit present.     Mental Status: She is alert and oriented to person, place, and time.  Psychiatric:        Mood and Affect: Mood normal.        Behavior: Behavior normal.      ED Treatments / Results  Labs (all labs ordered are listed, but only abnormal results are displayed) Labs Reviewed  CBC WITH DIFFERENTIAL/PLATELET  BASIC METABOLIC PANEL    EKG None  Radiology No results found.  Procedures Procedures (including critical care time)  Medications Ordered in ED Medications - No data to display   Initial Impression / Assessment and Plan / ED Course  I have reviewed the triage vital signs and the nursing notes.  Pertinent labs & imaging results that were available during my care of the patient were reviewed by me and considered in my medical decision making (see chart for details).       Megan Tate is a 73 y.o. female here presenting with pain around the port site.  Patient's port was accessed recently and likely had a heparin flush.  I suspect a small hematoma around the port site.  Also consider recurrent cancer with a mass versus a primary breast mass.  I offered a CT chest with contrast and blood work but patient wants to avoid CT scans for now.  We will get a chest x-ray and blood work.  She has oncology follow-up and I told her if the swelling does not improve she may need a CT or mammogram   6:24  PM CBC stable. Her CXR is clear and port seems to be in place.  I think likely small hematoma and I recommend that she keeps an eye on it and apply warm compresses. If it does not resolve in a week she may need CT chest or have breast imaging.  Of note her blood pressure was elevated around 200 but she did not take her evening dose of clonidine and she was given a dose of clonidine prior to discharge.  Final Clinical Impressions(s) / ED Diagnoses   Final diagnoses:  None    ED Discharge Orders    None       Drenda Freeze, MD 04/25/19 1825

## 2019-04-25 NOTE — ED Notes (Signed)
Patient transported to X-ray 

## 2019-04-25 NOTE — ED Triage Notes (Signed)
Pt reports pain in her right breast x 3 days. Denies injury

## 2019-04-25 NOTE — Discharge Instructions (Signed)
You likely have a hematoma around the port. Please apply warm compresses and observe it   If there is worse swelling or pain in a week, then follow up with Dr. Marin Olp. You may need CT chest or breast imaging at that time   Return to ER if you have chest pain, trouble breathing, fever

## 2019-04-28 ENCOUNTER — Other Ambulatory Visit: Payer: Self-pay | Admitting: Family Medicine

## 2019-04-28 DIAGNOSIS — C9110 Chronic lymphocytic leukemia of B-cell type not having achieved remission: Secondary | ICD-10-CM

## 2019-04-28 NOTE — Telephone Encounter (Signed)
Please advise. Lab orders are in future , recent ED visit for HTN/chest pain.

## 2019-05-03 ENCOUNTER — Other Ambulatory Visit: Payer: Self-pay | Admitting: Family Medicine

## 2019-05-05 NOTE — Telephone Encounter (Signed)
Ok to fill x1 month, please schedule f/u

## 2019-05-05 NOTE — Telephone Encounter (Signed)
Dr. Zigmund Daniel please advise okay to send in? Last OV 07/15/2018. Pt was suppose to come in for labs to complete A1C in 12/2018

## 2019-05-06 NOTE — Telephone Encounter (Signed)
Megan Tate can you please call pt to schedule. 1 mo refill has been sent. Pt needs OV to fill more. Thanks.

## 2019-05-06 NOTE — Telephone Encounter (Signed)
Called patient and left vm to call back to schedule appointment. Will try again.

## 2019-05-12 ENCOUNTER — Ambulatory Visit: Payer: Medicare Other | Admitting: Family Medicine

## 2019-05-15 ENCOUNTER — Telehealth: Payer: Self-pay | Admitting: Family Medicine

## 2019-05-15 NOTE — Telephone Encounter (Signed)
Patient is calling to review screening question prior to her appt on Monday 05/18/2019

## 2019-05-18 ENCOUNTER — Encounter: Payer: Self-pay | Admitting: Family Medicine

## 2019-05-18 ENCOUNTER — Ambulatory Visit (INDEPENDENT_AMBULATORY_CARE_PROVIDER_SITE_OTHER): Payer: Medicare Other | Admitting: Family Medicine

## 2019-05-18 ENCOUNTER — Other Ambulatory Visit: Payer: Self-pay

## 2019-05-18 VITALS — BP 142/70 | HR 43 | Temp 97.1°F | Ht 64.0 in | Wt 216.6 lb

## 2019-05-18 DIAGNOSIS — Z13220 Encounter for screening for lipoid disorders: Secondary | ICD-10-CM

## 2019-05-18 DIAGNOSIS — I10 Essential (primary) hypertension: Secondary | ICD-10-CM

## 2019-05-18 DIAGNOSIS — Z794 Long term (current) use of insulin: Secondary | ICD-10-CM

## 2019-05-18 DIAGNOSIS — Z780 Asymptomatic menopausal state: Secondary | ICD-10-CM | POA: Diagnosis not present

## 2019-05-18 DIAGNOSIS — E119 Type 2 diabetes mellitus without complications: Secondary | ICD-10-CM | POA: Diagnosis not present

## 2019-05-18 DIAGNOSIS — C9110 Chronic lymphocytic leukemia of B-cell type not having achieved remission: Secondary | ICD-10-CM | POA: Diagnosis not present

## 2019-05-18 LAB — LIPID PANEL
Cholesterol: 101 mg/dL (ref 0–200)
HDL: 36 mg/dL — ABNORMAL LOW (ref >39.00)
LDL Cholesterol: 49 mg/dL (ref 0–99)
NonHDL: 65.25
Total CHOL/HDL Ratio: 3
Triglycerides: 80 mg/dL (ref 0.0–149.0)
VLDL: 16 mg/dL (ref 0.0–40.0)

## 2019-05-18 LAB — HEMOGLOBIN A1C: Hgb A1c MFr Bld: 10.6 % — ABNORMAL HIGH (ref 4.6–6.5)

## 2019-05-18 MED ORDER — JANUMET XR 50-1000 MG PO TB24: 2.0000 | ORAL_TABLET | Freq: Every day | ORAL | 1 refills | Status: DC

## 2019-05-18 NOTE — Assessment & Plan Note (Signed)
-  Discussed checking blood sugars at home -Recommend low carb diet and regular exercise.  -Update a1c and lipid panel today.  -Janumet renewed.  -Discussed pneumonia vaccine however she declines at this time.

## 2019-05-18 NOTE — Assessment & Plan Note (Signed)
-  BP mildly elevated, recently restarted clonidine.  -She will continue to monitor regularly at home.

## 2019-05-18 NOTE — Patient Instructions (Signed)

## 2019-05-18 NOTE — Progress Notes (Signed)
Megan Tate - 73 y.o. female MRN [id number]  Date of birth: April 21, 1946  Subjective Chief Complaint  Patient presents with  . Medication Refill    janumet    HPI Megan Tate is a 73 y.o. female with history of HTN, T2DM, and CLL here today for follow up of HTN and DM.    -HTN:  Current management with clonidine, losartan, metoprolol and spironolactone.  Reports she is doing well with current medications.  She had missed a couple of doses of clonidine and BP was elevated however improving now that she is taking consistently again.  She denies anginal symptoms, headache, vision changes or palpitations.   -T2DM:  Current treatment with levemir 15 units daily as well as janumet 50/1000mg .  She admits to not being very good about checking her blood sugars.  Reports when she has checked they have been just below 200.  She denies any symptoms of hypoglycemia.  She does not follow a low carb diet and has not been very active over the past few months.   Her weight is up 13 lbs since March.  She has never had pneumonia vaccine. She is up to date on eye exam with Dr. Trinna Post.   -CLL:  Current treatment is observation.  She is followed by Dr. Marin Olp.   ROS:  A comprehensive ROS was completed and negative except as noted per HPI  Allergies  Allergen Reactions  . Tylenol [Acetaminophen] Other (See Comments)    Messes with stomach    Past Medical History:  Diagnosis Date  . Cancer ([place]) CLL  . CLL (chronic lymphocytic leukemia) ([place]) 08/16/2014  . Diabetes mellitus without complication ([place])   . Hypertension     No past surgical history on file.  Social History   Socioeconomic History  . Marital status: Single    Spouse name: Not on file  . Number of children: Not on file  . Years of education: Not on file  . Highest education level: Not on file  Occupational History  . Not on file  Social Needs  . Financial resource strain: Not on file  . Food insecurity    Worry: Not on file   Inability: Not on file  . Transportation needs    Medical: Not on file    Non-medical: Not on file  Tobacco Use  . Smoking status: Never Smoker  . Smokeless tobacco: Never Used  . Tobacco comment: never used tobacco  Substance and Sexual Activity  . Alcohol use: Never    Alcohol/week: 0.0 standard drinks    Frequency: Never  . Drug use: Never  . Sexual activity: Not on file  Lifestyle  . Physical activity    Days per week: Not on file    Minutes per session: Not on file  . Stress: Not on file  Relationships  . Social Herbalist on phone: Not on file    Gets together: Not on file    Attends religious service: Not on file    Active member of club or organization: Not on file    Attends meetings of clubs or organizations: Not on file    Relationship status: Not on file  Other Topics Concern  . Not on file  Social History Narrative  . Not on file    No family history on file.  Health Maintenance  Topic Date Due  . FOOT EXAM  03/10/1957  . OPHTHALMOLOGY EXAM  03/10/1957  . TETANUS/TDAP  03/10/1966  . COLONOSCOPY  03/10/1997  . DEXA SCAN  03/10/2012  . PNA vac Low Risk Adult (1 of 2 - PCV13) 03/10/2012  . HEMOGLOBIN A1C  01/14/2019  . INFLUENZA VACCINE  05/02/2019  . MAMMOGRAM  08/05/2020  . Hepatitis C Screening  Completed    ----------------------------------------------------------------------------------------------------------------------------------------------------------------------------------------------------------------- Physical Exam BP (!) 142/70   Pulse (!) 43   Temp (!) 97.1 F (36.2 C) (Temporal)   Ht 5\' 4"  (1.626 m)   Wt 216 lb 9.6 oz (98.2 kg)   SpO2 100%   BMI 37.18 kg/m   Physical Exam Constitutional:      Appearance: Normal appearance.  HENT:     Head: Normocephalic and atraumatic.     Mouth/Throat:     Mouth: Mucous membranes are moist.  Eyes:     General: No scleral icterus. Neck:     Musculoskeletal: Neck supple.   Cardiovascular:     Rate and Rhythm: Normal rate and regular rhythm.  Pulmonary:     Effort: Pulmonary effort is normal.     Breath sounds: Normal breath sounds.  Musculoskeletal:     Right lower leg: No edema.     Left lower leg: No edema.  Skin:    General: Skin is warm and dry.  Neurological:     General: No focal deficit present.     Mental Status: She is alert.  Psychiatric:        Mood and Affect: Mood normal.        Behavior: Behavior normal.    Diabetic Foot Exam - Simple   Simple Foot Form Visual Inspection No deformities, no ulcerations, no other skin breakdown bilaterally: Yes Sensation Testing Intact to touch and monofilament testing bilaterally: Yes Pulse Check Posterior Tibialis and Dorsalis pulse intact bilaterally: Yes Comments      ------------------------------------------------------------------------------------------------------------------------------------------------------------------------------------------------------------------- Assessment and Plan  Type 2 diabetes mellitus without complication, with long-term current use of insulin ([facility]) -Discussed checking blood sugars at home -Recommend low carb diet and regular exercise.  -Update a1c and lipid panel today.  -Janumet renewed.  -Discussed pneumonia vaccine however she declines at this time.   Benign essential hypertension -BP mildly elevated, recently restarted clonidine.  -She will continue to monitor regularly at home.   CLL (chronic lymphocytic leukemia) Stable, followed by oncology.    Post-menopausal DEXA ordered.

## 2019-05-18 NOTE — Assessment & Plan Note (Signed)
Stable, followed by oncology

## 2019-05-18 NOTE — Assessment & Plan Note (Signed)
DEXA ordered.  

## 2019-06-03 ENCOUNTER — Ambulatory Visit (INDEPENDENT_AMBULATORY_CARE_PROVIDER_SITE_OTHER): Payer: Medicare Other | Admitting: Family Medicine

## 2019-06-03 ENCOUNTER — Other Ambulatory Visit: Payer: Self-pay

## 2019-06-03 ENCOUNTER — Encounter: Payer: Self-pay | Admitting: Family Medicine

## 2019-06-03 VITALS — BP 130/80 | HR 61 | Ht 64.0 in | Wt 216.0 lb

## 2019-06-03 DIAGNOSIS — R300 Dysuria: Secondary | ICD-10-CM | POA: Diagnosis not present

## 2019-06-03 LAB — POCT URINALYSIS DIPSTICK
Bilirubin, UA: POSITIVE
Blood, UA: NEGATIVE
Glucose, UA: NEGATIVE
Ketones, UA: NEGATIVE
Nitrite, UA: POSITIVE
Protein, UA: POSITIVE — AB
Spec Grav, UA: 1.015 (ref 1.010–1.025)
Urobilinogen, UA: 1 E.U./dL
pH, UA: 5 (ref 5.0–8.0)

## 2019-06-03 MED ORDER — CEPHALEXIN 500 MG PO CAPS: 500.0000 mg | ORAL_CAPSULE | Freq: Two times a day (BID) | ORAL | 0 refills | Status: AC

## 2019-06-03 NOTE — Progress Notes (Addendum)
Megan Tate - 73 y.o. female MRN [id number]  Date of birth: 1946-07-27  Subjective Chief Complaint  Patient presents with  . Urinary Tract Infection    HPI Megan Tate is a 73 y.o. female here today with complaint of urinary frequency and urgency.  She has mild dysruia.  Her symptoms started about 2-3 days ago.  She reports this feels similar to UTI she has had in the past.  She denies blood in her urine, back/flank pain, vaginal bleeding or discharge, fever, chills or nausea.  She is taking azo to help with discomfort.  She has not been checking her blood sugars but did increase her levemir recently as directed.   ROS:  A comprehensive ROS was completed and negative except as noted per HPI  Allergies  Allergen Reactions  . Tylenol [Acetaminophen] Other (See Comments)    Messes with stomach    Past Medical History:  Diagnosis Date  . Cancer ([place]) CLL  . CLL (chronic lymphocytic leukemia) ([place]) 08/16/2014  . Diabetes mellitus without complication ([place])   . Hypertension     History reviewed. No pertinent surgical history.  Social History   Socioeconomic History  . Marital status: Single    Spouse name: Not on file  . Number of children: Not on file  . Years of education: Not on file  . Highest education level: Not on file  Occupational History  . Not on file  Social Needs  . Financial resource strain: Not on file  . Food insecurity    Worry: Not on file    Inability: Not on file  . Transportation needs    Medical: Not on file    Non-medical: Not on file  Tobacco Use  . Smoking status: Never Smoker  . Smokeless tobacco: Never Used  . Tobacco comment: never used tobacco  Substance and Sexual Activity  . Alcohol use: Never    Alcohol/week: 0.0 standard drinks    Frequency: Never  . Drug use: Never  . Sexual activity: Not on file  Lifestyle  . Physical activity    Days per week: Not on file    Minutes per session: Not on file  . Stress: Not on file   Relationships  . Social Herbalist on phone: Not on file    Gets together: Not on file    Attends religious service: Not on file    Active member of club or organization: Not on file    Attends meetings of clubs or organizations: Not on file    Relationship status: Not on file  Other Topics Concern  . Not on file  Social History Narrative  . Not on file    History reviewed. No pertinent family history.  Health Maintenance  Topic Date Due  . FOOT EXAM  03/10/1957  . TETANUS/TDAP  03/10/1966  . COLONOSCOPY  03/10/1997  . DEXA SCAN  03/10/2012  . PNA vac Low Risk Adult (1 of 2 - PCV13) 03/10/2012  . INFLUENZA VACCINE  05/02/2019  . HEMOGLOBIN A1C  11/18/2019  . OPHTHALMOLOGY EXAM  04/13/2020  . MAMMOGRAM  08/05/2020  . Hepatitis C Screening  Completed    ----------------------------------------------------------------------------------------------------------------------------------------------------------------------------------------------------------------- Physical Exam BP 130/80   Pulse 61   Ht 5\' 4"  (1.626 m)   Wt 216 lb (98 kg)   SpO2 95%   BMI 37.08 kg/m   Physical Exam HENT:     Head: Normocephalic and atraumatic.  Neck:     Musculoskeletal: Neck supple.  Cardiovascular:     Rate and Rhythm: Normal rate.  Pulmonary:     Effort: Pulmonary effort is normal.     Breath sounds: Normal breath sounds.  Abdominal:     General: Abdomen is flat.     Palpations: Abdomen is soft.     Tenderness: There is no abdominal tenderness. There is no right CVA tenderness or left CVA tenderness.  Skin:    General: Skin is warm and dry.     Findings: No rash.  Neurological:     General: No focal deficit present.     Mental Status: She is alert and oriented to person, place, and time.  Psychiatric:        Mood and Affect: Mood normal.        Behavior: Behavior normal.      ------------------------------------------------------------------------------------------------------------------------------------------------------------------------------------------------------------------- Assessment and Plan  Dysuria -She is unable to provide urine sample in office today.  -Given collection cup and instructions to collect at home and return to [place].  Discussed she should bring this in asap after collection.    -Addendum:  UA is consistent with UTI.  Start cephalexin 500mg  BID x7 days.  Urine sent for culture as well.

## 2019-06-03 NOTE — Assessment & Plan Note (Addendum)
-  She is unable to provide urine sample in office today.  -Given collection cup and instructions to collect at home and return to [place].  Discussed she should bring this in asap after collection.    -Addendum:  UA is consistent with UTI.  Start cephalexin 500mg  BID x7 days.  Urine sent for culture as well.

## 2019-06-03 NOTE — Progress Notes (Signed)
Pt is aware that Rx was sent in.

## 2019-06-05 LAB — URINE CULTURE
MICRO NUMBER:: 839694
SPECIMEN QUALITY:: ADEQUATE

## 2019-06-10 ENCOUNTER — Other Ambulatory Visit: Payer: Self-pay

## 2019-06-10 ENCOUNTER — Inpatient Hospital Stay: Payer: Medicare Other | Attending: Hematology & Oncology

## 2019-06-10 ENCOUNTER — Encounter: Payer: Self-pay | Admitting: *Deleted

## 2019-06-10 ENCOUNTER — Inpatient Hospital Stay (HOSPITAL_BASED_OUTPATIENT_CLINIC_OR_DEPARTMENT_OTHER): Payer: Medicare Other | Admitting: Hematology & Oncology

## 2019-06-10 ENCOUNTER — Encounter: Payer: Self-pay | Admitting: Hematology & Oncology

## 2019-06-10 ENCOUNTER — Inpatient Hospital Stay: Payer: Medicare Other

## 2019-06-10 VITALS — BP 181/75 | HR 48 | Temp 98.3°F | Resp 18 | Ht 64.0 in | Wt 217.0 lb

## 2019-06-10 DIAGNOSIS — C9110 Chronic lymphocytic leukemia of B-cell type not having achieved remission: Secondary | ICD-10-CM | POA: Diagnosis not present

## 2019-06-10 DIAGNOSIS — Z794 Long term (current) use of insulin: Secondary | ICD-10-CM | POA: Diagnosis not present

## 2019-06-10 DIAGNOSIS — M129 Arthropathy, unspecified: Secondary | ICD-10-CM | POA: Insufficient documentation

## 2019-06-10 DIAGNOSIS — Z79899 Other long term (current) drug therapy: Secondary | ICD-10-CM | POA: Diagnosis not present

## 2019-06-10 NOTE — Progress Notes (Signed)
Hematology and Oncology Follow Up Visit  Megan Tate [id number] 01-10-1946 73 y.o. 06/10/2019   Principle Diagnosis:  Chronic lymphocytic leukemia- Trisomy 12  Past Therapy:             Status post 6 cycles of Gazyva/Bendamustine - completed4/2016 Maintenance Gazyva every 3 months s/p cycle 11 -completed 2 years in June 2018  Current Therapy:   Observation   Interim History:  Megan Tate is here today for follow-up.  She is not feeling all that well.  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  She sees an orthopedist for this.  It sounds like she may ultimately need to have surgery for this.  She did not want a Port-A-Cath access today.  She did not want to have any lab work done today.  She just did not feel up to having invasive procedures done.  I can understand this.  She also is little bit irritated that her husband cannot be with her.  I told her that unfortunately, the [facility] just is not allowing visitors as of yet as they want to minimize any risk of the coronavirus from being introduced to 1 of their offices.  Hopefully, this policy will improve by the end of the year and family members will be able to be with their loved 1.  She has had no fever.  I am not sure how well her diabetes is controlled.  Overall, her performance status is ECOG 1.     Medications:  Allergies as of 06/10/2019      Reactions   Tylenol [acetaminophen] Other (See Comments)   Messes with stomach      Medication List       Accurate as of June 10, 2019  4:05 PM. If you have any questions, ask your nurse or doctor.        cephALEXin 500 MG capsule Commonly known as: KEFLEX Take 1 capsule (500 mg total) by mouth 2 (two) times daily for 7 days.   cloNIDine 0.2 MG tablet Commonly known as: CATAPRES TAKE 1 TABLET BY MOUTH TWICE A DAY   dorzolamide-timolol 22.3-6.8 MG/ML ophthalmic solution Commonly known as: COSOPT Place 1 drop into both eyes 2 (two) times  daily.   doxepin 10 MG capsule Commonly known as: SINEQUAN Take 1 capsule by mouth daily.   Ferrous Sulfate 50 MG Tbcr Commonly known as: Slow Release Iron Take 1 tablet by mouth daily.   fluticasone 50 MCG/ACT nasal spray Commonly known as: FLONASE Place 2 sprays into both nostrils daily.   folic acid 1 MG tablet Commonly known as: FOLVITE TAKE 2 TABLETS (2 MG TOTAL) BY MOUTH DAILY.   glucose blood test strip Commonly known as: ONE TOUCH ULTRA TEST USE TO TEST BLOOD SUGAR BEFORE MEALS AND AT BEDTIME   Insulin Pen Needle 32G X 4 MM Misc Commonly known as: BD Pen Needle Nano U/F Patient is to check blood sugar daily before meals and bedtime. Dx. E11.9   Janumet XR 50-1000 MG Tb24 Generic drug: SitaGLIPtin-MetFORMIN HCl Take 2 tablets by mouth daily.   Levemir FlexTouch 100 UNIT/ML Pen Generic drug: Insulin Detemir Inject 15 Units into the skin daily.   lidocaine-prilocaine cream Commonly known as: EMLA Apply to Port-A-Cath site 1 hour prior to treatment   Linzess 145 MCG Caps capsule Generic drug: linaclotide Take 1 capsule by mouth daily.   losartan 100 MG tablet Commonly known as: COZAAR TAKE 1 TABLET BY MOUTH EVERY DAY   meclizine  25 MG tablet Commonly known as: ANTIVERT Take 1 tablet (25 mg total) by mouth 3 (three) times daily as needed for dizziness.   metoprolol 200 MG 24 hr tablet Commonly known as: TOPROL-XL TAKE 1 TABLET BY MOUTH EVERY DAY   ONE TOUCH ULTRA SYSTEM KIT w/Device Kit 1 kit by Does not apply route once. Check blood sugar daily before meals and at bedtime.   rosuvastatin 20 MG tablet Commonly known as: CRESTOR Take 20 mg by mouth daily.   spironolactone 50 MG tablet Commonly known as: ALDACTONE TAKE 1 TABLET BY MOUTH EVERY DAY   tiZANidine 2 MG tablet Commonly known as: ZANAFLEX Take 1 tablet (2 mg total) by mouth every 8 (eight) hours as needed for muscle spasms.   UNABLE TO FIND One Touch Ultra Blood Glucose Monitoring  Meter. Dx E11.9       Allergies:  Allergies  Allergen Reactions  . Tylenol [Acetaminophen] Other (See Comments)    Messes with stomach    Past Medical History, Surgical history, Social history, and Family History were reviewed and updated.  Review of Systems: Review of Systems  Constitutional: Negative.   HENT: Negative.   Eyes: Negative.   Respiratory: Negative.   Cardiovascular: Negative.   Gastrointestinal: Negative.   Genitourinary: Negative.   Musculoskeletal: Negative.   Skin: Negative.   Neurological: Negative.   Endo/Heme/Allergies: Negative.   Psychiatric/Behavioral: Negative.      Physical Exam:  height is '5\' 4"'  (1.626 m) and weight is 217 lb (98.4 kg). Her oral temperature is 98.3 F (36.8 C). Her blood pressure is 181/75 (abnormal) and her pulse is 48 (abnormal). Her respiration is 18 and oxygen saturation is 100%.   Wt Readings from Last 3 Encounters:  06/10/19 217 lb (98.4 kg)  06/03/19 216 lb (98 kg)  05/18/19 216 lb 9.6 oz (98.2 kg)    Physical Exam Vitals signs reviewed.  HENT:     Head: Normocephalic and atraumatic.  Eyes:     Pupils: Pupils are equal, round, and reactive to light.  Neck:     Musculoskeletal: Normal range of motion.  Cardiovascular:     Rate and Rhythm: Normal rate and regular rhythm.     Heart sounds: Normal heart sounds.  Pulmonary:     Effort: Pulmonary effort is normal.     Breath sounds: Normal breath sounds.  Abdominal:     General: Bowel sounds are normal.     Palpations: Abdomen is soft.  Musculoskeletal: Normal range of motion.        General: No tenderness or deformity.  Lymphadenopathy:     Cervical: No cervical adenopathy.  Skin:    General: Skin is warm and dry.     Findings: No erythema or rash.  Neurological:     Mental Status: She is alert and oriented to person, place, and time.  Psychiatric:        Behavior: Behavior normal.        Thought Content: Thought content normal.        Judgment:  Judgment normal.      Lab Results  Component Value Date   WBC 7.4 04/25/2019   HGB 13.0 04/25/2019   HCT 42.5 04/25/2019   MCV 78.6 (L) 04/25/2019   PLT 200 04/25/2019   Lab Results  Component Value Date   FERRITIN 442 (H) 07/31/2018   IRON 88 07/31/2018   TIBC 253 07/31/2018   UIBC 165 07/31/2018   IRONPCTSAT 35 07/31/2018   Lab Results  Component Value Date   RETICCTPCT 1.1 05/08/2018   RBC 5.41 (H) 04/25/2019   RETICCTABS 65.4 04/07/2014   Lab Results  Component Value Date   KPAFRELGTCHN 14.1 03/11/2019   LAMBDASER 5.5 (L) 03/11/2019   KAPLAMBRATIO 2.56 (H) 03/11/2019   Lab Results  Component Value Date   IGGSERUM 580 (L) 03/11/2019   IGA 36 (L) 03/11/2019   IGMSERUM 8 (L) 03/11/2019   Lab Results  Component Value Date   TOTALPROTELP 6.1 03/11/2019   ALBUMINELP 3.7 03/11/2019   A1GS 0.2 03/11/2019   A2GS 0.9 03/11/2019   BETS 0.8 03/11/2019   BETA2SER 0.2 03/30/2015   GAMS 0.5 03/11/2019   MSPIKE Not Observed 03/11/2019   SPEI * 03/30/2015     Chemistry      Component Value Date/Time   NA 140 04/25/2019 1628   NA 142 09/05/2017 0751   K 5.1 04/25/2019 1628   K 3.8 09/05/2017 0751   CL 104 04/25/2019 1628   CL 104 09/05/2017 0751   CO2 25 04/25/2019 1628   CO2 27 09/05/2017 0751   BUN 20 04/25/2019 1628   BUN 11 09/05/2017 0751   CREATININE 1.49 (H) 04/25/2019 1628   CREATININE 1.30 (H) 03/11/2019 1445   CREATININE 1.1 09/05/2017 0751      Component Value Date/Time   CALCIUM 11.3 (H) 04/25/2019 1628   CALCIUM 10.8 (H) 09/05/2017 0751   ALKPHOS 88 03/11/2019 1445   ALKPHOS 88 (H) 09/05/2017 0751   AST 10 (L) 03/11/2019 1445   ALT 8 03/11/2019 1445   ALT 14 09/05/2017 0751   BILITOT 0.6 03/11/2019 1445      Impression and Plan: Megan Tate is a pleasant 73 yo African American female with chronic lymphocytic leukemia. She completed treatment with Gazyva/Bendamustine in April 2016 and maintenance Gazyva in June 2018.   We will try to make  things as easy as possible for Megan Tate.  Again, I just do not want her to be aggravated by having something done that she does not feel necessary.  We will plan to get her back in another couple months.  Maybe, she will let us flush the Port-A-Cath then and also have the lab work done.  We will pray for her.  We had a very good prayer session at the end of our meeting today.  Volanda Napoleon, MD 9/9/20204:05 PM

## 2019-06-10 NOTE — Progress Notes (Signed)
Patient arrived for office visit and was unhappy that the screener would not allow her husband to join her. She asked to speak to someone else.  I went out and explained to the patient that at this time we are under a no visitor policy and our only exceptions are for dementia patients. She stated she had arthritis and needed to stay in her wheelchair. I offered my help to get the patient to registration and to the appropriate location for care.  Her husband continued to voice his being upset over the policy and then started complaining about prior encounters in the infusion room with issues surrounding port access. He stated he has complained to "all the higher ups, way above you at the main center" and he wants to know who will be accessing the port. I gave the names of the four nurses and Megan Christine stated she didn't know any of them. She agreed to come back to the infusion room and see if she knew anyone who she was comfortable with accessing her. She agreed to check into her appointment. As I pushed Megan Tate into the office her husband stated he was going to call "the higher ups" to complain about the situation.  I escorted her to registration and then back to infusion. She looked at the nurses available and then refused to be accessed or have labs drawn. She still wished to see Dr Marin Olp. I then brought her to an exam room and notified Dr Antonieta Pert RN of the situation and that she would just see Dr Marin Olp. Dr Marin Olp also notified.

## 2019-06-11 ENCOUNTER — Other Ambulatory Visit: Payer: Self-pay | Admitting: *Deleted

## 2019-06-11 DIAGNOSIS — C9110 Chronic lymphocytic leukemia of B-cell type not having achieved remission: Secondary | ICD-10-CM

## 2019-06-12 ENCOUNTER — Inpatient Hospital Stay: Payer: Medicare Other

## 2019-06-12 ENCOUNTER — Other Ambulatory Visit: Payer: Self-pay

## 2019-06-12 DIAGNOSIS — D508 Other iron deficiency anemias: Secondary | ICD-10-CM

## 2019-06-12 DIAGNOSIS — C9110 Chronic lymphocytic leukemia of B-cell type not having achieved remission: Secondary | ICD-10-CM | POA: Diagnosis not present

## 2019-06-12 DIAGNOSIS — Z79899 Other long term (current) drug therapy: Secondary | ICD-10-CM | POA: Diagnosis not present

## 2019-06-12 DIAGNOSIS — M129 Arthropathy, unspecified: Secondary | ICD-10-CM | POA: Diagnosis not present

## 2019-06-12 DIAGNOSIS — Z794 Long term (current) use of insulin: Secondary | ICD-10-CM | POA: Diagnosis not present

## 2019-06-12 LAB — CBC WITH DIFFERENTIAL (CANCER CENTER ONLY)
Abs Immature Granulocytes: 0.02 10*3/uL (ref 0.00–0.07)
Basophils Absolute: 0 10*3/uL (ref 0.0–0.1)
Basophils Relative: 0 %
Eosinophils Absolute: 0.2 10*3/uL (ref 0.0–0.5)
Eosinophils Relative: 2 %
HCT: 37.7 % (ref 36.0–46.0)
Hemoglobin: 11.7 g/dL — ABNORMAL LOW (ref 12.0–15.0)
Immature Granulocytes: 0 %
Lymphocytes Relative: 48 %
Lymphs Abs: 3.3 10*3/uL (ref 0.7–4.0)
MCH: 23.9 pg — ABNORMAL LOW (ref 26.0–34.0)
MCHC: 31 g/dL (ref 30.0–36.0)
MCV: 76.9 fL — ABNORMAL LOW (ref 80.0–100.0)
Monocytes Absolute: 1 10*3/uL (ref 0.1–1.0)
Monocytes Relative: 14 %
Neutro Abs: 2.4 10*3/uL (ref 1.7–7.7)
Neutrophils Relative %: 36 %
Platelet Count: 211 10*3/uL (ref 150–400)
RBC: 4.9 MIL/uL (ref 3.87–5.11)
RDW: 13.6 % (ref 11.5–15.5)
WBC Count: 6.9 10*3/uL (ref 4.0–10.5)
nRBC: 0 % (ref 0.0–0.2)

## 2019-06-12 LAB — CMP (CANCER CENTER ONLY)
ALT: 9 U/L (ref 0–44)
AST: 9 U/L — ABNORMAL LOW (ref 15–41)
Albumin: 3.9 g/dL (ref 3.5–5.0)
Alkaline Phosphatase: 92 U/L (ref 38–126)
Anion gap: 7 (ref 5–15)
BUN: 21 mg/dL (ref 8–23)
CO2: 29 mmol/L (ref 22–32)
Calcium: 11.1 mg/dL — ABNORMAL HIGH (ref 8.9–10.3)
Chloride: 100 mmol/L (ref 98–111)
Creatinine: 1.34 mg/dL — ABNORMAL HIGH (ref 0.44–1.00)
GFR, Est AFR Am: 46 mL/min — ABNORMAL LOW (ref >60)
GFR, Estimated: 39 mL/min — ABNORMAL LOW (ref >60)
Glucose, Bld: 267 mg/dL — ABNORMAL HIGH (ref 70–99)
Potassium: 4.3 mmol/L (ref 3.5–5.1)
Sodium: 136 mmol/L (ref 135–145)
Total Bilirubin: 0.6 mg/dL (ref 0.3–1.2)
Total Protein: 5.9 g/dL — ABNORMAL LOW (ref 6.5–8.1)

## 2019-06-12 MED ORDER — SODIUM CHLORIDE 0.9% FLUSH
10.0000 mL | Freq: Once | INTRAVENOUS | Status: AC
  Administered 2019-06-12: 10:00:00 10 mL
  Filled 2019-06-12: qty 10

## 2019-06-12 MED ORDER — HEPARIN SOD (PORK) LOCK FLUSH 100 UNIT/ML IV SOLN
500.0000 [IU] | Freq: Once | INTRAVENOUS | Status: AC
  Administered 2019-06-12: 10:00:00 500 [IU] via INTRAVENOUS
  Filled 2019-06-12: qty 5

## 2019-06-12 MED ORDER — SODIUM CHLORIDE 0.9% FLUSH
10.0000 mL | Freq: Once | INTRAVENOUS | Status: DC | PRN
  Filled 2019-06-12: qty 10

## 2019-06-12 NOTE — Patient Instructions (Signed)

## 2019-06-15 LAB — LACTATE DEHYDROGENASE: LDH: 118 U/L (ref 98–192)

## 2019-07-04 ENCOUNTER — Other Ambulatory Visit: Payer: Self-pay | Admitting: Hematology & Oncology

## 2019-07-04 DIAGNOSIS — C9110 Chronic lymphocytic leukemia of B-cell type not having achieved remission: Secondary | ICD-10-CM

## 2019-07-04 DIAGNOSIS — M25551 Pain in right hip: Secondary | ICD-10-CM

## 2019-07-04 DIAGNOSIS — Z1231 Encounter for screening mammogram for malignant neoplasm of breast: Secondary | ICD-10-CM

## 2019-07-27 ENCOUNTER — Telehealth: Payer: Self-pay | Admitting: Hematology & Oncology

## 2019-07-27 ENCOUNTER — Inpatient Hospital Stay: Payer: Medicare Other | Attending: Hematology & Oncology

## 2019-07-27 ENCOUNTER — Other Ambulatory Visit: Payer: Self-pay

## 2019-07-27 ENCOUNTER — Other Ambulatory Visit: Payer: Self-pay | Admitting: Hematology & Oncology

## 2019-07-27 VITALS — BP 193/85 | HR 47 | Temp 97.1°F | Resp 17

## 2019-07-27 DIAGNOSIS — C9110 Chronic lymphocytic leukemia of B-cell type not having achieved remission: Secondary | ICD-10-CM | POA: Diagnosis not present

## 2019-07-27 DIAGNOSIS — Z95828 Presence of other vascular implants and grafts: Secondary | ICD-10-CM

## 2019-07-27 DIAGNOSIS — Z452 Encounter for adjustment and management of vascular access device: Secondary | ICD-10-CM | POA: Diagnosis not present

## 2019-07-27 MED ORDER — SODIUM CHLORIDE 0.9% FLUSH
10.0000 mL | Freq: Once | INTRAVENOUS | Status: AC
  Administered 2019-07-27: 10 mL via INTRAVENOUS
  Filled 2019-07-27: qty 10

## 2019-07-27 MED ORDER — HEPARIN SOD (PORK) LOCK FLUSH 100 UNIT/ML IV SOLN
500.0000 [IU] | Freq: Once | INTRAVENOUS | Status: AC
  Administered 2019-07-27: 09:00:00 500 [IU] via INTRAVENOUS
  Filled 2019-07-27: qty 5

## 2019-07-27 NOTE — Telephone Encounter (Signed)
Patient was here in the office to have port flush and wished to speak with a scheduler regarding her next appointments.  She stated they were not scheduled correctly.  I printed her past schedules to show her they had been scheduled as requested by Dr Marin Olp.  She was very rude and stated she would not be back to see him on her scheduled date.  She then left and returned to the front office with her husband and demanded to see Dr Marin Olp stating it was no one's business.  We advised Megan Tate of this issue

## 2019-07-27 NOTE — Patient Instructions (Signed)

## 2019-08-03 ENCOUNTER — Other Ambulatory Visit: Payer: Self-pay | Admitting: Family Medicine

## 2019-08-03 NOTE — Telephone Encounter (Signed)
Requested medication (s) are due for refill today: no  Requested medication (s) are on the active medication list: yes  Last refill:  03/2015  Future visit scheduled:no  Notes to clinic:  Last refilled by historical provider    Requested Prescriptions  Pending Prescriptions Disp Refills   rosuvastatin (CRESTOR) 20 MG tablet       Sig: Take 1 tablet (20 mg total) by mouth daily.     Cardiovascular:  Antilipid - Statins Failed - 08/03/2019  2:47 PM      Failed - HDL in normal range and within 360 days    HDL  Date Value Ref Range Status  05/18/2019 36.00 (L) >39.00 mg/dL Final         Passed - Total Cholesterol in normal range and within 360 days    Cholesterol  Date Value Ref Range Status  05/18/2019 101 0 - 200 mg/dL Final    Comment:    ATP III Classification       Desirable:  < 200 mg/dL               Borderline High:  200 - 239 mg/dL          High:  > = 240 mg/dL         Passed - LDL in normal range and within 360 days    LDL Cholesterol  Date Value Ref Range Status  05/18/2019 49 0 - 99 mg/dL Final         Passed - Triglycerides in normal range and within 360 days    Triglycerides  Date Value Ref Range Status  05/18/2019 80.0 0.0 - 149.0 mg/dL Final    Comment:    Normal:  <150 mg/dLBorderline High:  150 - 199 mg/dL         Passed - Patient is not pregnant      Passed - Valid encounter within last 12 months    Recent Outpatient Visits          2 months ago Dysuria   [facility], [place], DO   2 months ago Post-menopausal   [facility] [place] Matthews, [place], DO   10 months ago Acute non-recurrent maxillary sinusitis   [facility] [place], [place], DO   1 year ago CLL (chronic lymphocytic leukemia) [facility])   [facility] E[facility] Hal, [place] Fries, MD   1 year ago Type 2 diabetes mellitus without complication, with long-term current use of insulin [facility])   [facility]  [place], [place], [place]

## 2019-08-03 NOTE — Telephone Encounter (Signed)
Patient requesting rosuvastatin 20mg , pharmacist informed patient rx was denied, informed patient please allow 48 to 72 hour turn around time. Patient would like an upcoming call if appointment is needed.    [id number] [place], [place]

## 2019-08-04 ENCOUNTER — Other Ambulatory Visit: Payer: Self-pay | Admitting: Family Medicine

## 2019-08-06 ENCOUNTER — Other Ambulatory Visit: Payer: Self-pay | Admitting: Family Medicine

## 2019-08-10 ENCOUNTER — Other Ambulatory Visit: Payer: Medicare Other

## 2019-08-10 ENCOUNTER — Ambulatory Visit: Payer: Medicare Other | Admitting: Hematology & Oncology

## 2019-08-31 ENCOUNTER — Inpatient Hospital Stay: Payer: Medicare Other

## 2019-08-31 ENCOUNTER — Inpatient Hospital Stay (HOSPITAL_BASED_OUTPATIENT_CLINIC_OR_DEPARTMENT_OTHER): Payer: Medicare Other | Admitting: Hematology & Oncology

## 2019-08-31 ENCOUNTER — Encounter: Payer: Self-pay | Admitting: Hematology & Oncology

## 2019-08-31 ENCOUNTER — Other Ambulatory Visit: Payer: Self-pay

## 2019-08-31 ENCOUNTER — Other Ambulatory Visit: Payer: Self-pay | Admitting: Family

## 2019-08-31 ENCOUNTER — Inpatient Hospital Stay: Payer: Medicare Other | Attending: Hematology & Oncology

## 2019-08-31 VITALS — BP 184/74 | HR 44 | Temp 97.5°F | Resp 17 | Wt 220.0 lb

## 2019-08-31 DIAGNOSIS — C9110 Chronic lymphocytic leukemia of B-cell type not having achieved remission: Secondary | ICD-10-CM | POA: Diagnosis not present

## 2019-08-31 DIAGNOSIS — Z79899 Other long term (current) drug therapy: Secondary | ICD-10-CM | POA: Diagnosis not present

## 2019-08-31 DIAGNOSIS — Z95828 Presence of other vascular implants and grafts: Secondary | ICD-10-CM

## 2019-08-31 DIAGNOSIS — E119 Type 2 diabetes mellitus without complications: Secondary | ICD-10-CM | POA: Diagnosis not present

## 2019-08-31 LAB — CMP (CANCER CENTER ONLY)
ALT: 7 U/L (ref 0–44)
AST: 10 U/L — ABNORMAL LOW (ref 15–41)
Albumin: 4.5 g/dL (ref 3.5–5.0)
Alkaline Phosphatase: 85 U/L (ref 38–126)
Anion gap: 8 (ref 5–15)
BUN: 24 mg/dL — ABNORMAL HIGH (ref 8–23)
CO2: 26 mmol/L (ref 22–32)
Calcium: 10.9 mg/dL — ABNORMAL HIGH (ref 8.9–10.3)
Chloride: 102 mmol/L (ref 98–111)
Creatinine: 1.55 mg/dL — ABNORMAL HIGH (ref 0.44–1.00)
GFR, Est AFR Am: 38 mL/min — ABNORMAL LOW (ref >60)
GFR, Estimated: 33 mL/min — ABNORMAL LOW (ref >60)
Glucose, Bld: 250 mg/dL — ABNORMAL HIGH (ref 70–99)
Potassium: 4 mmol/L (ref 3.5–5.1)
Sodium: 136 mmol/L (ref 135–145)
Total Bilirubin: 0.6 mg/dL (ref 0.3–1.2)
Total Protein: 6.6 g/dL (ref 6.5–8.1)

## 2019-08-31 LAB — LACTATE DEHYDROGENASE: LDH: 131 U/L (ref 98–192)

## 2019-08-31 LAB — CBC WITH DIFFERENTIAL (CANCER CENTER ONLY)
Abs Immature Granulocytes: 0.02 10*3/uL (ref 0.00–0.07)
Basophils Absolute: 0 10*3/uL (ref 0.0–0.1)
Basophils Relative: 0 %
Eosinophils Absolute: 0.2 10*3/uL (ref 0.0–0.5)
Eosinophils Relative: 2 %
HCT: 37.4 % (ref 36.0–46.0)
Hemoglobin: 12 g/dL (ref 12.0–15.0)
Immature Granulocytes: 0 %
Lymphocytes Relative: 44 %
Lymphs Abs: 3.3 10*3/uL (ref 0.7–4.0)
MCH: 24.4 pg — ABNORMAL LOW (ref 26.0–34.0)
MCHC: 32.1 g/dL (ref 30.0–36.0)
MCV: 76.2 fL — ABNORMAL LOW (ref 80.0–100.0)
Monocytes Absolute: 1 10*3/uL (ref 0.1–1.0)
Monocytes Relative: 13 %
Neutro Abs: 3.1 10*3/uL (ref 1.7–7.7)
Neutrophils Relative %: 41 %
Platelet Count: 205 10*3/uL (ref 150–400)
RBC: 4.91 MIL/uL (ref 3.87–5.11)
RDW: 13.2 % (ref 11.5–15.5)
WBC Count: 7.7 10*3/uL (ref 4.0–10.5)
nRBC: 0 % (ref 0.0–0.2)

## 2019-08-31 MED ORDER — HEPARIN SOD (PORK) LOCK FLUSH 100 UNIT/ML IV SOLN
500.0000 [IU] | Freq: Once | INTRAVENOUS | Status: AC
  Administered 2019-08-31: 500 [IU] via INTRAVENOUS
  Filled 2019-08-31: qty 5

## 2019-08-31 MED ORDER — SODIUM CHLORIDE 0.9% FLUSH
10.0000 mL | Freq: Once | INTRAVENOUS | Status: AC
  Administered 2019-08-31: 10 mL via INTRAVENOUS
  Filled 2019-08-31: qty 10

## 2019-08-31 NOTE — Patient Instructions (Signed)

## 2019-08-31 NOTE — Progress Notes (Signed)
Hematology and Oncology Follow Up Visit  Megan Tate [id number] 1945-12-02 73 y.o. 08/31/2019   Principle Diagnosis:  Chronic lymphocytic leukemia- Trisomy 12  Past Therapy:             Status post 6 cycles of Gazyva/Bendamustine - completed4/2016 Maintenance Gazyva every 3 months s/p cycle 11 -completed 2 years in June 2018  Current Therapy:   Observation   Interim History:  Megan Tate is here today for follow-up.  Megan Tate is doing pretty well.  I think that it was easier for Megan Tate today being able to come in and see me.  I think the less amount of people Megan Tate has to see in our office it, the easier it is for Megan Tate.  Megan Tate had a good Thanksgiving.  Megan Tate was with Megan Tate immediate family.  Megan Tate husband is a Theme park manager.  He has been quite busy.  Megan Tate still is having some hip problems.  They do not seem to be as prominent right now.  Megan Tate had the Port-A-Cath accessed.  Blood work came out without any problems.  This also made Megan Tate feel better.  Megan Tate still is having problems with Megan Tate diabetes.  Megan Tate blood sugar was 250 today.  I am sure that Megan Tate family doctor or Megan Tate doctor take care of Megan Tate diabetes are trying to help out with this.  Megan Tate has had no change in bowel or bladder habits.  Megan Tate is not noted any swollen lymph nodes.  There is been no bleeding.  Megan Tate has had no fever.  Overall, Megan Tate performance status is ECOG 1.     Medications:  Allergies as of 08/31/2019      Reactions   Tylenol [acetaminophen] Other (See Comments)   Messes with stomach      Medication List       Accurate as of August 31, 2019 10:00 AM. If you have any questions, ask your nurse or doctor.        cloNIDine 0.2 MG tablet Commonly known as: CATAPRES TAKE 1 TABLET BY MOUTH TWICE A DAY   dorzolamide-timolol 22.3-6.8 MG/ML ophthalmic solution Commonly known as: COSOPT Place 1 drop into both eyes 2 (two) times daily.   doxepin 10 MG capsule Commonly known as: SINEQUAN Take 1 capsule by mouth daily.   Ferrous  Sulfate 50 MG Tbcr Commonly known as: Slow Release Iron Take 1 tablet by mouth daily.   fluticasone 50 MCG/ACT nasal spray Commonly known as: FLONASE Place 2 sprays into both nostrils daily.   folic acid 1 MG tablet Commonly known as: FOLVITE TAKE 2 TABLETS (2 MG TOTAL) BY MOUTH DAILY.   glucose blood test strip Commonly known as: ONE TOUCH ULTRA TEST USE TO TEST BLOOD SUGAR BEFORE MEALS AND AT BEDTIME   Insulin Pen Needle 32G X 4 MM Misc Commonly known as: BD Pen Needle Nano U/F Patient is to check blood sugar daily before meals and bedtime. Dx. E11.9   Janumet XR 50-1000 MG Tb24 Generic drug: SitaGLIPtin-MetFORMIN HCl Take 2 tablets by mouth daily.   Levemir FlexTouch 100 UNIT/ML Pen Generic drug: Insulin Detemir INJECT 15 UNITS INTO THE SKIN DAILY. **PLEASE SCHEDULE FOLLOW-UP VISIT WITH PRESCRIBER**   lidocaine-prilocaine cream Commonly known as: EMLA Apply to Port-A-Cath site 1 hour prior to treatment   Linzess 145 MCG Caps capsule Generic drug: linaclotide Take 1 capsule by mouth daily.   losartan 100 MG tablet Commonly known as: COZAAR TAKE 1 TABLET BY MOUTH EVERY DAY   meclizine 25 MG tablet Commonly known as:  ANTIVERT Take 1 tablet (25 mg total) by mouth 3 (three) times daily as needed for dizziness.   metoprolol 200 MG 24 hr tablet Commonly known as: TOPROL-XL TAKE 1 TABLET BY MOUTH EVERY DAY   ONE TOUCH ULTRA SYSTEM KIT w/Device Kit 1 kit by Does not apply route once. Check blood sugar daily before meals and at bedtime.   rosuvastatin 20 MG tablet Commonly known as: CRESTOR TAKE 1 TABLET BY MOUTH EVERY DAY   spironolactone 50 MG tablet Commonly known as: ALDACTONE TAKE 1 TABLET BY MOUTH EVERY DAY   tiZANidine 2 MG tablet Commonly known as: ZANAFLEX Take 1 tablet (2 mg total) by mouth every 8 (eight) hours as needed for muscle spasms.   UNABLE TO FIND One Touch Ultra Blood Glucose Monitoring Meter. Dx E11.9       Allergies:  Allergies   Allergen Reactions  . Tylenol [Acetaminophen] Other (See Comments)    Messes with stomach    Past Medical History, Surgical history, Social history, and Family History were reviewed and updated.  Review of Systems: Review of Systems  Constitutional: Negative.   HENT: Negative.   Eyes: Negative.   Respiratory: Negative.   Cardiovascular: Negative.   Gastrointestinal: Negative.   Genitourinary: Negative.   Musculoskeletal: Negative.   Skin: Negative.   Neurological: Negative.   Endo/Heme/Allergies: Negative.   Psychiatric/Behavioral: Negative.      Physical Exam:  weight is 220 lb (99.8 kg). Megan Tate temporal temperature is 97.5 F (36.4 C) (abnormal). Megan Tate blood pressure is 184/74 (abnormal) and Megan Tate pulse is 44 (abnormal). Megan Tate respiration is 17.   Wt Readings from Last 3 Encounters:  08/31/19 220 lb (99.8 kg)  06/10/19 217 lb (98.4 kg)  06/03/19 216 lb (98 kg)    Physical Exam Vitals signs reviewed.  HENT:     Head: Normocephalic and atraumatic.  Eyes:     Pupils: Pupils are equal, round, and reactive to light.  Neck:     Musculoskeletal: Normal range of motion.  Cardiovascular:     Rate and Rhythm: Normal rate and regular rhythm.     Heart sounds: Normal heart sounds.  Pulmonary:     Effort: Pulmonary effort is normal.     Breath sounds: Normal breath sounds.  Abdominal:     General: Bowel sounds are normal.     Palpations: Abdomen is soft.  Musculoskeletal: Normal range of motion.        General: No tenderness or deformity.  Lymphadenopathy:     Cervical: No cervical adenopathy.  Skin:    General: Skin is warm and dry.     Findings: No erythema or rash.  Neurological:     Mental Status: Megan Tate is alert and oriented to person, place, and time.  Psychiatric:        Behavior: Behavior normal.        Thought Content: Thought content normal.        Judgment: Judgment normal.      Lab Results  Component Value Date   WBC 7.7 08/31/2019   HGB 12.0 08/31/2019    HCT 37.4 08/31/2019   MCV 76.2 (L) 08/31/2019   PLT 205 08/31/2019   Lab Results  Component Value Date   FERRITIN 442 (H) 07/31/2018   IRON 88 07/31/2018   TIBC 253 07/31/2018   UIBC 165 07/31/2018   IRONPCTSAT 35 07/31/2018   Lab Results  Component Value Date   RETICCTPCT 1.1 05/08/2018   RBC 4.91 08/31/2019   RETICCTABS 65.4 04/07/2014  Lab Results  Component Value Date   KPAFRELGTCHN 14.1 03/11/2019   LAMBDASER 5.5 (L) 03/11/2019   KAPLAMBRATIO 2.56 (H) 03/11/2019   Lab Results  Component Value Date   IGGSERUM 580 (L) 03/11/2019   IGA 36 (L) 03/11/2019   IGMSERUM 8 (L) 03/11/2019   Lab Results  Component Value Date   TOTALPROTELP 6.1 03/11/2019   ALBUMINELP 3.7 03/11/2019   A1GS 0.2 03/11/2019   A2GS 0.9 03/11/2019   BETS 0.8 03/11/2019   BETA2SER 0.2 03/30/2015   GAMS 0.5 03/11/2019   MSPIKE Not Observed 03/11/2019   SPEI * 03/30/2015     Chemistry      Component Value Date/Time   NA 136 08/31/2019 0917   NA 142 09/05/2017 0751   K 4.0 08/31/2019 0917   K 3.8 09/05/2017 0751   CL 102 08/31/2019 0917   CL 104 09/05/2017 0751   CO2 26 08/31/2019 0917   CO2 27 09/05/2017 0751   BUN 24 (H) 08/31/2019 0917   BUN 11 09/05/2017 0751   CREATININE 1.55 (H) 08/31/2019 0917   CREATININE 1.1 09/05/2017 0751      Component Value Date/Time   CALCIUM 10.9 (H) 08/31/2019 0917   CALCIUM 10.8 (H) 09/05/2017 0751   ALKPHOS 85 08/31/2019 0917   ALKPHOS 88 (H) 09/05/2017 0751   AST 10 (L) 08/31/2019 0917   ALT 7 08/31/2019 0917   ALT 14 09/05/2017 0751   BILITOT 0.6 08/31/2019 0917      Impression and Plan: Megan Tate is a pleasant 73 yo African American female with chronic lymphocytic leukemia. Megan Tate completed treatment with Gazyva/Bendamustine in April 2016 and maintenance Gazyva in June 2018.   Coming back every 6 weeks really seems to work well for Megan Tate.  Megan Tate has peace of mind knowing that Megan Tate can come in every 6 weeks to have Megan Tate Port-A-Cath flushed and have  Megan Tate lab work done.  As always, we ended our meeting with a good prayer.  Megan Tate does have a lot of faith.  It is nice that we can share this together.  Volanda Napoleon, MD 11/30/202010:00 AM

## 2019-10-01 DIAGNOSIS — E113293 Type 2 diabetes mellitus with mild nonproliferative diabetic retinopathy without macular edema, bilateral: Secondary | ICD-10-CM | POA: Diagnosis not present

## 2019-10-01 DIAGNOSIS — Z7984 Long term (current) use of oral hypoglycemic drugs: Secondary | ICD-10-CM | POA: Diagnosis not present

## 2019-10-01 DIAGNOSIS — Z83511 Family history of glaucoma: Secondary | ICD-10-CM | POA: Diagnosis not present

## 2019-10-01 DIAGNOSIS — H40053 Ocular hypertension, bilateral: Secondary | ICD-10-CM | POA: Diagnosis not present

## 2019-10-19 ENCOUNTER — Other Ambulatory Visit: Payer: Self-pay

## 2019-10-19 ENCOUNTER — Encounter: Payer: Self-pay | Admitting: Hematology & Oncology

## 2019-10-19 ENCOUNTER — Inpatient Hospital Stay: Payer: Medicare Other

## 2019-10-19 ENCOUNTER — Inpatient Hospital Stay (HOSPITAL_BASED_OUTPATIENT_CLINIC_OR_DEPARTMENT_OTHER): Payer: Medicare Other | Admitting: Hematology & Oncology

## 2019-10-19 ENCOUNTER — Inpatient Hospital Stay: Payer: Medicare Other | Attending: Hematology & Oncology

## 2019-10-19 VITALS — BP 109/62 | HR 63 | Temp 97.7°F | Resp 18 | Ht 65.0 in | Wt 218.1 lb

## 2019-10-19 DIAGNOSIS — C9110 Chronic lymphocytic leukemia of B-cell type not having achieved remission: Secondary | ICD-10-CM

## 2019-10-19 DIAGNOSIS — Z794 Long term (current) use of insulin: Secondary | ICD-10-CM | POA: Diagnosis not present

## 2019-10-19 DIAGNOSIS — Z79899 Other long term (current) drug therapy: Secondary | ICD-10-CM | POA: Insufficient documentation

## 2019-10-19 DIAGNOSIS — R42 Dizziness and giddiness: Secondary | ICD-10-CM | POA: Insufficient documentation

## 2019-10-19 DIAGNOSIS — E119 Type 2 diabetes mellitus without complications: Secondary | ICD-10-CM

## 2019-10-19 LAB — CBC WITH DIFFERENTIAL (CANCER CENTER ONLY)
Abs Immature Granulocytes: 0.08 10*3/uL — ABNORMAL HIGH (ref 0.00–0.07)
Basophils Absolute: 0 10*3/uL (ref 0.0–0.1)
Basophils Relative: 0 %
Eosinophils Absolute: 0 10*3/uL (ref 0.0–0.5)
Eosinophils Relative: 1 %
HCT: 37.3 % (ref 36.0–46.0)
Hemoglobin: 11.8 g/dL — ABNORMAL LOW (ref 12.0–15.0)
Immature Granulocytes: 1 %
Lymphocytes Relative: 44 %
Lymphs Abs: 2.5 10*3/uL (ref 0.7–4.0)
MCH: 23.8 pg — ABNORMAL LOW (ref 26.0–34.0)
MCHC: 31.6 g/dL (ref 30.0–36.0)
MCV: 75.4 fL — ABNORMAL LOW (ref 80.0–100.0)
Monocytes Absolute: 1 10*3/uL (ref 0.1–1.0)
Monocytes Relative: 17 %
Neutro Abs: 2.2 10*3/uL (ref 1.7–7.7)
Neutrophils Relative %: 37 %
Platelet Count: 226 10*3/uL (ref 150–400)
RBC: 4.95 MIL/uL (ref 3.87–5.11)
RDW: 13.1 % (ref 11.5–15.5)
WBC Count: 5.8 10*3/uL (ref 4.0–10.5)
nRBC: 0 % (ref 0.0–0.2)

## 2019-10-19 LAB — CMP (CANCER CENTER ONLY)
ALT: 7 U/L (ref 0–44)
AST: 11 U/L — ABNORMAL LOW (ref 15–41)
Albumin: 3.8 g/dL (ref 3.5–5.0)
Alkaline Phosphatase: 58 U/L (ref 38–126)
Anion gap: 7 (ref 5–15)
BUN: 14 mg/dL (ref 8–23)
CO2: 27 mmol/L (ref 22–32)
Calcium: 10.8 mg/dL — ABNORMAL HIGH (ref 8.9–10.3)
Chloride: 100 mmol/L (ref 98–111)
Creatinine: 1.39 mg/dL — ABNORMAL HIGH (ref 0.44–1.00)
GFR, Est AFR Am: 44 mL/min — ABNORMAL LOW (ref >60)
GFR, Estimated: 38 mL/min — ABNORMAL LOW (ref >60)
Glucose, Bld: 266 mg/dL — ABNORMAL HIGH (ref 70–99)
Potassium: 3.9 mmol/L (ref 3.5–5.1)
Sodium: 134 mmol/L — ABNORMAL LOW (ref 135–145)
Total Bilirubin: 0.7 mg/dL (ref 0.3–1.2)
Total Protein: 5.5 g/dL — ABNORMAL LOW (ref 6.5–8.1)

## 2019-10-19 NOTE — Progress Notes (Signed)
Hematology and Oncology Follow Up Visit  Megan Tate [id number] [birth year] 74 y.o. 10/19/2019   Principle Diagnosis:  Chronic lymphocytic leukemia- Trisomy 12  Past Therapy:             Status post 6 cycles of Gazyva/Bendamustine - completed4/2016 Maintenance Gazyva every 3 months s/p cycle 11 -completed 2 years in June 2018  Current Therapy:   Observation   Interim History:  [name] is here today for follow-up.  Her problem right now is some dizziness.  Not sure exactly what might be causing this.  She has had it for a couple months.  There is no vertigo.  I will know if this might be related to her blood sugars being chronically elevated.  All of her electrolytes look pretty good.  Her blood sugar is 266.  She is not really anemic that would cause dizziness.  I will see medicines that she is on that might cause dizziness although she is on quite a few.  I told her that we could do scans on her to make sure there is nothing going on with respect to recurrence of her CLL.  I think this is reasonable.  Her last set of scans were done back in 2016.  She has had no nausea or vomiting.  She has had no diarrhea.  There is been no fever.  She has had no rashes.  She had a decent Christmas holiday.  Overall, her performance status is ECOG 1.     Medications:  Allergies as of 10/19/2019      Reactions   Tylenol [acetaminophen] Other (See Comments)   Messes with stomach      Medication List       Accurate as of October 19, 2019  9:25 AM. If you have any questions, ask your nurse or doctor.        cloNIDine 0.2 MG tablet Commonly known as: CATAPRES TAKE 1 TABLET BY MOUTH TWICE A DAY   dorzolamide-timolol 22.3-6.8 MG/ML ophthalmic solution Commonly known as: COSOPT Place 1 drop into both eyes 2 (two) times daily.   doxepin 10 MG capsule Commonly known as: SINEQUAN Take 1 capsule by mouth daily.   Ferrous Sulfate 50 MG Tbcr Commonly known as: Slow Release  Iron Take 1 tablet by mouth daily.   fluticasone 50 MCG/ACT nasal spray Commonly known as: FLONASE Place 2 sprays into both nostrils daily.   folic acid 1 MG tablet Commonly known as: FOLVITE TAKE 2 TABLETS (2 MG TOTAL) BY MOUTH DAILY.   glucose blood test strip Commonly known as: ONE TOUCH ULTRA TEST USE TO TEST BLOOD SUGAR BEFORE MEALS AND AT BEDTIME   Insulin Pen Needle 32G X 4 MM Misc Commonly known as: BD Pen Needle Nano U/F Patient is to check blood sugar daily before meals and bedtime. Dx. E11.9   Janumet XR 50-1000 MG Tb24 Generic drug: SitaGLIPtin-MetFORMIN HCl Take 2 tablets by mouth daily.   Levemir FlexTouch 100 UNIT/ML Pen Generic drug: Insulin Detemir INJECT 15 UNITS INTO THE SKIN DAILY. **PLEASE SCHEDULE FOLLOW-UP VISIT WITH PRESCRIBER**   lidocaine-prilocaine cream Commonly known as: EMLA Apply to Port-A-Cath site 1 hour prior to treatment   Linzess 145 MCG Caps capsule Generic drug: linaclotide Take 1 capsule by mouth daily.   losartan 100 MG tablet Commonly known as: COZAAR TAKE 1 TABLET BY MOUTH EVERY DAY   meclizine 25 MG tablet Commonly known as: ANTIVERT Take 1 tablet (25 mg total) by mouth 3 (three) times daily as  needed for dizziness.   metoprolol 200 MG 24 hr tablet Commonly known as: TOPROL-XL TAKE 1 TABLET BY MOUTH EVERY DAY   ONE TOUCH ULTRA SYSTEM KIT w/Device Kit 1 kit by Does not apply route once. Check blood sugar daily before meals and at bedtime.   rosuvastatin 20 MG tablet Commonly known as: CRESTOR TAKE 1 TABLET BY MOUTH EVERY DAY   spironolactone 50 MG tablet Commonly known as: ALDACTONE TAKE 1 TABLET BY MOUTH EVERY DAY   tiZANidine 2 MG tablet Commonly known as: ZANAFLEX Take 1 tablet (2 mg total) by mouth every 8 (eight) hours as needed for muscle spasms.   UNABLE TO FIND One Touch Ultra Blood Glucose Monitoring Meter. Dx E11.9       Allergies:  Allergies  Allergen Reactions  . Tylenol [Acetaminophen] Other  (See Comments)    Messes with stomach    Past Medical History, Surgical history, Social history, and Family History were reviewed and updated.  Review of Systems: Review of Systems  Constitutional: Negative.   HENT: Negative.   Eyes: Negative.   Respiratory: Negative.   Cardiovascular: Negative.   Gastrointestinal: Negative.   Genitourinary: Negative.   Musculoskeletal: Negative.   Skin: Negative.   Neurological: Negative.   Endo/Heme/Allergies: Negative.   Psychiatric/Behavioral: Negative.      Physical Exam:  height is '5\' 5"'  (1.651 m) and weight is 218 lb 1.9 oz (98.9 kg). Her temporal temperature is 97.7 F (36.5 C). Her blood pressure is 109/62 and her pulse is 63. Her respiration is 18 and oxygen saturation is 99%.   Wt Readings from Last 3 Encounters:  10/19/19 218 lb 1.9 oz (98.9 kg)  08/31/19 220 lb (99.8 kg)  06/10/19 217 lb (98.4 kg)    Physical Exam Vitals reviewed.  HENT:     Head: Normocephalic and atraumatic.  Eyes:     Pupils: Pupils are equal, round, and reactive to light.  Cardiovascular:     Rate and Rhythm: Normal rate and regular rhythm.     Heart sounds: Normal heart sounds.  Pulmonary:     Effort: Pulmonary effort is normal.     Breath sounds: Normal breath sounds.  Abdominal:     General: Bowel sounds are normal.     Palpations: Abdomen is soft.  Musculoskeletal:        General: No tenderness or deformity. Normal range of motion.     Cervical back: Normal range of motion.  Lymphadenopathy:     Cervical: No cervical adenopathy.  Skin:    General: Skin is warm and dry.     Findings: No erythema or rash.  Neurological:     Mental Status: She is alert and oriented to person, place, and time.  Psychiatric:        Behavior: Behavior normal.        Thought Content: Thought content normal.        Judgment: Judgment normal.      Lab Results  Component Value Date   WBC 5.8 10/19/2019   HGB 11.8 (L) 10/19/2019   HCT 37.3 10/19/2019    MCV 75.4 (L) 10/19/2019   PLT 226 10/19/2019   Lab Results  Component Value Date   FERRITIN 442 (H) 07/31/2018   IRON 88 07/31/2018   TIBC 253 07/31/2018   UIBC 165 07/31/2018   IRONPCTSAT 35 07/31/2018   Lab Results  Component Value Date   RETICCTPCT 1.1 05/08/2018   RBC 4.95 10/19/2019   RETICCTABS 65.4 04/07/2014  Lab Results  Component Value Date   KPAFRELGTCHN 14.1 03/11/2019   LAMBDASER 5.5 (L) 03/11/2019   KAPLAMBRATIO 2.56 (H) 03/11/2019   Lab Results  Component Value Date   IGGSERUM 580 (L) 03/11/2019   IGA 36 (L) 03/11/2019   IGMSERUM 8 (L) 03/11/2019   Lab Results  Component Value Date   TOTALPROTELP 6.1 03/11/2019   ALBUMINELP 3.7 03/11/2019   A1GS 0.2 03/11/2019   A2GS 0.9 03/11/2019   BETS 0.8 03/11/2019   BETA2SER 0.2 03/30/2015   GAMS 0.5 03/11/2019   MSPIKE Not Observed 03/11/2019   SPEI * 03/30/2015     Chemistry      Component Value Date/Time   NA 136 08/31/2019 0917   NA 142 09/05/2017 0751   K 4.0 08/31/2019 0917   K 3.8 09/05/2017 0751   CL 102 08/31/2019 0917   CL 104 09/05/2017 0751   CO2 26 08/31/2019 0917   CO2 27 09/05/2017 0751   BUN 24 (H) 08/31/2019 0917   BUN 11 09/05/2017 0751   CREATININE 1.55 (H) 08/31/2019 0917   CREATININE 1.1 09/05/2017 0751      Component Value Date/Time   CALCIUM 10.9 (H) 08/31/2019 0917   CALCIUM 10.8 (H) 09/05/2017 0751   ALKPHOS 85 08/31/2019 0917   ALKPHOS 88 (H) 09/05/2017 0751   AST 10 (L) 08/31/2019 0917   ALT 7 08/31/2019 0917   ALT 14 09/05/2017 0751   BILITOT 0.6 08/31/2019 0917      Impression and Plan: Ms. Mcgurn is a pleasant 74 yo African American female with chronic lymphocytic leukemia. She completed treatment with Gazyva/Bendamustine in April 2016 and maintenance Gazyva in June 2018.   Again, I am not sure why she has this dizziness.  If the CAT scans do not show [place] anything, then we will probably have to get her back to her family doctor for an evaluation.  I am not sure  if she needs to be seen by ENT.  Again I do not see anything that looks like vertigo or nystagmus.  I spent about 30 minutes with her today.  All the time spent face-to-face.  We went over her lab work.  I had to order the CT scans.  I will plan to see her back in 6 weeks unless we find something on the CT scan that will need her to come in sooner.  Volanda Napoleon, MD 1/18/20219:25 AM

## 2019-10-19 NOTE — Patient Instructions (Signed)

## 2019-10-20 ENCOUNTER — Encounter: Payer: Self-pay | Admitting: Family Medicine

## 2019-10-20 ENCOUNTER — Ambulatory Visit (INDEPENDENT_AMBULATORY_CARE_PROVIDER_SITE_OTHER): Payer: Medicare Other | Admitting: Family Medicine

## 2019-10-20 ENCOUNTER — Other Ambulatory Visit: Payer: Self-pay

## 2019-10-20 VITALS — BP 100/60 | HR 57 | Temp 96.9°F | Ht 65.0 in

## 2019-10-20 DIAGNOSIS — E119 Type 2 diabetes mellitus without complications: Secondary | ICD-10-CM

## 2019-10-20 DIAGNOSIS — R630 Anorexia: Secondary | ICD-10-CM | POA: Diagnosis not present

## 2019-10-20 DIAGNOSIS — Z794 Long term (current) use of insulin: Secondary | ICD-10-CM | POA: Diagnosis not present

## 2019-10-20 DIAGNOSIS — R5383 Other fatigue: Secondary | ICD-10-CM

## 2019-10-20 DIAGNOSIS — C9110 Chronic lymphocytic leukemia of B-cell type not having achieved remission: Secondary | ICD-10-CM

## 2019-10-20 DIAGNOSIS — R300 Dysuria: Secondary | ICD-10-CM | POA: Diagnosis not present

## 2019-10-20 LAB — POCT URINALYSIS DIPSTICK
Blood, UA: NEGATIVE
Glucose, UA: NEGATIVE
Ketones, UA: NEGATIVE
Nitrite, UA: NEGATIVE
Protein, UA: POSITIVE — AB
Spec Grav, UA: 1.02 (ref 1.010–1.025)
Urobilinogen, UA: 1 E.U./dL
pH, UA: 5.5 (ref 5.0–8.0)

## 2019-10-20 MED ORDER — CEPHALEXIN 500 MG PO CAPS: 500.0000 mg | ORAL_CAPSULE | Freq: Two times a day (BID) | ORAL | 0 refills | Status: AC

## 2019-10-20 MED ORDER — ONETOUCH ULTRA 2 W/DEVICE KIT: PACK | 0 refills | Status: DC

## 2019-10-20 MED ORDER — METOPROLOL SUCCINATE ER 200 MG PO TB24: 100.0000 mg | ORAL_TABLET | Freq: Every day | ORAL | 2 refills | Status: DC

## 2019-10-20 MED ORDER — ONETOUCH DELICA PLUS LANCET33G MISC: 1.0000 | Freq: Four times a day (QID) | 3 refills | Status: DC

## 2019-10-20 MED ORDER — ONETOUCH ULTRA VI STRP: ORAL_STRIP | 12 refills | Status: DC

## 2019-10-20 NOTE — Patient Instructions (Addendum)
Your heart rate and blood pressure are a little low today.   Please decrease your toprol (metoprolol) to 100mg  (1/2 tab) each day.  We'll call you with lab results and additional recommendations.  Check blood sugars at home regularly.   Please call for any new or worsening symptoms.

## 2019-10-20 NOTE — Assessment & Plan Note (Signed)
Several factors likely contributing to how she is feeling: BP and HR are a little low today. EKG completed and reviewed, NSR with sinus pause.   Will have her reduce toprol to 1/2 tab daily.   It also appears that she has a UTI, will start cephalexin 500mg  BID x7 days.  Urine sent for culture.   Blood sugars not well controlled, new meter sent in to monitor.  Discussed compliance to medications.  Update a1c  Check TSH   Instructed to push fluids.

## 2019-10-20 NOTE — Progress Notes (Signed)
Megan Tate - 74 y.o. female MRN [id number]  Date of birth: 04/08/1946  Subjective Chief Complaint  Patient presents with  . Urinary Tract Infection    Pt c/o fatique, frequent urine, loss of appitate, should pain when bending over.  Pt has been feeling this way for over a week now. Pt had lab work from yesterday    HPI Megan Tate is a 74 y.o. female with history of CLL, T2DM and HTN here today with complaint of increased fatigue, urinary frequency, decreased appetite and bilateral shoulder pain, worse with leaning forward.  She reports that symptoms started about 1 week ago.  She was seen by her oncologist, Dr. Marin Olp yesterday and reported some dizziness. She had labs yesterday which show stable renal function, electrolytes and blood counts.  Glucose is elevated.  She denies fever, chills, respiratory symptoms, increased swelling, chest pain, shortness of breath, palpitations.  She has had some mild nausea.  Had 1 loose stool this morning otherwise bowels are moving normally.  Denies dark/tarry stools.   ROS:  A comprehensive ROS was completed and negative except as noted per HPI   Allergies  Allergen Reactions  . Tylenol [Acetaminophen] Other (See Comments)    Messes with stomach    Past Medical History:  Diagnosis Date  . Cancer ([place]) CLL  . CLL (chronic lymphocytic leukemia) ([place]) 08/16/2014  . Diabetes mellitus without complication ([place])   . Hypertension     History reviewed. No pertinent surgical history.  Social History   Socioeconomic History  . Marital status: Single    Spouse name: Not on file  . Number of children: Not on file  . Years of education: Not on file  . Highest education level: Not on file  Occupational History  . Not on file  Tobacco Use  . Smoking status: Never Smoker  . Smokeless tobacco: Never Used  . Tobacco comment: never used tobacco  Substance and Sexual Activity  . Alcohol use: Never    Alcohol/week: 0.0 standard drinks  . Drug use:  Never  . Sexual activity: Not on file  Other Topics Concern  . Not on file  Social History Narrative  . Not on file   Social Determinants of Health   Financial Resource Strain:   . Difficulty of Paying Living Expenses: Not on file  Food Insecurity:   . Worried About Charity fundraiser in the Last Year: Not on file  . Ran Out of Food in the Last Year: Not on file  Transportation Needs:   . Lack of Transportation (Medical): Not on file  . Lack of Transportation (Non-Medical): Not on file  Physical Activity:   . Days of Exercise per Week: Not on file  . Minutes of Exercise per Session: Not on file  Stress:   . Feeling of Stress : Not on file  Social Connections:   . Frequency of Communication with Friends and Family: Not on file  . Frequency of Social Gatherings with Friends and Family: Not on file  . Attends Religious Services: Not on file  . Active Member of Clubs or Organizations: Not on file  . Attends Archivist Meetings: Not on file  . Marital Status: Not on file    History reviewed. No pertinent family history.  Health Maintenance  Topic Date Due  . FOOT EXAM  03/10/1957  . TETANUS/TDAP  03/10/1966  . COLONOSCOPY  03/10/1997  . DEXA SCAN  03/10/2012  . PNA vac Low Risk Adult (1 of  2 - PCV13) 03/10/2012  . INFLUENZA VACCINE  12/30/2019 (Originally 05/02/2019)  . HEMOGLOBIN A1C  11/18/2019  . OPHTHALMOLOGY EXAM  04/13/2020  . MAMMOGRAM  08/05/2020  . Hepatitis C Screening  Completed    ----------------------------------------------------------------------------------------------------------------------------------------------------------------------------------------------------------------- Physical Exam BP 100/60 (BP Location: Right Arm, Patient Position: Sitting, Cuff Size: Normal)   Pulse (!) 57   Temp (!) 96.9 F (36.1 C) (Temporal)   Ht 5\' 5"  (1.651 m)   SpO2 97%   BMI 36.30 kg/m   Physical Exam Constitutional:      Appearance: She is  ill-appearing. She is not toxic-appearing.  HENT:     Head: Normocephalic and atraumatic.     Mouth/Throat:     Mouth: Mucous membranes are moist.  Eyes:     General: No scleral icterus. Cardiovascular:     Rate and Rhythm: Regular rhythm.     Pulses: Normal pulses.     Heart sounds: Normal heart sounds.     Comments: Bradycardia Pulmonary:     Effort: Pulmonary effort is normal.     Breath sounds: Normal breath sounds.  Abdominal:     General: Abdomen is flat. There is no distension.     Palpations: Abdomen is soft.     Tenderness: There is no abdominal tenderness. There is no guarding.  Musculoskeletal:     Cervical back: Neck supple.     Right lower leg: No edema.     Left lower leg: No edema.  Skin:    General: Skin is warm and dry.     Findings: No rash.  Neurological:     General: No focal deficit present.     Mental Status: She is alert and oriented to person, place, and time.  Psychiatric:        Mood and Affect: Mood normal.        Behavior: Behavior normal.    EKG: NSR with with rate of 60 BPM.  Sinus pause noted, no signs of AV block noted.  ------------------------------------------------------------------------------------------------------------------------------------------------------------------------------------------------------------------- Assessment and Plan  Other fatigue Several factors likely contributing to how she is feeling: BP and HR are a little low today. EKG completed and reviewed, NSR with sinus pause.   Will have her reduce toprol to 1/2 tab daily.   It also appears that she has a UTI, will start cephalexin 500mg  BID x7 days.  Urine sent for culture.   Blood sugars not well controlled, new meter sent in to monitor.  Discussed compliance to medications.  Update a1c  Check TSH   Instructed to push fluids.     This visit occurred during the SARS-CoV-2 public health emergency.  Safety protocols were in place, including screening  questions prior to the visit, additional usage of staff PPE, and extensive cleaning of exam room while observing appropriate contact time as indicated for disinfecting solutions.

## 2019-10-20 NOTE — Progress Notes (Signed)
Her UA looks like she may have a UTI as well, I am going to send over antibiotics for her.

## 2019-10-21 ENCOUNTER — Telehealth: Payer: Self-pay | Admitting: Family Medicine

## 2019-10-21 LAB — URINE CULTURE
MICRO NUMBER:: 10056829
SPECIMEN QUALITY:: ADEQUATE

## 2019-10-21 NOTE — Telephone Encounter (Signed)
Patient spouse is calling wanted to speak to someone regarding patient lab results. CB is [phone].

## 2019-10-21 NOTE — Telephone Encounter (Signed)
I spoke with pt and informed her that labs were not resulted yet.  I informed her that we call her once they are back.

## 2019-10-23 ENCOUNTER — Telehealth: Payer: Self-pay | Admitting: Family Medicine

## 2019-10-23 ENCOUNTER — Other Ambulatory Visit: Payer: Self-pay | Admitting: Family Medicine

## 2019-10-23 MED ORDER — ONDANSETRON HCL 4 MG PO TABS: 4.0000 mg | ORAL_TABLET | Freq: Three times a day (TID) | ORAL | 0 refills | Status: DC | PRN

## 2019-10-23 NOTE — Telephone Encounter (Signed)
Dr. Rodena Piety please advise, I believe it is cephalexin 500mg  BID x7 days.

## 2019-10-23 NOTE — Telephone Encounter (Signed)
Zofran sent in

## 2019-10-23 NOTE — Telephone Encounter (Signed)
Patient called and stated that the antibiotic she was given is upsetting her stomach. Please call patient.

## 2019-10-23 NOTE — Telephone Encounter (Signed)
What does she mean by upset stomach?  Nausea? Diarrhea?  Recommend she take with food.

## 2019-10-23 NOTE — Telephone Encounter (Signed)
Pt stated she is taking it with food but she still has nausea. Please advise.   Pt aware you are sending in med.

## 2019-11-02 ENCOUNTER — Other Ambulatory Visit: Payer: Self-pay

## 2019-11-02 ENCOUNTER — Emergency Department (HOSPITAL_BASED_OUTPATIENT_CLINIC_OR_DEPARTMENT_OTHER): Payer: Medicare Other

## 2019-11-02 ENCOUNTER — Emergency Department (HOSPITAL_BASED_OUTPATIENT_CLINIC_OR_DEPARTMENT_OTHER)
Admission: EM | Admit: 2019-11-02 | Discharge: 2019-11-02 | Disposition: A | Discharge status: Home / Self Care | Payer: Medicare Other | Attending: Emergency Medicine | Admitting: Emergency Medicine

## 2019-11-02 ENCOUNTER — Encounter (HOSPITAL_BASED_OUTPATIENT_CLINIC_OR_DEPARTMENT_OTHER): Payer: Self-pay | Admitting: *Deleted

## 2019-11-02 DIAGNOSIS — R42 Dizziness and giddiness: Secondary | ICD-10-CM | POA: Insufficient documentation

## 2019-11-02 DIAGNOSIS — R5383 Other fatigue: Secondary | ICD-10-CM | POA: Diagnosis not present

## 2019-11-02 DIAGNOSIS — I10 Essential (primary) hypertension: Secondary | ICD-10-CM | POA: Diagnosis not present

## 2019-11-02 DIAGNOSIS — R001 Bradycardia, unspecified: Secondary | ICD-10-CM | POA: Diagnosis not present

## 2019-11-02 DIAGNOSIS — C9110 Chronic lymphocytic leukemia of B-cell type not having achieved remission: Secondary | ICD-10-CM | POA: Insufficient documentation

## 2019-11-02 DIAGNOSIS — Z794 Long term (current) use of insulin: Secondary | ICD-10-CM | POA: Insufficient documentation

## 2019-11-02 DIAGNOSIS — E119 Type 2 diabetes mellitus without complications: Secondary | ICD-10-CM | POA: Insufficient documentation

## 2019-11-02 DIAGNOSIS — K449 Diaphragmatic hernia without obstruction or gangrene: Secondary | ICD-10-CM | POA: Diagnosis not present

## 2019-11-02 DIAGNOSIS — R062 Wheezing: Secondary | ICD-10-CM | POA: Diagnosis not present

## 2019-11-02 DIAGNOSIS — Z79899 Other long term (current) drug therapy: Secondary | ICD-10-CM | POA: Diagnosis not present

## 2019-11-02 DIAGNOSIS — R110 Nausea: Secondary | ICD-10-CM | POA: Diagnosis not present

## 2019-11-02 DIAGNOSIS — N281 Cyst of kidney, acquired: Secondary | ICD-10-CM | POA: Diagnosis not present

## 2019-11-02 LAB — COMPREHENSIVE METABOLIC PANEL
ALT: 10 U/L (ref 0–44)
AST: 16 U/L (ref 15–41)
Albumin: 3.4 g/dL — ABNORMAL LOW (ref 3.5–5.0)
Alkaline Phosphatase: 60 U/L (ref 38–126)
Anion gap: 8 (ref 5–15)
BUN: 14 mg/dL (ref 8–23)
CO2: 25 mmol/L (ref 22–32)
Calcium: 10.8 mg/dL — ABNORMAL HIGH (ref 8.9–10.3)
Chloride: 97 mmol/L — ABNORMAL LOW (ref 98–111)
Creatinine, Ser: 1.69 mg/dL — ABNORMAL HIGH (ref 0.44–1.00)
GFR calc Af Amer: 35 mL/min — ABNORMAL LOW (ref >60)
GFR calc non Af Amer: 30 mL/min — ABNORMAL LOW (ref >60)
Glucose, Bld: 233 mg/dL — ABNORMAL HIGH (ref 70–99)
Potassium: 3.5 mmol/L (ref 3.5–5.1)
Sodium: 130 mmol/L — ABNORMAL LOW (ref 135–145)
Total Bilirubin: 0.7 mg/dL (ref 0.3–1.2)
Total Protein: 6.5 g/dL (ref 6.5–8.1)

## 2019-11-02 LAB — URINALYSIS, ROUTINE W REFLEX MICROSCOPIC
Bilirubin Urine: NEGATIVE
Glucose, UA: NEGATIVE mg/dL
Ketones, ur: 15 mg/dL — AB
Nitrite: NEGATIVE
Protein, ur: 30 mg/dL — AB
Specific Gravity, Urine: 1.025 (ref 1.005–1.030)
pH: 5.5 (ref 5.0–8.0)

## 2019-11-02 LAB — CBC WITH DIFFERENTIAL/PLATELET
Abs Immature Granulocytes: 0.16 10*3/uL — ABNORMAL HIGH (ref 0.00–0.07)
Basophils Absolute: 0 10*3/uL (ref 0.0–0.1)
Basophils Relative: 0 %
Eosinophils Absolute: 0 10*3/uL (ref 0.0–0.5)
Eosinophils Relative: 0 %
HCT: 39.5 % (ref 36.0–46.0)
Hemoglobin: 12.2 g/dL (ref 12.0–15.0)
Immature Granulocytes: 2 %
Lymphocytes Relative: 31 %
Lymphs Abs: 3.2 10*3/uL (ref 0.7–4.0)
MCH: 23.6 pg — ABNORMAL LOW (ref 26.0–34.0)
MCHC: 30.9 g/dL (ref 30.0–36.0)
MCV: 76.6 fL — ABNORMAL LOW (ref 80.0–100.0)
Monocytes Absolute: 1.8 10*3/uL — ABNORMAL HIGH (ref 0.1–1.0)
Monocytes Relative: 18 %
Neutro Abs: 5.2 10*3/uL (ref 1.7–7.7)
Neutrophils Relative %: 49 %
Platelets: 322 10*3/uL (ref 150–400)
RBC: 5.16 MIL/uL — ABNORMAL HIGH (ref 3.87–5.11)
RDW: 13.3 % (ref 11.5–15.5)
WBC: 10.4 10*3/uL (ref 4.0–10.5)
nRBC: 0 % (ref 0.0–0.2)

## 2019-11-02 LAB — LIPASE, BLOOD: Lipase: 27 U/L (ref 11–51)

## 2019-11-02 LAB — URINALYSIS, MICROSCOPIC (REFLEX)

## 2019-11-02 LAB — TROPONIN I (HIGH SENSITIVITY)
Troponin I (High Sensitivity): 8 ng/L (ref <18)
Troponin I (High Sensitivity): 8 ng/L (ref <18)

## 2019-11-02 MED ORDER — IBUPROFEN 400 MG PO TABS
600.0000 mg | ORAL_TABLET | Freq: Once | ORAL | Status: AC
  Administered 2019-11-02: 600 mg via ORAL
  Filled 2019-11-02: qty 1

## 2019-11-02 MED ORDER — SODIUM CHLORIDE 0.9 % IV BOLUS
1000.0000 mL | Freq: Once | INTRAVENOUS | Status: AC
  Administered 2019-11-02: 1000 mL via INTRAVENOUS

## 2019-11-02 MED ORDER — IOHEXOL 300 MG/ML  SOLN
100.0000 mL | Freq: Once | INTRAMUSCULAR | Status: AC
  Administered 2019-11-02: 75 mL via INTRAVENOUS

## 2019-11-02 MED ORDER — ONDANSETRON HCL 4 MG/2ML IJ SOLN
4.0000 mg | Freq: Once | INTRAMUSCULAR | Status: AC
  Administered 2019-11-02: 4 mg via INTRAVENOUS
  Filled 2019-11-02: qty 2

## 2019-11-02 NOTE — Discharge Instructions (Signed)
Your labwork and imaging were reassuring today. Your urine did have some bacteria in it but it did not look changed from last week - we have sent it for culture and you will receive a call in 2-3 days if it grows any bacteria concerning for infection  Please follow up with your PCP regarding your ED Visit today

## 2019-11-02 NOTE — ED Provider Notes (Signed)
[facility] EMERGENCY DEPARTMENT Provider Note   CSN: [id number] Arrival date & time: 11/02/19  1341     History Chief Complaint  Patient presents with  . Nausea    Megan Tate is a 74 y.o. female with PMHx CLL (completed treatment with Gazyva/Bendamustine in April 2016 and maintenance Gazyva in June 2018) , diabetes, and HTN who presents to the ED today with husband with complaint of nausea for the past 3 weeks.   Per chart review pt was seen by her oncologist Dr. Marin Olp on 10/19/2019 for complaint of dizziness. Pt has CBC and CMP done with findings of elevated glucose 266. Dr. Marin Olp had ordered CT Soft tissue neck, chest, A/P given her hx of CLL to ensure there were no new findings however patient has not had these done.   She went to her PCP on 1/19 for same. Urinalysis with mild leuks; started on Keflex. Her blood pressure and heart rate were mildly decreased at office with BP 100/60 and pulse 57. PCP decreased her Metoprolol to 1/2 tab daily.   Husband reports that the Keflex upset her stomach and made her more nauseous. Her PCP prescribed Zofran without relief. However patient has not been vomiting. Husband reports he brought her to the ED today because she was very weak and could not get up without assistance which is not her norm. Pt has also been complaining of increased dizziness, no syncope. It does appear that when they saw Dr. Marin Olp for this it has been an ongoing issue for months. Husband denies fever, chills, chest pain, SOB, cough, abdominal pain, vomiting, diarrhea, constipation, blood in stool, urinary frequency, dysuria, or any other associated symptoms. No recent sick contacts or COVID 19 positive exposure.   The history is provided by the patient, the spouse and medical records.       Past Medical History:  Diagnosis Date  . Cancer ([place]) CLL  . CLL (chronic lymphocytic leukemia) ([place]) 08/16/2014  . Diabetes mellitus without complication ([place])   .  Hypertension     Patient Active Problem List   Diagnosis Date Noted  . Other fatigue 10/20/2019  . Dysuria 06/03/2019  . Post-menopausal 05/18/2019  . Gait instability 07/15/2018  . Seasonal allergic rhinitis 07/15/2018  . IDA (iron deficiency anemia) 05/13/2018  . Benign essential hypertension 02/04/2016  . 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 Type 2 diabetes mellitus without complication, with long-term current use of insulin ([place]) 02/04/2016  . Gout 02/04/2016  . Hypercalcemia 02/04/2016  . Obesity, morbid ([place]) 02/04/2016  . Swollen lymph nodes 02/04/2016  . Vaginal yeast infection 09/20/2014  . CLL (chronic lymphocytic leukemia) ([place]) 08/16/2014    No past surgical history on file.   OB History   No obstetric history on file.     No family history on file.  Social History   Tobacco Use  . Smoking status: Never Smoker  . Smokeless tobacco: Never Used  . Tobacco comment: never used tobacco  Substance Use Topics  . Alcohol use: Never    Alcohol/week: 0.0 standard drinks  . Drug use: Never    Home Medications Prior to Admission medications   Medication Sig Start Date End Date Taking? Authorizing Provider  Blood Glucose Monitoring Suppl (ONE TOUCH ULTRA 2) w/Device KIT Use to check blood glucose qid.  Diagnosis: E11.9 10/20/19   Luetta Nutting, DO  Blood Glucose Monitoring Suppl (ONE TOUCH ULTRA SYSTEM KIT) W/DEVICE KIT 1 kit by Does not apply route once. Check blood sugar daily  before meals and at bedtime. 11/19/14   Volanda Napoleon, MD  cloNIDine (CATAPRES) 0.2 MG tablet TAKE 1 TABLET BY MOUTH TWICE A DAY 04/27/19   Luetta Nutting, DO  dorzolamide-timolol (COSOPT) 22.3-6.8 MG/ML ophthalmic solution Place 1 drop into both eyes 2 (two) times daily.  03/18/14   [provider]  doxepin (SINEQUAN) 10 MG capsule Take 1 capsule by mouth daily. 09/07/16   [provider]  Ferrous Sulfate (SLOW RELEASE IRON) 50 MG TBCR Take 1 tablet by mouth daily.  09/29/18   Volanda Napoleon, MD  fluticasone (FLONASE) 50 MCG/ACT nasal spray Place 2 sprays into both nostrils daily. 06/03/18   Jacqlyn Larsen, PA-C  folic acid (FOLVITE) 1 MG tablet TAKE 2 TABLETS (2 MG TOTAL) BY MOUTH DAILY. 10/08/18   Volanda Napoleon, MD  glucose blood (ONE TOUCH ULTRA TEST) test strip USE TO TEST BLOOD SUGAR BEFORE MEALS AND AT BEDTIME 09/30/17   Volanda Napoleon, MD  glucose blood (ONETOUCH ULTRA) test strip Use to check blood glucose qid.  Diagnosis: E11.9 10/20/19   Luetta Nutting, DO  Insulin Pen Needle (BD PEN NEEDLE NANO U/F) 32G X 4 MM MISC Patient is to check blood sugar daily before meals and bedtime. Dx. E11.9 07/23/18   Luetta Nutting, DO  Lancets [facility] DELICA PLUS [id number]) MISC 1 Device by Does not apply route 4 (four) times daily. Use to check blood glucose qid.  Diagnosis: E11.9 10/20/19   Luetta Nutting, DO  LEVEMIR FLEXTOUCH 100 UNIT/ML Pen INJECT 15 UNITS INTO THE SKIN DAILY. **PLEASE SCHEDULE FOLLOW-UP VISIT WITH PRESCRIBER** 08/07/19   Luetta Nutting, DO  lidocaine-prilocaine (EMLA) cream Apply to Port-A-Cath site 1 hour prior to treatment 09/15/18   Volanda Napoleon, MD  LINZESS 145 MCG CAPS capsule Take 1 capsule by mouth daily. 09/10/16   [provider]  losartan (COZAAR) 100 MG tablet TAKE 1 TABLET BY MOUTH EVERY DAY 03/23/19   Luetta Nutting, DO  meclizine (ANTIVERT) 25 MG tablet Take 1 tablet (25 mg total) by mouth 3 (three) times daily as needed for dizziness. 07/28/18   Libby Maw, MD  metoprolol (TOPROL-XL) 200 MG 24 hr tablet Take 0.5 tablets (100 mg total) by mouth daily. 10/20/19   Luetta Nutting, DO  ondansetron (ZOFRAN) 4 MG tablet Take 1 tablet (4 mg total) by mouth every 8 (eight) hours as needed for nausea or vomiting. 10/23/19   Luetta Nutting, DO  rosuvastatin (CRESTOR) 20 MG tablet TAKE 1 TABLET BY MOUTH EVERY DAY 08/04/19   Luetta Nutting, DO  SitaGLIPtin-MetFORMIN HCl (JANUMET XR) 50-1000 MG TB24 Take 2 tablets by mouth  daily. 05/18/19   Luetta Nutting, DO  spironolactone (ALDACTONE) 50 MG tablet TAKE 1 TABLET BY MOUTH EVERY DAY 07/04/19   Volanda Napoleon, MD  tiZANidine (ZANAFLEX) 2 MG tablet Take 1 tablet (2 mg total) by mouth every 8 (eight) hours as needed for muscle spasms. 11/18/17   Volanda Napoleon, MD  UNABLE TO FIND One Touch Ultra Blood Glucose Monitoring Meter. Dx E11.9 10/02/17   Volanda Napoleon, MD    Allergies    Tylenol [acetaminophen]  Review of Systems   Review of Systems  Constitutional: Positive for fatigue. Negative for chills and fever.  Respiratory: Positive for wheezing. Negative for cough and shortness of breath.   Cardiovascular: Negative for chest pain.  Gastrointestinal: Positive for nausea. Negative for abdominal pain, constipation, diarrhea and vomiting.  Genitourinary: Negative for difficulty urinating and dysuria.  Neurological: Positive  for dizziness. Negative for syncope, speech difficulty, weakness, light-headedness, numbness and headaches.  All other systems reviewed and are negative.   Physical Exam Updated Vital Signs BP (!) 146/77 (BP Location: Right Arm)   Pulse 61   Temp 100.2 F (37.9 C) (Oral)   Resp 16   Ht '5\' 4"'  (1.626 m)   Wt 96.2 kg   SpO2 98%   BMI 36.39 kg/m   Physical Exam Vitals and nursing note reviewed.  Constitutional:      Appearance: She is obese. She is not ill-appearing or diaphoretic.  HENT:     Head: Normocephalic and atraumatic.  Eyes:     Extraocular Movements: Extraocular movements intact.     Right eye: No nystagmus.     Left eye: No nystagmus.     Conjunctiva/sclera: Conjunctivae normal.     Pupils: Pupils are equal, round, and reactive to light.  Cardiovascular:     Rate and Rhythm: Regular rhythm. Bradycardia present.     Pulses: Normal pulses.  Pulmonary:     Effort: Pulmonary effort is normal.     Breath sounds: Normal breath sounds. No wheezing, rhonchi or rales.  Chest:     Chest wall: No tenderness.  Abdominal:      Palpations: Abdomen is soft.     Tenderness: There is no abdominal tenderness. There is no right CVA tenderness, left CVA tenderness, guarding or rebound.  Musculoskeletal:        General: No tenderness.     Cervical back: Normal range of motion and neck supple.     Right lower leg: No edema.     Left lower leg: No edema.  Skin:    General: Skin is warm and dry.  Neurological:     Mental Status: She is alert.     Comments: CN 3-12 grossly intact A&O x4 GCS 15 Sensation and strength intact Coordination with finger-to-nose WNL Neg pronator drift     ED Results / Procedures / Treatments   Labs (all labs ordered are listed, but only abnormal results are displayed) Labs Reviewed  URINALYSIS, ROUTINE W REFLEX MICROSCOPIC - Abnormal; Notable for the following components:      Result Value   APPearance CLOUDY (*)    Hgb urine dipstick TRACE (*)    Ketones, ur 15 (*)    Protein, ur 30 (*)    Leukocytes,Ua MODERATE (*)    All other components within normal limits  COMPREHENSIVE METABOLIC PANEL - Abnormal; Notable for the following components:   Sodium 130 (*)    Chloride 97 (*)    Glucose, Bld 233 (*)    Creatinine, Ser 1.69 (*)    Calcium 10.8 (*)    Albumin 3.4 (*)    GFR calc non Af Amer 30 (*)    GFR calc Af Amer 35 (*)    All other components within normal limits  CBC WITH DIFFERENTIAL/PLATELET - Abnormal; Notable for the following components:   RBC 5.16 (*)    MCV 76.6 (*)    MCH 23.6 (*)    Monocytes Absolute 1.8 (*)    Abs Immature Granulocytes 0.16 (*)    All other components within normal limits  URINALYSIS, MICROSCOPIC (REFLEX) - Abnormal; Notable for the following components:   Bacteria, UA FEW (*)    All other components within normal limits  URINE CULTURE  LIPASE, BLOOD  TROPONIN I (HIGH SENSITIVITY)  TROPONIN I (HIGH SENSITIVITY)    EKG EKG Interpretation  Date/Time:  Monday November 02 2019 14:48:06 EST Ventricular Rate:  49 PR Interval:     QRS Duration: 97 QT Interval:  529 QTC Calculation: 478 R Axis:   22 Text Interpretation: Sinus bradycardia Borderline T abnormalities, inferior leads nonspecific T wave changes new since July 2020 Confirmed by Sherwood Gambler [phone]) on 11/02/2019 2:52:12 PM   Radiology CT Abdomen Pelvis W Contrast  Result Date: 11/02/2019 CLINICAL DATA:  Nausea, lightheadedness and wheezing x3 weeks. EXAM: CT ABDOMEN AND PELVIS WITH CONTRAST TECHNIQUE: Multidetector CT imaging of the abdomen and pelvis was performed using the standard protocol following bolus administration of intravenous contrast. CONTRAST:  9m OMNIPAQUE IOHEXOL 300 MG/ML  SOLN COMPARISON:  None. FINDINGS: Lower chest: Mild linear atelectasis is seen within the left lung base. Hepatobiliary: No focal liver abnormality is seen. No gallstones, gallbladder wall thickening, or biliary dilatation. Pancreas: Unremarkable. No pancreatic ductal dilatation or surrounding inflammatory changes. Spleen: Normal in size without focal abnormality. Adrenals/Urinary Tract: Adrenal glands are unremarkable. Kidneys are normal, without renal calculi or hydronephrosis. A 1.2 cm simple cyst is seen along the anterolateral aspect of the mid left kidney. Bladder is unremarkable. Stomach/Bowel: There is a small hiatal hernia. Appendix appears normal. No evidence of bowel wall thickening, distention, or inflammatory changes. Vascular/Lymphatic: Very mild aortic atherosclerosis. No enlarged abdominal or pelvic lymph nodes. Reproductive: Uterus and bilateral adnexa are unremarkable. Other: No abdominal wall hernia or abnormality. No abdominopelvic ascites. Musculoskeletal: No acute or significant osseous findings. IMPRESSION: 1. No acute intra-abdominal findings. 2. Small hiatal hernia. 3. Small left renal cyst. Aortic Atherosclerosis (ICD10-I70.0). Electronically Signed   By: TVirgina NorfolkM.D.   On: 11/02/2019 18:22   DG Chest Port 1 View  Result Date:  11/02/2019 CLINICAL DATA:  Wheezing for 3 weeks. EXAM: PORTABLE CHEST 1 VIEW COMPARISON:  04/25/2019 FINDINGS: Power port in place with the tip just above the cavoatrial junction in good position. Heart size and vascularity are normal. Lungs are clear. No bone abnormality. IMPRESSION: No acute disease. Electronically Signed   By: JLorriane ShireM.D.   On: 11/02/2019 15:24    Procedures Procedures (including critical care time)  Medications Ordered in ED Medications  ibuprofen (ADVIL) tablet 600 mg (600 mg Oral Given 11/02/19 1601)  ondansetron (ZOFRAN) injection 4 mg (4 mg Intravenous Given 11/02/19 1635)  sodium chloride 0.9 % bolus 1,000 mL (1,000 mLs Intravenous New Bag/Given 11/02/19 1715)  iohexol (OMNIPAQUE) 300 MG/ML solution 100 mL (75 mLs Intravenous Contrast Given 11/02/19 1746)    ED Course  I have reviewed the triage vital signs and the nursing notes.  Pertinent labs & imaging results that were available during my care of the patient were reviewed by me and considered in my medical decision making (see chart for details).  Clinical Course as of Nov 02 1843  Mon Nov 02, 2019  1510 Pt refused COVID 19 testing   [MV]    Clinical Course User Index [MV] VEustaquio Maize [place]  74year old female who presents to the ED today complaining of nausea, lightheadedness, wheezing for the past 3 weeks.  Has already been seen by her oncologist Dr. EMarin Olpas well as her PCP Dr. MZigmund Danielfor same.  Treated for UTI last week.  Arrival to the ED patient's rectal temp 100.2.  She is mildly bradycardic in the 60s and 50s.  Nontachypneic.  Work-up for infection today.  Will recheck urine.  Will perform orthostatic vital signs.  Patient without any focal tenderness to her chest, abdomen. No focal neuro deficits  as well. Easily follows commands however patient lets husband do most of the talking as she reports she is "too weak." No signs of nystagmus to suggest any type of vertigo on exam. Do not feel patient  needs imaging of her head today.   CBC with WBC 10.4, Hgb stable.  CMP with sodium 130, glucose 233, creatinine mildly elevated at 1.69; pt receiving fluids.  Lipase negative.  Initial troponin of 8; will repeat.  CXR clear.  Urinalysis cloudy in appearance, ketones and proteins, moderate leuks, and 21-50 WBCs. Pt does have trace hgb on dipstick however none per HPF.   Lab Results  Component Value Date   CREATININE 1.69 (H) 11/02/2019   CREATININE 1.39 (H) 10/19/2019   CREATININE 1.55 (H) 08/31/2019     Per chart review  - urinalysis from last week at PCPs with moderate leuks however urine culture appeared contaminated. Given trace hgb and nausea without obvious source will obtain CT A/P to rule out kidney stones vs pyelo  Pt refuses COVID 19 testing. I had lengthy discussion with patient regarding the fact that this could be causing her symptoms and if we do not find anything obvious today we may not know what is causing her symtpoms. Pt still refusing.   CT scan without any acute findings.   Upon reevaluation pt states she feels improved with IV hydration. Encouraged increase in fluid intake at home. Advised to follow up with PCP. No obvious findings today for patient's nausea; again she has declined COVID testing. Strict return precautions discussed. Pt stable for discharge home.   This note was prepared using Dragon voice recognition software and may include unintentional dictation errors due to the inherent limitations of voice recognition software.    MDM Rules/Calculators/A&P                      Final Clinical Impression(s) / ED Diagnoses Final diagnoses:  Nausea    Rx / DC Orders ED Discharge Orders    None       Discharge Instructions     Your labwork and imaging were reassuring today. Your urine did have some bacteria in it but it did not look changed from last week - we have sent it for culture and you will receive a call in 2-3 days if it grows any bacteria  concerning for infection  Please follow up with your PCP regarding your ED Visit today       Eustaquio Maize, Hershal Coria 11/02/19 1845    Veryl Speak, MD 11/02/19 2043

## 2019-11-02 NOTE — ED Triage Notes (Signed)
C/o nausea, light headed, wheezing x 3 weeks has been seen for same on antibiotic and nausea meds  Pt moaning  Will not answer questions

## 2019-11-02 NOTE — ED Notes (Signed)
PT lied flat for orthostatics while PA at bedside. When returning after PA left room with PT drink the PT was sat up by a family member stating she was nauseated.

## 2019-11-02 NOTE — ED Notes (Signed)
PT given warm blanket and hooked to monitor

## 2019-11-03 LAB — URINE CULTURE

## 2019-11-04 ENCOUNTER — Emergency Department (HOSPITAL_BASED_OUTPATIENT_CLINIC_OR_DEPARTMENT_OTHER)
Admission: EM | Admit: 2019-11-04 | Discharge: 2019-11-04 | Disposition: A | Discharge status: Home / Self Care | Payer: Medicare Other | Attending: Emergency Medicine | Admitting: Emergency Medicine

## 2019-11-04 ENCOUNTER — Encounter (HOSPITAL_BASED_OUTPATIENT_CLINIC_OR_DEPARTMENT_OTHER): Payer: Self-pay | Admitting: *Deleted

## 2019-11-04 ENCOUNTER — Other Ambulatory Visit: Payer: Self-pay

## 2019-11-04 DIAGNOSIS — N390 Urinary tract infection, site not specified: Secondary | ICD-10-CM | POA: Insufficient documentation

## 2019-11-04 DIAGNOSIS — Z856 Personal history of leukemia: Secondary | ICD-10-CM | POA: Insufficient documentation

## 2019-11-04 DIAGNOSIS — R112 Nausea with vomiting, unspecified: Secondary | ICD-10-CM | POA: Diagnosis not present

## 2019-11-04 DIAGNOSIS — E119 Type 2 diabetes mellitus without complications: Secondary | ICD-10-CM | POA: Insufficient documentation

## 2019-11-04 DIAGNOSIS — U071 COVID-19: Secondary | ICD-10-CM | POA: Insufficient documentation

## 2019-11-04 DIAGNOSIS — Z794 Long term (current) use of insulin: Secondary | ICD-10-CM | POA: Insufficient documentation

## 2019-11-04 DIAGNOSIS — R110 Nausea: Secondary | ICD-10-CM | POA: Diagnosis present

## 2019-11-04 DIAGNOSIS — Z79899 Other long term (current) drug therapy: Secondary | ICD-10-CM | POA: Insufficient documentation

## 2019-11-04 DIAGNOSIS — I10 Essential (primary) hypertension: Secondary | ICD-10-CM | POA: Insufficient documentation

## 2019-11-04 DIAGNOSIS — R319 Hematuria, unspecified: Secondary | ICD-10-CM | POA: Diagnosis not present

## 2019-11-04 LAB — URINALYSIS, ROUTINE W REFLEX MICROSCOPIC
Bilirubin Urine: NEGATIVE
Glucose, UA: 100 mg/dL — AB
Ketones, ur: NEGATIVE mg/dL
Nitrite: NEGATIVE
Protein, ur: 100 mg/dL — AB
Specific Gravity, Urine: 1.025 (ref 1.005–1.030)
pH: 6 (ref 5.0–8.0)

## 2019-11-04 LAB — URINALYSIS, MICROSCOPIC (REFLEX)

## 2019-11-04 MED ORDER — ONDANSETRON 4 MG PO TBDP: 4.0000 mg | ORAL_TABLET | Freq: Three times a day (TID) | ORAL | 0 refills | Status: DC | PRN

## 2019-11-04 MED ORDER — CEPHALEXIN 500 MG PO CAPS: 500.0000 mg | ORAL_CAPSULE | Freq: Four times a day (QID) | ORAL | 0 refills | Status: AC

## 2019-11-04 NOTE — ED Notes (Signed)
Pt voided insufficient urine sample. Pt agreed to retry after PO fluids.

## 2019-11-04 NOTE — ED Notes (Addendum)
PT attempted to obtain urine but could not at this time. PT asked to change into a gown.

## 2019-11-04 NOTE — ED Triage Notes (Addendum)
C/o nausea x " 1 month"  Seen here 2 days ago for same and refused covid test

## 2019-11-04 NOTE — ED Provider Notes (Signed)
[facility] Provider Note   CSN: [id number] Arrival date & time: 11/04/19  1527     History Chief Complaint  Patient presents with  . Nausea    Megan Tate is a 74 y.o. female.  Past medical history CLL, diabetes, hypertension presenting to ER with chief complaint of nausea.  States she has been experiencing this now for the past month, has seen multiple providers without any explanation.  States that she has not had any changes in this.  When she came on Monday, they recommended that she obtain a Covid test, at the time she said she did not want it performed, however now wants the Covid test done.  Denies any dysuria or hematuria.  Said that she had a fever at home up to 102 last night.  No associated cough, no vomiting, no abdominal pain, constipation or diarrhea.  HPI     Past Medical History:  Diagnosis Date  . Cancer ([place]) CLL  . CLL (chronic lymphocytic leukemia) ([place]) 08/16/2014  . Diabetes mellitus without complication ([place])   . Hypertension     Patient Active Problem List   Diagnosis Date Noted  . Other fatigue 10/20/2019  . Dysuria 06/03/2019  . Post-menopausal 05/18/2019  . Gait instability 07/15/2018  . Seasonal allergic rhinitis 07/15/2018  . IDA (iron deficiency anemia) 05/13/2018  . Benign essential hypertension 02/04/2016  . 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 Type 2 diabetes mellitus without complication, with long-term current use of insulin ([place]) 02/04/2016  . Gout 02/04/2016  . Hypercalcemia 02/04/2016  . Obesity, morbid ([place]) 02/04/2016  . Swollen lymph nodes 02/04/2016  . Vaginal yeast infection 09/20/2014  . CLL (chronic lymphocytic leukemia) ([place]) 08/16/2014    History reviewed. No pertinent surgical history.   OB History   No obstetric history on file.     History reviewed. No pertinent family history.  Social History   Tobacco Use  . Smoking status: Never Smoker  . Smokeless tobacco: Never  Used  . Tobacco comment: never used tobacco  Substance Use Topics  . Alcohol use: Never    Alcohol/week: 0.0 standard drinks  . Drug use: Never    Home Medications Prior to Admission medications   Medication Sig Start Date End Date Taking? Authorizing Provider  Blood Glucose Monitoring Suppl (ONE TOUCH ULTRA 2) w/Device KIT Use to check blood glucose qid.  Diagnosis: E11.9 10/20/19   Luetta Nutting, DO  Blood Glucose Monitoring Suppl (ONE TOUCH ULTRA SYSTEM KIT) W/DEVICE KIT 1 kit by Does not apply route once. Check blood sugar daily before meals and at bedtime. 11/19/14   Volanda Napoleon, MD  cephALEXin (KEFLEX) 500 MG capsule Take 1 capsule (500 mg total) by mouth 4 (four) times daily for 5 days. 11/04/19 11/09/19  Lucrezia Starch, MD  cloNIDine (CATAPRES) 0.2 MG tablet TAKE 1 TABLET BY MOUTH TWICE A DAY 04/27/19   Luetta Nutting, DO  dorzolamide-timolol (COSOPT) 22.3-6.8 MG/ML ophthalmic solution Place 1 drop into both eyes 2 (two) times daily.  03/18/14   [provider]  doxepin (SINEQUAN) 10 MG capsule Take 1 capsule by mouth daily. 09/07/16   [provider]  Ferrous Sulfate (SLOW RELEASE IRON) 50 MG TBCR Take 1 tablet by mouth daily. 09/29/18   Volanda Napoleon, MD  fluticasone (FLONASE) 50 MCG/ACT nasal spray Place 2 sprays into both nostrils daily. 06/03/18   Jacqlyn Larsen, PA-C  folic acid (FOLVITE) 1 MG tablet TAKE 2 TABLETS (2 MG TOTAL) BY  MOUTH DAILY. 10/08/18   Volanda Napoleon, MD  glucose blood (ONE TOUCH ULTRA TEST) test strip USE TO TEST BLOOD SUGAR BEFORE MEALS AND AT BEDTIME 09/30/17   Volanda Napoleon, MD  glucose blood (ONETOUCH ULTRA) test strip Use to check blood glucose qid.  Diagnosis: E11.9 10/20/19   Luetta Nutting, DO  Insulin Pen Needle (BD PEN NEEDLE NANO U/F) 32G X 4 MM MISC Patient is to check blood sugar daily before meals and bedtime. Dx. E11.9 07/23/18   Luetta Nutting, DO  Lancets [facility] DELICA PLUS [id number]) MISC 1 Device by Does not apply  route 4 (four) times daily. Use to check blood glucose qid.  Diagnosis: E11.9 10/20/19   Luetta Nutting, DO  LEVEMIR FLEXTOUCH 100 UNIT/ML Pen INJECT 15 UNITS INTO THE SKIN DAILY. **PLEASE SCHEDULE FOLLOW-UP VISIT WITH PRESCRIBER** 08/07/19   Luetta Nutting, DO  lidocaine-prilocaine (EMLA) cream Apply to Port-A-Cath site 1 hour prior to treatment 09/15/18   Volanda Napoleon, MD  LINZESS 145 MCG CAPS capsule Take 1 capsule by mouth daily. 09/10/16   [provider]  losartan (COZAAR) 100 MG tablet TAKE 1 TABLET BY MOUTH EVERY DAY 03/23/19   Luetta Nutting, DO  meclizine (ANTIVERT) 25 MG tablet Take 1 tablet (25 mg total) by mouth 3 (three) times daily as needed for dizziness. 07/28/18   Libby Maw, MD  metoprolol (TOPROL-XL) 200 MG 24 hr tablet Take 0.5 tablets (100 mg total) by mouth daily. 10/20/19   Luetta Nutting, DO  ondansetron (ZOFRAN ODT) 4 MG disintegrating tablet Take 1 tablet (4 mg total) by mouth every 8 (eight) hours as needed for nausea or vomiting. 11/04/19   Lucrezia Starch, MD  ondansetron (ZOFRAN) 4 MG tablet Take 1 tablet (4 mg total) by mouth every 8 (eight) hours as needed for nausea or vomiting. 10/23/19   Luetta Nutting, DO  rosuvastatin (CRESTOR) 20 MG tablet TAKE 1 TABLET BY MOUTH EVERY DAY 08/04/19   Luetta Nutting, DO  SitaGLIPtin-MetFORMIN HCl (JANUMET XR) 50-1000 MG TB24 Take 2 tablets by mouth daily. 05/18/19   Luetta Nutting, DO  spironolactone (ALDACTONE) 50 MG tablet TAKE 1 TABLET BY MOUTH EVERY DAY 07/04/19   Volanda Napoleon, MD  tiZANidine (ZANAFLEX) 2 MG tablet Take 1 tablet (2 mg total) by mouth every 8 (eight) hours as needed for muscle spasms. 11/18/17   Volanda Napoleon, MD  UNABLE TO FIND One Touch Ultra Blood Glucose Monitoring Meter. Dx E11.9 10/02/17   Volanda Napoleon, MD    Allergies    Tylenol [acetaminophen]  Review of Systems   Review of Systems  Constitutional: Positive for fever. Negative for chills.  HENT: Negative for ear pain and  sore throat.   Eyes: Negative for pain and visual disturbance.  Respiratory: Negative for cough and shortness of breath.   Cardiovascular: Negative for chest pain and palpitations.  Gastrointestinal: Positive for nausea. Negative for abdominal pain and vomiting.  Genitourinary: Negative for dysuria and hematuria.  Musculoskeletal: Negative for arthralgias and back pain.  Skin: Negative for color change and rash.  Neurological: Negative for seizures and syncope.  All other systems reviewed and are negative.   Physical Exam Updated Vital Signs BP (!) 147/95 (BP Location: Right Arm)   Pulse (!) 52   Temp 98 F (36.7 C)   Resp 18   Ht '5\' 4"'  (1.626 m)   Wt 96 kg   SpO2 100%   BMI 36.33 kg/m   Physical Exam Vitals and nursing  note reviewed.  Constitutional:      General: She is not in acute distress.    Appearance: She is well-developed.  HENT:     Head: Normocephalic and atraumatic.  Eyes:     Conjunctiva/sclera: Conjunctivae normal.  Cardiovascular:     Rate and Rhythm: Normal rate and regular rhythm.     Heart sounds: No murmur.  Pulmonary:     Effort: Pulmonary effort is normal. No respiratory distress.     Breath sounds: Normal breath sounds.  Abdominal:     Palpations: Abdomen is soft.     Tenderness: There is no abdominal tenderness.  Musculoskeletal:        General: No swelling or tenderness.     Cervical back: Neck supple.  Skin:    General: Skin is warm and dry.     Capillary Refill: Capillary refill takes less than 2 seconds.  Neurological:     General: No focal deficit present.     Mental Status: She is alert and oriented to person, place, and time.     ED Results / Procedures / Treatments   Labs (all labs ordered are listed, but only abnormal results are displayed) Labs Reviewed  URINALYSIS, ROUTINE W REFLEX MICROSCOPIC - Abnormal; Notable for the following components:      Result Value   APPearance CLOUDY (*)    Glucose, UA 100 (*)    Hgb urine  dipstick SMALL (*)    Protein, ur 100 (*)    Leukocytes,Ua MODERATE (*)    All other components within normal limits  URINALYSIS, MICROSCOPIC (REFLEX) - Abnormal; Notable for the following components:   Bacteria, UA FEW (*)    All other components within normal limits  URINE CULTURE  NOVEL CORONAVIRUS, NAA (HOSP ORDER, SEND-OUT TO REF LAB; TAT 18-24 HRS)    EKG None  Radiology No results found.  Procedures Procedures (including critical care time)  Medications Ordered in ED Medications - No data to display  ED Course  I have reviewed the triage vital signs and the nursing notes.  Pertinent labs & imaging results that were available during my care of the patient were reviewed by me and considered in my medical decision making (see chart for details).    MDM Rules/Calculators/A&P                      74 year old lady presented to ER with 1 month of nausea.  No change in symptoms since eval 2 days ago.  At that time labs were grossly within normal limits, CT abdomen pelvis was negative.  Urine was equivocal and urine culture was sent.  Today, patient was well-appearing, stable vital signs.  Soft abdomen.  Given age, recommended repeat blood work, patient declined.  Obtained Covid test.  Urine again with possible contaminant versus possible UTI.  Provided Rx for cephalexin, Zofran, sent urine culture.  Recommend close follow-up with primary doctor.    After the discussed management above, the patient was determined to be safe for discharge.  The patient was in agreement with this plan and all questions regarding their care were answered.  ED return precautions were discussed and the patient will return to the ED with any significant worsening of condition.   Final Clinical Impression(s) / ED Diagnoses Final diagnoses:  Nausea  Urinary tract infection with hematuria, site unspecified    Rx / DC Orders ED Discharge Orders         Ordered    cephALEXin (  KEFLEX) 500 MG capsule   4 times daily     11/04/19 1940    ondansetron (ZOFRAN ODT) 4 MG disintegrating tablet  Every 8 hours PRN     11/04/19 1940           Lucrezia Starch, MD 11/04/19 2353

## 2019-11-04 NOTE — Discharge Instructions (Addendum)
Recommend close follow-up with your primary doctor for recheck, ideally to be seen later this week or early next week.  Return to ER if you develop vomiting, abdominal pain, fever or other new concerning symptom.  Would recommend taking the antibiotic as prescribed for your possible UTI.  Culture results will come back in a couple days to verify this.  Take Zofran as needed for nausea.

## 2019-11-05 ENCOUNTER — Other Ambulatory Visit: Payer: Self-pay | Admitting: Hematology & Oncology

## 2019-11-05 DIAGNOSIS — D539 Nutritional anemia, unspecified: Secondary | ICD-10-CM

## 2019-11-05 LAB — URINE CULTURE

## 2019-11-06 LAB — NOVEL CORONAVIRUS, NAA (HOSP ORDER, SEND-OUT TO REF LAB; TAT 18-24 HRS): SARS-CoV-2, NAA: DETECTED — AB

## 2019-11-11 ENCOUNTER — Other Ambulatory Visit: Payer: Self-pay | Admitting: Family Medicine

## 2019-11-21 ENCOUNTER — Other Ambulatory Visit: Payer: Self-pay | Admitting: Family Medicine

## 2019-11-21 DIAGNOSIS — C9110 Chronic lymphocytic leukemia of B-cell type not having achieved remission: Secondary | ICD-10-CM

## 2019-11-23 ENCOUNTER — Inpatient Hospital Stay: Payer: Medicare Other | Attending: Hematology & Oncology

## 2019-11-23 ENCOUNTER — Inpatient Hospital Stay: Payer: Medicare Other

## 2019-11-23 ENCOUNTER — Other Ambulatory Visit: Payer: Self-pay

## 2019-11-23 ENCOUNTER — Encounter: Payer: Self-pay | Admitting: Hematology & Oncology

## 2019-11-23 ENCOUNTER — Other Ambulatory Visit: Payer: Self-pay | Admitting: *Deleted

## 2019-11-23 ENCOUNTER — Inpatient Hospital Stay (HOSPITAL_BASED_OUTPATIENT_CLINIC_OR_DEPARTMENT_OTHER): Payer: Medicare Other | Admitting: Hematology & Oncology

## 2019-11-23 VITALS — BP 154/105 | HR 58 | Temp 97.3°F | Wt 218.0 lb

## 2019-11-23 DIAGNOSIS — E119 Type 2 diabetes mellitus without complications: Secondary | ICD-10-CM

## 2019-11-23 DIAGNOSIS — C9110 Chronic lymphocytic leukemia of B-cell type not having achieved remission: Secondary | ICD-10-CM

## 2019-11-23 DIAGNOSIS — Z452 Encounter for adjustment and management of vascular access device: Secondary | ICD-10-CM | POA: Diagnosis not present

## 2019-11-23 DIAGNOSIS — Z79899 Other long term (current) drug therapy: Secondary | ICD-10-CM | POA: Diagnosis not present

## 2019-11-23 DIAGNOSIS — Z794 Long term (current) use of insulin: Secondary | ICD-10-CM

## 2019-11-23 DIAGNOSIS — Z95828 Presence of other vascular implants and grafts: Secondary | ICD-10-CM

## 2019-11-23 DIAGNOSIS — R399 Unspecified symptoms and signs involving the genitourinary system: Secondary | ICD-10-CM

## 2019-11-23 DIAGNOSIS — R110 Nausea: Secondary | ICD-10-CM | POA: Insufficient documentation

## 2019-11-23 LAB — CBC WITH DIFFERENTIAL (CANCER CENTER ONLY)
Abs Immature Granulocytes: 0.04 10*3/uL (ref 0.00–0.07)
Basophils Absolute: 0 10*3/uL (ref 0.0–0.1)
Basophils Relative: 0 %
Eosinophils Absolute: 0.2 10*3/uL (ref 0.0–0.5)
Eosinophils Relative: 3 %
HCT: 32.8 % — ABNORMAL LOW (ref 36.0–46.0)
Hemoglobin: 10.4 g/dL — ABNORMAL LOW (ref 12.0–15.0)
Immature Granulocytes: 1 %
Lymphocytes Relative: 37 %
Lymphs Abs: 2.5 10*3/uL (ref 0.7–4.0)
MCH: 24.2 pg — ABNORMAL LOW (ref 26.0–34.0)
MCHC: 31.7 g/dL (ref 30.0–36.0)
MCV: 76.3 fL — ABNORMAL LOW (ref 80.0–100.0)
Monocytes Absolute: 1 10*3/uL (ref 0.1–1.0)
Monocytes Relative: 14 %
Neutro Abs: 3.1 10*3/uL (ref 1.7–7.7)
Neutrophils Relative %: 45 %
Platelet Count: 253 10*3/uL (ref 150–400)
RBC: 4.3 MIL/uL (ref 3.87–5.11)
RDW: 15.5 % (ref 11.5–15.5)
WBC Count: 6.7 10*3/uL (ref 4.0–10.5)
nRBC: 0 % (ref 0.0–0.2)

## 2019-11-23 LAB — CMP (CANCER CENTER ONLY)
ALT: 6 U/L (ref 0–44)
AST: 9 U/L — ABNORMAL LOW (ref 15–41)
Albumin: 3.8 g/dL (ref 3.5–5.0)
Alkaline Phosphatase: 70 U/L (ref 38–126)
Anion gap: 8 (ref 5–15)
BUN: 13 mg/dL (ref 8–23)
CO2: 28 mmol/L (ref 22–32)
Calcium: 11 mg/dL — ABNORMAL HIGH (ref 8.9–10.3)
Chloride: 102 mmol/L (ref 98–111)
Creatinine: 1.33 mg/dL — ABNORMAL HIGH (ref 0.44–1.00)
GFR, Est AFR Am: 46 mL/min — ABNORMAL LOW (ref >60)
GFR, Estimated: 40 mL/min — ABNORMAL LOW (ref >60)
Glucose, Bld: 253 mg/dL — ABNORMAL HIGH (ref 70–99)
Potassium: 3.6 mmol/L (ref 3.5–5.1)
Sodium: 138 mmol/L (ref 135–145)
Total Bilirubin: 0.6 mg/dL (ref 0.3–1.2)
Total Protein: 6 g/dL — ABNORMAL LOW (ref 6.5–8.1)

## 2019-11-23 LAB — URINALYSIS, COMPLETE (UACMP) WITH MICROSCOPIC
Bilirubin Urine: NEGATIVE
Glucose, UA: NEGATIVE mg/dL
Ketones, ur: NEGATIVE mg/dL
Nitrite: NEGATIVE
Protein, ur: NEGATIVE mg/dL
Specific Gravity, Urine: 1.02 (ref 1.005–1.030)
WBC, UA: >50 WBC/hpf (ref 0–5)
pH: 6.5 (ref 5.0–8.0)

## 2019-11-23 LAB — LACTATE DEHYDROGENASE: LDH: 134 U/L (ref 98–192)

## 2019-11-23 LAB — HEMOGLOBIN A1C
Hgb A1c MFr Bld: 11.1 % — ABNORMAL HIGH (ref 4.8–5.6)
Mean Plasma Glucose: 271.87 mg/dL

## 2019-11-23 MED ORDER — SODIUM CHLORIDE 0.9% FLUSH
10.0000 mL | INTRAVENOUS | Status: DC | PRN
  Administered 2019-11-23: 10 mL via INTRAVENOUS
  Filled 2019-11-23: qty 10

## 2019-11-23 MED ORDER — HEPARIN SOD (PORK) LOCK FLUSH 100 UNIT/ML IV SOLN
500.0000 [IU] | Freq: Once | INTRAVENOUS | Status: AC
  Administered 2019-11-23: 500 [IU] via INTRAVENOUS
  Filled 2019-11-23: qty 5

## 2019-11-23 NOTE — Progress Notes (Signed)
Hematology and Oncology Follow Up Visit  Megan Tate [id number] 07/24/1946 73 y.o. 11/23/2019   Principle Diagnosis:  Chronic lymphocytic leukemia- Trisomy 12  Past Therapy:             Status post 6 cycles of Gazyva/Bendamustine - completed4/2016 Maintenance Gazyva every 3 months s/p cycle 11 -completed 2 years in June 2018  Current Therapy:   Observation   Interim History:  Megan Tate is here today for follow-up.  Is been a tough day for her.  Unfortunately, one of her sisters, who is 14 years younger than her, passed away from cancer.  This has been quite a stress for Megan Tate.   Of note, she also had the COVID-19 virus.  She was not admitted to the hospital.  She does have a lot of nausea with this.  This was probably about 6 weeks ago.  She does seem to be as dizzy.  She does have some urinary pressure when she goes to the bathroom.  We will send a urinalysis on her.  She has had no fever.  There is been no cough.  She has had no nausea or vomiting.  She has not noted any swollen lymph nodes.  There is been no bleeding.  Of note, she had a CT of the abdomen pelvis back on February 1.  This was unremarkable.  There is no adenopathy.  Overall, her performance status is ECOG 1.     Medications:  Allergies as of 11/23/2019      Reactions   Tylenol [acetaminophen] Other (See Comments)   Messes with stomach      Medication List       Accurate as of November 23, 2019  9:10 AM. If you have any questions, ask your nurse or doctor.        BD Pen Needle Nano U/F 32G X 4 MM Misc Generic drug: Insulin Pen Needle PATIENT IS TO CHECK BLOOD SUGAR DAILY BEFORE MEALS AND BEDTIME. DX. E11.9   cloNIDine 0.2 MG tablet Commonly known as: CATAPRES TAKE 1 TABLET BY MOUTH TWICE A DAY   dorzolamide-timolol 22.3-6.8 MG/ML ophthalmic solution Commonly known as: COSOPT Place 1 drop into both eyes 2 (two) times daily.   doxepin 10 MG capsule Commonly known as: SINEQUAN Take 1  capsule by mouth daily.   Ferrous Sulfate 50 MG Tbcr Commonly known as: Slow Release Iron Take 1 tablet by mouth daily.   fluticasone 50 MCG/ACT nasal spray Commonly known as: FLONASE Place 2 sprays into both nostrils daily.   folic acid 1 MG tablet Commonly known as: FOLVITE TAKE 2 TABLETS BY MOUTH EVERY DAY   Fusion Plus Caps Take 130 mg by mouth every morning.   glucose blood test strip Commonly known as: ONE TOUCH ULTRA TEST USE TO TEST BLOOD SUGAR BEFORE MEALS AND AT BEDTIME   OneTouch Ultra test strip Generic drug: glucose blood Use to check blood glucose qid.  Diagnosis: E11.9   Janumet XR 50-1000 MG Tb24 Generic drug: SitaGLIPtin-MetFORMIN HCl Take 2 tablets by mouth daily.   Levemir FlexTouch 100 UNIT/ML Pen Generic drug: Insulin Detemir INJECT 15 UNITS INTO THE SKIN DAILY. **PLEASE SCHEDULE FOLLOW-UP VISIT WITH PRESCRIBER**   lidocaine-prilocaine cream Commonly known as: EMLA Apply to Port-A-Cath site 1 hour prior to treatment   Linzess 145 MCG Caps capsule Generic drug: linaclotide Take 1 capsule by mouth daily.   losartan 100 MG tablet Commonly known as: COZAAR TAKE 1 TABLET BY MOUTH EVERY DAY   meclizine   25 MG tablet Commonly known as: ANTIVERT Take 1 tablet (25 mg total) by mouth 3 (three) times daily as needed for dizziness.   metoprolol 200 MG 24 hr tablet Commonly known as: TOPROL-XL Take 0.5 tablets (100 mg total) by mouth daily.   ondansetron 4 MG disintegrating tablet Commonly known as: Zofran ODT Take 1 tablet (4 mg total) by mouth every 8 (eight) hours as needed for nausea or vomiting.   ondansetron 4 MG tablet Commonly known as: Zofran Take 1 tablet (4 mg total) by mouth every 8 (eight) hours as needed for nausea or vomiting.   ONE TOUCH ULTRA SYSTEM KIT w/Device Kit 1 kit by Does not apply route once. Check blood sugar daily before meals and at bedtime.   ONE TOUCH ULTRA 2 w/Device Kit Use to check blood glucose qid.  Diagnosis:  V95.6   OneTouch Delica Plus [id number] Misc 1 Device by Does not apply route 4 (four) times daily. Use to check blood glucose qid.  Diagnosis: E11.9   rosuvastatin 20 MG tablet Commonly known as: CRESTOR TAKE 1 TABLET BY MOUTH EVERY DAY   spironolactone 50 MG tablet Commonly known as: ALDACTONE TAKE 1 TABLET BY MOUTH EVERY DAY   tiZANidine 2 MG tablet Commonly known as: ZANAFLEX Take 1 tablet (2 mg total) by mouth every 8 (eight) hours as needed for muscle spasms.   UNABLE TO FIND One Touch Ultra Blood Glucose Monitoring Meter. Dx E11.9       Allergies:  Allergies  Allergen Reactions  . Tylenol [Acetaminophen] Other (See Comments)    Messes with stomach    Past Medical History, Surgical history, Social history, and Family History were reviewed and updated.  Review of Systems: Review of Systems  Constitutional: Negative.   HENT: Negative.   Eyes: Negative.   Respiratory: Negative.   Cardiovascular: Negative.   Gastrointestinal: Negative.   Genitourinary: Negative.   Musculoskeletal: Negative.   Skin: Negative.   Neurological: Negative.   Endo/Heme/Allergies: Negative.   Psychiatric/Behavioral: Negative.      Physical Exam:  weight is 218 lb (98.9 kg). Her oral temperature is 97.3 F (36.3 C) (abnormal). Her blood pressure is 154/105 (abnormal) and her pulse is 58 (abnormal). Her oxygen saturation is 100%.   Wt Readings from Last 3 Encounters:  11/23/19 218 lb (98.9 kg)  11/04/19 211 lb 10.3 oz (96 kg)  11/02/19 212 lb (96.2 kg)    Physical Exam Vitals reviewed.  HENT:     Head: Normocephalic and atraumatic.  Eyes:     Pupils: Pupils are equal, round, and reactive to light.  Cardiovascular:     Rate and Rhythm: Normal rate and regular rhythm.     Heart sounds: Normal heart sounds.  Pulmonary:     Effort: Pulmonary effort is normal.     Breath sounds: Normal breath sounds.  Abdominal:     General: Bowel sounds are normal.     Palpations: Abdomen  is soft.  Musculoskeletal:        General: No tenderness or deformity. Normal range of motion.     Cervical back: Normal range of motion.  Lymphadenopathy:     Cervical: No cervical adenopathy.  Skin:    General: Skin is warm and dry.     Findings: No erythema or rash.  Neurological:     Mental Status: She is alert and oriented to person, place, and time.  Psychiatric:        Behavior: Behavior normal.  Thought Content: Thought content normal.        Judgment: Judgment normal.      Lab Results  Component Value Date   WBC 10.4 11/02/2019   HGB 12.2 11/02/2019   HCT 39.5 11/02/2019   MCV 76.6 (L) 11/02/2019   PLT 322 11/02/2019   Lab Results  Component Value Date   FERRITIN 442 (H) 07/31/2018   IRON 88 07/31/2018   TIBC 253 07/31/2018   UIBC 165 07/31/2018   IRONPCTSAT 35 07/31/2018   Lab Results  Component Value Date   RETICCTPCT 1.1 05/08/2018   RBC 5.16 (H) 11/02/2019   RETICCTABS 65.4 04/07/2014   Lab Results  Component Value Date   KPAFRELGTCHN 14.1 03/11/2019   LAMBDASER 5.5 (L) 03/11/2019   KAPLAMBRATIO 2.56 (H) 03/11/2019   Lab Results  Component Value Date   IGGSERUM 580 (L) 03/11/2019   IGA 36 (L) 03/11/2019   IGMSERUM 8 (L) 03/11/2019   Lab Results  Component Value Date   TOTALPROTELP 6.1 03/11/2019   ALBUMINELP 3.7 03/11/2019   A1GS 0.2 03/11/2019   A2GS 0.9 03/11/2019   BETS 0.8 03/11/2019   BETA2SER 0.2 03/30/2015   GAMS 0.5 03/11/2019   MSPIKE Not Observed 03/11/2019   SPEI * 03/30/2015     Chemistry      Component Value Date/Time   NA 130 (L) 11/02/2019 1411   NA 142 09/05/2017 0751   K 3.5 11/02/2019 1411   K 3.8 09/05/2017 0751   CL 97 (L) 11/02/2019 1411   CL 104 09/05/2017 0751   CO2 25 11/02/2019 1411   CO2 27 09/05/2017 0751   BUN 14 11/02/2019 1411   BUN 11 09/05/2017 0751   CREATININE 1.69 (H) 11/02/2019 1411   CREATININE 1.39 (H) 10/19/2019 0912   CREATININE 1.1 09/05/2017 0751      Component Value  Date/Time   CALCIUM 10.8 (H) 11/02/2019 1411   CALCIUM 10.8 (H) 09/05/2017 0751   ALKPHOS 60 11/02/2019 1411   ALKPHOS 88 (H) 09/05/2017 0751   AST 16 11/02/2019 1411   AST 11 (L) 10/19/2019 0912   ALT 10 11/02/2019 1411   ALT 7 10/19/2019 0912   ALT 14 09/05/2017 0751   BILITOT 0.7 11/02/2019 1411   BILITOT 0.7 10/19/2019 0912      Impression and Plan: Megan Tate is a pleasant 73 yo African American female with chronic lymphocytic leukemia. She completed treatment with Gazyva/Bendamustine in April 2016 and maintenance Gazyva in June 2018.   I am so sorry about her sister.  I am sure that this will be tough for quite a while for Megan Tate.  We will see what the urinalysis shows.  Hopefully there is no urinary tract infection.  If there is, we will get her on antibiotics.  We will have her come back in another 6 weeks.  She likes to come back every 6 weeks to have her Port-A-Cath flushed.    Peter R Ennever, MD 2/22/20219:10 AM 

## 2019-11-23 NOTE — Telephone Encounter (Signed)
Refill request, previous patient of Dr. Zigmund Daniel last visit 10/20/19. Pending medication for approval or denial. Please advise.

## 2019-11-23 NOTE — Patient Instructions (Signed)
Implanted Port Insertion, Care After °This sheet gives you information about how to care for yourself after your procedure. Your health care provider may also give you more specific instructions. If you have problems or questions, contact your health care provider. °What can I expect after the procedure? °After the procedure, it is common to have: °· Discomfort at the port insertion site. °· Bruising on the skin over the port. This should improve over 3-4 days. °Follow these instructions at home: °Port care °· After your port is placed, you will get a manufacturer's information card. The card has information about your port. Keep this card with you at all times. °· Take care of the port as told by your health care provider. Ask your health care provider if you or a family member can get training for taking care of the port at home. A home health care nurse may also take care of the port. °· Make sure to remember what type of port you have. °Incision care ° °  ° °· Follow instructions from your health care provider about how to take care of your port insertion site. Make sure you: °? Wash your hands with soap and water before and after you change your bandage (dressing). If soap and water are not available, use hand sanitizer. °? Change your dressing as told by your health care provider. °? Leave stitches (sutures), skin glue, or adhesive strips in place. These skin closures may need to stay in place for 2 weeks or longer. If adhesive strip edges start to loosen and curl up, you may trim the loose edges. Do not remove adhesive strips completely unless your health care provider tells you to do that. °· Check your port insertion site every day for signs of infection. Check for: °? Redness, swelling, or pain. °? Fluid or blood. °? Warmth. °? Pus or a bad smell. °Activity °· Return to your normal activities as told by your health care provider. Ask your health care provider what activities are safe for you. °· Do not  lift anything that is heavier than 10 lb (4.5 kg), or the limit that you are told, until your health care provider says that it is safe. °General instructions °· Take over-the-counter and prescription medicines only as told by your health care provider. °· Do not take baths, swim, or use a hot tub until your health care provider approves. Ask your health care provider if you may take showers. You may only be allowed to take sponge baths. °· Do not drive for 24 hours if you were given a sedative during your procedure. °· Wear a medical alert bracelet in case of an emergency. This will tell any health care providers that you have a port. °· Keep all follow-up visits as told by your health care provider. This is important. °Contact a health care provider if: °· You cannot flush your port with saline as directed, or you cannot draw blood from the port. °· You have a fever or chills. °· You have redness, swelling, or pain around your port insertion site. °· You have fluid or blood coming from your port insertion site. °· Your port insertion site feels warm to the touch. °· You have pus or a bad smell coming from the port insertion site. °Get help right away if: °· You have chest pain or shortness of breath. °· You have bleeding from your port that you cannot control. °Summary °· Take care of the port as told by your health   care provider. Keep the manufacturer's information card with you at all times. °· Change your dressing as told by your health care provider. °· Contact a health care provider if you have a fever or chills or if you have redness, swelling, or pain around your port insertion site. °· Keep all follow-up visits as told by your health care provider. °This information is not intended to replace advice given to you by your health care provider. Make sure you discuss any questions you have with your health care provider. °Document Revised: 04/15/2018 Document Reviewed: 04/15/2018 °Elsevier Patient Education ©  2020 [employer] ° °

## 2019-11-24 ENCOUNTER — Telehealth: Payer: Self-pay | Admitting: *Deleted

## 2019-11-24 LAB — URINE CULTURE: Culture: <10000 — AB

## 2019-11-24 LAB — IGG, IGA, IGM
IgA: 26 mg/dL — ABNORMAL LOW (ref 64–422)
IgG (Immunoglobin G), Serum: 449 mg/dL — ABNORMAL LOW (ref 586–1602)
IgM (Immunoglobulin M), Srm: <5 mg/dL — ABNORMAL LOW (ref 26–217)

## 2019-11-24 LAB — KAPPA/LAMBDA LIGHT CHAINS
Kappa free light chain: 11.6 mg/L (ref 3.3–19.4)
Kappa, lambda light chain ratio: 2.58 — ABNORMAL HIGH (ref 0.26–1.65)
Lambda free light chains: 4.5 mg/L — ABNORMAL LOW (ref 5.7–26.3)

## 2019-11-24 LAB — BETA 2 MICROGLOBULIN, SERUM: Beta-2 Microglobulin: 4 mg/L — ABNORMAL HIGH (ref 0.6–2.4)

## 2019-11-24 MED ORDER — SULFAMETHOXAZOLE-TRIMETHOPRIM 800-160 MG PO TABS: 1.0000 | ORAL_TABLET | Freq: Two times a day (BID) | ORAL | 0 refills | Status: DC

## 2019-11-24 NOTE — Telephone Encounter (Signed)
-----   Message from Volanda Napoleon, MD sent at 11/23/2019  5:10 PM EST ----- Call - the Hemoglobin A1C is 11!!  This is way too high.  You WILL develop permanent problems from your diabetes if the blood sugar is not better controlled.    Please send this result to her primary MD.  Megan Tate

## 2019-11-24 NOTE — Telephone Encounter (Signed)
Message left to instruct pt to call office back for MD instructions.

## 2019-11-24 NOTE — Addendum Note (Signed)
Addended by: Volanda Napoleon on: 11/24/2019 02:11 PM   Modules accepted: Orders

## 2019-11-25 ENCOUNTER — Telehealth: Payer: Self-pay

## 2019-11-25 NOTE — Telephone Encounter (Addendum)
Attached message given to pt via phone. Pt verbalizes importance of following up with PCP for instructions. Also aware of antibiotics called in for UTI. dph  ----- Message from Volanda Napoleon, MD sent at 11/23/2019  5:10 PM EST ----- Call - the Hemoglobin A1C is 11!!  This is way too high.  You WILL develop permanent problems from your diabetes if the blood sugar is not better controlled.    Please send this result to her primary MD.  Megan Tate

## 2019-12-18 ENCOUNTER — Other Ambulatory Visit: Payer: Self-pay | Admitting: Family Medicine

## 2019-12-18 DIAGNOSIS — C9110 Chronic lymphocytic leukemia of B-cell type not having achieved remission: Secondary | ICD-10-CM

## 2019-12-18 NOTE — Telephone Encounter (Signed)
/  Refill request for Lasartan 100 mg patient former pt of Dr. Zigmund Daniel. Called patient to see if she plan on relocating with Dr. Zigmund Daniel or if she was going to stay at [facility] and [facility] with one of the providers, patient states that she wiill stay at [facility] and give [place] a call to schedule TOC here. Rx refilled last visit 10/20/19

## 2019-12-29 ENCOUNTER — Telehealth: Payer: Self-pay | Admitting: Family Medicine

## 2019-12-29 NOTE — Telephone Encounter (Signed)
Received refill request from [facility] for BD NANO 2 GEN PEN NDL 32X4MM--used as directed to injecti insuline twice daily.   Pharmacy also mention that Levemir increase dosage to 20 unites twice daily. Need new rx with new direction--per pt.    LVM for the pt to call back, unable to find documentation support the change of Levemir.

## 2019-12-30 ENCOUNTER — Telehealth: Payer: Self-pay | Admitting: Family Medicine

## 2019-12-30 MED ORDER — BD PEN NEEDLE NANO U/F 32G X 4 MM MISC: 3 refills | Status: DC

## 2019-12-30 MED ORDER — LEVEMIR FLEXTOUCH 100 UNIT/ML ~~LOC~~ SOPN: 20.0000 [IU] | PEN_INJECTOR | Freq: Every day | SUBCUTANEOUS | 4 refills | Status: DC

## 2019-12-30 NOTE — Telephone Encounter (Signed)
Rx updated and sent to pharm. Needles refilled as requested.

## 2019-12-30 NOTE — Telephone Encounter (Signed)
Rx sent in per Dr.C

## 2019-12-30 NOTE — Telephone Encounter (Signed)
[facility] calling saying that pt said she had increased using Insulin Pen Needle (BD PEN NEEDLE NANO U/F) 32G X 4 MM MISC to twice a day and they just need an updated script, I did let them know that the provider is not at this location anymore but I would send a note to see what could be done. Sending to doc of the day

## 2019-12-30 NOTE — Telephone Encounter (Signed)
Dr. Loletha Grayer please help  Pt/pharmacy request new rx for Levemir with 20 unite once daily sent into pharmacy for insurance purposes. Pt stated Dr. Zigmund Daniel changed to 20 unite in 10/2019 (last ov) but unable to find documentation to support this. Last A1C checked 11/2019--11.1. Pt also request to update pen needles sig as well.   [facility]   FYI--inform the pt that she needs to est with new PCP before they hung up the phone.

## 2020-01-04 ENCOUNTER — Inpatient Hospital Stay: Payer: Medicare Other | Attending: Hematology & Oncology | Admitting: Hematology & Oncology

## 2020-01-04 ENCOUNTER — Inpatient Hospital Stay: Payer: Medicare Other

## 2020-01-04 ENCOUNTER — Other Ambulatory Visit: Payer: Self-pay

## 2020-01-04 VITALS — BP 203/84 | HR 51 | Temp 96.2°F

## 2020-01-04 DIAGNOSIS — Z79899 Other long term (current) drug therapy: Secondary | ICD-10-CM | POA: Insufficient documentation

## 2020-01-04 DIAGNOSIS — C9110 Chronic lymphocytic leukemia of B-cell type not having achieved remission: Secondary | ICD-10-CM

## 2020-01-04 DIAGNOSIS — D508 Other iron deficiency anemias: Secondary | ICD-10-CM

## 2020-01-04 LAB — CBC WITH DIFFERENTIAL (CANCER CENTER ONLY)
Abs Immature Granulocytes: 0.02 10*3/uL (ref 0.00–0.07)
Basophils Absolute: 0 10*3/uL (ref 0.0–0.1)
Basophils Relative: 1 %
Eosinophils Absolute: 0.2 10*3/uL (ref 0.0–0.5)
Eosinophils Relative: 3 %
HCT: 34.5 % — ABNORMAL LOW (ref 36.0–46.0)
Hemoglobin: 11 g/dL — ABNORMAL LOW (ref 12.0–15.0)
Immature Granulocytes: 0 %
Lymphocytes Relative: 41 %
Lymphs Abs: 2.6 10*3/uL (ref 0.7–4.0)
MCH: 24.4 pg — ABNORMAL LOW (ref 26.0–34.0)
MCHC: 31.9 g/dL (ref 30.0–36.0)
MCV: 76.5 fL — ABNORMAL LOW (ref 80.0–100.0)
Monocytes Absolute: 0.9 10*3/uL (ref 0.1–1.0)
Monocytes Relative: 15 %
Neutro Abs: 2.5 10*3/uL (ref 1.7–7.7)
Neutrophils Relative %: 40 %
Platelet Count: 200 10*3/uL (ref 150–400)
RBC: 4.51 MIL/uL (ref 3.87–5.11)
RDW: 14.6 % (ref 11.5–15.5)
WBC Count: 6.2 10*3/uL (ref 4.0–10.5)
nRBC: 0 % (ref 0.0–0.2)

## 2020-01-04 LAB — CMP (CANCER CENTER ONLY)
ALT: 8 U/L (ref 0–44)
AST: 10 U/L — ABNORMAL LOW (ref 15–41)
Albumin: 3.8 g/dL (ref 3.5–5.0)
Alkaline Phosphatase: 125 U/L (ref 38–126)
Anion gap: 7 (ref 5–15)
BUN: 11 mg/dL (ref 8–23)
CO2: 28 mmol/L (ref 22–32)
Calcium: 10.5 mg/dL — ABNORMAL HIGH (ref 8.9–10.3)
Chloride: 101 mmol/L (ref 98–111)
Creatinine: 1.26 mg/dL — ABNORMAL HIGH (ref 0.44–1.00)
GFR, Est AFR Am: 49 mL/min — ABNORMAL LOW (ref >60)
GFR, Estimated: 43 mL/min — ABNORMAL LOW (ref >60)
Glucose, Bld: 307 mg/dL — ABNORMAL HIGH (ref 70–99)
Potassium: 3.6 mmol/L (ref 3.5–5.1)
Sodium: 136 mmol/L (ref 135–145)
Total Bilirubin: 0.6 mg/dL (ref 0.3–1.2)
Total Protein: 5.5 g/dL — ABNORMAL LOW (ref 6.5–8.1)

## 2020-01-04 LAB — LACTATE DEHYDROGENASE: LDH: 111 U/L (ref 98–192)

## 2020-01-04 MED ORDER — SODIUM CHLORIDE 0.9% FLUSH
10.0000 mL | INTRAVENOUS | Status: DC | PRN
  Administered 2020-01-04: 10 mL
  Filled 2020-01-04: qty 10

## 2020-01-04 MED ORDER — LIDOCAINE-PRILOCAINE 2.5-2.5 % EX CREA: TOPICAL_CREAM | CUTANEOUS | 4 refills | Status: DC

## 2020-01-04 MED ORDER — HEPARIN SOD (PORK) LOCK FLUSH 100 UNIT/ML IV SOLN
500.0000 [IU] | Freq: Once | INTRAVENOUS | Status: AC | PRN
  Administered 2020-01-04: 500 [IU]
  Filled 2020-01-04: qty 5

## 2020-01-04 NOTE — Progress Notes (Signed)
Hematology and Oncology Follow Up Visit  Megan Tate [id number] 26-May-1946 74 y.o. 01/04/2020   Principle Diagnosis:  Chronic lymphocytic leukemia- Trisomy 12  Past Therapy:             Status post 6 cycles of Gazyva/Bendamustine - completed4/2016 Maintenance Gazyva every 3 months s/p cycle 11 -completed 2 years in June 2018  Current Therapy:   Observation   Interim History:  Megan Tate is here today for follow-up.  So far, she is doing pretty well.  She had a very nice Easter weekend.  She really has had no specific complaints.  She has had no problems with fever.  Is been no cough or shortness of breath.  She has had no nausea or vomiting.  She has had no change in bowel or bladder habits.  She says her blood sugar is still not all that well controlled.  Her blood pressure is up a little bit today.  It was tends to be high when she is here.  She has had no bleeding.  There is no bruising.  She has had no leg swelling.   Overall, her performance status is ECOG 1.     Medications:  Allergies as of 01/04/2020      Reactions   Tylenol [acetaminophen] Other (See Comments)   Messes with stomach      Medication List       Accurate as of January 04, 2020  8:50 AM. If you have any questions, ask your nurse or doctor.        BD Pen Needle Nano U/F 32G X 4 MM Misc Generic drug: Insulin Pen Needle PATIENT IS TO CHECK BLOOD SUGAR DAILY BEFORE MEALS AND BEDTIME. DX. E11.9   cloNIDine 0.2 MG tablet Commonly known as: CATAPRES TAKE 1 TABLET BY MOUTH TWICE A DAY   dorzolamide-timolol 22.3-6.8 MG/ML ophthalmic solution Commonly known as: COSOPT Place 1 drop into both eyes 2 (two) times daily.   doxepin 10 MG capsule Commonly known as: SINEQUAN Take 1 capsule by mouth daily.   Ferrous Sulfate 50 MG Tbcr Commonly known as: Slow Release Iron Take 1 tablet by mouth daily.   fluticasone 50 MCG/ACT nasal spray Commonly known as: FLONASE Place 2 sprays into both nostrils  daily.   folic acid 1 MG tablet Commonly known as: FOLVITE TAKE 2 TABLETS BY MOUTH EVERY DAY   Fusion Plus Caps Take 130 mg by mouth every morning.   glucose blood test strip Commonly known as: ONE TOUCH ULTRA TEST USE TO TEST BLOOD SUGAR BEFORE MEALS AND AT BEDTIME   OneTouch Ultra test strip Generic drug: glucose blood Use to check blood glucose qid.  Diagnosis: E11.9   Janumet XR 50-1000 MG Tb24 Generic drug: SitaGLIPtin-MetFORMIN HCl TAKE 2 TABLETS BY MOUTH EVERY DAY   Levemir FlexTouch 100 UNIT/ML FlexPen Generic drug: insulin detemir Inject 20 Units into the skin daily.   lidocaine-prilocaine cream Commonly known as: EMLA Apply to Port-A-Cath site 1 hour prior to treatment   Linzess 145 MCG Caps capsule Generic drug: linaclotide Take 1 capsule by mouth daily.   losartan 100 MG tablet Commonly known as: COZAAR TAKE 1 TABLET BY MOUTH ONCE DAILY   meclizine 25 MG tablet Commonly known as: ANTIVERT Take 1 tablet (25 mg total) by mouth 3 (three) times daily as needed for dizziness.   metoprolol 200 MG 24 hr tablet Commonly known as: TOPROL-XL Take 0.5 tablets (100 mg total) by mouth daily.   ondansetron 4 MG disintegrating tablet  Commonly known as: Zofran ODT Take 1 tablet (4 mg total) by mouth every 8 (eight) hours as needed for nausea or vomiting.   ondansetron 4 MG tablet Commonly known as: Zofran Take 1 tablet (4 mg total) by mouth every 8 (eight) hours as needed for nausea or vomiting.   ONE TOUCH ULTRA SYSTEM KIT w/Device Kit 1 kit by Does not apply route once. Check blood sugar daily before meals and at bedtime.   ONE TOUCH ULTRA 2 w/Device Kit Use to check blood glucose qid.  Diagnosis: C94.4   OneTouch Delica Plus [id number] Misc 1 Device by Does not apply route 4 (four) times daily. Use to check blood glucose qid.  Diagnosis: E11.9   rosuvastatin 20 MG tablet Commonly known as: CRESTOR TAKE 1 TABLET BY MOUTH EVERY DAY   spironolactone 50 MG  tablet Commonly known as: ALDACTONE TAKE 1 TABLET BY MOUTH EVERY DAY   sulfamethoxazole-trimethoprim 800-160 MG tablet Commonly known as: BACTRIM DS Take 1 tablet by mouth 2 (two) times daily.   tiZANidine 2 MG tablet Commonly known as: ZANAFLEX Take 1 tablet (2 mg total) by mouth every 8 (eight) hours as needed for muscle spasms.   UNABLE TO FIND One Touch Ultra Blood Glucose Monitoring Meter. Dx E11.9       Allergies:  Allergies  Allergen Reactions  . Tylenol [Acetaminophen] Other (See Comments)    Messes with stomach    Past Medical History, Surgical history, Social history, and Family History were reviewed and updated.  Review of Systems: Review of Systems  Constitutional: Negative.   HENT: Negative.   Eyes: Negative.   Respiratory: Negative.   Cardiovascular: Negative.   Gastrointestinal: Negative.   Genitourinary: Negative.   Musculoskeletal: Negative.   Skin: Negative.   Neurological: Negative.   Endo/Heme/Allergies: Negative.   Psychiatric/Behavioral: Negative.      Physical Exam:  temporal temperature is 96.2 F (35.7 C) (abnormal). Her blood pressure is 203/84 (abnormal) and her pulse is 51 (abnormal). Her oxygen saturation is 100%.   Wt Readings from Last 3 Encounters:  11/23/19 218 lb (98.9 kg)  11/04/19 211 lb 10.3 oz (96 kg)  11/02/19 212 lb (96.2 kg)    Physical Exam Vitals reviewed.  HENT:     Head: Normocephalic and atraumatic.  Eyes:     Pupils: Pupils are equal, round, and reactive to light.  Cardiovascular:     Rate and Rhythm: Normal rate and regular rhythm.     Heart sounds: Normal heart sounds.  Pulmonary:     Effort: Pulmonary effort is normal.     Breath sounds: Normal breath sounds.  Abdominal:     General: Bowel sounds are normal.     Palpations: Abdomen is soft.  Musculoskeletal:        General: No tenderness or deformity. Normal range of motion.     Cervical back: Normal range of motion.  Lymphadenopathy:      Cervical: No cervical adenopathy.  Skin:    General: Skin is warm and dry.     Findings: No erythema or rash.  Neurological:     Mental Status: She is alert and oriented to person, place, and time.  Psychiatric:        Behavior: Behavior normal.        Thought Content: Thought content normal.        Judgment: Judgment normal.      Lab Results  Component Value Date   WBC 6.2 01/04/2020   HGB 11.0 (L)  01/04/2020   HCT 34.5 (L) 01/04/2020   MCV 76.5 (L) 01/04/2020   PLT 200 01/04/2020   Lab Results  Component Value Date   FERRITIN 442 (H) 07/31/2018   IRON 88 07/31/2018   TIBC 253 07/31/2018   UIBC 165 07/31/2018   IRONPCTSAT 35 07/31/2018   Lab Results  Component Value Date   RETICCTPCT 1.1 05/08/2018   RBC 4.51 01/04/2020   RETICCTABS 65.4 04/07/2014   Lab Results  Component Value Date   KPAFRELGTCHN 11.6 11/23/2019   LAMBDASER 4.5 (L) 11/23/2019   KAPLAMBRATIO 2.58 (H) 11/23/2019   Lab Results  Component Value Date   IGGSERUM 449 (L) 11/23/2019   IGA 26 (L) 11/23/2019   IGMSERUM <5 (L) 11/23/2019   Lab Results  Component Value Date   TOTALPROTELP 6.1 03/11/2019   ALBUMINELP 3.7 03/11/2019   A1GS 0.2 03/11/2019   A2GS 0.9 03/11/2019   BETS 0.8 03/11/2019   BETA2SER 0.2 03/30/2015   GAMS 0.5 03/11/2019   MSPIKE Not Observed 03/11/2019   SPEI * 03/30/2015     Chemistry      Component Value Date/Time   NA 138 11/23/2019 0850   NA 142 09/05/2017 0751   K 3.6 11/23/2019 0850   K 3.8 09/05/2017 0751   CL 102 11/23/2019 0850   CL 104 09/05/2017 0751   CO2 28 11/23/2019 0850   CO2 27 09/05/2017 0751   BUN 13 11/23/2019 0850   BUN 11 09/05/2017 0751   CREATININE 1.33 (H) 11/23/2019 0850   CREATININE 1.1 09/05/2017 0751      Component Value Date/Time   CALCIUM 11.0 (H) 11/23/2019 0850   CALCIUM 10.8 (H) 09/05/2017 0751   ALKPHOS 70 11/23/2019 0850   ALKPHOS 88 (H) 09/05/2017 0751   AST 9 (L) 11/23/2019 0850   ALT 6 11/23/2019 0850   ALT 14  09/05/2017 0751   BILITOT 0.6 11/23/2019 0850      Impression and Plan: Ms. Ringle is a pleasant 74 yo African American female with chronic lymphocytic leukemia. She completed treatment with Gazyva/Bendamustine in April 2016 and maintenance Gazyva in June 2018.   At this point, she just wants to have blood work every 3 months.  As such, we will see her every 3 months.  I still believe that her overall prognosis is clearly related to diabetes.  Volanda Napoleon, MD 4/5/20218:50 AM

## 2020-01-04 NOTE — Addendum Note (Signed)
Addended by: Burney Gauze R on: 01/04/2020 03:17 PM   Modules accepted: Orders

## 2020-01-05 LAB — IGG, IGA, IGM
IgA: 23 mg/dL — ABNORMAL LOW (ref 64–422)
IgG (Immunoglobin G), Serum: 465 mg/dL — ABNORMAL LOW (ref 586–1602)
IgM (Immunoglobulin M), Srm: 6 mg/dL — ABNORMAL LOW (ref 26–217)

## 2020-01-05 LAB — KAPPA/LAMBDA LIGHT CHAINS
Kappa free light chain: 12.7 mg/L (ref 3.3–19.4)
Kappa, lambda light chain ratio: 2.15 — ABNORMAL HIGH (ref 0.26–1.65)
Lambda free light chains: 5.9 mg/L (ref 5.7–26.3)

## 2020-01-06 ENCOUNTER — Other Ambulatory Visit: Payer: Self-pay

## 2020-01-06 LAB — PROTEIN ELECTROPHORESIS, SERUM, WITH REFLEX
A/G Ratio: 1.4 (ref 0.7–1.7)
Albumin ELP: 3.2 g/dL (ref 2.9–4.4)
Alpha-1-Globulin: 0.2 g/dL (ref 0.0–0.4)
Alpha-2-Globulin: 0.8 g/dL (ref 0.4–1.0)
Beta Globulin: 0.8 g/dL (ref 0.7–1.3)
Gamma Globulin: 0.4 g/dL (ref 0.4–1.8)
Globulin, Total: 2.3 g/dL (ref 2.2–3.9)
Total Protein ELP: 5.5 g/dL — ABNORMAL LOW (ref 6.0–8.5)

## 2020-01-06 MED ORDER — FLUCONAZOLE 100 MG PO TABS: 100.0000 mg | ORAL_TABLET | Freq: Every day | ORAL | 1 refills | Status: DC

## 2020-01-11 ENCOUNTER — Other Ambulatory Visit: Payer: Self-pay | Admitting: Family Medicine

## 2020-01-11 DIAGNOSIS — C9110 Chronic lymphocytic leukemia of B-cell type not having achieved remission: Secondary | ICD-10-CM

## 2020-01-14 ENCOUNTER — Other Ambulatory Visit: Payer: Self-pay | Admitting: Family Medicine

## 2020-02-02 ENCOUNTER — Other Ambulatory Visit: Payer: Self-pay | Admitting: Family Medicine

## 2020-02-15 ENCOUNTER — Other Ambulatory Visit: Payer: Self-pay

## 2020-02-15 ENCOUNTER — Inpatient Hospital Stay: Payer: Medicare Other | Attending: Hematology & Oncology

## 2020-02-15 DIAGNOSIS — Z452 Encounter for adjustment and management of vascular access device: Secondary | ICD-10-CM | POA: Insufficient documentation

## 2020-02-15 DIAGNOSIS — C9110 Chronic lymphocytic leukemia of B-cell type not having achieved remission: Secondary | ICD-10-CM | POA: Diagnosis not present

## 2020-02-15 DIAGNOSIS — Z95828 Presence of other vascular implants and grafts: Secondary | ICD-10-CM

## 2020-02-15 MED ORDER — SODIUM CHLORIDE 0.9% FLUSH
10.0000 mL | INTRAVENOUS | Status: DC | PRN
  Administered 2020-02-15: 10 mL via INTRAVENOUS
  Filled 2020-02-15: qty 10

## 2020-02-15 MED ORDER — HEPARIN SOD (PORK) LOCK FLUSH 100 UNIT/ML IV SOLN
500.0000 [IU] | Freq: Once | INTRAVENOUS | Status: AC
  Administered 2020-02-15: 500 [IU] via INTRAVENOUS
  Filled 2020-02-15: qty 5

## 2020-02-15 NOTE — Patient Instructions (Signed)
Implanted Port Insertion, Care After °This sheet gives you information about how to care for yourself after your procedure. Your health care provider may also give you more specific instructions. If you have problems or questions, contact your health care provider. °What can I expect after the procedure? °After the procedure, it is common to have: °· Discomfort at the port insertion site. °· Bruising on the skin over the port. This should improve over 3-4 days. °Follow these instructions at home: °Port care °· After your port is placed, you will get a manufacturer's information card. The card has information about your port. Keep this card with you at all times. °· Take care of the port as told by your health care provider. Ask your health care provider if you or a family member can get training for taking care of the port at home. A home health care nurse may also take care of the port. °· Make sure to remember what type of port you have. °Incision care ° °  ° °· Follow instructions from your health care provider about how to take care of your port insertion site. Make sure you: °? Wash your hands with soap and water before and after you change your bandage (dressing). If soap and water are not available, use hand sanitizer. °? Change your dressing as told by your health care provider. °? Leave stitches (sutures), skin glue, or adhesive strips in place. These skin closures may need to stay in place for 2 weeks or longer. If adhesive strip edges start to loosen and curl up, you may trim the loose edges. Do not remove adhesive strips completely unless your health care provider tells you to do that. °· Check your port insertion site every day for signs of infection. Check for: °? Redness, swelling, or pain. °? Fluid or blood. °? Warmth. °? Pus or a bad smell. °Activity °· Return to your normal activities as told by your health care provider. Ask your health care provider what activities are safe for you. °· Do not  lift anything that is heavier than 10 lb (4.5 kg), or the limit that you are told, until your health care provider says that it is safe. °General instructions °· Take over-the-counter and prescription medicines only as told by your health care provider. °· Do not take baths, swim, or use a hot tub until your health care provider approves. Ask your health care provider if you may take showers. You may only be allowed to take sponge baths. °· Do not drive for 24 hours if you were given a sedative during your procedure. °· Wear a medical alert bracelet in case of an emergency. This will tell any health care providers that you have a port. °· Keep all follow-up visits as told by your health care provider. This is important. °Contact a health care provider if: °· You cannot flush your port with saline as directed, or you cannot draw blood from the port. °· You have a fever or chills. °· You have redness, swelling, or pain around your port insertion site. °· You have fluid or blood coming from your port insertion site. °· Your port insertion site feels warm to the touch. °· You have pus or a bad smell coming from the port insertion site. °Get help right away if: °· You have chest pain or shortness of breath. °· You have bleeding from your port that you cannot control. °Summary °· Take care of the port as told by your health   care provider. Keep the manufacturer's information card with you at all times. °· Change your dressing as told by your health care provider. °· Contact a health care provider if you have a fever or chills or if you have redness, swelling, or pain around your port insertion site. °· Keep all follow-up visits as told by your health care provider. °This information is not intended to replace advice given to you by your health care provider. Make sure you discuss any questions you have with your health care provider. °Document Revised: 04/15/2018 Document Reviewed: 04/15/2018 °Elsevier Patient Education ©  2020 [employer] ° °

## 2020-03-19 ENCOUNTER — Other Ambulatory Visit: Payer: Self-pay | Admitting: Family Medicine

## 2020-03-19 DIAGNOSIS — C9110 Chronic lymphocytic leukemia of B-cell type not having achieved remission: Secondary | ICD-10-CM

## 2020-03-28 ENCOUNTER — Ambulatory Visit (INDEPENDENT_AMBULATORY_CARE_PROVIDER_SITE_OTHER): Payer: Medicare Other | Admitting: Family Medicine

## 2020-03-28 ENCOUNTER — Encounter: Payer: Self-pay | Admitting: Family Medicine

## 2020-03-28 VITALS — BP 160/86 | HR 39 | Ht 64.96 in | Wt 219.8 lb

## 2020-03-28 DIAGNOSIS — E119 Type 2 diabetes mellitus without complications: Secondary | ICD-10-CM | POA: Diagnosis not present

## 2020-03-28 DIAGNOSIS — I10 Essential (primary) hypertension: Secondary | ICD-10-CM

## 2020-03-28 DIAGNOSIS — Z794 Long term (current) use of insulin: Secondary | ICD-10-CM

## 2020-03-28 DIAGNOSIS — C9110 Chronic lymphocytic leukemia of B-cell type not having achieved remission: Secondary | ICD-10-CM | POA: Diagnosis not present

## 2020-03-28 DIAGNOSIS — R001 Bradycardia, unspecified: Secondary | ICD-10-CM | POA: Diagnosis not present

## 2020-03-28 LAB — POCT GLYCOSYLATED HEMOGLOBIN (HGB A1C): Hemoglobin A1C: 9.4 % — AB (ref 4.0–5.6)

## 2020-03-28 MED ORDER — LEVEMIR FLEXTOUCH 100 UNIT/ML ~~LOC~~ SOPN: 25.0000 [IU] | PEN_INJECTOR | Freq: Every day | SUBCUTANEOUS | 1 refills | Status: DC

## 2020-03-28 MED ORDER — AMLODIPINE BESYLATE 5 MG PO TABS
5.0000 mg | ORAL_TABLET | Freq: Every day | ORAL | 3 refills | Status: DC
Start: 2020-03-28 — End: 2020-04-05

## 2020-03-28 MED ORDER — METOPROLOL SUCCINATE ER 50 MG PO TB24: 50.0000 mg | ORAL_TABLET | Freq: Every day | ORAL | 3 refills | Status: DC

## 2020-03-28 NOTE — Assessment & Plan Note (Signed)
Followed by Heme/onc.

## 2020-03-28 NOTE — Assessment & Plan Note (Signed)
Diabetes control is improved from last check but not at goal.  Will increase levemir to 25 units.  Recommend monitoring glucose at home.Reviewed recommendations for reduced carbohydrate diet.

## 2020-03-28 NOTE — Assessment & Plan Note (Addendum)
BP elevated however she is bradycardic.  She is on two medications with potential to slow HR including clonidine and metoprolol.  Will reduce metoprolol further and wean from clonidine.   Start amlodipine to replace clonidine and reduction in metoprolol.   Check thyroid function as well.  Recommend low sodium diet.  Follow up in 1 week.

## 2020-03-28 NOTE — Progress Notes (Signed)
Megan Tate - 73 y.o. female MRN [id number]  Date of birth: 1945-10-31  Subjective Chief Complaint  Patient presents with  . Rash  . Foot Swelling    HPI Megan Tate is a 74 y.o. female with history of HTN, T2DM, and CLL here today for follow up visit.  Followed by Dr. Marin Olp for management of CLL.  -HTN:  Current management with losartan, clonidine, aldactone and metoprolol.  Metoprolol reduced previously due to decreased HR and BP.  BP elevated today and HR is lower than previous visit.  She does report that she feels tired at times and has noticed some increase leg swelling.  She denies shortness of breath, palpitations, dizziness or chest pain.    -T2DM:  Current treatment with janumet and levemir 20 units daily.  She reports that she is compliant with medication.  She does not check blood sugars at home unless she is having symptoms.  She does not follow any dietary restrictions.   ROS:  A comprehensive ROS was completed and negative except as noted per HPI    - Allergies  Allergen Reactions  . Tylenol [Acetaminophen] Other (See Comments)    Messes with stomach    Past Medical History:  Diagnosis Date  . Cancer ([place]) CLL  . CLL (chronic lymphocytic leukemia) ([place]) 08/16/2014  . Diabetes mellitus without complication ([place])   . Hypertension     History reviewed. No pertinent surgical history.  Social History   Socioeconomic History  . Marital status: Single    Spouse name: Not on file  . Number of children: Not on file  . Years of education: Not on file  . Highest education level: Not on file  Occupational History  . Not on file  Tobacco Use  . Smoking status: Never Smoker  . Smokeless tobacco: Never Used  . Tobacco comment: never used tobacco  Vaping Use  . Vaping Use: Never used  Substance and Sexual Activity  . Alcohol use: Never    Alcohol/week: 0.0 standard drinks  . Drug use: Never  . Sexual activity: Not on file  Other Topics Concern  . Not on  file  Social History Narrative  . Not on file   Social Determinants of Health   Financial Resource Strain:   . Difficulty of Paying Living Expenses:   Food Insecurity:   . Worried About Charity fundraiser in the Last Year:   . Arboriculturist in the Last Year:   Transportation Needs:   . Film/video editor (Medical):   Marland Kitchen Lack of Transportation (Non-Medical):   Physical Activity:   . Days of Exercise per Week:   . Minutes of Exercise per Session:   Stress:   . Feeling of Stress :   Social Connections:   . Frequency of Communication with Friends and Family:   . Frequency of Social Gatherings with Friends and Family:   . Attends Religious Services:   . Active Member of Clubs or Organizations:   . Attends Archivist Meetings:   Marland Kitchen Marital Status:     History reviewed. No pertinent family history.  Health Maintenance  Topic Date Due  . FOOT EXAM  Never done  . COVID-19 Vaccine (1) Never done  . TETANUS/TDAP  Never done  . COLONOSCOPY  Never done  . DEXA SCAN  Never done  . PNA vac Low Risk Adult (1 of 2 - PCV13) Never done  . OPHTHALMOLOGY EXAM  04/13/2020  . INFLUENZA VACCINE  05/01/2020  . MAMMOGRAM  08/05/2020  . HEMOGLOBIN A1C  09/27/2020  . Hepatitis C Screening  Completed     ----------------------------------------------------------------------------------------------------------------------------------------------------------------------------------------------------------------- Physical Exam BP (!) 160/86 (BP Location: Left Arm, Patient Position: Sitting, Cuff Size: Large)   Pulse (!) 39   Ht 5' 4.96" (1.65 m)   Wt 219 lb 12.8 oz (99.7 kg)   SpO2 97%   BMI 36.62 kg/m   Physical Exam Constitutional:      Appearance: Normal appearance.  HENT:     Head: Normocephalic and atraumatic.  Eyes:     General: No scleral icterus. Cardiovascular:     Rate and Rhythm: Regular rhythm. Bradycardia present.     Comments: Trace LE edema Pulmonary:      Effort: Pulmonary effort is normal.     Breath sounds: Normal breath sounds.  Musculoskeletal:     Cervical back: Neck supple.  Skin:    General: Skin is warm and dry.  Neurological:     General: No focal deficit present.     Mental Status: She is alert.  Psychiatric:        Mood and Affect: Mood normal.        Behavior: Behavior normal.    EKG:  Bradycardia with rate of 43.  No ST or T wave changes.  No heart block noted.  ------------------------------------------------------------------------------------------------------------------------------------------------------------------------------------------------------------------- Assessment and Plan  Benign essential hypertension BP elevated however she is bradycardic.  She is on two medications with potential to slow HR including clonidine and metoprolol.  Will reduce metoprolol further and wean from clonidine.   Start amlodipine to replace clonidine and reduction in metoprolol.   Check thyroid function as well.  Recommend low sodium diet.  Follow up in 1 week.     Type 2 diabetes mellitus without complication, with long-term current use of insulin (HCC) Diabetes control is improved from last check but not at goal.  Will increase levemir to 25 units.  Recommend monitoring glucose at home.Reviewed recommendations for reduced carbohydrate diet.   CLL (chronic lymphocytic leukemia) Followed by Heme/onc.   Meds ordered this encounter  Medications  . insulin detemir (LEVEMIR FLEXTOUCH) 100 UNIT/ML FlexPen    Sig: Inject 25 Units into the skin daily.    Dispense:  23 mL    Refill:  1  . metoprolol succinate (TOPROL-XL) 50 MG 24 hr tablet    Sig: Take 1 tablet (50 mg total) by mouth daily. Take with or immediately following a meal.    Dispense:  90 tablet    Refill:  3  . amLODipine (NORVASC) 5 MG tablet    Sig: Take 1 tablet (5 mg total) by mouth daily.    Dispense:  90 tablet    Refill:  3    No follow-ups on  file.    This visit occurred during the SARS-CoV-2 public health emergency.  Safety protocols were in place, including screening questions prior to the visit, additional usage of staff PPE, and extensive cleaning of exam room while observing appropriate contact time as indicated for disinfecting solutions.

## 2020-03-28 NOTE — Patient Instructions (Addendum)
-  Let's reduce your metoprolol to 50mg  daily, I have sent in a new prescription for this.  I would also like to wean you from clonidine as these may be causing your heart rate to slow.  To wean from clonidine reduce to 1/2 tab for the next 3 days, then 1/2 tab every other day for 4 days.    -Start amlodipine 5mg  for blood pressure.   -Increase levemir to 25 units daily  -See me again in 1 week.

## 2020-03-29 LAB — TSH: TSH: 0.95 mIU/L (ref 0.40–4.50)

## 2020-03-29 LAB — T4, FREE: Free T4: 1.2 ng/dL (ref 0.8–1.8)

## 2020-04-01 ENCOUNTER — Inpatient Hospital Stay (HOSPITAL_BASED_OUTPATIENT_CLINIC_OR_DEPARTMENT_OTHER): Payer: Medicare Other | Admitting: Hematology & Oncology

## 2020-04-01 ENCOUNTER — Other Ambulatory Visit: Payer: Self-pay

## 2020-04-01 ENCOUNTER — Inpatient Hospital Stay: Payer: Medicare Other

## 2020-04-01 ENCOUNTER — Inpatient Hospital Stay: Payer: Medicare Other | Attending: Hematology & Oncology

## 2020-04-01 ENCOUNTER — Encounter: Payer: Self-pay | Admitting: Hematology & Oncology

## 2020-04-01 VITALS — BP 185/89 | HR 80 | Temp 99.0°F | Resp 18 | Wt 216.0 lb

## 2020-04-01 DIAGNOSIS — Z79899 Other long term (current) drug therapy: Secondary | ICD-10-CM | POA: Insufficient documentation

## 2020-04-01 DIAGNOSIS — R21 Rash and other nonspecific skin eruption: Secondary | ICD-10-CM | POA: Insufficient documentation

## 2020-04-01 DIAGNOSIS — M549 Dorsalgia, unspecified: Secondary | ICD-10-CM | POA: Insufficient documentation

## 2020-04-01 DIAGNOSIS — C9110 Chronic lymphocytic leukemia of B-cell type not having achieved remission: Secondary | ICD-10-CM

## 2020-04-01 DIAGNOSIS — Z9221 Personal history of antineoplastic chemotherapy: Secondary | ICD-10-CM | POA: Diagnosis not present

## 2020-04-01 LAB — CMP (CANCER CENTER ONLY)
ALT: 10 U/L (ref 0–44)
AST: 11 U/L — ABNORMAL LOW (ref 15–41)
Albumin: 4.2 g/dL (ref 3.5–5.0)
Alkaline Phosphatase: 105 U/L (ref 38–126)
Anion gap: 7 (ref 5–15)
BUN: 13 mg/dL (ref 8–23)
CO2: 31 mmol/L (ref 22–32)
Calcium: 11 mg/dL — ABNORMAL HIGH (ref 8.9–10.3)
Chloride: 100 mmol/L (ref 98–111)
Creatinine: 1.32 mg/dL — ABNORMAL HIGH (ref 0.44–1.00)
GFR, Est AFR Am: 46 mL/min — ABNORMAL LOW (ref >60)
GFR, Estimated: 40 mL/min — ABNORMAL LOW (ref >60)
Glucose, Bld: 302 mg/dL — ABNORMAL HIGH (ref 70–99)
Potassium: 3.3 mmol/L — ABNORMAL LOW (ref 3.5–5.1)
Sodium: 138 mmol/L (ref 135–145)
Total Bilirubin: 0.8 mg/dL (ref 0.3–1.2)
Total Protein: 6.3 g/dL — ABNORMAL LOW (ref 6.5–8.1)

## 2020-04-01 LAB — CBC WITH DIFFERENTIAL (CANCER CENTER ONLY)
Abs Immature Granulocytes: 0.02 10*3/uL (ref 0.00–0.07)
Basophils Absolute: 0 10*3/uL (ref 0.0–0.1)
Basophils Relative: 0 %
Eosinophils Absolute: 0.1 10*3/uL (ref 0.0–0.5)
Eosinophils Relative: 1 %
HCT: 41.3 % (ref 36.0–46.0)
Hemoglobin: 12.9 g/dL (ref 12.0–15.0)
Immature Granulocytes: 0 %
Lymphocytes Relative: 49 %
Lymphs Abs: 3.3 10*3/uL (ref 0.7–4.0)
MCH: 23.4 pg — ABNORMAL LOW (ref 26.0–34.0)
MCHC: 31.2 g/dL (ref 30.0–36.0)
MCV: 75 fL — ABNORMAL LOW (ref 80.0–100.0)
Monocytes Absolute: 0.8 10*3/uL (ref 0.1–1.0)
Monocytes Relative: 12 %
Neutro Abs: 2.6 10*3/uL (ref 1.7–7.7)
Neutrophils Relative %: 38 %
Platelet Count: 198 10*3/uL (ref 150–400)
RBC: 5.51 MIL/uL — ABNORMAL HIGH (ref 3.87–5.11)
RDW: 14 % (ref 11.5–15.5)
WBC Count: 6.9 10*3/uL (ref 4.0–10.5)
nRBC: 0 % (ref 0.0–0.2)

## 2020-04-01 LAB — LACTATE DEHYDROGENASE: LDH: 143 U/L (ref 98–192)

## 2020-04-01 MED ORDER — DOXYCYCLINE HYCLATE 100 MG PO TABS
100.0000 mg | ORAL_TABLET | Freq: Two times a day (BID) | ORAL | 0 refills | Status: DC
Start: 2020-04-01 — End: 2020-06-14

## 2020-04-01 NOTE — Progress Notes (Signed)
Hematology and Oncology Follow Up Visit  Megan Tate [id number] [birth year] 74 y.o. 04/01/2020   Principle Diagnosis:  Chronic lymphocytic leukemia- Trisomy 12  Past Therapy:             Status post 6 cycles of Gazyva/Bendamustine - completed4/2016 Maintenance Gazyva every 3 months s/p cycle 11 -completed 2 years in June 2018  Current Therapy:   Observation   Interim History:  Megan Tate is here today for follow-up.  She has this rash on her legs.  These are little punctate type lesions.  It almost  looks like folliculitis.  I will go ahead and try her on some doxycycline.  We will try doxycycline 100 mg p.o. twice daily x10 days.  As always, her blood sugars are quite high.  Blood sugar today is 307.  This is certainly no surprise.  I am just not sure how aggressive she has been with her diabetes.  She is complaining of more back pain.  I do worry about the CLL recurrent with lymphadenopathy.  Her last scans were done 5 months ago.  I think it would be reasonable to do another set of scans.  I also need to mention that her lymphocyte percentage is gradually going up.  She and her husband will have a fairly quiet July 4 weekend.  She has had no fever.  She has had no nausea or vomiting.  There is been no change in bowel or bladder habits.  We will flush her Port-A-Cath today.  Overall, her performance status is ECOG 1.     Medications:  Allergies as of 04/01/2020      Reactions   Tylenol [acetaminophen] Other (See Comments)   Messes with stomach      Medication List       Accurate as of April 01, 2020 10:33 AM. If you have any questions, ask your nurse or doctor.        amLODipine 5 MG tablet Commonly known as: NORVASC Take 1 tablet (5 mg total) by mouth daily.   BD Pen Needle Nano U/F 32G X 4 MM Misc Generic drug: Insulin Pen Needle PATIENT IS TO CHECK BLOOD SUGAR DAILY BEFORE MEALS AND BEDTIME. DX. E11.9   dorzolamide-timolol 22.3-6.8 MG/ML ophthalmic  solution Commonly known as: COSOPT Place 1 drop into both eyes 2 (two) times daily.   Ferrous Sulfate 50 MG Tbcr Commonly known as: Slow Release Iron Take 1 tablet by mouth daily.   fluconazole 100 MG tablet Commonly known as: DIFLUCAN Take 1 tablet (100 mg total) by mouth daily.   fluticasone 50 MCG/ACT nasal spray Commonly known as: FLONASE Place 2 sprays into both nostrils daily.   folic acid 1 MG tablet Commonly known as: FOLVITE TAKE 2 TABLETS BY MOUTH EVERY DAY   Fusion Plus Caps Take 130 mg by mouth every morning.   glucose blood test strip Commonly known as: ONE TOUCH ULTRA TEST USE TO TEST BLOOD SUGAR BEFORE MEALS AND AT BEDTIME   OneTouch Ultra test strip Generic drug: glucose blood Use to check blood glucose qid.  Diagnosis: E11.9   Janumet XR 50-1000 MG Tb24 Generic drug: SitaGLIPtin-MetFORMIN HCl TAKE 2 TABLETS BY MOUTH EVERY DAY   Levemir FlexTouch 100 UNIT/ML FlexPen Generic drug: insulin detemir Inject 25 Units into the skin daily.   lidocaine-prilocaine cream Commonly known as: EMLA Apply to Port-A-Cath site 1 hour prior to treatment   Linzess 145 MCG Caps capsule Generic drug: linaclotide Take 1 capsule by mouth daily.  losartan 100 MG tablet Commonly known as: COZAAR TAKE 1 TABLET BY MOUTH ONCE DAILY   meclizine 25 MG tablet Commonly known as: ANTIVERT Take 1 tablet (25 mg total) by mouth 3 (three) times daily as needed for dizziness.   metoprolol succinate 50 MG 24 hr tablet Commonly known as: TOPROL-XL Take 1 tablet (50 mg total) by mouth daily. Take with or immediately following a meal.   ondansetron 4 MG disintegrating tablet Commonly known as: Zofran ODT Take 1 tablet (4 mg total) by mouth every 8 (eight) hours as needed for nausea or vomiting.   ONE TOUCH ULTRA SYSTEM KIT w/Device Kit 1 kit by Does not apply route once. Check blood sugar daily before meals and at bedtime.   ONE TOUCH ULTRA 2 w/Device Kit Use to check blood  glucose qid.  Diagnosis: E11.9   OneTouch Delica Plus Lancet33G Misc 1 Device by Does not apply route 4 (four) times daily. Use to check blood glucose qid.  Diagnosis: E11.9   rosuvastatin 20 MG tablet Commonly known as: CRESTOR TAKE 1 TABLET BY MOUTH EVERY DAY   spironolactone 50 MG tablet Commonly known as: ALDACTONE TAKE 1 TABLET BY MOUTH EVERY DAY   tiZANidine 2 MG tablet Commonly known as: ZANAFLEX Take 1 tablet (2 mg total) by mouth every 8 (eight) hours as needed for muscle spasms.   UNABLE TO FIND One Touch Ultra Blood Glucose Monitoring Meter. Dx E11.9       Allergies:  Allergies  Allergen Reactions  . Tylenol [Acetaminophen] Other (See Comments)    Messes with stomach    Past Medical History, Surgical history, Social history, and Family History were reviewed and updated.  Review of Systems: Review of Systems  Constitutional: Negative.   HENT: Negative.   Eyes: Negative.   Respiratory: Negative.   Cardiovascular: Negative.   Gastrointestinal: Negative.   Genitourinary: Negative.   Musculoskeletal: Negative.   Skin: Negative.   Neurological: Negative.   Endo/Heme/Allergies: Negative.   Psychiatric/Behavioral: Negative.      Physical Exam:  weight is 216 lb (98 kg). Her oral temperature is 99 F (37.2 C). Her blood pressure is 185/89 (abnormal) and her pulse is 80. Her respiration is 18 and oxygen saturation is 100%.   Wt Readings from Last 3 Encounters:  04/01/20 216 lb (98 kg)  03/28/20 219 lb 12.8 oz (99.7 kg)  11/23/19 218 lb (98.9 kg)    Physical Exam Vitals reviewed.  HENT:     Head: Normocephalic and atraumatic.  Eyes:     Pupils: Pupils are equal, round, and reactive to light.  Cardiovascular:     Rate and Rhythm: Normal rate and regular rhythm.     Heart sounds: Normal heart sounds.  Pulmonary:     Effort: Pulmonary effort is normal.     Breath sounds: Normal breath sounds.  Abdominal:     General: Bowel sounds are normal.      Palpations: Abdomen is soft.  Musculoskeletal:        General: No tenderness or deformity. Normal range of motion.     Cervical back: Normal range of motion.  Lymphadenopathy:     Cervical: No cervical adenopathy.  Skin:    General: Skin is warm and dry.     Findings: No erythema or rash.  Neurological:     Mental Status: She is alert and oriented to person, place, and time.  Psychiatric:        Behavior: Behavior normal.          Thought Content: Thought content normal.        Judgment: Judgment normal.      Lab Results  Component Value Date   WBC 6.9 04/01/2020   HGB 12.9 04/01/2020   HCT 41.3 04/01/2020   MCV 75.0 (L) 04/01/2020   PLT 198 04/01/2020   Lab Results  Component Value Date   FERRITIN 442 (H) 07/31/2018   IRON 88 07/31/2018   TIBC 253 07/31/2018   UIBC 165 07/31/2018   IRONPCTSAT 35 07/31/2018   Lab Results  Component Value Date   RETICCTPCT 1.1 05/08/2018   RBC 5.51 (H) 04/01/2020   RETICCTABS 65.4 04/07/2014   Lab Results  Component Value Date   KPAFRELGTCHN 12.7 01/04/2020   LAMBDASER 5.9 01/04/2020   KAPLAMBRATIO 2.15 (H) 01/04/2020   Lab Results  Component Value Date   IGGSERUM 465 (L) 01/04/2020   IGA 23 (L) 01/04/2020   IGMSERUM 6 (L) 01/04/2020   Lab Results  Component Value Date   TOTALPROTELP 5.5 (L) 01/04/2020   ALBUMINELP 3.2 01/04/2020   A1GS 0.2 01/04/2020   A2GS 0.8 01/04/2020   BETS 0.8 01/04/2020   BETA2SER 0.2 03/30/2015   GAMS 0.4 01/04/2020   MSPIKE Not Observed 01/04/2020   SPEI * 03/30/2015     Chemistry      Component Value Date/Time   NA 136 01/04/2020 0837   NA 142 09/05/2017 0751   K 3.6 01/04/2020 0837   K 3.8 09/05/2017 0751   CL 101 01/04/2020 0837   CL 104 09/05/2017 0751   CO2 28 01/04/2020 0837   CO2 27 09/05/2017 0751   BUN 11 01/04/2020 0837   BUN 11 09/05/2017 0751   CREATININE 1.26 (H) 01/04/2020 0837   CREATININE 1.1 09/05/2017 0751      Component Value Date/Time   CALCIUM 10.5 (H)  01/04/2020 0837   CALCIUM 10.8 (H) 09/05/2017 0751   ALKPHOS 125 01/04/2020 0837   ALKPHOS 88 (H) 09/05/2017 0751   AST 10 (L) 01/04/2020 0837   ALT 8 01/04/2020 0837   ALT 14 09/05/2017 0751   BILITOT 0.6 01/04/2020 0837      Impression and Plan: Ms. Barrick is a pleasant 74 yo African American female with chronic lymphocytic leukemia. She completed treatment with Gazyva/Bendamustine in April 2016 and maintenance Gazyva in June 2018.   We will see what the CAT scan shows.  We will get these in 2 weeks.  Hopefully, the doxycycline will help with this rash on her legs.  Again this rash looks like folliculitis.  I would like to see her back in 6 weeks.  I do think we have to follow her a little more closely since there are some changes with her status.  I spent about 35 minutes with her today.  I had to figure out what we can do for the rash and we had a talk about the CAT scan and her blood sugars.    Volanda Napoleon, MD 7/2/202110:33 AM

## 2020-04-02 LAB — HEMOGLOBIN A1C
Hgb A1c MFr Bld: 9.9 % — ABNORMAL HIGH (ref 4.8–5.6)
Mean Plasma Glucose: 237 mg/dL

## 2020-04-05 ENCOUNTER — Encounter: Payer: Self-pay | Admitting: Family Medicine

## 2020-04-05 ENCOUNTER — Ambulatory Visit (INDEPENDENT_AMBULATORY_CARE_PROVIDER_SITE_OTHER): Payer: Medicare Other | Admitting: Family Medicine

## 2020-04-05 ENCOUNTER — Other Ambulatory Visit: Payer: Self-pay

## 2020-04-05 DIAGNOSIS — I10 Essential (primary) hypertension: Secondary | ICD-10-CM

## 2020-04-05 DIAGNOSIS — L739 Follicular disorder, unspecified: Secondary | ICD-10-CM | POA: Insufficient documentation

## 2020-04-05 MED ORDER — AMLODIPINE BESYLATE 10 MG PO TABS: 10.0000 mg | ORAL_TABLET | Freq: Every day | ORAL | 2 refills | Status: DC

## 2020-04-05 NOTE — Progress Notes (Signed)
Megan Tate - 74 y.o. female MRN [id number]  Date of birth: 01-05-1946  Subjective Chief Complaint  Patient presents with  . Bradycardia    HPI Megan Tate is a 74 y.o. female here today for follow up of bradycardia.  Last week HR was noted to be 43bpm.  No heart block noted.  She denied symptoms at that time.I had her taper from clonidine and reduce her metoprolol to 50mg .  HR improved today but BP is up.   She is taking new medication of amlodipine.  She denies any symptoms today including chest pain, shortness of breath, palpitations, headache or vision changes.    Also started on doxycyline by Dr. Marin Olp for rash that appeared to be folliculitis.  She feels that this is improving.   ROS:  A comprehensive ROS was completed and negative except as noted per HPI  Allergies  Allergen Reactions  . Tylenol [Acetaminophen] Other (See Comments)    Messes with stomach    Past Medical History:  Diagnosis Date  . Cancer ([place]) CLL  . CLL (chronic lymphocytic leukemia) ([place]) 08/16/2014  . Diabetes mellitus without complication ([place])   . Hypertension     History reviewed. No pertinent surgical history.  Social History   Socioeconomic History  . Marital status: Single    Spouse name: Not on file  . Number of children: Not on file  . Years of education: Not on file  . Highest education level: Not on file  Occupational History  . Not on file  Tobacco Use  . Smoking status: Never Smoker  . Smokeless tobacco: Never Used  . Tobacco comment: never used tobacco  Vaping Use  . Vaping Use: Never used  Substance and Sexual Activity  . Alcohol use: Never    Alcohol/week: 0.0 standard drinks  . Drug use: Never  . Sexual activity: Not on file  Other Topics Concern  . Not on file  Social History Narrative  . Not on file   Social Determinants of Health   Financial Resource Strain:   . Difficulty of Paying Living Expenses:   Food Insecurity:   . Worried About Charity fundraiser  in the Last Year:   . Arboriculturist in the Last Year:   Transportation Needs:   . Film/video editor (Medical):   Marland Kitchen Lack of Transportation (Non-Medical):   Physical Activity:   . Days of Exercise per Week:   . Minutes of Exercise per Session:   Stress:   . Feeling of Stress :   Social Connections:   . Frequency of Communication with Friends and Family:   . Frequency of Social Gatherings with Friends and Family:   . Attends Religious Services:   . Active Member of Clubs or Organizations:   . Attends Archivist Meetings:   Marland Kitchen Marital Status:     History reviewed. No pertinent family history.  Health Maintenance  Topic Date Due  . FOOT EXAM  Never done  . COVID-19 Vaccine (1) Never done  . TETANUS/TDAP  Never done  . COLONOSCOPY  Never done  . DEXA SCAN  Never done  . PNA vac Low Risk Adult (1 of 2 - PCV13) Never done  . OPHTHALMOLOGY EXAM  04/13/2020  . INFLUENZA VACCINE  05/01/2020  . MAMMOGRAM  08/05/2020  . HEMOGLOBIN A1C  10/02/2020  . Hepatitis C Screening  Completed     ----------------------------------------------------------------------------------------------------------------------------------------------------------------------------------------------------------------- Physical Exam BP (!) 169/95 (BP Location: Left Arm, Patient Position: Sitting, Cuff  Size: Large)   Pulse 85   Ht 5' 4.96" (1.65 m)   Wt 209 lb 11.2 oz (95.1 kg)   BMI 34.94 kg/m   Physical Exam Constitutional:      Appearance: Normal appearance.  HENT:     Head: Normocephalic and atraumatic.  Cardiovascular:     Rate and Rhythm: Normal rate and regular rhythm.  Pulmonary:     Effort: Pulmonary effort is normal.     Breath sounds: Normal breath sounds.  Musculoskeletal:     Cervical back: Neck supple.  Skin:    General: Skin is warm and dry.  Neurological:     General: No focal deficit present.     Mental Status: She is alert.  Psychiatric:        Mood and Affect:  Mood normal.        Behavior: Behavior normal.     ------------------------------------------------------------------------------------------------------------------------------------------------------------------------------------------------------------------- Assessment and Plan  Benign essential hypertension BP elevated today.  Will increased amlodipine further to 10mg .  Continue losartan at current strength.   F/u in 2 weeks for nurse visit for BP recheck.   Folliculitis She notes some improvement with doxycycline.    Meds ordered this encounter  Medications  . amLODipine (NORVASC) 10 MG tablet    Sig: Take 1 tablet (10 mg total) by mouth daily.    Dispense:  90 tablet    Refill:  2    Return in about 2 weeks (around 04/19/2020) for Nurse visit for BP check.    This visit occurred during the SARS-CoV-2 public health emergency.  Safety protocols were in place, including screening questions prior to the visit, additional usage of staff PPE, and extensive cleaning of exam room while observing appropriate contact time as indicated for disinfecting solutions.

## 2020-04-05 NOTE — Patient Instructions (Signed)
Heart rate has improved.  Increase amlodipine to 10mg  daily.  Return in about 2 weeks for BP check.

## 2020-04-05 NOTE — Assessment & Plan Note (Signed)
BP elevated today.  Will increased amlodipine further to 10mg .  Continue losartan at current strength.   F/u in 2 weeks for nurse visit for BP recheck.

## 2020-04-05 NOTE — Assessment & Plan Note (Signed)
She notes some improvement with doxycycline.

## 2020-04-06 ENCOUNTER — Other Ambulatory Visit: Payer: Self-pay | Admitting: Hematology & Oncology

## 2020-04-06 ENCOUNTER — Other Ambulatory Visit: Payer: Self-pay | Admitting: Family Medicine

## 2020-04-06 DIAGNOSIS — C9110 Chronic lymphocytic leukemia of B-cell type not having achieved remission: Secondary | ICD-10-CM

## 2020-04-06 DIAGNOSIS — M25551 Pain in right hip: Secondary | ICD-10-CM

## 2020-04-06 DIAGNOSIS — Z1231 Encounter for screening mammogram for malignant neoplasm of breast: Secondary | ICD-10-CM

## 2020-04-15 ENCOUNTER — Ambulatory Visit (HOSPITAL_BASED_OUTPATIENT_CLINIC_OR_DEPARTMENT_OTHER): Payer: Medicare Other

## 2020-04-18 ENCOUNTER — Ambulatory Visit (INDEPENDENT_AMBULATORY_CARE_PROVIDER_SITE_OTHER): Payer: Medicare Other | Admitting: Family Medicine

## 2020-04-18 ENCOUNTER — Other Ambulatory Visit: Payer: Self-pay

## 2020-04-18 VITALS — BP 143/58 | HR 48

## 2020-04-18 DIAGNOSIS — I10 Essential (primary) hypertension: Secondary | ICD-10-CM

## 2020-04-18 NOTE — Progress Notes (Signed)
Established Patient Office Visit  Subjective:  Patient ID: Megan Tate, female    DOB: [birth year]  Age: 74 y.o. MRN: [id number]  CC:  Chief Complaint  Patient presents with  . Hypertension    HPI Logen Fowle presents for blood pressure check. Denies chest pain, shortness of breath or dizziness.   Past Medical History:  Diagnosis Date  . Cancer ([place]) CLL  . CLL (chronic lymphocytic leukemia) ([place]) 08/16/2014  . Diabetes mellitus without complication ([place])   . Hypertension     History reviewed. No pertinent surgical history.  History reviewed. No pertinent family history.  Social History   Socioeconomic History  . Marital status: Single    Spouse name: Not on file  . Number of children: Not on file  . Years of education: Not on file  . Highest education level: Not on file  Occupational History  . Not on file  Tobacco Use  . Smoking status: Never Smoker  . Smokeless tobacco: Never Used  . Tobacco comment: never used tobacco  Vaping Use  . Vaping Use: Never used  Substance and Sexual Activity  . Alcohol use: Never    Alcohol/week: 0.0 standard drinks  . Drug use: Never  . Sexual activity: Not on file  Other Topics Concern  . Not on file  Social History Narrative  . Not on file   Social Determinants of Health   Financial Resource Strain:   . Difficulty of Paying Living Expenses:   Food Insecurity:   . Worried About Charity fundraiser in the Last Year:   . Arboriculturist in the Last Year:   Transportation Needs:   . Film/video editor (Medical):   Marland Kitchen Lack of Transportation (Non-Medical):   Physical Activity:   . Days of Exercise per Week:   . Minutes of Exercise per Session:   Stress:   . Feeling of Stress :   Social Connections:   . Frequency of Communication with Friends and Family:   . Frequency of Social Gatherings with Friends and Family:   . Attends Religious Services:   . Active Member of Clubs or Organizations:   . Attends Theatre manager Meetings:   Marland Kitchen Marital Status:   Intimate Partner Violence:   . Fear of Current or Ex-Partner:   . Emotionally Abused:   Marland Kitchen Physically Abused:   . Sexually Abused:     Outpatient Medications Prior to Visit  Medication Sig Dispense Refill  . amLODipine (NORVASC) 10 MG tablet Take 1 tablet (10 mg total) by mouth daily. 90 tablet 2  . Blood Glucose Monitoring Suppl (ONE TOUCH ULTRA 2) w/Device KIT Use to check blood glucose qid.  Diagnosis: E11.9 1 kit 0  . Blood Glucose Monitoring Suppl (ONE TOUCH ULTRA SYSTEM KIT) W/DEVICE KIT 1 kit by Does not apply route once. Check blood sugar daily before meals and at bedtime. 1 each 0  . dorzolamide-timolol (COSOPT) 22.3-6.8 MG/ML ophthalmic solution Place 1 drop into both eyes 2 (two) times daily.     Marland Kitchen doxycycline (VIBRA-TABS) 100 MG tablet Take 1 tablet (100 mg total) by mouth 2 (two) times daily. 20 tablet 0  . Ferrous Sulfate (SLOW RELEASE IRON) 50 MG TBCR Take 1 tablet by mouth daily. 30 tablet 11  . fluconazole (DIFLUCAN) 100 MG tablet Take 1 tablet (100 mg total) by mouth daily. 10 tablet 1  . fluticasone (FLONASE) 50 MCG/ACT nasal spray Place 2 sprays into both nostrils daily. 16 g 0  .  folic acid (FOLVITE) 1 MG tablet TAKE 2 TABLETS BY MOUTH EVERY DAY 180 tablet 4  . glucose blood (ONE TOUCH ULTRA TEST) test strip USE TO TEST BLOOD SUGAR BEFORE MEALS AND AT BEDTIME 360 each 0  . glucose blood (ONETOUCH ULTRA) test strip Use to check blood glucose qid.  Diagnosis: E11.9 100 each 12  . insulin detemir (LEVEMIR FLEXTOUCH) 100 UNIT/ML FlexPen Inject 25 Units into the skin daily. 23 mL 1  . Insulin Pen Needle (BD PEN NEEDLE NANO U/F) 32G X 4 MM MISC PATIENT IS TO CHECK BLOOD SUGAR DAILY BEFORE MEALS AND BEDTIME. DX. E11.9 200 each 3  . Iron-FA-B Cmp-C-Biot-Probiotic (FUSION PLUS) CAPS Take 130 mg by mouth every morning.    Marland Kitchen JANUMET XR 50-1000 MG TB24 TAKE 2 TABLETS BY MOUTH EVERY DAY 180 tablet 0  . Lancets (ONETOUCH DELICA PLUS  [id number]) MISC 1 Device by Does not apply route 4 (four) times daily. Use to check blood glucose qid.  Diagnosis: E11.9 120 each 3  . lidocaine-prilocaine (EMLA) cream Apply to Port-A-Cath site 1 hour prior to treatment 30 g 4  . LINZESS 145 MCG CAPS capsule Take 1 capsule by mouth daily.    Marland Kitchen losartan (COZAAR) 100 MG tablet TAKE 1 TABLET BY MOUTH EVERY DAY 30 tablet 1  . meclizine (ANTIVERT) 25 MG tablet Take 1 tablet (25 mg total) by mouth 3 (three) times daily as needed for dizziness. 30 tablet 0  . metoprolol succinate (TOPROL-XL) 50 MG 24 hr tablet Take 1 tablet (50 mg total) by mouth daily. Take with or immediately following a meal. 90 tablet 3  . ondansetron (ZOFRAN ODT) 4 MG disintegrating tablet Take 1 tablet (4 mg total) by mouth every 8 (eight) hours as needed for nausea or vomiting. 20 tablet 0  . rosuvastatin (CRESTOR) 20 MG tablet TAKE 1 TABLET BY MOUTH EVERY DAY 90 tablet 3  . spironolactone (ALDACTONE) 50 MG tablet TAKE 1 TABLET BY MOUTH EVERY DAY 90 tablet 2  . tiZANidine (ZANAFLEX) 2 MG tablet Take 1 tablet (2 mg total) by mouth every 8 (eight) hours as needed for muscle spasms. 270 tablet 1  . UNABLE TO FIND One Touch Ultra Blood Glucose Monitoring Meter. Dx E11.9 1 kit 1  . metoprolol (TOPROL-XL) 200 MG 24 hr tablet TAKE 1 TABLET BY MOUTH EVERY DAY (Patient not taking: Reported on 04/18/2020) 90 tablet 2   No facility-administered medications prior to visit.    Allergies  Allergen Reactions  . Tylenol [Acetaminophen] Other (See Comments)    Messes with stomach    ROS Review of Systems    Objective:    Physical Exam  BP (!) 165/95   Pulse 67   SpO2 100%  Wt Readings from Last 3 Encounters:  04/05/20 209 lb 11.2 oz (95.1 kg)  04/01/20 216 lb (98 kg)  03/28/20 219 lb 12.8 oz (99.7 kg)     Health Maintenance Due  Topic Date Due  . FOOT EXAM  Never done  . COLONOSCOPY  Never done  . DEXA SCAN  Never done  . OPHTHALMOLOGY EXAM  04/13/2020    There are no  preventive care reminders to display for this patient.  Lab Results  Component Value Date   TSH 0.95 03/28/2020   Lab Results  Component Value Date   WBC 6.9 04/01/2020   HGB 12.9 04/01/2020   HCT 41.3 04/01/2020   MCV 75.0 (L) 04/01/2020   PLT 198 04/01/2020   Lab Results  Component  Value Date   NA 138 04/01/2020   K 3.3 (L) 04/01/2020   CO2 31 04/01/2020   GLUCOSE 302 (H) 04/01/2020   BUN 13 04/01/2020   CREATININE 1.32 (H) 04/01/2020   BILITOT 0.8 04/01/2020   ALKPHOS 105 04/01/2020   AST 11 (L) 04/01/2020   ALT 10 04/01/2020   PROT 6.3 (L) 04/01/2020   ALBUMIN 4.2 04/01/2020   CALCIUM 11.0 (H) 04/01/2020   ANIONGAP 7 04/01/2020   GFR 50.52 (L) 07/28/2018   Lab Results  Component Value Date   CHOL 101 05/18/2019   Lab Results  Component Value Date   HDL 36.00 (L) 05/18/2019   Lab Results  Component Value Date   LDLCALC 49 05/18/2019   Lab Results  Component Value Date   TRIG 80.0 05/18/2019   Lab Results  Component Value Date   CHOLHDL 3 05/18/2019   Lab Results  Component Value Date   HGBA1C 9.9 (H) 04/01/2020      Assessment & Plan:  HTN- Patient advised to continue with current medications. Follow up in 2 months for DM and HTN.    Problem List Items Addressed This Visit    Benign essential hypertension - Primary      No orders of the defined types were placed in this encounter.   Follow-up: Return in about 2 months (around 06/19/2020) for DM and HTN in 2 months.    Lavell Luster, [place]

## 2020-04-18 NOTE — Progress Notes (Signed)
Medical screening examination/treatment was performed by qualified clinical staff member and as supervising physician I was immediately available for consultation/collaboration. I have reviewed documentation and agree with assessment and plan.  Rajah Lamba, DO  

## 2020-04-19 ENCOUNTER — Ambulatory Visit: Payer: Medicare Other | Admitting: Hematology & Oncology

## 2020-04-19 ENCOUNTER — Other Ambulatory Visit: Payer: Medicare Other

## 2020-05-02 ENCOUNTER — Other Ambulatory Visit: Payer: Self-pay | Admitting: Family Medicine

## 2020-05-02 DIAGNOSIS — C9110 Chronic lymphocytic leukemia of B-cell type not having achieved remission: Secondary | ICD-10-CM

## 2020-05-09 DIAGNOSIS — H40053 Ocular hypertension, bilateral: Secondary | ICD-10-CM | POA: Diagnosis not present

## 2020-05-09 DIAGNOSIS — E113293 Type 2 diabetes mellitus with mild nonproliferative diabetic retinopathy without macular edema, bilateral: Secondary | ICD-10-CM | POA: Diagnosis not present

## 2020-05-09 DIAGNOSIS — H2513 Age-related nuclear cataract, bilateral: Secondary | ICD-10-CM | POA: Diagnosis not present

## 2020-05-09 DIAGNOSIS — H43393 Other vitreous opacities, bilateral: Secondary | ICD-10-CM | POA: Diagnosis not present

## 2020-05-09 DIAGNOSIS — H5201 Hypermetropia, right eye: Secondary | ICD-10-CM | POA: Diagnosis not present

## 2020-05-09 DIAGNOSIS — H524 Presbyopia: Secondary | ICD-10-CM | POA: Diagnosis not present

## 2020-05-09 DIAGNOSIS — H04123 Dry eye syndrome of bilateral lacrimal glands: Secondary | ICD-10-CM | POA: Diagnosis not present

## 2020-05-09 DIAGNOSIS — H52203 Unspecified astigmatism, bilateral: Secondary | ICD-10-CM | POA: Diagnosis not present

## 2020-05-09 DIAGNOSIS — Z794 Long term (current) use of insulin: Secondary | ICD-10-CM | POA: Diagnosis not present

## 2020-05-09 DIAGNOSIS — Z83511 Family history of glaucoma: Secondary | ICD-10-CM | POA: Diagnosis not present

## 2020-05-11 ENCOUNTER — Inpatient Hospital Stay: Payer: Medicare Other

## 2020-05-11 ENCOUNTER — Encounter: Payer: Self-pay | Admitting: Hematology & Oncology

## 2020-05-11 ENCOUNTER — Other Ambulatory Visit: Payer: Self-pay

## 2020-05-11 ENCOUNTER — Inpatient Hospital Stay: Payer: Medicare Other | Attending: Hematology & Oncology | Admitting: Hematology & Oncology

## 2020-05-11 VITALS — BP 161/85 | HR 68 | Temp 98.6°F | Resp 17 | Ht 64.0 in | Wt 219.0 lb

## 2020-05-11 DIAGNOSIS — R21 Rash and other nonspecific skin eruption: Secondary | ICD-10-CM | POA: Insufficient documentation

## 2020-05-11 DIAGNOSIS — C9110 Chronic lymphocytic leukemia of B-cell type not having achieved remission: Secondary | ICD-10-CM | POA: Diagnosis not present

## 2020-05-11 DIAGNOSIS — Z79899 Other long term (current) drug therapy: Secondary | ICD-10-CM | POA: Diagnosis not present

## 2020-05-11 DIAGNOSIS — R001 Bradycardia, unspecified: Secondary | ICD-10-CM | POA: Insufficient documentation

## 2020-05-11 DIAGNOSIS — Z794 Long term (current) use of insulin: Secondary | ICD-10-CM

## 2020-05-11 DIAGNOSIS — Z95828 Presence of other vascular implants and grafts: Secondary | ICD-10-CM

## 2020-05-11 DIAGNOSIS — E119 Type 2 diabetes mellitus without complications: Secondary | ICD-10-CM | POA: Diagnosis not present

## 2020-05-11 LAB — CMP (CANCER CENTER ONLY)
ALT: 10 U/L (ref 0–44)
AST: 10 U/L — ABNORMAL LOW (ref 15–41)
Albumin: 3.9 g/dL (ref 3.5–5.0)
Alkaline Phosphatase: 90 U/L (ref 38–126)
Anion gap: 5 (ref 5–15)
BUN: 12 mg/dL (ref 8–23)
CO2: 29 mmol/L (ref 22–32)
Calcium: 10.9 mg/dL — ABNORMAL HIGH (ref 8.9–10.3)
Chloride: 103 mmol/L (ref 98–111)
Creatinine: 1.3 mg/dL — ABNORMAL HIGH (ref 0.44–1.00)
GFR, Est AFR Am: 47 mL/min — ABNORMAL LOW (ref >60)
GFR, Estimated: 41 mL/min — ABNORMAL LOW (ref >60)
Glucose, Bld: 275 mg/dL — ABNORMAL HIGH (ref 70–99)
Potassium: 3.7 mmol/L (ref 3.5–5.1)
Sodium: 137 mmol/L (ref 135–145)
Total Bilirubin: 0.8 mg/dL (ref 0.3–1.2)
Total Protein: 5.6 g/dL — ABNORMAL LOW (ref 6.5–8.1)

## 2020-05-11 LAB — CBC WITH DIFFERENTIAL (CANCER CENTER ONLY)
Abs Immature Granulocytes: 0.02 10*3/uL (ref 0.00–0.07)
Basophils Absolute: 0 10*3/uL (ref 0.0–0.1)
Basophils Relative: 0 %
Eosinophils Absolute: 0.1 10*3/uL (ref 0.0–0.5)
Eosinophils Relative: 2 %
HCT: 36.6 % (ref 36.0–46.0)
Hemoglobin: 11.5 g/dL — ABNORMAL LOW (ref 12.0–15.0)
Immature Granulocytes: 0 %
Lymphocytes Relative: 49 %
Lymphs Abs: 2.9 10*3/uL (ref 0.7–4.0)
MCH: 24.1 pg — ABNORMAL LOW (ref 26.0–34.0)
MCHC: 31.4 g/dL (ref 30.0–36.0)
MCV: 76.6 fL — ABNORMAL LOW (ref 80.0–100.0)
Monocytes Absolute: 0.8 10*3/uL (ref 0.1–1.0)
Monocytes Relative: 14 %
Neutro Abs: 2.1 10*3/uL (ref 1.7–7.7)
Neutrophils Relative %: 35 %
Platelet Count: 196 10*3/uL (ref 150–400)
RBC: 4.78 MIL/uL (ref 3.87–5.11)
RDW: 14.1 % (ref 11.5–15.5)
WBC Count: 5.9 10*3/uL (ref 4.0–10.5)
nRBC: 0 % (ref 0.0–0.2)

## 2020-05-11 LAB — LACTATE DEHYDROGENASE: LDH: 146 U/L (ref 98–192)

## 2020-05-11 MED ORDER — SODIUM CHLORIDE 0.9% FLUSH
10.0000 mL | INTRAVENOUS | Status: DC | PRN
  Administered 2020-05-11: 10 mL via INTRAVENOUS
  Filled 2020-05-11: qty 10

## 2020-05-11 MED ORDER — HEPARIN SOD (PORK) LOCK FLUSH 100 UNIT/ML IV SOLN
500.0000 [IU] | Freq: Once | INTRAVENOUS | Status: AC
  Administered 2020-05-11: 500 [IU] via INTRAVENOUS
  Filled 2020-05-11: qty 5

## 2020-05-11 NOTE — Patient Instructions (Signed)
Implanted Port Insertion, Care After °This sheet gives you information about how to care for yourself after your procedure. Your health care provider may also give you more specific instructions. If you have problems or questions, contact your health care provider. °What can I expect after the procedure? °After the procedure, it is common to have: °· Discomfort at the port insertion site. °· Bruising on the skin over the port. This should improve over 3-4 days. °Follow these instructions at home: °Port care °· After your port is placed, you will get a manufacturer's information card. The card has information about your port. Keep this card with you at all times. °· Take care of the port as told by your health care provider. Ask your health care provider if you or a family member can get training for taking care of the port at home. A home health care nurse may also take care of the port. °· Make sure to remember what type of port you have. °Incision care ° °  ° °· Follow instructions from your health care provider about how to take care of your port insertion site. Make sure you: °? Wash your hands with soap and water before and after you change your bandage (dressing). If soap and water are not available, use hand sanitizer. °? Change your dressing as told by your health care provider. °? Leave stitches (sutures), skin glue, or adhesive strips in place. These skin closures may need to stay in place for 2 weeks or longer. If adhesive strip edges start to loosen and curl up, you may trim the loose edges. Do not remove adhesive strips completely unless your health care provider tells you to do that. °· Check your port insertion site every day for signs of infection. Check for: °? Redness, swelling, or pain. °? Fluid or blood. °? Warmth. °? Pus or a bad smell. °Activity °· Return to your normal activities as told by your health care provider. Ask your health care provider what activities are safe for you. °· Do not  lift anything that is heavier than 10 lb (4.5 kg), or the limit that you are told, until your health care provider says that it is safe. °General instructions °· Take over-the-counter and prescription medicines only as told by your health care provider. °· Do not take baths, swim, or use a hot tub until your health care provider approves. Ask your health care provider if you may take showers. You may only be allowed to take sponge baths. °· Do not drive for 24 hours if you were given a sedative during your procedure. °· Wear a medical alert bracelet in case of an emergency. This will tell any health care providers that you have a port. °· Keep all follow-up visits as told by your health care provider. This is important. °Contact a health care provider if: °· You cannot flush your port with saline as directed, or you cannot draw blood from the port. °· You have a fever or chills. °· You have redness, swelling, or pain around your port insertion site. °· You have fluid or blood coming from your port insertion site. °· Your port insertion site feels warm to the touch. °· You have pus or a bad smell coming from the port insertion site. °Get help right away if: °· You have chest pain or shortness of breath. °· You have bleeding from your port that you cannot control. °Summary °· Take care of the port as told by your health   care provider. Keep the manufacturer's information card with you at all times. °· Change your dressing as told by your health care provider. °· Contact a health care provider if you have a fever or chills or if you have redness, swelling, or pain around your port insertion site. °· Keep all follow-up visits as told by your health care provider. °This information is not intended to replace advice given to you by your health care provider. Make sure you discuss any questions you have with your health care provider. °Document Revised: 04/15/2018 Document Reviewed: 04/15/2018 °Elsevier Patient Education ©  2020 [employer] ° °

## 2020-05-11 NOTE — Progress Notes (Signed)
Hematology and Oncology Follow Up Visit  Megan Tate [id number] [birth year] 74 y.o. 05/11/2020   Principle Diagnosis:  Chronic lymphocytic leukemia- Trisomy 12  Past Therapy:             Status post 6 cycles of Gazyva/Bendamustine - completed4/2016 Maintenance Gazyva every 3 months s/p cycle 11 -completed 2 years in June 2018  Current Therapy:   Observation   Interim History:  Megan Tate is here today for follow-up.  She is doing quite well.  She really has not had many complaints.  Her blood sugar still is on the high side.  I know that she is doing her best to try to control it.  When I listen to her heart, it certainly sounded as if she had some irregularities.  Because of this, we did do a EKG on her.  The EKG showed sinus bradycardia with some PACs.  She really is not symptomatic with this.  She not have any fatigue.  Her blood pressure is fine.  She is having no syncope.  There is no bleeding.  She has had no leg swelling.  She has no issues with neuropathy.  We will flush her Port-A-Cath today.  Overall, her performance status is ECOG 1.     Medications:  Allergies as of 05/11/2020      Reactions   Tylenol [acetaminophen] Other (See Comments)   Messes with stomach      Medication List       Accurate as of May 11, 2020 10:18 AM. If you have any questions, ask your nurse or doctor.        amLODipine 10 MG tablet Commonly known as: NORVASC Take 1 tablet (10 mg total) by mouth daily.   BD Pen Needle Nano U/F 32G X 4 MM Misc Generic drug: Insulin Pen Needle PATIENT IS TO CHECK BLOOD SUGAR DAILY BEFORE MEALS AND BEDTIME. DX. E11.9   cloNIDine 0.2 MG tablet Commonly known as: CATAPRES Take 0.2 mg by mouth 2 (two) times daily.   dorzolamide-timolol 22.3-6.8 MG/ML ophthalmic solution Commonly known as: COSOPT Place 1 drop into both eyes 2 (two) times daily.   doxycycline 100 MG tablet Commonly known as: VIBRA-TABS Take 1 tablet (100 mg total) by mouth 2  (two) times daily.   Ferrous Sulfate 50 MG Tbcr Commonly known as: Slow Release Iron Take 1 tablet by mouth daily.   fluconazole 100 MG tablet Commonly known as: DIFLUCAN Take 1 tablet (100 mg total) by mouth daily.   fluticasone 50 MCG/ACT nasal spray Commonly known as: FLONASE Place 2 sprays into both nostrils daily.   folic acid 1 MG tablet Commonly known as: FOLVITE TAKE 2 TABLETS BY MOUTH EVERY DAY   Fusion Plus Caps Take 130 mg by mouth every morning.   glucose blood test strip Commonly known as: ONE TOUCH ULTRA TEST USE TO TEST BLOOD SUGAR BEFORE MEALS AND AT BEDTIME   OneTouch Ultra test strip Generic drug: glucose blood Use to check blood glucose qid.  Diagnosis: E11.9   Janumet XR 50-1000 MG Tb24 Generic drug: SitaGLIPtin-MetFORMIN HCl TAKE 2 TABLETS BY MOUTH EVERY DAY   Levemir FlexTouch 100 UNIT/ML FlexPen Generic drug: insulin detemir Inject 25 Units into the skin daily.   lidocaine-prilocaine cream Commonly known as: EMLA Apply to Port-A-Cath site 1 hour prior to treatment   Linzess 145 MCG Caps capsule Generic drug: linaclotide Take 1 capsule by mouth daily.   losartan 100 MG tablet Commonly known as: COZAAR TAKE 1 TABLET BY  MOUTH EVERY DAY   meclizine 25 MG tablet Commonly known as: ANTIVERT Take 1 tablet (25 mg total) by mouth 3 (three) times daily as needed for dizziness.   metoprolol succinate 50 MG 24 hr tablet Commonly known as: TOPROL-XL Take 1 tablet (50 mg total) by mouth daily. Take with or immediately following a meal.   metoprolol 200 MG 24 hr tablet Commonly known as: TOPROL-XL TAKE 1 TABLET BY MOUTH EVERY DAY   ondansetron 4 MG disintegrating tablet Commonly known as: Zofran ODT Take 1 tablet (4 mg total) by mouth every 8 (eight) hours as needed for nausea or vomiting.   ONE TOUCH ULTRA SYSTEM KIT w/Device Kit 1 kit by Does not apply route once. Check blood sugar daily before meals and at bedtime.   ONE TOUCH ULTRA 2  w/Device Kit Use to check blood glucose qid.  Diagnosis: V69.4   OneTouch Delica Plus [id number] Misc 1 Device by Does not apply route 4 (four) times daily. Use to check blood glucose qid.  Diagnosis: E11.9   rosuvastatin 20 MG tablet Commonly known as: CRESTOR TAKE 1 TABLET BY MOUTH EVERY DAY   spironolactone 50 MG tablet Commonly known as: ALDACTONE TAKE 1 TABLET BY MOUTH EVERY DAY   tiZANidine 2 MG tablet Commonly known as: ZANAFLEX Take 1 tablet (2 mg total) by mouth every 8 (eight) hours as needed for muscle spasms.   UNABLE TO FIND One Touch Ultra Blood Glucose Monitoring Meter. Dx E11.9       Allergies:  Allergies  Allergen Reactions  . Tylenol [Acetaminophen] Other (See Comments)    Messes with stomach    Past Medical History, Surgical history, Social history, and Family History were reviewed and updated.  Review of Systems: Review of Systems  Constitutional: Negative.   HENT: Negative.   Eyes: Negative.   Respiratory: Negative.   Cardiovascular: Negative.   Gastrointestinal: Negative.   Genitourinary: Negative.   Musculoskeletal: Negative.   Skin: Negative.   Neurological: Negative.   Endo/Heme/Allergies: Negative.   Psychiatric/Behavioral: Negative.      Physical Exam:  height is '5\' 4"'  (1.626 m) and weight is 219 lb (99.3 kg). Her oral temperature is 98.6 F (37 C). Her blood pressure is 161/85 (abnormal) and her pulse is 68. Her respiration is 17 and oxygen saturation is 98%.   Wt Readings from Last 3 Encounters:  05/11/20 219 lb (99.3 kg)  04/05/20 209 lb 11.2 oz (95.1 kg)  04/01/20 216 lb (98 kg)    Physical Exam Vitals reviewed.  HENT:     Head: Normocephalic and atraumatic.  Eyes:     Pupils: Pupils are equal, round, and reactive to light.  Cardiovascular:     Rate and Rhythm: Normal rate and regular rhythm.     Heart sounds: Normal heart sounds.  Pulmonary:     Effort: Pulmonary effort is normal.     Breath sounds: Normal breath  sounds.  Abdominal:     General: Bowel sounds are normal.     Palpations: Abdomen is soft.  Musculoskeletal:        General: No tenderness or deformity. Normal range of motion.     Cervical back: Normal range of motion.  Lymphadenopathy:     Cervical: No cervical adenopathy.  Skin:    General: Skin is warm and dry.     Findings: No erythema or rash.  Neurological:     Mental Status: She is alert and oriented to person, place, and time.  Psychiatric:  Behavior: Behavior normal.        Thought Content: Thought content normal.        Judgment: Judgment normal.      Lab Results  Component Value Date   WBC 5.9 05/11/2020   HGB 11.5 (L) 05/11/2020   HCT 36.6 05/11/2020   MCV 76.6 (L) 05/11/2020   PLT 196 05/11/2020   Lab Results  Component Value Date   FERRITIN 442 (H) 07/31/2018   IRON 88 07/31/2018   TIBC 253 07/31/2018   UIBC 165 07/31/2018   IRONPCTSAT 35 07/31/2018   Lab Results  Component Value Date   RETICCTPCT 1.1 05/08/2018   RBC 4.78 05/11/2020   RETICCTABS 65.4 04/07/2014   Lab Results  Component Value Date   KPAFRELGTCHN 12.7 01/04/2020   LAMBDASER 5.9 01/04/2020   KAPLAMBRATIO 2.15 (H) 01/04/2020   Lab Results  Component Value Date   IGGSERUM 465 (L) 01/04/2020   IGA 23 (L) 01/04/2020   IGMSERUM 6 (L) 01/04/2020   Lab Results  Component Value Date   TOTALPROTELP 5.5 (L) 01/04/2020   ALBUMINELP 3.2 01/04/2020   A1GS 0.2 01/04/2020   A2GS 0.8 01/04/2020   BETS 0.8 01/04/2020   BETA2SER 0.2 03/30/2015   GAMS 0.4 01/04/2020   MSPIKE Not Observed 01/04/2020   SPEI * 03/30/2015     Chemistry      Component Value Date/Time   NA 137 05/11/2020 0931   NA 142 09/05/2017 0751   K 3.7 05/11/2020 0931   K 3.8 09/05/2017 0751   CL 103 05/11/2020 0931   CL 104 09/05/2017 0751   CO2 29 05/11/2020 0931   CO2 27 09/05/2017 0751   BUN 12 05/11/2020 0931   BUN 11 09/05/2017 0751   CREATININE 1.30 (H) 05/11/2020 0931   CREATININE 1.1  09/05/2017 0751      Component Value Date/Time   CALCIUM 10.9 (H) 05/11/2020 0931   CALCIUM 10.8 (H) 09/05/2017 0751   ALKPHOS 90 05/11/2020 0931   ALKPHOS 88 (H) 09/05/2017 0751   AST 10 (L) 05/11/2020 0931   ALT 10 05/11/2020 0931   ALT 14 09/05/2017 0751   BILITOT 0.8 05/11/2020 0931      Impression and Plan: Ms. Kaminski is a pleasant 74 yo African American female with chronic lymphocytic leukemia. She completed treatment with Gazyva/Bendamustine in April 2016 and maintenance Gazyva in June 2018.   I forgot to mention that she did have a rash we last saw her.  We do put her on some doxycycline which seemed to help quite a bit.  We will plan to get her back in another 6 weeks.  She likes to come in every 6 weeks.  We will flush her Port-A-Cath when she gets in.    Volanda Napoleon, MD 8/11/202110:18 AM

## 2020-06-01 ENCOUNTER — Other Ambulatory Visit: Payer: Self-pay | Admitting: Family Medicine

## 2020-06-01 DIAGNOSIS — C9110 Chronic lymphocytic leukemia of B-cell type not having achieved remission: Secondary | ICD-10-CM

## 2020-06-02 ENCOUNTER — Other Ambulatory Visit: Payer: Self-pay | Admitting: Family Medicine

## 2020-06-02 DIAGNOSIS — C9110 Chronic lymphocytic leukemia of B-cell type not having achieved remission: Secondary | ICD-10-CM

## 2020-06-13 ENCOUNTER — Telehealth: Payer: Self-pay

## 2020-06-13 NOTE — Telephone Encounter (Signed)
Please have her make a follow up with me to see how her BP and HR are looking and discuss change in medication.

## 2020-06-13 NOTE — Telephone Encounter (Signed)
Pt lvm stating amlodipine was making her sick with stomach issues and head 'issues'. States she had been told to stop taking clonidine. However, she stopped taking the amlodipine and took clonidine over the weekend.   She would like to know if the amlodipine can be changed.

## 2020-06-14 ENCOUNTER — Ambulatory Visit (INDEPENDENT_AMBULATORY_CARE_PROVIDER_SITE_OTHER): Payer: Medicare Other | Admitting: Family Medicine

## 2020-06-14 ENCOUNTER — Other Ambulatory Visit: Payer: Self-pay

## 2020-06-14 ENCOUNTER — Encounter: Payer: Self-pay | Admitting: Family Medicine

## 2020-06-14 DIAGNOSIS — I10 Essential (primary) hypertension: Secondary | ICD-10-CM | POA: Diagnosis not present

## 2020-06-14 NOTE — Patient Instructions (Signed)
It is ok to stop amlodipine and continue clonidine at current dose.   Please follow up with me again in about 6 weeks.

## 2020-06-14 NOTE — Telephone Encounter (Signed)
Spoke to Ms. Robers. Rescheduled her 9/20 appt to today 9/14 @ 3pm

## 2020-06-14 NOTE — Assessment & Plan Note (Signed)
She has restarted clonidine and discontinued amlodipine due to side effects.  She is not bradycardic at this time.  She will continue on current medications of clonidine, losartan, metoprolol and aldactone F/u in 6 weeks to be sure vitals remain stable.

## 2020-06-14 NOTE — Progress Notes (Signed)
Megan Tate - 74 y.o. female MRN [id number]  Date of birth: [birth year]  Subjective Chief Complaint  Patient presents with  . Hypertension  . Bradycardia    HPI Megan Tate is a 74 y.o. female here today for follow up of HTN.  She reports that when she takes amlodipine she has nausea and headache.  She felt better taking clonidine and has stopped amlodipine since this weekend.  She did have bradycardia previously possibly related to clonidine.  Her metoprolol was also decreased.  Her bradycardia had improved and doesn't seem to be an issue since she restarted clonidine.  Her BP is well controlled today.    ROS:  A comprehensive ROS was completed and negative except as noted per HPI  Allergies  Allergen Reactions  . Tylenol [Acetaminophen] Other (See Comments)    Messes with stomach    Past Medical History:  Diagnosis Date  . Cancer ([place]) CLL  . CLL (chronic lymphocytic leukemia) ([place]) 08/16/2014  . Diabetes mellitus without complication ([place])   . Hypertension     History reviewed. No pertinent surgical history.  Social History   Socioeconomic History  . Marital status: Single    Spouse name: Not on file  . Number of children: Not on file  . Years of education: Not on file  . Highest education level: Not on file  Occupational History  . Not on file  Tobacco Use  . Smoking status: Never Smoker  . Smokeless tobacco: Never Used  . Tobacco comment: never used tobacco  Vaping Use  . Vaping Use: Never used  Substance and Sexual Activity  . Alcohol use: Never    Alcohol/week: 0.0 standard drinks  . Drug use: Never  . Sexual activity: Not on file  Other Topics Concern  . Not on file  Social History Narrative  . Not on file   Social Determinants of Health   Financial Resource Strain:   . Difficulty of Paying Living Expenses: Not on file  Food Insecurity:   . Worried About Charity fundraiser in the Last Year: Not on file  . Ran Out of Food in the Last Year: Not on  file  Transportation Needs:   . Lack of Transportation (Medical): Not on file  . Lack of Transportation (Non-Medical): Not on file  Physical Activity:   . Days of Exercise per Week: Not on file  . Minutes of Exercise per Session: Not on file  Stress:   . Feeling of Stress : Not on file  Social Connections:   . Frequency of Communication with Friends and Family: Not on file  . Frequency of Social Gatherings with Friends and Family: Not on file  . Attends Religious Services: Not on file  . Active Member of Clubs or Organizations: Not on file  . Attends Archivist Meetings: Not on file  . Marital Status: Not on file    History reviewed. No pertinent family history.  Health Maintenance  Topic Date Due  . FOOT EXAM  Never done  . COVID-19 Vaccine (1) Never done  . COLONOSCOPY  Never done  . DEXA SCAN  Never done  . OPHTHALMOLOGY EXAM  04/13/2020  . INFLUENZA VACCINE  Never done  . TETANUS/TDAP  04/18/2021 (Originally 03/10/1966)  . PNA vac Low Risk Adult (1 of 2 - PCV13) 04/18/2021 (Originally 03/10/2012)  . MAMMOGRAM  08/05/2020  . HEMOGLOBIN A1C  10/02/2020  . Hepatitis C Screening  Completed     ----------------------------------------------------------------------------------------------------------------------------------------------------------------------------------------------------------------- Physical Exam BP  136/84 (BP Location: Left Arm, Patient Position: Sitting, Cuff Size: Large)   Pulse 67   Wt 214 lb 3.2 oz (97.2 kg)   SpO2 97%   BMI 36.77 kg/m   Physical Exam Constitutional:      Appearance: Normal appearance.  Cardiovascular:     Rate and Rhythm: Normal rate and regular rhythm.  Pulmonary:     Effort: Pulmonary effort is normal.     Breath sounds: Normal breath sounds.  Neurological:     General: No focal deficit present.     Mental Status: She is alert.  Psychiatric:        Mood and Affect: Mood normal.        Behavior: Behavior  normal.     ------------------------------------------------------------------------------------------------------------------------------------------------------------------------------------------------------------------- Assessment and Plan  Benign essential hypertension She has restarted clonidine and discontinued amlodipine due to side effects.  She is not bradycardic at this time.  She will continue on current medications of clonidine, losartan, metoprolol and aldactone F/u in 6 weeks to be sure vitals remain stable.     No orders of the defined types were placed in this encounter.   Return in about 6 weeks (around 07/26/2020) for HTN.    This visit occurred during the SARS-CoV-2 public health emergency.  Safety protocols were in place, including screening questions prior to the visit, additional usage of staff PPE, and extensive cleaning of exam room while observing appropriate contact time as indicated for disinfecting solutions.

## 2020-06-20 ENCOUNTER — Ambulatory Visit: Payer: Medicare Other | Admitting: Family Medicine

## 2020-06-21 ENCOUNTER — Other Ambulatory Visit: Payer: Self-pay | Admitting: Family Medicine

## 2020-06-21 DIAGNOSIS — C9110 Chronic lymphocytic leukemia of B-cell type not having achieved remission: Secondary | ICD-10-CM

## 2020-06-21 NOTE — Telephone Encounter (Signed)
I'm hanging in there Megan Tate and hope that you're doing okay! Thanks!

## 2020-06-21 NOTE — Telephone Encounter (Signed)
Hi Cody,  This lady is seeing you.

## 2020-06-21 NOTE — Telephone Encounter (Signed)
Thanks Bill!  Hope you are doing well!

## 2020-06-27 ENCOUNTER — Other Ambulatory Visit: Payer: Self-pay

## 2020-06-27 ENCOUNTER — Inpatient Hospital Stay: Payer: Medicare Other | Attending: Hematology & Oncology | Admitting: Hematology & Oncology

## 2020-06-27 ENCOUNTER — Encounter: Payer: Self-pay | Admitting: Hematology & Oncology

## 2020-06-27 ENCOUNTER — Inpatient Hospital Stay: Payer: Medicare Other

## 2020-06-27 VITALS — BP 155/65 | HR 58 | Temp 98.4°F | Resp 18 | Wt 214.0 lb

## 2020-06-27 DIAGNOSIS — Z79899 Other long term (current) drug therapy: Secondary | ICD-10-CM | POA: Insufficient documentation

## 2020-06-27 DIAGNOSIS — C9110 Chronic lymphocytic leukemia of B-cell type not having achieved remission: Secondary | ICD-10-CM

## 2020-06-27 DIAGNOSIS — D500 Iron deficiency anemia secondary to blood loss (chronic): Secondary | ICD-10-CM | POA: Diagnosis not present

## 2020-06-27 DIAGNOSIS — E119 Type 2 diabetes mellitus without complications: Secondary | ICD-10-CM | POA: Diagnosis not present

## 2020-06-27 DIAGNOSIS — Z794 Long term (current) use of insulin: Secondary | ICD-10-CM

## 2020-06-27 DIAGNOSIS — Z95828 Presence of other vascular implants and grafts: Secondary | ICD-10-CM

## 2020-06-27 LAB — CBC WITH DIFFERENTIAL (CANCER CENTER ONLY)
Abs Immature Granulocytes: 0.02 10*3/uL (ref 0.00–0.07)
Basophils Absolute: 0 10*3/uL (ref 0.0–0.1)
Basophils Relative: 0 %
Eosinophils Absolute: 0.1 10*3/uL (ref 0.0–0.5)
Eosinophils Relative: 2 %
HCT: 35.9 % — ABNORMAL LOW (ref 36.0–46.0)
Hemoglobin: 11.5 g/dL — ABNORMAL LOW (ref 12.0–15.0)
Immature Granulocytes: 0 %
Lymphocytes Relative: 49 %
Lymphs Abs: 3 10*3/uL (ref 0.7–4.0)
MCH: 24.3 pg — ABNORMAL LOW (ref 26.0–34.0)
MCHC: 32 g/dL (ref 30.0–36.0)
MCV: 75.9 fL — ABNORMAL LOW (ref 80.0–100.0)
Monocytes Absolute: 0.9 10*3/uL (ref 0.1–1.0)
Monocytes Relative: 14 %
Neutro Abs: 2.2 10*3/uL (ref 1.7–7.7)
Neutrophils Relative %: 35 %
Platelet Count: 178 10*3/uL (ref 150–400)
RBC: 4.73 MIL/uL (ref 3.87–5.11)
RDW: 13.2 % (ref 11.5–15.5)
WBC Count: 6.2 10*3/uL (ref 4.0–10.5)
nRBC: 0 % (ref 0.0–0.2)

## 2020-06-27 LAB — CMP (CANCER CENTER ONLY)
ALT: 6 U/L (ref 0–44)
AST: 9 U/L — ABNORMAL LOW (ref 15–41)
Albumin: 3.8 g/dL (ref 3.5–5.0)
Alkaline Phosphatase: 74 U/L (ref 38–126)
Anion gap: 6 (ref 5–15)
BUN: 14 mg/dL (ref 8–23)
CO2: 29 mmol/L (ref 22–32)
Calcium: 10.9 mg/dL — ABNORMAL HIGH (ref 8.9–10.3)
Chloride: 102 mmol/L (ref 98–111)
Creatinine: 1.27 mg/dL — ABNORMAL HIGH (ref 0.44–1.00)
GFR, Est AFR Am: 48 mL/min — ABNORMAL LOW (ref >60)
GFR, Estimated: 42 mL/min — ABNORMAL LOW (ref >60)
Glucose, Bld: 231 mg/dL — ABNORMAL HIGH (ref 70–99)
Potassium: 3.8 mmol/L (ref 3.5–5.1)
Sodium: 137 mmol/L (ref 135–145)
Total Bilirubin: 0.6 mg/dL (ref 0.3–1.2)
Total Protein: 5.8 g/dL — ABNORMAL LOW (ref 6.5–8.1)

## 2020-06-27 MED ORDER — HEPARIN SOD (PORK) LOCK FLUSH 100 UNIT/ML IV SOLN
500.0000 [IU] | Freq: Once | INTRAVENOUS | Status: AC
  Administered 2020-06-27: 500 [IU] via INTRAVENOUS
  Filled 2020-06-27: qty 5

## 2020-06-27 MED ORDER — SODIUM CHLORIDE 0.9% FLUSH
10.0000 mL | INTRAVENOUS | Status: DC | PRN
  Administered 2020-06-27: 10 mL via INTRAVENOUS
  Filled 2020-06-27: qty 10

## 2020-06-27 NOTE — Progress Notes (Signed)
Hematology and Oncology Follow Up Visit  Megan Tate [id number] 05-10-1946 74 y.o. 06/27/2020   Principle Diagnosis:  Chronic lymphocytic leukemia- Trisomy 12  Past Therapy:             Status post 6 cycles of Gazyva/Bendamustine - completed4/2016 Maintenance Gazyva every 3 months s/p cycle 11 -completed 2 years in June 2018  Current Therapy:   Observation   Interim History:  Megan Tate is here today for follow-up.  She does not feel all that well.  She just feels tired.  Is hard to say why she does feel tired.  We do not have back her blood sugars yet.  One would have to think that this would possibly be from her diabetes.  We checked her thyroid back in late June.  Her TSH was normal 0.95.  She has had no problems with bowels or bladder.  She has had no fever.  I do not think she has had the coronavirus vaccine..  She has had no rashes.  There is been no leg swelling.  She has not noted any increase or swelling lymph nodes.  Currently, her performance status is ECOG 1.    Medications:  Allergies as of 06/27/2020      Reactions   Tylenol [acetaminophen] Other (See Comments)   Messes with stomach      Medication List       Accurate as of June 27, 2020  8:37 AM. If you have any questions, ask your nurse or doctor.        amLODipine 5 MG tablet Commonly known as: NORVASC Take 5 mg by mouth daily.   BD Pen Needle Nano U/F 32G X 4 MM Misc Generic drug: Insulin Pen Needle PATIENT IS TO CHECK BLOOD SUGAR DAILY BEFORE MEALS AND BEDTIME. DX. E11.9   cloNIDine 0.2 MG tablet Commonly known as: CATAPRES Take 0.2 mg by mouth 2 (two) times daily.   dorzolamide-timolol 22.3-6.8 MG/ML ophthalmic solution Commonly known as: COSOPT Place 1 drop into both eyes 2 (two) times daily.   folic acid 1 MG tablet Commonly known as: FOLVITE TAKE 2 TABLETS BY MOUTH EVERY DAY   glucose blood test strip Commonly known as: ONE TOUCH ULTRA TEST USE TO TEST BLOOD SUGAR BEFORE  MEALS AND AT BEDTIME   Janumet XR 50-1000 MG Tb24 Generic drug: SitaGLIPtin-MetFORMIN HCl TAKE 2 TABLETS BY MOUTH EVERY DAY   Levemir FlexTouch 100 UNIT/ML FlexPen Generic drug: insulin detemir Inject 25 Units into the skin daily.   Linzess 145 MCG Caps capsule Generic drug: linaclotide Take 1 capsule by mouth daily.   losartan 100 MG tablet Commonly known as: COZAAR TAKE 1 TABLET BY MOUTH EVERY DAY   meclizine 25 MG tablet Commonly known as: ANTIVERT Take 1 tablet (25 mg total) by mouth 3 (three) times daily as needed for dizziness.   metoprolol succinate 50 MG 24 hr tablet Commonly known as: TOPROL-XL Take 1 tablet (50 mg total) by mouth daily. Take with or immediately following a meal.   ONE TOUCH ULTRA 2 w/Device Kit Use to check blood glucose qid.  Diagnosis: V03.5   OneTouch Delica Plus [id number] Misc 1 Device by Does not apply route 4 (four) times daily. Use to check blood glucose qid.  Diagnosis: E11.9   rosuvastatin 20 MG tablet Commonly known as: CRESTOR TAKE 1 TABLET BY MOUTH EVERY DAY   spironolactone 50 MG tablet Commonly known as: ALDACTONE TAKE 1 TABLET BY MOUTH EVERY DAY  Allergies:  Allergies  Allergen Reactions  . Tylenol [Acetaminophen] Other (See Comments)    Messes with stomach    Past Medical History, Surgical history, Social history, and Family History were reviewed and updated.  Review of Systems: Review of Systems  Constitutional: Negative.   HENT: Negative.   Eyes: Negative.   Respiratory: Negative.   Cardiovascular: Negative.   Gastrointestinal: Negative.   Genitourinary: Negative.   Musculoskeletal: Negative.   Skin: Negative.   Neurological: Negative.   Endo/Heme/Allergies: Negative.   Psychiatric/Behavioral: Negative.      Physical Exam:  weight is 214 lb (97.1 kg). Her oral temperature is 98.4 F (36.9 C). Her blood pressure is 155/65 (abnormal) and her pulse is 58 (abnormal). Her respiration is 18 and oxygen  saturation is 100%.   Wt Readings from Last 3 Encounters:  06/27/20 214 lb (97.1 kg)  06/14/20 214 lb 3.2 oz (97.2 kg)  05/11/20 219 lb (99.3 kg)    Physical Exam Vitals reviewed.  HENT:     Head: Normocephalic and atraumatic.  Eyes:     Pupils: Pupils are equal, round, and reactive to light.  Cardiovascular:     Rate and Rhythm: Normal rate and regular rhythm.     Heart sounds: Normal heart sounds.  Pulmonary:     Effort: Pulmonary effort is normal.     Breath sounds: Normal breath sounds.  Abdominal:     General: Bowel sounds are normal.     Palpations: Abdomen is soft.  Musculoskeletal:        General: No tenderness or deformity. Normal range of motion.     Cervical back: Normal range of motion.  Lymphadenopathy:     Cervical: No cervical adenopathy.  Skin:    General: Skin is warm and dry.     Findings: No erythema or rash.  Neurological:     Mental Status: She is alert and oriented to person, place, and time.  Psychiatric:        Behavior: Behavior normal.        Thought Content: Thought content normal.        Judgment: Judgment normal.      Lab Results  Component Value Date   WBC 6.2 06/27/2020   HGB 11.5 (L) 06/27/2020   HCT 35.9 (L) 06/27/2020   MCV 75.9 (L) 06/27/2020   PLT 178 06/27/2020   Lab Results  Component Value Date   FERRITIN 442 (H) 07/31/2018   IRON 88 07/31/2018   TIBC 253 07/31/2018   UIBC 165 07/31/2018   IRONPCTSAT 35 07/31/2018   Lab Results  Component Value Date   RETICCTPCT 1.1 05/08/2018   RBC 4.73 06/27/2020   RETICCTABS 65.4 04/07/2014   Lab Results  Component Value Date   KPAFRELGTCHN 12.7 01/04/2020   LAMBDASER 5.9 01/04/2020   KAPLAMBRATIO 2.15 (H) 01/04/2020   Lab Results  Component Value Date   IGGSERUM 465 (L) 01/04/2020   IGA 23 (L) 01/04/2020   IGMSERUM 6 (L) 01/04/2020   Lab Results  Component Value Date   TOTALPROTELP 5.5 (L) 01/04/2020   ALBUMINELP 3.2 01/04/2020   A1GS 0.2 01/04/2020   A2GS 0.8  01/04/2020   BETS 0.8 01/04/2020   BETA2SER 0.2 03/30/2015   GAMS 0.4 01/04/2020   MSPIKE Not Observed 01/04/2020   SPEI * 03/30/2015     Chemistry      Component Value Date/Time   NA 137 05/11/2020 0931   NA 142 09/05/2017 0751   K 3.7 05/11/2020 0931  K 3.8 09/05/2017 0751   CL 103 05/11/2020 0931   CL 104 09/05/2017 0751   CO2 29 05/11/2020 0931   CO2 27 09/05/2017 0751   BUN 12 05/11/2020 0931   BUN 11 09/05/2017 0751   CREATININE 1.30 (H) 05/11/2020 0931   CREATININE 1.1 09/05/2017 0751      Component Value Date/Time   CALCIUM 10.9 (H) 05/11/2020 0931   CALCIUM 10.8 (H) 09/05/2017 0751   ALKPHOS 90 05/11/2020 0931   ALKPHOS 88 (H) 09/05/2017 0751   AST 10 (L) 05/11/2020 0931   ALT 10 05/11/2020 0931   ALT 14 09/05/2017 0751   BILITOT 0.8 05/11/2020 0931      Impression and Plan: Megan Tate is a pleasant 74 yo African American female with chronic lymphocytic leukemia. She completed treatment with Gazyva/Bendamustine in April 2016 and maintenance Gazyva in June 2018.   I will be very interested to see what the electrolytes are.  Again, her CBC really looks stable.  The lymphocytes have not gone up.  I do not think we need any scans on her. Her last scans were in February.  Everything looked fine at that point.  Again, we really need to see what her electrolytes and blood sugar is.  As always, we will plan to get her back in 6 weeks.  I want to make sure we see her back before all of the holidays.     Volanda Napoleon, MD 9/27/20218:37 AM

## 2020-06-28 LAB — PROTEIN ELECTROPHORESIS, SERUM, WITH REFLEX
A/G Ratio: 1.5 (ref 0.7–1.7)
Albumin ELP: 3.3 g/dL (ref 2.9–4.4)
Alpha-1-Globulin: 0.2 g/dL (ref 0.0–0.4)
Alpha-2-Globulin: 0.9 g/dL (ref 0.4–1.0)
Beta Globulin: 0.7 g/dL (ref 0.7–1.3)
Gamma Globulin: 0.4 g/dL (ref 0.4–1.8)
Globulin, Total: 2.2 g/dL (ref 2.2–3.9)
Total Protein ELP: 5.5 g/dL — ABNORMAL LOW (ref 6.0–8.5)

## 2020-06-28 LAB — IGG, IGA, IGM
IgA: 21 mg/dL — ABNORMAL LOW (ref 64–422)
IgG (Immunoglobin G), Serum: 457 mg/dL — ABNORMAL LOW (ref 586–1602)
IgM (Immunoglobulin M), Srm: <5 mg/dL — ABNORMAL LOW (ref 26–217)

## 2020-06-28 LAB — KAPPA/LAMBDA LIGHT CHAINS
Kappa free light chain: 10.8 mg/L (ref 3.3–19.4)
Kappa, lambda light chain ratio: 2.92 — ABNORMAL HIGH (ref 0.26–1.65)
Lambda free light chains: 3.7 mg/L — ABNORMAL LOW (ref 5.7–26.3)

## 2020-07-10 ENCOUNTER — Emergency Department (HOSPITAL_BASED_OUTPATIENT_CLINIC_OR_DEPARTMENT_OTHER): Payer: No Typology Code available for payment source

## 2020-07-10 ENCOUNTER — Emergency Department (HOSPITAL_BASED_OUTPATIENT_CLINIC_OR_DEPARTMENT_OTHER)
Admission: EM | Admit: 2020-07-10 | Discharge: 2020-07-10 | Disposition: A | Discharge status: Home / Self Care | Payer: No Typology Code available for payment source | Attending: Emergency Medicine | Admitting: Emergency Medicine

## 2020-07-10 ENCOUNTER — Encounter (HOSPITAL_BASED_OUTPATIENT_CLINIC_OR_DEPARTMENT_OTHER): Payer: Self-pay | Admitting: Emergency Medicine

## 2020-07-10 ENCOUNTER — Other Ambulatory Visit: Payer: Self-pay

## 2020-07-10 DIAGNOSIS — Y92481 Parking lot as the place of occurrence of the external cause: Secondary | ICD-10-CM | POA: Diagnosis not present

## 2020-07-10 DIAGNOSIS — M25511 Pain in right shoulder: Secondary | ICD-10-CM | POA: Insufficient documentation

## 2020-07-10 DIAGNOSIS — Y9389 Activity, other specified: Secondary | ICD-10-CM | POA: Insufficient documentation

## 2020-07-10 DIAGNOSIS — M546 Pain in thoracic spine: Secondary | ICD-10-CM | POA: Diagnosis not present

## 2020-07-10 DIAGNOSIS — S32009A Unspecified fracture of unspecified lumbar vertebra, initial encounter for closed fracture: Secondary | ICD-10-CM | POA: Insufficient documentation

## 2020-07-10 DIAGNOSIS — Z856 Personal history of leukemia: Secondary | ICD-10-CM | POA: Insufficient documentation

## 2020-07-10 DIAGNOSIS — Z041 Encounter for examination and observation following transport accident: Secondary | ICD-10-CM | POA: Diagnosis not present

## 2020-07-10 DIAGNOSIS — M545 Low back pain, unspecified: Secondary | ICD-10-CM

## 2020-07-10 DIAGNOSIS — Z794 Long term (current) use of insulin: Secondary | ICD-10-CM | POA: Insufficient documentation

## 2020-07-10 DIAGNOSIS — E119 Type 2 diabetes mellitus without complications: Secondary | ICD-10-CM | POA: Insufficient documentation

## 2020-07-10 DIAGNOSIS — I10 Essential (primary) hypertension: Secondary | ICD-10-CM | POA: Diagnosis not present

## 2020-07-10 DIAGNOSIS — S3992XA Unspecified injury of lower back, initial encounter: Secondary | ICD-10-CM | POA: Diagnosis present

## 2020-07-10 DIAGNOSIS — S32030A Wedge compression fracture of third lumbar vertebra, initial encounter for closed fracture: Secondary | ICD-10-CM | POA: Diagnosis not present

## 2020-07-10 MED ORDER — OXYCODONE HCL 5 MG PO TABS
5.0000 mg | ORAL_TABLET | Freq: Four times a day (QID) | ORAL | 0 refills | Status: DC | PRN
Start: 2020-07-10 — End: 2020-07-13

## 2020-07-10 MED ORDER — IBUPROFEN 400 MG PO TABS
400.0000 mg | ORAL_TABLET | Freq: Once | ORAL | Status: AC
  Administered 2020-07-10: 400 mg via ORAL
  Filled 2020-07-10: qty 1

## 2020-07-10 NOTE — ED Notes (Signed)
Snack provided with okay from provider.
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
## 2020-07-10 NOTE — ED Notes (Signed)
Requesting to leave.  Explained the physician ordered another test.  Transporter arrived to take her for the scan.  States I'll stay since she is here.

## 2020-07-10 NOTE — Discharge Instructions (Addendum)
As we discussed, your x-ray was concerning for a possible fracture in your lower back.  We obtained a CT scan which does a better job but looking at this.  On the CT scan, there was no obvious fracture seen.  It looked more like height loss from degenerative changes (wear and tear from living).  This is most likely contributing to the pain.  As we discussed, we will plan to send you home with some pain medication.  Follow-up with neurosurgeon for continued pain.  Return emergency department worsening pain, difficulty walking, numbness/weakness of your arms or legs.
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
## 2020-07-13 ENCOUNTER — Encounter: Payer: Self-pay | Admitting: Family Medicine

## 2020-07-13 ENCOUNTER — Ambulatory Visit (INDEPENDENT_AMBULATORY_CARE_PROVIDER_SITE_OTHER): Payer: Medicare Other | Admitting: Family Medicine

## 2020-07-13 DIAGNOSIS — M545 Low back pain, unspecified: Secondary | ICD-10-CM | POA: Diagnosis not present

## 2020-07-13 DIAGNOSIS — M549 Dorsalgia, unspecified: Secondary | ICD-10-CM | POA: Insufficient documentation

## 2020-07-13 MED ORDER — TRAMADOL HCL 50 MG PO TABS: 50.0000 mg | ORAL_TABLET | Freq: Four times a day (QID) | ORAL | 0 refills | Status: AC | PRN
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
## 2020-07-13 NOTE — Patient Instructions (Signed)
Try tramadol as needed for pain control.   Follow up with me in about 2 weeks.
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
## 2020-07-26 ENCOUNTER — Ambulatory Visit: Payer: Medicare Other | Admitting: Family Medicine

## 2020-07-27 ENCOUNTER — Ambulatory Visit (INDEPENDENT_AMBULATORY_CARE_PROVIDER_SITE_OTHER): Payer: Medicare Other | Admitting: Family Medicine

## 2020-07-27 ENCOUNTER — Encounter: Payer: Self-pay | Admitting: Family Medicine

## 2020-07-27 VITALS — BP 159/84 | HR 55 | Wt 217.0 lb

## 2020-07-27 DIAGNOSIS — Z794 Long term (current) use of insulin: Secondary | ICD-10-CM

## 2020-07-27 DIAGNOSIS — I10 Essential (primary) hypertension: Secondary | ICD-10-CM

## 2020-07-27 DIAGNOSIS — E119 Type 2 diabetes mellitus without complications: Secondary | ICD-10-CM | POA: Diagnosis not present

## 2020-07-27 DIAGNOSIS — M545 Low back pain, unspecified: Secondary | ICD-10-CM | POA: Diagnosis not present

## 2020-07-27 DIAGNOSIS — Z780 Asymptomatic menopausal state: Secondary | ICD-10-CM

## 2020-07-27 NOTE — Patient Instructions (Signed)
You can try some heat to the back area as needed.  Schedule colon cancer screening.  I have ordered bone density test.  See me again in 3 months.

## 2020-07-29 ENCOUNTER — Other Ambulatory Visit (HOSPITAL_BASED_OUTPATIENT_CLINIC_OR_DEPARTMENT_OTHER): Payer: Medicare Other

## 2020-07-31 MED ORDER — LEVEMIR FLEXTOUCH 100 UNIT/ML ~~LOC~~ SOPN: 25.0000 [IU] | PEN_INJECTOR | Freq: Every day | SUBCUTANEOUS | 1 refills | Status: DC

## 2020-07-31 NOTE — Assessment & Plan Note (Signed)
A1c improved since last visit.  Continue current medications and work on reduction of carbohydrates.

## 2020-07-31 NOTE — Assessment & Plan Note (Signed)
BP elevated today.  Has not taken medication for today yet.  Recommend that she take as soon as she gets home.  Recommend a low sodium diet.
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
## 2020-07-31 NOTE — Assessment & Plan Note (Signed)
Improved ast this point.  She can use heat as needed for stiffness.  Follow up for new or worsening symptoms.

## 2020-08-01 ENCOUNTER — Other Ambulatory Visit: Payer: Self-pay | Admitting: Family Medicine

## 2020-08-01 LAB — POCT GLYCOSYLATED HEMOGLOBIN (HGB A1C): Hemoglobin A1C: 8.2 % — AB (ref 4.0–5.6)

## 2020-08-03 ENCOUNTER — Telehealth: Payer: Self-pay | Admitting: *Deleted

## 2020-08-03 NOTE — Telephone Encounter (Signed)
Pt left vm this morning wanting to know if you could order her a lift chair due to her back pain.  She said that she feels that would help her greatly.

## 2020-08-04 ENCOUNTER — Other Ambulatory Visit: Payer: Self-pay | Admitting: Family Medicine

## 2020-08-04 DIAGNOSIS — M545 Low back pain, unspecified: Secondary | ICD-10-CM

## 2020-08-04 NOTE — Telephone Encounter (Signed)
Recommend adding PT rather than lift chair.

## 2020-08-04 NOTE — Telephone Encounter (Signed)
Spoke to Ms. Ellarose. She's fine with starting PT.

## 2020-08-09 ENCOUNTER — Encounter: Payer: Self-pay | Admitting: Hematology & Oncology

## 2020-08-09 ENCOUNTER — Telehealth: Payer: Self-pay | Admitting: Hematology & Oncology

## 2020-08-09 ENCOUNTER — Inpatient Hospital Stay (HOSPITAL_BASED_OUTPATIENT_CLINIC_OR_DEPARTMENT_OTHER): Payer: Medicare Other | Admitting: Hematology & Oncology

## 2020-08-09 ENCOUNTER — Inpatient Hospital Stay: Payer: Medicare Other | Attending: Hematology & Oncology

## 2020-08-09 ENCOUNTER — Other Ambulatory Visit: Payer: Self-pay

## 2020-08-09 ENCOUNTER — Inpatient Hospital Stay: Payer: Medicare Other

## 2020-08-09 VITALS — BP 149/76 | HR 54 | Temp 98.0°F | Resp 18 | Wt 221.5 lb

## 2020-08-09 DIAGNOSIS — Z9221 Personal history of antineoplastic chemotherapy: Secondary | ICD-10-CM | POA: Diagnosis not present

## 2020-08-09 DIAGNOSIS — D500 Iron deficiency anemia secondary to blood loss (chronic): Secondary | ICD-10-CM

## 2020-08-09 DIAGNOSIS — D508 Other iron deficiency anemias: Secondary | ICD-10-CM

## 2020-08-09 DIAGNOSIS — C9110 Chronic lymphocytic leukemia of B-cell type not having achieved remission: Secondary | ICD-10-CM | POA: Diagnosis not present

## 2020-08-09 LAB — CMP (CANCER CENTER ONLY)
ALT: 9 U/L (ref 0–44)
AST: 11 U/L — ABNORMAL LOW (ref 15–41)
Albumin: 3.8 g/dL (ref 3.5–5.0)
Alkaline Phosphatase: 76 U/L (ref 38–126)
Anion gap: 6 (ref 5–15)
BUN: 18 mg/dL (ref 8–23)
CO2: 29 mmol/L (ref 22–32)
Calcium: 11.3 mg/dL — ABNORMAL HIGH (ref 8.9–10.3)
Chloride: 102 mmol/L (ref 98–111)
Creatinine: 1.33 mg/dL — ABNORMAL HIGH (ref 0.44–1.00)
GFR, Estimated: 42 mL/min — ABNORMAL LOW (ref >60)
Glucose, Bld: 272 mg/dL — ABNORMAL HIGH (ref 70–99)
Potassium: 4.1 mmol/L (ref 3.5–5.1)
Sodium: 137 mmol/L (ref 135–145)
Total Bilirubin: 0.4 mg/dL (ref 0.3–1.2)
Total Protein: 5.6 g/dL — ABNORMAL LOW (ref 6.5–8.1)

## 2020-08-09 LAB — CBC WITH DIFFERENTIAL (CANCER CENTER ONLY)
Abs Immature Granulocytes: 0.02 10*3/uL (ref 0.00–0.07)
Basophils Absolute: 0 10*3/uL (ref 0.0–0.1)
Basophils Relative: 0 %
Eosinophils Absolute: 0.1 10*3/uL (ref 0.0–0.5)
Eosinophils Relative: 2 %
HCT: 35.9 % — ABNORMAL LOW (ref 36.0–46.0)
Hemoglobin: 11.5 g/dL — ABNORMAL LOW (ref 12.0–15.0)
Immature Granulocytes: 0 %
Lymphocytes Relative: 47 %
Lymphs Abs: 3 10*3/uL (ref 0.7–4.0)
MCH: 24 pg — ABNORMAL LOW (ref 26.0–34.0)
MCHC: 32 g/dL (ref 30.0–36.0)
MCV: 74.8 fL — ABNORMAL LOW (ref 80.0–100.0)
Monocytes Absolute: 0.7 10*3/uL (ref 0.1–1.0)
Monocytes Relative: 12 %
Neutro Abs: 2.4 10*3/uL (ref 1.7–7.7)
Neutrophils Relative %: 39 %
Platelet Count: 170 10*3/uL (ref 150–400)
RBC: 4.8 MIL/uL (ref 3.87–5.11)
RDW: 13.7 % (ref 11.5–15.5)
WBC Count: 6.2 10*3/uL (ref 4.0–10.5)
nRBC: 0 % (ref 0.0–0.2)

## 2020-08-09 LAB — IRON AND TIBC
Iron: 77 ug/dL (ref 41–142)
Saturation Ratios: 27 % (ref 21–57)
TIBC: 283 ug/dL (ref 236–444)
UIBC: 205 ug/dL (ref 120–384)

## 2020-08-09 LAB — LACTATE DEHYDROGENASE: LDH: 101 U/L (ref 98–192)

## 2020-08-09 LAB — FERRITIN: Ferritin: 192 ng/mL (ref 11–307)

## 2020-08-09 MED ORDER — HEPARIN SOD (PORK) LOCK FLUSH 100 UNIT/ML IV SOLN
500.0000 [IU] | Freq: Once | INTRAVENOUS | Status: AC | PRN
  Administered 2020-08-09: 500 [IU]
  Filled 2020-08-09: qty 5

## 2020-08-09 MED ORDER — SODIUM CHLORIDE 0.9% FLUSH
10.0000 mL | Freq: Once | INTRAVENOUS | Status: AC | PRN
  Administered 2020-08-09: 10 mL
  Filled 2020-08-09: qty 10

## 2020-08-09 NOTE — Telephone Encounter (Signed)
Appointments scheduled calendar printed per 11/8 los

## 2020-08-09 NOTE — Patient Instructions (Signed)
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
## 2020-08-10 ENCOUNTER — Ambulatory Visit: Payer: Medicare Other | Attending: Family Medicine | Admitting: Physical Therapy

## 2020-08-10 ENCOUNTER — Encounter: Payer: Self-pay | Admitting: Physical Therapy

## 2020-08-10 DIAGNOSIS — R293 Abnormal posture: Secondary | ICD-10-CM

## 2020-08-10 DIAGNOSIS — R2689 Other abnormalities of gait and mobility: Secondary | ICD-10-CM

## 2020-08-10 DIAGNOSIS — M545 Low back pain, unspecified: Secondary | ICD-10-CM

## 2020-08-10 DIAGNOSIS — R262 Difficulty in walking, not elsewhere classified: Secondary | ICD-10-CM | POA: Diagnosis not present

## 2020-08-10 DIAGNOSIS — M6281 Muscle weakness (generalized): Secondary | ICD-10-CM | POA: Diagnosis not present

## 2020-08-10 LAB — KAPPA/LAMBDA LIGHT CHAINS
Kappa free light chain: 13.1 mg/L (ref 3.3–19.4)
Kappa, lambda light chain ratio: 3.28 — ABNORMAL HIGH (ref 0.26–1.65)
Lambda free light chains: 4 mg/L — ABNORMAL LOW (ref 5.7–26.3)

## 2020-08-10 LAB — IGG, IGA, IGM
IgA: 23 mg/dL — ABNORMAL LOW (ref 64–422)
IgG (Immunoglobin G), Serum: 438 mg/dL — ABNORMAL LOW (ref 586–1602)
IgM (Immunoglobulin M), Srm: <5 mg/dL — ABNORMAL LOW (ref 26–217)

## 2020-08-10 LAB — PROTEIN ELECTROPHORESIS, SERUM, WITH REFLEX
A/G Ratio: 1.5 (ref 0.7–1.7)
Albumin ELP: 3.2 g/dL (ref 2.9–4.4)
Alpha-1-Globulin: 0.2 g/dL (ref 0.0–0.4)
Alpha-2-Globulin: 0.8 g/dL (ref 0.4–1.0)
Beta Globulin: 0.8 g/dL (ref 0.7–1.3)
Gamma Globulin: 0.4 g/dL (ref 0.4–1.8)
Globulin, Total: 2.2 g/dL (ref 2.2–3.9)
Total Protein ELP: 5.4 g/dL — ABNORMAL LOW (ref 6.0–8.5)
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
## 2020-08-17 ENCOUNTER — Ambulatory Visit: Payer: Medicare Other | Admitting: Physical Therapy

## 2020-08-22 ENCOUNTER — Ambulatory Visit: Payer: No Typology Code available for payment source | Admitting: Physical Therapy

## 2020-08-23 ENCOUNTER — Other Ambulatory Visit: Payer: Self-pay

## 2020-08-23 ENCOUNTER — Ambulatory Visit: Payer: Medicare Other

## 2020-08-23 DIAGNOSIS — M545 Low back pain, unspecified: Secondary | ICD-10-CM | POA: Diagnosis not present

## 2020-08-23 DIAGNOSIS — M6281 Muscle weakness (generalized): Secondary | ICD-10-CM

## 2020-08-23 DIAGNOSIS — R293 Abnormal posture: Secondary | ICD-10-CM | POA: Diagnosis not present

## 2020-08-23 DIAGNOSIS — R262 Difficulty in walking, not elsewhere classified: Secondary | ICD-10-CM

## 2020-08-23 DIAGNOSIS — R2689 Other abnormalities of gait and mobility: Secondary | ICD-10-CM

## 2020-08-23 NOTE — Therapy (Signed)
[facility] [address]  [place], [place], [address] Phone: [phone]   Fax:  [phone]  Physical Therapy Treatment  Patient Details  Name: Megan Tate MRN: [id number] Date of Birth: 09-Apr-1946 Referring Provider (PT): Luetta Nutting, MD   Encounter Date: 08/23/2020   PT End of Session - 08/23/20 0857    Visit Number 2    Number of Visits 13    Date for PT Re-Evaluation 09/21/20    Authorization Type Medicare & Medicaid    PT Start Time 0845    PT Stop Time 0930    PT Time Calculation (min) 45 min    Equipment Utilized During Treatment Gait belt    Activity Tolerance Patient tolerated treatment well    Behavior During Therapy [facility] for tasks assessed/performed           Past Medical History:  Diagnosis Date  . Cancer ([place]) CLL  . CLL (chronic lymphocytic leukemia) ([place]) 08/16/2014  . Diabetes mellitus without complication ([place])   . Hypertension     History reviewed. No pertinent surgical history.  There were no vitals filed for this visit.   Subjective Assessment - 08/23/20 0855    Subjective Pt. noting some issues with HEP activity laying on her back.    Pertinent History HTN, SM, chronic lymphotytic leukemia 2015, mastectomy 2019    Diagnostic tests 07/10/20 lumbar CT: at most minimal height loss anteriorly at the L3 and L2. Could reflect remote compression or degenerative height loss. No clear acute fracture, Mild dextrocurvature of the lumbar spine    Patient Stated Goals decrease pain    Currently in Pain? No/denies    Pain Score 0-No pain   LBP up to 6/10 when bending   Pain Location Back    Pain Orientation Left    Pain Descriptors / Indicators --   "Pinching"   Pain Type Acute pain    Pain Onset More than a month ago    Pain Frequency Intermittent    Aggravating Factors  bending    Pain Relieving Factors resting    Multiple Pain Sites No                              [facility] Adult PT Treatment/Exercise - 08/23/20 0001      Ambulation/Gait   Ambulation/Gait Yes    Ambulation/Gait Assistance 5: Supervision    Ambulation/Gait Assistance Details cues for increased B step-length; cues for proper use of SPC    Ambulation Distance (Feet) 50 Feet    Assistive device Straight cane      Lumbar Exercises: Aerobic   Nustep Lvl 2, 6 min (UE/LE)      Lumbar Exercises: Seated   Sit to Stand 10 reps    Sit to Stand Limitations 2 sets; from chair + airex pad       Lumbar Exercises: Supine   Clam 10 reps;3 seconds    Clam Limitations seated red looped TB at knees       Knee/Hip Exercises: Seated   Long Arc Quad Right;Left;10 reps;Strengthening    Long Arc Quad Limitations cues for increased pace     Clamshell with TheraBand Red   x 10   Other Seated Knee/Hip Exercises Seated hip adduction pillow squeeze 5" x 10 reps                     PT Short  Term Goals - 08/23/20 0902      PT SHORT TERM GOAL #1   Title Patient to be independent with initial HEP.    Time 2    Period Weeks    Status On-going    Target Date 08/24/20             PT Long Term Goals - 08/23/20 0902      PT LONG TERM GOAL #1   Title Patient to be independent with advanced HEP.    Time 6    Period Weeks    Status On-going      PT LONG TERM GOAL #2   Title Patient to demonstrate B LE strength >/=4+/5.    Time 6    Period Weeks    Status On-going      PT LONG TERM GOAL #3   Title Patient to demonstrate lumbar AROM with mild ROM limitation and without pain limiting.    Time 6    Period Weeks    Status On-going      PT LONG TERM GOAL #4   Title Patient to report 80% improvement in pain levels.    Time 6    Period Weeks    Status On-going      PT LONG TERM GOAL #5   Title Patient to demonstrate symmetrical step length, upright trunk, and step-through pattern with ambulation with LRAD.    Time 6    Period Weeks    Status  On-going                 Plan - 08/23/20 0917    Clinical Impression Statement Megan Tate seen ambulating into session with slow gait pattern with SPC.  Gait training focused on improving step-length with SPC positioning closer to BOS.  Progressed LE strengthening with cueing to increased pace of exercises for carryover into improved LE clearance walking with cane for improved safety.  All activities tolerated well today without increased pain.    Comorbidities HTN, SM, chronic lymphotytic leukemia 2015, mastectomy 2019    Rehab Potential Good    PT Frequency 2x / week    PT Duration 6 weeks    PT Treatment/Interventions ADLs/Self Care Home Management;Cryotherapy;Electrical Stimulation;Moist Heat;Balance training;Therapeutic exercise;Therapeutic activities;Functional mobility training;Stair training;Gait training;DME Instruction;Ultrasound;Neuromuscular re-education;Patient/family education;Manual techniques;Vasopneumatic Device;Taping;Energy conservation;Dry needling;Passive range of motion    PT Next Visit Plan LE strengthening and lumbar mobility work    Oncologist with Plan of Care Patient           Patient will benefit from skilled therapeutic intervention in order to improve the following deficits and impairments:  Abnormal gait, Hypomobility, Decreased activity tolerance, Decreased strength, Increased fascial restricitons, Pain, Decreased balance, Decreased mobility, Difficulty walking, Increased muscle spasms, Improper body mechanics, Decreased range of motion, Impaired flexibility, Postural dysfunction  Visit Diagnosis: Acute bilateral low back pain without sciatica  Muscle weakness (generalized)  Difficulty in walking, not elsewhere classified  Other abnormalities of gait and mobility  Abnormal posture     Problem List Patient Active Problem List   Diagnosis Date Noted  . Back pain 07/13/2020  . Folliculitis 23/76/2831  . Other fatigue 10/20/2019  .  Dysuria 06/03/2019  . Post-menopausal 05/18/2019  . Gait instability 07/15/2018  . Seasonal allergic rhinitis 07/15/2018  . IDA (iron deficiency anemia) 05/13/2018  . Benign essential hypertension 02/04/2016  . 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 Type 2 diabetes mellitus without complication, with long-term current use of insulin ([place]) 02/04/2016  . Gout  02/04/2016  . Hypercalcemia 02/04/2016  . Obesity, morbid ([place]) 02/04/2016  . Swollen lymph nodes 02/04/2016  . Vaginal yeast infection 09/20/2014  . CLL (chronic lymphocytic leukemia) ([place]) 08/16/2014    Bess Harvest, PTA 08/23/20 12:19 PM   [facility] [address]  [place], [place], [address] Phone: [phone]   Fax:  [phone]  Name: Megan Tate MRN: [id number] Date of Birth: 08/17/1946

## 2020-08-30 ENCOUNTER — Other Ambulatory Visit: Payer: Self-pay

## 2020-08-30 ENCOUNTER — Ambulatory Visit: Payer: Medicare Other | Admitting: Physical Therapy

## 2020-08-30 ENCOUNTER — Encounter: Payer: Self-pay | Admitting: Physical Therapy

## 2020-08-30 DIAGNOSIS — R262 Difficulty in walking, not elsewhere classified: Secondary | ICD-10-CM | POA: Diagnosis not present

## 2020-08-30 DIAGNOSIS — M545 Low back pain, unspecified: Secondary | ICD-10-CM

## 2020-08-30 DIAGNOSIS — R2689 Other abnormalities of gait and mobility: Secondary | ICD-10-CM

## 2020-08-30 DIAGNOSIS — M6281 Muscle weakness (generalized): Secondary | ICD-10-CM | POA: Diagnosis not present

## 2020-08-30 DIAGNOSIS — R293 Abnormal posture: Secondary | ICD-10-CM | POA: Diagnosis not present

## 2020-08-30 NOTE — Therapy (Signed)
[facility] [address]  [place], [place], [address] Phone: [phone]   Fax:  [phone]  Physical Therapy Treatment  Patient Details  Name: Megan Tate MRN: [id number] Date of Birth: 25-Feb-1946 Referring Provider (PT): Luetta Nutting, MD   Encounter Date: 08/30/2020   PT End of Session - 08/30/20 0927    Visit Number 3    Number of Visits 13    Date for PT Re-Evaluation 09/21/20    Authorization Type Medicare & Medicaid    PT Start Time 0837    PT Stop Time 0926    PT Time Calculation (min) 49 min    Activity Tolerance Patient tolerated treatment well    Behavior During Therapy [facility] for tasks assessed/performed           Past Medical History:  Diagnosis Date  . Cancer ([place]) CLL  . CLL (chronic lymphocytic leukemia) ([place]) 08/16/2014  . Diabetes mellitus without complication ([place])   . Hypertension     History reviewed. No pertinent surgical history.  There were no vitals filed for this visit.   Subjective Assessment - 08/30/20 0839    Subjective Has been feeling better. Notes that she does not like her cane but it does make her feel more stable on her feet. Still having some trouble bending over as if to pick something off the floor.    Pertinent History HTN, SM, chronic lymphotytic leukemia 2015, mastectomy 2019    How long can you stand comfortably? 5 minutes    Diagnostic tests 07/10/20 lumbar CT: at most minimal height loss anteriorly at the L3 and L2. Could reflect remote compression or degenerative height loss. No clear acute fracture, Mild dextrocurvature of the lumbar spine    Patient Stated Goals decrease pain    Currently in Pain? No/denies                             [facility] Adult PT Treatment/Exercise - 08/30/20 0001      Exercises   Exercises Lumbar      Lumbar Exercises: Stretches   Passive Hamstring Stretch Right;Left;1 rep;30 seconds    Passive Hamstring Stretch  Limitations supine with strap    Figure 4 Stretch 1 rep;30 seconds;With overpressure    Figure 4 Stretch Limitations supine     Other Lumbar Stretch Exercise forward prayer stretch 10x3" to tolerance; R/L diagonals 3x3"   cues to hold at end range     Lumbar Exercises: Aerobic   Nustep Lvl 3, 6 min (UE/LE)      Lumbar Exercises: Seated   Sit to Stand 5 reps    Sit to Stand Limitations 2x5; 1st set using 1 UE support; 2nd set with red TB   cues for proper set up; last rep without UE support   Other Seated Lumbar Exercises sitting clam with green TB 10x      Lumbar Exercises: Supine   Bridge 10 reps    Bridge Limitations fairly good ROM and tolerance    Other Supine Lumbar Exercises LTR to each side 10x   good tolerance     Lumbar Exercises: Sidelying   Clam Right;Left;10 reps    Clam Limitations manual and verbal cues to avoid posterior roll    Other Sidelying Lumbar Exercises R/L open book stretch 10x    cues to avoid compensating with shoulder  PT Education - 08/30/20 0927    Education Details update to HEP    Person(s) Educated Patient    Methods Explanation;Demonstration;Tactile cues;Verbal cues;Handout    Comprehension Verbalized understanding;Returned demonstration            PT Short Term Goals - 08/30/20 8588      PT SHORT TERM GOAL #1   Title Patient to be independent with initial HEP.    Time 2    Period Weeks    Status Achieved    Target Date 08/24/20             PT Long Term Goals - 08/23/20 0902      PT LONG TERM GOAL #1   Title Patient to be independent with advanced HEP.    Time 6    Period Weeks    Status On-going      PT LONG TERM GOAL #2   Title Patient to demonstrate B LE strength >/=4+/5.    Time 6    Period Weeks    Status On-going      PT LONG TERM GOAL #3   Title Patient to demonstrate lumbar AROM with mild ROM limitation and without pain limiting.    Time 6    Period Weeks    Status On-going      PT  LONG TERM GOAL #4   Title Patient to report 80% improvement in pain levels.    Time 6    Period Weeks    Status On-going      PT LONG TERM GOAL #5   Title Patient to demonstrate symmetrical step length, upright trunk, and step-through pattern with ambulation with LRAD.    Time 6    Period Weeks    Status On-going                 Plan - 08/30/20 5027    Clinical Impression Statement Patient reporting improvement in pain levels since starting therapy. However, still noting limited standing tolerance as well as pain with forward flexion. Worked on gentle lumbopelvic ROM to address patient's concerns, with patient demonstrating limited ROM. Adjusted SPC height for improved stability with gait, which patient noted good benefit from. Practiced STS transfers with 1 UE support with patient requiring cues for proper set up to improve ease of transfer, but with excellent carryover and able to perform last rep without UE support. Increased hip strengthening ther-ex with patient demonstrating fairly good ROM and tolerance. Able to perform spinal rotation with improved ROM with LTR, more limited with initiation of open book stretch today. Updated HEP with exercises that were well-tolerated today. Patient reported understanding and without complaints at end of session. Patient is progressing well towards goals.    Comorbidities HTN, SM, chronic lymphotytic leukemia 2015, mastectomy 2019    Rehab Potential Good    PT Frequency 2x / week    PT Duration 6 weeks    PT Treatment/Interventions ADLs/Self Care Home Management;Cryotherapy;Electrical Stimulation;Moist Heat;Balance training;Therapeutic exercise;Therapeutic activities;Functional mobility training;Stair training;Gait training;DME Instruction;Ultrasound;Neuromuscular re-education;Patient/family education;Manual techniques;Vasopneumatic Device;Taping;Energy conservation;Dry needling;Passive range of motion    PT Next Visit Plan LE strengthening and  lumbar mobility work    Oncologist with Plan of Care Patient           Patient will benefit from skilled therapeutic intervention in order to improve the following deficits and impairments:  Abnormal gait, Hypomobility, Decreased activity tolerance, Decreased strength, Increased fascial restricitons, Pain, Decreased balance, Decreased mobility, Difficulty walking, Increased muscle spasms,  Improper body mechanics, Decreased range of motion, Impaired flexibility, Postural dysfunction  Visit Diagnosis: Acute bilateral low back pain without sciatica  Muscle weakness (generalized)  Difficulty in walking, not elsewhere classified  Other abnormalities of gait and mobility  Abnormal posture     Problem List Patient Active Problem List   Diagnosis Date Noted  . Back pain 07/13/2020  . Folliculitis 63/89/3734  . Other fatigue 10/20/2019  . Dysuria 06/03/2019  . Post-menopausal 05/18/2019  . Gait instability 07/15/2018  . Seasonal allergic rhinitis 07/15/2018  . IDA (iron deficiency anemia) 05/13/2018  . Benign essential hypertension 02/04/2016  . 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 Type 2 diabetes mellitus without complication, with long-term current use of insulin ([place]) 02/04/2016  . Gout 02/04/2016  . Hypercalcemia 02/04/2016  . Obesity, morbid ([place]) 02/04/2016  . Swollen lymph nodes 02/04/2016  . Vaginal yeast infection 09/20/2014  . CLL (chronic lymphocytic leukemia) ([place]) 08/16/2014     Janene Harvey, PT, DPT 08/30/20 9:29 AM   [facility] [address]  [place], [place], [address] Phone: [phone]   Fax:  [phone]  Name: Megan Tate MRN: [id number] Date of Birth: [birth year]

## 2020-09-02 ENCOUNTER — Encounter: Payer: Medicare Other | Admitting: Physical Therapy

## 2020-09-06 ENCOUNTER — Ambulatory Visit: Payer: Medicare Other | Attending: Family Medicine

## 2020-09-06 ENCOUNTER — Other Ambulatory Visit: Payer: Self-pay

## 2020-09-06 DIAGNOSIS — M545 Low back pain, unspecified: Secondary | ICD-10-CM | POA: Diagnosis not present

## 2020-09-06 DIAGNOSIS — R293 Abnormal posture: Secondary | ICD-10-CM | POA: Diagnosis not present

## 2020-09-06 DIAGNOSIS — M6281 Muscle weakness (generalized): Secondary | ICD-10-CM | POA: Diagnosis not present

## 2020-09-06 DIAGNOSIS — R262 Difficulty in walking, not elsewhere classified: Secondary | ICD-10-CM | POA: Insufficient documentation

## 2020-09-06 DIAGNOSIS — R2689 Other abnormalities of gait and mobility: Secondary | ICD-10-CM | POA: Insufficient documentation

## 2020-09-06 NOTE — Therapy (Signed)
[facility] [address]  [place], [place], [address] Phone: [phone]   Fax:  [phone]  Physical Therapy Treatment  Patient Details  Name: Megan Tate MRN: [id number] Date of Birth: 1946-05-16 Referring Provider (PT): Luetta Nutting, MD   Encounter Date: 09/06/2020   PT End of Session - 09/06/20 0852    Visit Number 4    Number of Visits 13    Date for PT Re-Evaluation 09/21/20    Authorization Type Medicare & Medicaid    PT Start Time 0845    PT Stop Time 0923    PT Time Calculation (min) 38 min    Activity Tolerance Patient tolerated treatment well    Behavior During Therapy [facility] for tasks assessed/performed           Past Medical History:  Diagnosis Date  . Cancer ([place]) CLL  . CLL (chronic lymphocytic leukemia) ([place]) 08/16/2014  . Diabetes mellitus without complication ([place])   . Hypertension     History reviewed. No pertinent surgical history.  There were no vitals filed for this visit.   Subjective Assessment - 09/06/20 0850    Subjective Pt. doing ok.    Pertinent History HTN, SM, chronic lymphotytic leukemia 2015, mastectomy 2019    Diagnostic tests 07/10/20 lumbar CT: at most minimal height loss anteriorly at the L3 and L2. Could reflect remote compression or degenerative height loss. No clear acute fracture, Mild dextrocurvature of the lumbar spine    Patient Stated Goals decrease pain    Currently in Pain? No/denies    Pain Score 0-No pain   pain up to a 7/10 after 5 min standing   Pain Location Back    Pain Orientation Medial;Lower;Right;Left    Pain Descriptors / Indicators Sharp;Constant    Pain Type Acute pain    Pain Onset More than a month ago    Pain Frequency Intermittent    Aggravating Factors  standing >5 min    Multiple Pain Sites No                             [facility] Adult PT Treatment/Exercise - 09/06/20 0001      Lumbar Exercises: Stretches    Passive Hamstring Stretch Right;Left;1 rep;30 seconds    Passive Hamstring Stretch Limitations seated hip hinge with heel up on stool       Lumbar Exercises: Aerobic   Nustep Lvl 4, 6 min (UE/LE)      Lumbar Exercises: Seated   Sit to Stand 10 reps    Sit to Stand Limitations without hands       Lumbar Exercises: Supine   Bridge --   x 12 reps      Knee/Hip Exercises: Stretches   Press photographer Right;Left;1 rep;20 seconds    Gastroc Stretch Limitations runners stretch leaning into counter       Knee/Hip Exercises: Standing   Heel Raises Both;15 reps    Heel Raises Limitations heel and toe raise       Knee/Hip Exercises: Seated   Long Arc Quad Right;Left;10 reps;Strengthening    Long Arc Quad Weight 2 lbs.    Long Arc Quad Limitations 2 sets     Clamshell with TheraBand Green   x 10 reps    Hamstring Curl Right;10 reps    Hamstring Limitations green TB; 2 sets  PT Short Term Goals - 08/30/20 0814      PT SHORT TERM GOAL #1   Title Patient to be independent with initial HEP.    Time 2    Period Weeks    Status Achieved    Target Date 08/24/20             PT Long Term Goals - 08/23/20 0902      PT LONG TERM GOAL #1   Title Patient to be independent with advanced HEP.    Time 6    Period Weeks    Status On-going      PT LONG TERM GOAL #2   Title Patient to demonstrate B LE strength >/=4+/5.    Time 6    Period Weeks    Status On-going      PT LONG TERM GOAL #3   Title Patient to demonstrate lumbar AROM with mild ROM limitation and without pain limiting.    Time 6    Period Weeks    Status On-going      PT LONG TERM GOAL #4   Title Patient to report 80% improvement in pain levels.    Time 6    Period Weeks    Status On-going      PT LONG TERM GOAL #5   Title Patient to demonstrate symmetrical step length, upright trunk, and step-through pattern with ambulation with LRAD.    Time 6    Period Weeks    Status On-going                   Plan - 09/06/20 0918    Clinical Impression Statement Megan Tate doing ok.  Noting decreased lower back pain while bending over now.  Does still having 7/10 lower back pain after >35min standing.  Progressed R quad/HS strengthening along with lumbopelvic ROM and strengthening activities without pain increase.  Megan Tate reporting partial compliance with HEP thus encouraged her for full adherence to HEP for full benefit from therapy.    Comorbidities HTN, SM, chronic lymphotytic leukemia 2015, mastectomy 2019    Rehab Potential Good    PT Frequency 2x / week    PT Duration 6 weeks    PT Treatment/Interventions ADLs/Self Care Home Management;Cryotherapy;Electrical Stimulation;Moist Heat;Balance training;Therapeutic exercise;Therapeutic activities;Functional mobility training;Stair training;Gait training;DME Instruction;Ultrasound;Neuromuscular re-education;Patient/family education;Manual techniques;Vasopneumatic Device;Taping;Energy conservation;Dry needling;Passive range of motion    PT Next Visit Plan LE strengthening and lumbar mobility work    Oncologist with Plan of Care Patient           Patient will benefit from skilled therapeutic intervention in order to improve the following deficits and impairments:  Abnormal gait, Hypomobility, Decreased activity tolerance, Decreased strength, Increased fascial restricitons, Pain, Decreased balance, Decreased mobility, Difficulty walking, Increased muscle spasms, Improper body mechanics, Decreased range of motion, Impaired flexibility, Postural dysfunction  Visit Diagnosis: Acute bilateral low back pain without sciatica  Muscle weakness (generalized)  Difficulty in walking, not elsewhere classified  Other abnormalities of gait and mobility  Abnormal posture     Problem List Patient Active Problem List   Diagnosis Date Noted  . Back pain 07/13/2020  . Folliculitis 48/18/5631  . Other fatigue 10/20/2019  .  Dysuria 06/03/2019  . Post-menopausal 05/18/2019  . Gait instability 07/15/2018  . Seasonal allergic rhinitis 07/15/2018  . IDA (iron deficiency anemia) 05/13/2018  . Benign essential hypertension 02/04/2016  . 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 Type 2 diabetes mellitus without complication, with long-term current use of  insulin ([place]) 02/04/2016  . Gout 02/04/2016  . Hypercalcemia 02/04/2016  . Obesity, morbid ([place]) 02/04/2016  . Swollen lymph nodes 02/04/2016  . Vaginal yeast infection 09/20/2014  . CLL (chronic lymphocytic leukemia) ([place]) 08/16/2014    Bess Harvest, PTA 09/06/20 11:25 AM   [facility] [address]  [place], [place], [address] Phone: [phone]   Fax:  [phone]  Name: Megan Tate MRN: [id number] Date of Birth: 1946/04/21

## 2020-09-07 ENCOUNTER — Other Ambulatory Visit: Payer: Self-pay

## 2020-09-07 DIAGNOSIS — E119 Type 2 diabetes mellitus without complications: Secondary | ICD-10-CM

## 2020-09-07 DIAGNOSIS — Z794 Long term (current) use of insulin: Secondary | ICD-10-CM

## 2020-09-07 MED ORDER — NOVOFINE PLUS PEN NEEDLE 32G X 4 MM MISC: 2 refills | Status: DC

## 2020-09-13 ENCOUNTER — Encounter: Payer: Self-pay | Admitting: Physical Therapy

## 2020-09-13 ENCOUNTER — Other Ambulatory Visit: Payer: Self-pay

## 2020-09-13 ENCOUNTER — Ambulatory Visit: Payer: Medicare Other | Admitting: Physical Therapy

## 2020-09-13 DIAGNOSIS — R2689 Other abnormalities of gait and mobility: Secondary | ICD-10-CM

## 2020-09-13 DIAGNOSIS — R262 Difficulty in walking, not elsewhere classified: Secondary | ICD-10-CM | POA: Diagnosis not present

## 2020-09-13 DIAGNOSIS — M6281 Muscle weakness (generalized): Secondary | ICD-10-CM | POA: Diagnosis not present

## 2020-09-13 DIAGNOSIS — R293 Abnormal posture: Secondary | ICD-10-CM

## 2020-09-13 DIAGNOSIS — M545 Low back pain, unspecified: Secondary | ICD-10-CM

## 2020-09-13 NOTE — Therapy (Signed)
[facility] [address]  [place], [place], [address] Phone: [phone]   Fax:  [phone]  Physical Therapy Treatment  Patient Details  Name: Megan Tate MRN: [id number] Date of Birth: 11/28/1945 Referring Provider (PT): Luetta Nutting, MD   Encounter Date: 09/13/2020   PT End of Session - 09/13/20 0926    Visit Number 5    Number of Visits 13    Date for PT Re-Evaluation 09/21/20    Authorization Type Medicare & Medicaid    PT Start Time 0846    PT Stop Time 0926    PT Time Calculation (min) 40 min    Activity Tolerance Patient tolerated treatment well    Behavior During Therapy [facility] for tasks assessed/performed           Past Medical History:  Diagnosis Date   Cancer ([place]) CLL   CLL (chronic lymphocytic leukemia) ([place]) 08/16/2014   Diabetes mellitus without complication ([place])    Hypertension     History reviewed. No pertinent surgical history.  There were no vitals filed for this visit.   Subjective Assessment - 09/13/20 0849    Subjective Has been feeling better- no pain this AM.    Pertinent History HTN, SM, chronic lymphotytic leukemia 2015, mastectomy 2019    Diagnostic tests 07/10/20 lumbar CT: at most minimal height loss anteriorly at the L3 and L2. Could reflect remote compression or degenerative height loss. No clear acute fracture, Mild dextrocurvature of the lumbar spine    Patient Stated Goals decrease pain    Currently in Pain? No/denies              [facility] PT Assessment - 09/13/20 0001      Observation/Other Assessments   Focus on Therapeutic Outcomes (FOTO)  lumbar: 49 (51% limited, 38% predicted)                         [facility] Adult PT Treatment/Exercise - 09/13/20 0001      Ambulation/Gait   Ambulation/Gait Assistance 7: Independent    Ambulation/Gait Assistance Details demonstrating decreased DF on L foot and slight lateral trunk lean, but improved gait  speed and continuity of gait as well as safety    Ambulation Distance (Feet) 90 Feet    Assistive device None      Lumbar Exercises: Stretches   Lower Trunk Rotation Limitations x20   cues to decrease speed     Lumbar Exercises: Aerobic   Nustep Lvl 4, 6 min (UE/LE)      Lumbar Exercises: Supine   Bridge with [employer] 10 reps    Bridge with [employer] Limitations 2x10; trouble coordinating core and adductor contraction, but good tolerace      Lumbar Exercises: Sidelying   Clam Right;Left;10 reps    Clam Limitations 2nd set with red TB; manual and verbal cues to avoid posterior roll   improved form     Knee/Hip Exercises: Stretches   Press photographer Right;Left;1 rep;30 seconds    Gastroc Stretch Limitations runners stretch leaning into counter       Knee/Hip Exercises: Standing   Heel Raises Both;15 reps    Heel Raises Limitations cues to increase ROM      Knee/Hip Exercises: Seated   Other Seated Knee/Hip Exercises R/L ankle dorsiflexion with green TB x15                  PT Education - 09/13/20  0926    Education Details update to HEP    Person(s) Educated Patient    Methods Explanation;Demonstration;Tactile cues;Verbal cues;Handout    Comprehension Returned demonstration;Verbalized understanding            PT Short Term Goals - 08/30/20 1610      PT SHORT TERM GOAL #1   Title Patient to be independent with initial HEP.    Time 2    Period Weeks    Status Achieved    Target Date 08/24/20             PT Long Term Goals - 08/23/20 0902      PT LONG TERM GOAL #1   Title Patient to be independent with advanced HEP.    Time 6    Period Weeks    Status On-going      PT LONG TERM GOAL #2   Title Patient to demonstrate B LE strength >/=4+/5.    Time 6    Period Weeks    Status On-going      PT LONG TERM GOAL #3   Title Patient to demonstrate lumbar AROM with mild ROM limitation and without pain limiting.    Time 6    Period Weeks     Status On-going      PT LONG TERM GOAL #4   Title Patient to report 80% improvement in pain levels.    Time 6    Period Weeks    Status On-going      PT LONG TERM GOAL #5   Title Patient to demonstrate symmetrical step length, upright trunk, and step-through pattern with ambulation with LRAD.    Time 6    Period Weeks    Status On-going                 Plan - 09/13/20 9604    Clinical Impression Statement Patient arrived to session with report of no pain and feeling better recently. Worked on progressing challenge with core strengthening this session. Patient with slight initial challenge coordinating muscle contraction with new exercises, but ultimately with improved form and good tolerance by last couple reps. Patient tolerated addition of banded resistance with clamshells today as she demonstrating improved form- still requiring occasional cues to avoid posterior trunk rotation. Assessed gait pattern without SPC, with patient demonstrating decreased DF on L foot and slight lateral trunk lean, but improved gait speed, continuity of gait, and safety. Encouraged patient to continue ambulating with SPC as needed outside and on compliant surfaces, otherwise okay without SPC inside. Worked on ankle stretching and strengthening to address gait deviations. Patient reported understanding of HEP update and without complaints at end of session. Patient progressing well towards goals.    Comorbidities HTN, SM, chronic lymphotytic leukemia 2015, mastectomy 2019    Rehab Potential Good    PT Frequency 2x / week    PT Duration 6 weeks    PT Treatment/Interventions ADLs/Self Care Home Management;Cryotherapy;Electrical Stimulation;Moist Heat;Balance training;Therapeutic exercise;Therapeutic activities;Functional mobility training;Stair training;Gait training;DME Instruction;Ultrasound;Neuromuscular re-education;Patient/family education;Manual techniques;Vasopneumatic Device;Taping;Energy  conservation;Dry needling;Passive range of motion    PT Next Visit Plan LE strengthening and lumbar mobility work    Oncologist with Plan of Care Patient           Patient will benefit from skilled therapeutic intervention in order to improve the following deficits and impairments:  Abnormal gait,Hypomobility,Decreased activity tolerance,Decreased strength,Increased fascial restricitons,Pain,Decreased balance,Decreased mobility,Difficulty walking,Increased muscle spasms,Improper body mechanics,Decreased range of motion,Impaired flexibility,Postural dysfunction  Visit  Diagnosis: Acute bilateral low back pain without sciatica  Muscle weakness (generalized)  Difficulty in walking, not elsewhere classified  Other abnormalities of gait and mobility  Abnormal posture     Problem List Patient Active Problem List   Diagnosis Date Noted   Back pain 88/89/1694   Folliculitis 50/38/8828   Other fatigue 10/20/2019   Dysuria 06/03/2019   Post-menopausal 05/18/2019   Gait instability 07/15/2018   Seasonal allergic rhinitis 07/15/2018   IDA (iron deficiency anemia) 05/13/2018   Benign essential hypertension 02/04/2016   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

## 2020-09-20 ENCOUNTER — Inpatient Hospital Stay: Payer: Medicare Other

## 2020-09-20 ENCOUNTER — Other Ambulatory Visit: Payer: Self-pay

## 2020-09-20 ENCOUNTER — Inpatient Hospital Stay (HOSPITAL_BASED_OUTPATIENT_CLINIC_OR_DEPARTMENT_OTHER): Payer: Medicare Other | Admitting: Hematology & Oncology

## 2020-09-20 ENCOUNTER — Encounter: Payer: Self-pay | Admitting: Hematology & Oncology

## 2020-09-20 ENCOUNTER — Telehealth: Payer: Self-pay | Admitting: Hematology & Oncology

## 2020-09-20 ENCOUNTER — Inpatient Hospital Stay: Payer: Medicare Other | Attending: Hematology & Oncology

## 2020-09-20 VITALS — BP 180/85 | HR 59 | Temp 98.1°F | Resp 18 | Wt 223.5 lb

## 2020-09-20 DIAGNOSIS — C9110 Chronic lymphocytic leukemia of B-cell type not having achieved remission: Secondary | ICD-10-CM

## 2020-09-20 DIAGNOSIS — Z79899 Other long term (current) drug therapy: Secondary | ICD-10-CM | POA: Diagnosis not present

## 2020-09-20 DIAGNOSIS — D649 Anemia, unspecified: Secondary | ICD-10-CM | POA: Insufficient documentation

## 2020-09-20 DIAGNOSIS — Z95828 Presence of other vascular implants and grafts: Secondary | ICD-10-CM

## 2020-09-20 LAB — CMP (CANCER CENTER ONLY)
ALT: 9 U/L (ref 0–44)
AST: 10 U/L — ABNORMAL LOW (ref 15–41)
Albumin: 3.9 g/dL (ref 3.5–5.0)
Alkaline Phosphatase: 81 U/L (ref 38–126)
Anion gap: 6 (ref 5–15)
BUN: 16 mg/dL (ref 8–23)
CO2: 29 mmol/L (ref 22–32)
Calcium: 11.3 mg/dL — ABNORMAL HIGH (ref 8.9–10.3)
Chloride: 103 mmol/L (ref 98–111)
Creatinine: 1.25 mg/dL — ABNORMAL HIGH (ref 0.44–1.00)
GFR, Estimated: 46 mL/min — ABNORMAL LOW (ref >60)
Glucose, Bld: 206 mg/dL — ABNORMAL HIGH (ref 70–99)
Potassium: 4 mmol/L (ref 3.5–5.1)
Sodium: 138 mmol/L (ref 135–145)
Total Bilirubin: 0.5 mg/dL (ref 0.3–1.2)
Total Protein: 5.8 g/dL — ABNORMAL LOW (ref 6.5–8.1)

## 2020-09-20 LAB — CBC WITH DIFFERENTIAL (CANCER CENTER ONLY)
Abs Immature Granulocytes: 0.03 10*3/uL (ref 0.00–0.07)
Basophils Absolute: 0 10*3/uL (ref 0.0–0.1)
Basophils Relative: 1 %
Eosinophils Absolute: 0.2 10*3/uL (ref 0.0–0.5)
Eosinophils Relative: 2 %
HCT: 35 % — ABNORMAL LOW (ref 36.0–46.0)
Hemoglobin: 11.1 g/dL — ABNORMAL LOW (ref 12.0–15.0)
Immature Granulocytes: 0 %
Lymphocytes Relative: 45 %
Lymphs Abs: 3.2 10*3/uL (ref 0.7–4.0)
MCH: 23.8 pg — ABNORMAL LOW (ref 26.0–34.0)
MCHC: 31.7 g/dL (ref 30.0–36.0)
MCV: 75.1 fL — ABNORMAL LOW (ref 80.0–100.0)
Monocytes Absolute: 0.9 10*3/uL (ref 0.1–1.0)
Monocytes Relative: 12 %
Neutro Abs: 2.8 10*3/uL (ref 1.7–7.7)
Neutrophils Relative %: 40 %
Platelet Count: 203 10*3/uL (ref 150–400)
RBC: 4.66 MIL/uL (ref 3.87–5.11)
RDW: 14.4 % (ref 11.5–15.5)
WBC Count: 7 10*3/uL (ref 4.0–10.5)
nRBC: 0 % (ref 0.0–0.2)

## 2020-09-20 LAB — LACTATE DEHYDROGENASE: LDH: 96 U/L — ABNORMAL LOW (ref 98–192)

## 2020-09-20 LAB — SAVE SMEAR(SSMR), FOR PROVIDER SLIDE REVIEW

## 2020-09-20 MED ORDER — HEPARIN SOD (PORK) LOCK FLUSH 100 UNIT/ML IV SOLN
500.0000 [IU] | Freq: Once | INTRAVENOUS | Status: AC
  Administered 2020-09-20: 09:00:00 500 [IU] via INTRAVENOUS
  Filled 2020-09-20: qty 5

## 2020-09-20 MED ORDER — SODIUM CHLORIDE 0.9% FLUSH
10.0000 mL | Freq: Once | INTRAVENOUS | Status: AC
  Administered 2020-09-20: 09:00:00 10 mL via INTRAVENOUS
  Filled 2020-09-20: qty 10

## 2020-09-20 NOTE — Telephone Encounter (Signed)
Appointments scheduled calendar printed per 12/21 los 
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
## 2020-09-21 ENCOUNTER — Other Ambulatory Visit: Payer: Self-pay

## 2020-09-21 ENCOUNTER — Ambulatory Visit: Payer: Medicare Other

## 2020-09-21 ENCOUNTER — Other Ambulatory Visit: Payer: Self-pay | Admitting: Family Medicine

## 2020-09-21 DIAGNOSIS — M6281 Muscle weakness (generalized): Secondary | ICD-10-CM | POA: Diagnosis not present

## 2020-09-21 DIAGNOSIS — M545 Low back pain, unspecified: Secondary | ICD-10-CM

## 2020-09-21 DIAGNOSIS — R2689 Other abnormalities of gait and mobility: Secondary | ICD-10-CM

## 2020-09-21 DIAGNOSIS — R262 Difficulty in walking, not elsewhere classified: Secondary | ICD-10-CM

## 2020-09-21 DIAGNOSIS — R293 Abnormal posture: Secondary | ICD-10-CM

## 2020-09-21 LAB — IGG, IGA, IGM
IgA: 21 mg/dL — ABNORMAL LOW (ref 64–422)
IgG (Immunoglobin G), Serum: 473 mg/dL — ABNORMAL LOW (ref 586–1602)
IgM (Immunoglobulin M), Srm: <5 mg/dL — ABNORMAL LOW (ref 26–217)

## 2020-09-21 NOTE — Therapy (Signed)
[facility] [place] [address]  [address] [place], [place], [address] Phone: [phone]   Fax:  [phone]  Physical Therapy Treatment  Patient Details  Name: Megan Tate MRN: [id number] Date of Birth: 03/05/1946 Referring Provider (PT): Cody Matthews, MD   Encounter Date: 09/21/2020   PT End of Session - 09/21/20 0941    Visit Number 6    Number of Visits 12    Date for PT Re-Evaluation 11/02/20    Authorization Type Medicare & Medicaid    PT Start Time 0933    PT Stop Time 1015    PT Time Calculation (min) 42 min    Activity Tolerance Patient tolerated treatment well    Behavior During Therapy WFL for tasks assessed/performed           Past Medical History:  Diagnosis Date  . Cancer (HCC) CLL  . CLL (chronic lymphocytic leukemia) (HCC) 08/16/2014  . Diabetes mellitus without complication (HCC)   . Hypertension     No past surgical history on file.  There were no vitals filed for this visit.   Subjective Assessment - 09/21/20 0941    Subjective Pt. doing ok.    Pertinent History HTN, SM, chronic lymphotytic leukemia 2015, mastectomy 2019    Diagnostic tests 07/10/20 lumbar CT: at most minimal height loss anteriorly at the L3 and L2. Could reflect remote compression or degenerative height loss. No clear acute fracture, Mild dextrocurvature of the lumbar spine    Patient Stated Goals decrease pain    Currently in Pain? No/denies    Pain Score 0-No pain   pain rising up to 5/10 with prolonged standing   Pain Location Back    Pain Orientation Lower;Right;Left    Pain Descriptors / Indicators Aching    Pain Type Acute pain    Pain Onset More than a month ago    Pain Frequency Intermittent    Aggravating Factors  prolonged standing    Pain Relieving Factors rest    Multiple Pain Sites No              [facility] PT Assessment - 09/21/20 0001      Assessment   Medical Diagnosis Acute B LBP without sciatica     Referring Provider (PT) Cody Matthews, MD    Onset Date/Surgical Date 07/10/20      AROM   AROM Assessment Site Lumbar    Lumbar Flexion mid shin    Lumbar Extension mod limitation    Lumbar - Right Side Bend WFL    Lumbar - Left Side Bend WFL    Lumbar - Right Rotation min limited    Lumbar - Left Rotation min limited      Strength   Strength Assessment Site Hip;Knee;Ankle    Right/Left Hip Right;Left    Right Hip Flexion 4/5    Right Hip ABduction 4-/5    Right Hip ADduction 4/5    Left Hip Flexion 4-/5    Left Hip ABduction 4-/5    Left Hip ADduction 4-/5    Right/Left Knee Right;Left    Right Knee Flexion 4-/5    Right Knee Extension 4+/5    Left Knee Flexion 4/5    Left Knee Extension 4/5    Right/Left Ankle Right;Left    Right Ankle Dorsiflexion 4/5    Right Ankle Plantar Flexion 4-/5    Left Ankle Dorsiflexion 4/5    Left Ankle Plantar Flexion 4-/5                           [facility] Adult PT Treatment/Exercise - 09/21/20 0001      Lumbar Exercises: Aerobic   Nustep Lvl 4, 6 min (UE/LE)      Knee/Hip Exercises: Standing   Hip Flexion Right;Left;Stengthening;10 reps;Knee bent    Hip Flexion Limitations counter    Hip Abduction Right;Left;10 reps;Knee straight;Stengthening    Abduction Limitations counter suppor t                  PT Education - 09/21/20 1025    Education Details HEP update    Person(s) Educated Patient    Methods Explanation;Demonstration;Verbal cues;Handout    Comprehension Verbalized understanding;Returned demonstration;Verbal cues required            PT Short Term Goals - 08/30/20 0928      PT SHORT TERM GOAL #1   Title Patient to be independent with initial HEP.    Time 2    Period Weeks    Status Achieved    Target Date 08/24/20             PT Long Term Goals - 09/21/20 0948      PT LONG TERM GOAL #1   Title Patient to be independent with advanced HEP.    Time 6    Period Weeks    Status Partially  Met    Target Date 11/22/20      PT LONG TERM GOAL #2   Title Patient to demonstrate B LE strength >/=4+/5.    Time 6    Period Weeks    Status Partially Met   09/21/20: met for R quad strength 4+/5, grossly remains 4-/5-4/5 strength with majority of LE strength with MMT   Target Date 11/22/20      PT LONG TERM GOAL #3   Title Patient to demonstrate lumbar AROM with mild ROM limitation and without pain limiting.    Time 6    Period Weeks    Status Partially Met   09/21/20: improved in rotation, side bending B WFL, most limited in flexion, extension AROM   Target Date 11/22/20      PT LONG TERM GOAL #4   Title Patient to report 80% improvement in pain levels.    Time 6    Period Weeks    Status Partially Met   09/21/20:  noting 75% improvement in overall pain levels.   Target Date 11/22/20      PT LONG TERM GOAL #5   Title Patient to demonstrate symmetrical step length, upright trunk, and step-through pattern with ambulation with LRAD.    Time 6    Period Weeks    Status Partially Met   09/21/20: demonstrating improved gait mechanics   Target Date 11/22/20                 Plan - 09/21/20 1025    Clinical Impression Statement [name] is making progress toward all LTGs (now partially achieved), demonstrating improved LE strength with MMT, reporting reduced pain levels by 75% since starting therapy and able to demo improved lumbar AROM.  [name]'s gait mechanics have improved as she demonstrates improved stability, and gait speed ambulating with SPC however still with poor LE clearance/limited DF.  Strength testing with MMT revealing some remaining LE strength limitation in hip flexors, DF and other muscle groups which will likely benefit from further skilled therapy for improved safety with gait, functional strengthening for improved mobility.  Pt. made aware that today is last day of current POC and wishes to continue   with therapy as she feels she is improving with her mobility  and strength.  Supervising PT approving additional 1x/week for 6 weeks to address previously mentioned deficits.    Comorbidities HTN, SM, chronic lymphotytic leukemia 2015, mastectomy 2019    Rehab Potential Good    PT Frequency 2x / week    PT Duration 6 weeks    PT Treatment/Interventions ADLs/Self Care Home Management;Cryotherapy;Electrical Stimulation;Moist Heat;Balance training;Therapeutic exercise;Therapeutic activities;Functional mobility training;Stair training;Gait training;DME Instruction;Ultrasound;Neuromuscular re-education;Patient/family education;Manual techniques;Vasopneumatic Device;Taping;Energy conservation;Dry needling;Passive range of motion    PT Next Visit Plan LE strengthening and lumbar mobility work    Consulted and Agree with Plan of Care Patient           Patient will benefit from skilled therapeutic intervention in order to improve the following deficits and impairments:  Abnormal gait,Hypomobility,Decreased activity tolerance,Decreased strength,Increased fascial restricitons,Pain,Decreased balance,Decreased mobility,Difficulty walking,Increased muscle spasms,Improper body mechanics,Decreased range of motion,Impaired flexibility,Postural dysfunction  Visit Diagnosis: Acute bilateral low back pain without sciatica  Muscle weakness (generalized)  Difficulty in walking, not elsewhere classified  Other abnormalities of gait and mobility  Abnormal posture     Problem List Patient Active Problem List   Diagnosis Date Noted  . Back pain 07/13/2020  . Folliculitis 04/05/2020  . Other fatigue 10/20/2019  . Dysuria 06/03/2019  . Post-menopausal 05/18/2019  . Gait instability 07/15/2018  . Seasonal allergic rhinitis 07/15/2018  . IDA (iron deficiency anemia) 05/13/2018  . Benign essential hypertension 02/04/2016  . 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 Type 2 diabetes mellitus without complication, with long-term current use of insulin (HCC)  02/04/2016  . Gout 02/04/2016  . Hypercalcemia 02/04/2016  . Obesity, morbid (HCC) 02/04/2016  . Swollen lymph nodes 02/04/2016  . Vaginal yeast infection 09/20/2014  . CLL (chronic lymphocytic leukemia) (HCC) 08/16/2014   Micah Denny, PTA 09/21/20 12:46 PM     [facility] [place] [address]  [address] [place], [place], [address] Phone: [phone]   Fax:  [phone]  Name: Megan Tate MRN: [id number] Date of Birth: 05/20/1946  Patient is progressing well towards therapy goals. Still demonstrates limited lumbar flexion and extension as well as weakness in B LEs that affects her gait pattern. Patient would benefit from continued skilled PT services 1x/week for 6 weeks to address remaining goals and return to PLOF.   Yevgeniya Kovalenko, PT, DPT 09/21/20 12:50 PM    

## 2020-09-26 ENCOUNTER — Other Ambulatory Visit: Payer: Self-pay | Admitting: Family Medicine

## 2020-10-05 ENCOUNTER — Ambulatory Visit: Payer: Medicare Other | Attending: Family Medicine | Admitting: Physical Therapy

## 2020-10-05 ENCOUNTER — Other Ambulatory Visit: Payer: Self-pay

## 2020-10-05 ENCOUNTER — Encounter: Payer: Self-pay | Admitting: Physical Therapy

## 2020-10-05 DIAGNOSIS — R262 Difficulty in walking, not elsewhere classified: Secondary | ICD-10-CM | POA: Insufficient documentation

## 2020-10-05 DIAGNOSIS — R293 Abnormal posture: Secondary | ICD-10-CM | POA: Insufficient documentation

## 2020-10-05 DIAGNOSIS — M6281 Muscle weakness (generalized): Secondary | ICD-10-CM | POA: Diagnosis not present

## 2020-10-05 DIAGNOSIS — M545 Low back pain, unspecified: Secondary | ICD-10-CM | POA: Diagnosis not present

## 2020-10-05 DIAGNOSIS — R2689 Other abnormalities of gait and mobility: Secondary | ICD-10-CM | POA: Diagnosis not present

## 2020-10-05 NOTE — Therapy (Addendum)
[facility] [place] [address]  [place], [place], [address] Phone: [phone]   Fax:  [phone]  Physical Therapy Treatment  Patient Details  Name: Megan Tate MRN: [id number] Date of Birth: March 02, 1946 Referring Provider (PT): Luetta Nutting, MD   Progress Note Reporting Period 08/10/20 to 10/05/20  See note below for Objective Data and Assessment of Progress/Goals.     Encounter Date: 10/05/2020   PT End of Session - 10/05/20 0936    Visit Number 7    Number of Visits 12    Date for PT Re-Evaluation 11/02/20    Authorization Type Medicare & Medicaid    PT Start Time [phone]    PT Stop Time 0930    PT Time Calculation (min) 44 min    Activity Tolerance Patient tolerated treatment well    Behavior During Therapy [facility] for tasks assessed/performed           Past Medical History:  Diagnosis Date  . Cancer ([place]) CLL  . CLL (chronic lymphocytic leukemia) ([place]) 08/16/2014  . Diabetes mellitus without complication ([place])   . Hypertension     History reviewed. No pertinent surgical history.  There were no vitals filed for this visit.   Subjective Assessment - 10/05/20 0848    Subjective Has been doing well.    Pertinent History HTN, SM, chronic lymphotytic leukemia 2015, mastectomy 2019    Diagnostic tests 07/10/20 lumbar CT: at most minimal height loss anteriorly at the L3 and L2. Could reflect remote compression or degenerative height loss. No clear acute fracture, Mild dextrocurvature of the lumbar spine    Patient Stated Goals decrease pain    Currently in Pain? No/denies              [facility] PT Assessment - 10/05/20 0001      Observation/Other Assessments   Focus on Therapeutic Outcomes (FOTO)  lumbar: 60 (40% limited, 38% predicted)                         [facility] Adult PT Treatment/Exercise - 10/05/20 0001      Lumbar Exercises: Stretches   Gastroc Stretch Right;Left;2 reps;30 seconds    runner's stretch     Lumbar Exercises: Aerobic   Nustep Lvl 4, 6 min (UE/LE)      Lumbar Exercises: Standing   Heel Raises 15 reps    Heel Raises Limitations cues to increase ROM    Functional Squats 10 reps    Functional Squats Limitations mini squat + heel raise   excessive forward fold   Other Standing Lumbar Exercises alt toe tap on 9" step with 2# 3x30"   18 reps, 24, reps, 23 reps     Lumbar Exercises: Sidelying   Clam Right;Left;10 reps    Clam Limitations 2x10; red TB    Hip Abduction Right;Left;10 reps    Hip Abduction Limitations good form    Other Sidelying Lumbar Exercises R/L hip adduction with opposite LE elevated on bolster 10x each      Knee/Hip Exercises: Stretches   Piriformis Stretch Right;Left;1 rep;30 seconds    Piriformis Stretch Limitations supine figure 4 with self-OP      Knee/Hip Exercises: Standing   Other Standing Knee Exercises sidestepping with yellow TB around ankles 4x length of counter   cues to avoid toe out and trunk flexion  PT Short Term Goals - 08/30/20 2449      PT SHORT TERM GOAL #1   Title Patient to be independent with initial HEP.    Time 2    Period Weeks    Status Achieved    Target Date 08/24/20             PT Long Term Goals - 09/21/20 0948      PT LONG TERM GOAL #1   Title Patient to be independent with advanced HEP.    Time 6    Period Weeks    Status Partially Met    Target Date 11/22/20      PT LONG TERM GOAL #2   Title Patient to demonstrate B LE strength >/=4+/5.    Time 6    Period Weeks    Status Partially Met   09/21/20: met for R quad strength 4+/5, grossly remains 4-/5-4/5 strength with majority of LE strength with MMT   Target Date 11/22/20      PT LONG TERM GOAL #3   Title Patient to demonstrate lumbar AROM with mild ROM limitation and without pain limiting.    Time 6    Period Weeks    Status Partially Met   09/21/20: improved in rotation, side bending B WFL, most  limited in flexion, extension AROM   Target Date 11/22/20      PT LONG TERM GOAL #4   Title Patient to report 80% improvement in pain levels.    Time 6    Period Weeks    Status Partially Met   09/21/20:  noting 75% improvement in overall pain levels.   Target Date 11/22/20      PT LONG TERM GOAL #5   Title Patient to demonstrate symmetrical step length, upright trunk, and step-through pattern with ambulation with LRAD.    Time 6    Period Weeks    Status Partially Met   09/21/20: demonstrating improved gait mechanics   Target Date 11/22/20                 Plan - 10/05/20 7530    Clinical Impression Statement Patient without new complaints this AM. Worked on progressive hip and LE strengthening ther-ex today. Patient reported challenge with sidelying hip strengthening but very responsive to cues and able to subsequently demonstrate good form. Reported lightheadedness upon sitting from supine ther-ex which dissipated quickly with sitting break. Proceeded with standing LE strengthening with good effort. Patient intermittently catching toes with toe tap d/t decreased ankle DF ROM, thus this was addressed with gastroc stretching. Ended session without complaints. Patient is progressing well and demonstrating improved ther-ex tolerance.    Comorbidities HTN, SM, chronic lymphotytic leukemia 2015, mastectomy 2019    Rehab Potential Good    PT Frequency 2x / week    PT Duration 6 weeks    PT Treatment/Interventions ADLs/Self Care Home Management;Cryotherapy;Electrical Stimulation;Moist Heat;Balance training;Therapeutic exercise;Therapeutic activities;Functional mobility training;Stair training;Gait training;DME Instruction;Ultrasound;Neuromuscular re-education;Patient/family education;Manual techniques;Vasopneumatic Device;Taping;Energy conservation;Dry needling;Passive range of motion    PT Next Visit Plan LE strengthening and lumbar mobility work    Oncologist with Plan of Care  Patient           Patient will benefit from skilled therapeutic intervention in order to improve the following deficits and impairments:  Abnormal gait,Hypomobility,Decreased activity tolerance,Decreased strength,Increased fascial restricitons,Pain,Decreased balance,Decreased mobility,Difficulty walking,Increased muscle spasms,Improper body mechanics,Decreased range of motion,Impaired flexibility,Postural dysfunction  Visit Diagnosis: Acute bilateral low back pain without sciatica  Muscle  weakness (generalized)  Difficulty in walking, not elsewhere classified  Other abnormalities of gait and mobility  Abnormal posture     Problem List Patient Active Problem List   Diagnosis Date Noted  . Back pain 07/13/2020  . Folliculitis 45/84/8350  . Other fatigue 10/20/2019  . Dysuria 06/03/2019  . Post-menopausal 05/18/2019  . Gait instability 07/15/2018  . Seasonal allergic rhinitis 07/15/2018  . IDA (iron deficiency anemia) 05/13/2018  . Benign essential hypertension 02/04/2016  . 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 Type 2 diabetes mellitus without complication, with long-term current use of insulin ([place]) 02/04/2016  . Gout 02/04/2016  . Hypercalcemia 02/04/2016  . Obesity, morbid ([place]) 02/04/2016  . Swollen lymph nodes 02/04/2016  . Vaginal yeast infection 09/20/2014  . CLL (chronic lymphocytic leukemia) ([place]) 08/16/2014     Janene Harvey, PT, DPT 10/05/20 9:39 AM   [facility] [address]  [place], [place], [address] Phone: [phone]   Fax:  [phone]  Name: Juliane Guest MRN: [id number] Date of Birth: [birth year]  PHYSICAL THERAPY DISCHARGE SUMMARY  Visits from Start of Care: 7  Current functional level related to goals / functional outcomes: Unable to assess; patient cx'd remaining appointments d/t "other issues with her body"   Remaining deficits: Unable to assess    Education / Equipment: HEP  Plan: Patient agrees to discharge.  Patient goals were partially met. Patient is being discharged due to the patient's request.  ?????     Janene Harvey, PT, DPT 11/09/20 1:39 PM

## 2020-10-12 ENCOUNTER — Ambulatory Visit: Payer: Medicare Other

## 2020-10-19 ENCOUNTER — Encounter: Payer: Medicare Other | Admitting: Physical Therapy

## 2020-10-27 ENCOUNTER — Other Ambulatory Visit: Payer: Self-pay

## 2020-10-27 ENCOUNTER — Encounter: Payer: Self-pay | Admitting: Family Medicine

## 2020-10-27 ENCOUNTER — Ambulatory Visit (INDEPENDENT_AMBULATORY_CARE_PROVIDER_SITE_OTHER): Payer: Medicare Other | Admitting: Family Medicine

## 2020-10-27 VITALS — BP 168/86 | HR 57 | Wt 226.5 lb

## 2020-10-27 DIAGNOSIS — I10 Essential (primary) hypertension: Secondary | ICD-10-CM

## 2020-10-27 DIAGNOSIS — M545 Low back pain, unspecified: Secondary | ICD-10-CM

## 2020-10-27 DIAGNOSIS — Z794 Long term (current) use of insulin: Secondary | ICD-10-CM

## 2020-10-27 DIAGNOSIS — E119 Type 2 diabetes mellitus without complications: Secondary | ICD-10-CM | POA: Diagnosis not present

## 2020-10-27 DIAGNOSIS — Z1231 Encounter for screening mammogram for malignant neoplasm of breast: Secondary | ICD-10-CM

## 2020-10-27 LAB — GLUCOSE, POCT (MANUAL RESULT ENTRY): POC Glucose: 302 mg/dl — AB (ref 70–99)

## 2020-10-27 MED ORDER — AMLODIPINE BESYLATE 10 MG PO TABS: 10.0000 mg | ORAL_TABLET | Freq: Every day | ORAL | 1 refills | Status: DC

## 2020-10-27 MED ORDER — LEVEMIR FLEXTOUCH 100 UNIT/ML ~~LOC~~ SOPN: 33.0000 [IU] | PEN_INJECTOR | Freq: Every day | SUBCUTANEOUS | 1 refills | Status: DC

## 2020-10-27 NOTE — Assessment & Plan Note (Signed)
Blood pressure is not at goal at for age and co-morbidities.  I recommend increase of amlodipine to 10mg .  In addition they were instructed to follow a low sodium diet with regular exercise to help to maintain adequate control of blood pressure.

## 2020-10-27 NOTE — Assessment & Plan Note (Signed)
Blood glucose remains elevated here and at home.  I'll have her increase levemir to 33 units and continue janumet at current strength

## 2020-10-27 NOTE — Patient Instructions (Signed)
Increase levemir to 33 units daily.  Increase amlodipine to 10mg  daily.  Cut back on processed foods/meats.  See me again in 3 months.

## 2020-10-27 NOTE — Assessment & Plan Note (Signed)
Improved with PT.
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
## 2020-10-31 ENCOUNTER — Ambulatory Visit: Payer: Medicare Other | Admitting: Physical Therapy

## 2020-11-01 ENCOUNTER — Inpatient Hospital Stay (HOSPITAL_BASED_OUTPATIENT_CLINIC_OR_DEPARTMENT_OTHER): Payer: Medicare Other | Admitting: Hematology & Oncology

## 2020-11-01 ENCOUNTER — Other Ambulatory Visit: Payer: Self-pay

## 2020-11-01 ENCOUNTER — Inpatient Hospital Stay: Payer: Medicare Other | Attending: Hematology & Oncology

## 2020-11-01 ENCOUNTER — Inpatient Hospital Stay: Payer: Medicare Other

## 2020-11-01 ENCOUNTER — Encounter: Payer: Self-pay | Admitting: Hematology & Oncology

## 2020-11-01 VITALS — BP 163/70 | HR 67 | Temp 98.4°F | Resp 18 | Wt 224.0 lb

## 2020-11-01 DIAGNOSIS — Z452 Encounter for adjustment and management of vascular access device: Secondary | ICD-10-CM | POA: Insufficient documentation

## 2020-11-01 DIAGNOSIS — Z95828 Presence of other vascular implants and grafts: Secondary | ICD-10-CM

## 2020-11-01 DIAGNOSIS — R197 Diarrhea, unspecified: Secondary | ICD-10-CM | POA: Insufficient documentation

## 2020-11-01 DIAGNOSIS — C9110 Chronic lymphocytic leukemia of B-cell type not having achieved remission: Secondary | ICD-10-CM | POA: Diagnosis not present

## 2020-11-01 DIAGNOSIS — Z79899 Other long term (current) drug therapy: Secondary | ICD-10-CM | POA: Insufficient documentation

## 2020-11-01 LAB — CMP (CANCER CENTER ONLY)
ALT: 11 U/L (ref 0–44)
AST: 12 U/L — ABNORMAL LOW (ref 15–41)
Albumin: 4 g/dL (ref 3.5–5.0)
Alkaline Phosphatase: 76 U/L (ref 38–126)
Anion gap: 6 (ref 5–15)
BUN: 15 mg/dL (ref 8–23)
CO2: 30 mmol/L (ref 22–32)
Calcium: 11.3 mg/dL — ABNORMAL HIGH (ref 8.9–10.3)
Chloride: 101 mmol/L (ref 98–111)
Creatinine: 1.18 mg/dL — ABNORMAL HIGH (ref 0.44–1.00)
GFR, Estimated: 49 mL/min — ABNORMAL LOW (ref >60)
Glucose, Bld: 241 mg/dL — ABNORMAL HIGH (ref 70–99)
Potassium: 4.1 mmol/L (ref 3.5–5.1)
Sodium: 137 mmol/L (ref 135–145)
Total Bilirubin: 0.6 mg/dL (ref 0.3–1.2)
Total Protein: 5.8 g/dL — ABNORMAL LOW (ref 6.5–8.1)

## 2020-11-01 LAB — CBC WITH DIFFERENTIAL (CANCER CENTER ONLY)
Abs Immature Granulocytes: 0.04 10*3/uL (ref 0.00–0.07)
Basophils Absolute: 0 10*3/uL (ref 0.0–0.1)
Basophils Relative: 0 %
Eosinophils Absolute: 0.1 10*3/uL (ref 0.0–0.5)
Eosinophils Relative: 2 %
HCT: 35.6 % — ABNORMAL LOW (ref 36.0–46.0)
Hemoglobin: 11.4 g/dL — ABNORMAL LOW (ref 12.0–15.0)
Immature Granulocytes: 1 %
Lymphocytes Relative: 45 %
Lymphs Abs: 3.1 10*3/uL (ref 0.7–4.0)
MCH: 24.3 pg — ABNORMAL LOW (ref 26.0–34.0)
MCHC: 32 g/dL (ref 30.0–36.0)
MCV: 75.7 fL — ABNORMAL LOW (ref 80.0–100.0)
Monocytes Absolute: 0.9 10*3/uL (ref 0.1–1.0)
Monocytes Relative: 13 %
Neutro Abs: 2.6 10*3/uL (ref 1.7–7.7)
Neutrophils Relative %: 39 %
Platelet Count: 213 10*3/uL (ref 150–400)
RBC: 4.7 MIL/uL (ref 3.87–5.11)
RDW: 13.7 % (ref 11.5–15.5)
WBC Count: 6.7 10*3/uL (ref 4.0–10.5)
nRBC: 0 % (ref 0.0–0.2)

## 2020-11-01 LAB — SAVE SMEAR(SSMR), FOR PROVIDER SLIDE REVIEW

## 2020-11-01 LAB — LACTATE DEHYDROGENASE: LDH: 90 U/L — ABNORMAL LOW (ref 98–192)

## 2020-11-01 LAB — RETICULOCYTES
Immature Retic Fract: 15.6 % (ref 2.3–15.9)
RBC.: 4.68 MIL/uL (ref 3.87–5.11)
Retic Count, Absolute: 65.5 10*3/uL (ref 19.0–186.0)
Retic Ct Pct: 1.4 % (ref 0.4–3.1)

## 2020-11-01 MED ORDER — HEPARIN SOD (PORK) LOCK FLUSH 100 UNIT/ML IV SOLN
500.0000 [IU] | Freq: Once | INTRAVENOUS | Status: AC
  Administered 2020-11-01: 500 [IU] via INTRAVENOUS
  Filled 2020-11-01: qty 5

## 2020-11-01 MED ORDER — SODIUM CHLORIDE 0.9% FLUSH
10.0000 mL | Freq: Once | INTRAVENOUS | Status: AC
  Administered 2020-11-01: 10 mL via INTRAVENOUS
  Filled 2020-11-01: qty 10

## 2020-11-01 NOTE — Patient Instructions (Signed)

## 2020-11-01 NOTE — Progress Notes (Signed)
Hematology and Oncology Follow Up Visit  Megan Tate [id number] 1946-07-15 75 y.o. 11/01/2020   Principle Diagnosis:  Chronic lymphocytic leukemia- Trisomy 12  Past Therapy:             Status post 6 cycles of Gazyva/Bendamustine - completed4/2016 Maintenance Gazyva every 3 months s/p cycle 11 -completed 2 years in June 2018  Current Therapy:   Observation   Interim History:  Megan Tate is here today for follow-up.  All seems to be going well for her.  She had a nice Christmas and New Year's with her family.  She is not sure what she will do this year.  She is still worried about the coronavirus.  She is still try to get her blood sugars under better control.  Her blood sugar the day was 247.  She has had no problems with infections.  She has had no issues with nausea or vomiting.  She is having no issues with bowels or bladder.  There is been no diarrhea.  She has had no issues with swollen lymph nodes.  She has been eating well.  She tries to watch what she eats because of the diarrhea.  She has had no rashes.  There has been no leg swelling.  Overall, her performance status is ECOG 1.  What  Medications:  Allergies as of 11/01/2020      Reactions   Tylenol [acetaminophen] Other (See Comments)   Messes with stomach      Medication List       Accurate as of November 01, 2020 10:20 AM. If you have any questions, ask your nurse or doctor.        amLODipine 10 MG tablet Commonly known as: NORVASC Take 1 tablet (10 mg total) by mouth daily.   cloNIDine 0.2 MG tablet Commonly known as: CATAPRES Take 0.2 mg by mouth 2 (two) times daily.   dorzolamide-timolol 22.3-6.8 MG/ML ophthalmic solution Commonly known as: COSOPT Place 1 drop into both eyes 2 (two) times daily.   folic acid 1 MG tablet Commonly known as: FOLVITE TAKE 2 TABLETS BY MOUTH EVERY DAY   glucose blood test strip Commonly known as: ONE TOUCH ULTRA TEST USE TO TEST BLOOD SUGAR BEFORE MEALS AND AT  BEDTIME   Janumet XR 50-1000 MG Tb24 Generic drug: SitaGLIPtin-MetFORMIN HCl TAKE 2 TABLETS BY MOUTH EVERY DAY   Levemir FlexTouch 100 UNIT/ML FlexPen Generic drug: insulin detemir Inject 33 Units into the skin daily.   Linzess 145 MCG Caps capsule Generic drug: linaclotide Take 1 capsule by mouth daily.   losartan 100 MG tablet Commonly known as: COZAAR TAKE 1 TABLET BY MOUTH EVERY DAY   meclizine 25 MG tablet Commonly known as: ANTIVERT Take 1 tablet (25 mg total) by mouth 3 (three) times daily as needed for dizziness.   metoprolol succinate 50 MG 24 hr tablet Commonly known as: TOPROL-XL Take 1 tablet (50 mg total) by mouth daily. Take with or immediately following a meal.   NovoFine Plus Pen Needle 32G X 4 MM Misc Generic drug: Insulin Pen Needle PATIENT IS TO CHECK BLOOD SUGAR DAILY BEFORE MEALS AND BEDTIME. DX. E11.9   ONE TOUCH ULTRA 2 w/Device Kit Use to check blood glucose qid.  Diagnosis: P53.7   OneTouch Delica Plus [id number] Misc 1 Device by Does not apply route 4 (four) times daily. Use to check blood glucose qid.  Diagnosis: E11.9   rosuvastatin 20 MG tablet Commonly known as: CRESTOR TAKE 1 TABLET BY MOUTH EVERY  DAY   spironolactone 50 MG tablet Commonly known as: ALDACTONE TAKE 1 TABLET BY MOUTH EVERY DAY       Allergies:  Allergies  Allergen Reactions  . Tylenol [Acetaminophen] Other (See Comments)    Messes with stomach    Past Medical History, Surgical history, Social history, and Family History were reviewed and updated.  Review of Systems: Review of Systems  Constitutional: Negative.   HENT: Negative.   Eyes: Negative.   Respiratory: Negative.   Cardiovascular: Negative.   Gastrointestinal: Negative.   Genitourinary: Negative.   Musculoskeletal: Negative.   Skin: Negative.   Neurological: Negative.   Endo/Heme/Allergies: Negative.   Psychiatric/Behavioral: Negative.      Physical Exam:  weight is 224 lb (101.6 kg). Her  oral temperature is 98.4 F (36.9 C). Her blood pressure is 163/70 (abnormal) and her pulse is 67. Her respiration is 18 and oxygen saturation is 100%.   Wt Readings from Last 3 Encounters:  11/01/20 224 lb (101.6 kg)  10/27/20 226 lb 8 oz (102.7 kg)  09/20/20 223 lb 8 oz (101.4 kg)    Physical Exam Vitals reviewed.  HENT:     Head: Normocephalic and atraumatic.  Eyes:     Pupils: Pupils are equal, round, and reactive to light.  Cardiovascular:     Rate and Rhythm: Normal rate and regular rhythm.     Heart sounds: Normal heart sounds.  Pulmonary:     Effort: Pulmonary effort is normal.     Breath sounds: Normal breath sounds.  Abdominal:     General: Bowel sounds are normal.     Palpations: Abdomen is soft.  Musculoskeletal:        General: No tenderness or deformity. Normal range of motion.     Cervical back: Normal range of motion.  Lymphadenopathy:     Cervical: No cervical adenopathy.  Skin:    General: Skin is warm and dry.     Findings: No erythema or rash.  Neurological:     Mental Status: She is alert and oriented to person, place, and time.  Psychiatric:        Behavior: Behavior normal.        Thought Content: Thought content normal.        Judgment: Judgment normal.      Lab Results  Component Value Date   WBC 6.7 11/01/2020   HGB 11.4 (L) 11/01/2020   HCT 35.6 (L) 11/01/2020   MCV 75.7 (L) 11/01/2020   PLT 213 11/01/2020   Lab Results  Component Value Date   FERRITIN 192 08/09/2020   IRON 77 08/09/2020   TIBC 283 08/09/2020   UIBC 205 08/09/2020   IRONPCTSAT 27 08/09/2020   Lab Results  Component Value Date   RETICCTPCT 1.4 11/01/2020   RBC 4.68 11/01/2020   RBC 4.70 11/01/2020   RETICCTABS 65.4 04/07/2014   Lab Results  Component Value Date   KPAFRELGTCHN 13.1 08/09/2020   LAMBDASER 4.0 (L) 08/09/2020   KAPLAMBRATIO 3.28 (H) 08/09/2020   Lab Results  Component Value Date   IGGSERUM 473 (L) 09/20/2020   IGA 21 (L) 09/20/2020    IGMSERUM <5 (L) 09/20/2020   Lab Results  Component Value Date   TOTALPROTELP 5.4 (L) 08/09/2020   ALBUMINELP 3.2 08/09/2020   A1GS 0.2 08/09/2020   A2GS 0.8 08/09/2020   BETS 0.8 08/09/2020   BETA2SER 0.2 03/30/2015   GAMS 0.4 08/09/2020   MSPIKE Not Observed 08/09/2020   SPEI * 03/30/2015  Chemistry      Component Value Date/Time   NA 138 09/20/2020 0915   NA 142 09/05/2017 0751   K 4.0 09/20/2020 0915   K 3.8 09/05/2017 0751   CL 103 09/20/2020 0915   CL 104 09/05/2017 0751   CO2 29 09/20/2020 0915   CO2 27 09/05/2017 0751   BUN 16 09/20/2020 0915   BUN 11 09/05/2017 0751   CREATININE 1.25 (H) 09/20/2020 0915   CREATININE 1.1 09/05/2017 0751      Component Value Date/Time   CALCIUM 11.3 (H) 09/20/2020 0915   CALCIUM 10.8 (H) 09/05/2017 0751   ALKPHOS 81 09/20/2020 0915   ALKPHOS 88 (H) 09/05/2017 0751   AST 10 (L) 09/20/2020 0915   ALT 9 09/20/2020 0915   ALT 14 09/05/2017 0751   BILITOT 0.5 09/20/2020 0915      Impression and Plan: Ms. Woodbeck is a pleasant 75 yo African American female with chronic lymphocytic leukemia. She completed treatment with Gazyva/Bendamustine in April 2016 and maintenance Gazyva in June 2018.   I am happy that her back is doing better and that she is more manageable.  She is more active.  I still think that her blood sugars will be the ultimate cause of problems for her.  Her CLL is holding nice and stable.  I looked at her blood smear under the microscope and did not see anything that looked like CLL progression.  We still follow her every 6 weeks.  We flush her Port-A-Cath.  Hopefully, it will be a nice quiet year for her and we do not have to do any treatment for the CLL.  Volanda Napoleon, MD 2/1/202210:20 AM

## 2020-11-02 ENCOUNTER — Encounter: Payer: Medicare Other | Admitting: Physical Therapy

## 2020-11-02 LAB — FERRITIN: Ferritin: 207 ng/mL (ref 11–307)

## 2020-11-02 LAB — IRON AND TIBC
Iron: 151 ug/dL — ABNORMAL HIGH (ref 41–142)
Saturation Ratios: 52 % (ref 21–57)
TIBC: 290 ug/dL (ref 236–444)
UIBC: 139 ug/dL (ref 120–384)

## 2020-11-09 ENCOUNTER — Encounter: Payer: Medicare Other | Admitting: Physical Therapy

## 2020-11-18 DIAGNOSIS — Z83511 Family history of glaucoma: Secondary | ICD-10-CM | POA: Diagnosis not present

## 2020-11-18 DIAGNOSIS — H40053 Ocular hypertension, bilateral: Secondary | ICD-10-CM | POA: Diagnosis not present

## 2020-11-18 LAB — HM DIABETES EYE EXAM

## 2020-11-26 ENCOUNTER — Other Ambulatory Visit: Payer: Self-pay | Admitting: Family Medicine

## 2020-11-26 DIAGNOSIS — C9110 Chronic lymphocytic leukemia of B-cell type not having achieved remission: Secondary | ICD-10-CM

## 2020-11-28 ENCOUNTER — Other Ambulatory Visit: Payer: Self-pay | Admitting: Family Medicine

## 2020-11-28 DIAGNOSIS — C9110 Chronic lymphocytic leukemia of B-cell type not having achieved remission: Secondary | ICD-10-CM

## 2020-11-29 ENCOUNTER — Other Ambulatory Visit: Payer: Self-pay | Admitting: Family Medicine

## 2020-11-29 ENCOUNTER — Other Ambulatory Visit: Payer: Self-pay

## 2020-11-29 DIAGNOSIS — C9110 Chronic lymphocytic leukemia of B-cell type not having achieved remission: Secondary | ICD-10-CM

## 2020-11-29 DIAGNOSIS — D500 Iron deficiency anemia secondary to blood loss (chronic): Secondary | ICD-10-CM

## 2020-11-29 MED ORDER — LINZESS 145 MCG PO CAPS: 145.0000 ug | ORAL_CAPSULE | Freq: Every day | ORAL | 3 refills | Status: DC

## 2020-11-29 MED ORDER — VALSARTAN 160 MG PO TABS: 160.0000 mg | ORAL_TABLET | Freq: Every day | ORAL | 1 refills | Status: DC

## 2020-11-29 NOTE — Telephone Encounter (Signed)
Changed to valsartan

## 2020-11-29 NOTE — Telephone Encounter (Signed)
Pls advise.  

## 2020-11-29 NOTE — Telephone Encounter (Signed)
I think this refill request was sent to the wrong location/office because she still sees Dr. Zigmund Daniel and is scheduled to see him in April.

## 2020-12-05 ENCOUNTER — Other Ambulatory Visit: Payer: Self-pay

## 2020-12-05 DIAGNOSIS — C9110 Chronic lymphocytic leukemia of B-cell type not having achieved remission: Secondary | ICD-10-CM

## 2020-12-05 MED ORDER — LOSARTAN POTASSIUM 100 MG PO TABS: 100.0000 mg | ORAL_TABLET | Freq: Every day | ORAL | 1 refills | Status: DC

## 2020-12-19 ENCOUNTER — Inpatient Hospital Stay: Payer: Medicare Other | Attending: Hematology & Oncology

## 2020-12-19 ENCOUNTER — Other Ambulatory Visit: Payer: Self-pay

## 2020-12-19 ENCOUNTER — Inpatient Hospital Stay (HOSPITAL_BASED_OUTPATIENT_CLINIC_OR_DEPARTMENT_OTHER): Payer: Medicare Other | Admitting: Hematology & Oncology

## 2020-12-19 ENCOUNTER — Inpatient Hospital Stay: Payer: Medicare Other

## 2020-12-19 VITALS — BP 181/76 | HR 68 | Temp 98.8°F | Resp 17

## 2020-12-19 VITALS — Wt 225.0 lb

## 2020-12-19 DIAGNOSIS — D508 Other iron deficiency anemias: Secondary | ICD-10-CM

## 2020-12-19 DIAGNOSIS — C9110 Chronic lymphocytic leukemia of B-cell type not having achieved remission: Secondary | ICD-10-CM | POA: Insufficient documentation

## 2020-12-19 DIAGNOSIS — Z452 Encounter for adjustment and management of vascular access device: Secondary | ICD-10-CM | POA: Insufficient documentation

## 2020-12-19 DIAGNOSIS — Z79899 Other long term (current) drug therapy: Secondary | ICD-10-CM | POA: Insufficient documentation

## 2020-12-19 DIAGNOSIS — E119 Type 2 diabetes mellitus without complications: Secondary | ICD-10-CM | POA: Diagnosis not present

## 2020-12-19 LAB — CBC WITH DIFFERENTIAL (CANCER CENTER ONLY)
Abs Immature Granulocytes: 0.07 10*3/uL (ref 0.00–0.07)
Basophils Absolute: 0 10*3/uL (ref 0.0–0.1)
Basophils Relative: 0 %
Eosinophils Absolute: 0.1 10*3/uL (ref 0.0–0.5)
Eosinophils Relative: 2 %
HCT: 35.9 % — ABNORMAL LOW (ref 36.0–46.0)
Hemoglobin: 11.6 g/dL — ABNORMAL LOW (ref 12.0–15.0)
Immature Granulocytes: 1 %
Lymphocytes Relative: 41 %
Lymphs Abs: 2.4 10*3/uL (ref 0.7–4.0)
MCH: 24.4 pg — ABNORMAL LOW (ref 26.0–34.0)
MCHC: 32.3 g/dL (ref 30.0–36.0)
MCV: 75.4 fL — ABNORMAL LOW (ref 80.0–100.0)
Monocytes Absolute: 0.7 10*3/uL (ref 0.1–1.0)
Monocytes Relative: 12 %
Neutro Abs: 2.6 10*3/uL (ref 1.7–7.7)
Neutrophils Relative %: 44 %
Platelet Count: 167 10*3/uL (ref 150–400)
RBC: 4.76 MIL/uL (ref 3.87–5.11)
RDW: 13 % (ref 11.5–15.5)
WBC Count: 6 10*3/uL (ref 4.0–10.5)
nRBC: 0 % (ref 0.0–0.2)

## 2020-12-19 LAB — CMP (CANCER CENTER ONLY)
ALT: 9 U/L (ref 0–44)
AST: 12 U/L — ABNORMAL LOW (ref 15–41)
Albumin: 3.9 g/dL (ref 3.5–5.0)
Alkaline Phosphatase: 66 U/L (ref 38–126)
Anion gap: 7 (ref 5–15)
BUN: 19 mg/dL (ref 8–23)
CO2: 28 mmol/L (ref 22–32)
Calcium: 10.9 mg/dL — ABNORMAL HIGH (ref 8.9–10.3)
Chloride: 102 mmol/L (ref 98–111)
Creatinine: 1.28 mg/dL — ABNORMAL HIGH (ref 0.44–1.00)
GFR, Estimated: 44 mL/min — ABNORMAL LOW (ref >60)
Glucose, Bld: 217 mg/dL — ABNORMAL HIGH (ref 70–99)
Potassium: 3.8 mmol/L (ref 3.5–5.1)
Sodium: 137 mmol/L (ref 135–145)
Total Bilirubin: 0.5 mg/dL (ref 0.3–1.2)
Total Protein: 5.8 g/dL — ABNORMAL LOW (ref 6.5–8.1)

## 2020-12-19 LAB — LACTATE DEHYDROGENASE: LDH: 132 U/L (ref 98–192)

## 2020-12-19 MED ORDER — SODIUM CHLORIDE 0.9% FLUSH
10.0000 mL | INTRAVENOUS | Status: DC | PRN
  Administered 2020-12-19: 10 mL
  Filled 2020-12-19: qty 10

## 2020-12-19 MED ORDER — HEPARIN SOD (PORK) LOCK FLUSH 100 UNIT/ML IV SOLN
500.0000 [IU] | Freq: Once | INTRAVENOUS | Status: AC | PRN
  Administered 2020-12-19: 500 [IU]
  Filled 2020-12-19: qty 5

## 2020-12-19 NOTE — Progress Notes (Signed)
Hematology and Oncology Follow Up Visit  Megan Tate [id number] 01-20-1946 75 y.o. 12/19/2020   Principle Diagnosis:  Chronic lymphocytic leukemia- Trisomy 12  Past Therapy:             Status post 6 cycles of Gazyva/Bendamustine - completed4/2016 Maintenance Gazyva every 3 months s/p cycle 11 -completed 2 years in June 2018  Current Therapy:   Observation   Interim History:  Megan Tate is here today for follow-up.  -As always, she is doing nicely.  She had a nice weekend.  She is looking forward to the Easter holiday.  She has had no issues with respect to her blood sugars.  Her blood sugar today is actually pretty good for her.  She has had no problems with nausea or vomiting.  She has had no cough or shortness of breath.  There has been no change in bowel or bladder habits.  She has had no rashes.  There has been no bleeding.  There has been no leg swelling.  Her Port-A-Cath is still in place.  We flushed every 6 weeks.  Overall, performance status is ECOG 1.   Medications:  Allergies as of 12/19/2020      Reactions   Tylenol [acetaminophen] Other (See Comments)   Messes with stomach      Medication List       Accurate as of December 19, 2020 10:50 AM. If you have any questions, ask your nurse or doctor.        amLODipine 10 MG tablet Commonly known as: NORVASC Take 1 tablet (10 mg total) by mouth daily.   cloNIDine 0.2 MG tablet Commonly known as: CATAPRES Take 0.2 mg by mouth 2 (two) times daily.   dorzolamide-timolol 22.3-6.8 MG/ML ophthalmic solution Commonly known as: COSOPT Place 1 drop into both eyes 2 (two) times daily.   folic acid 1 MG tablet Commonly known as: FOLVITE TAKE 2 TABLETS BY MOUTH EVERY DAY   glucose blood test strip Commonly known as: ONE TOUCH ULTRA TEST USE TO TEST BLOOD SUGAR BEFORE MEALS AND AT BEDTIME   Janumet XR 50-1000 MG Tb24 Generic drug: SitaGLIPtin-MetFORMIN HCl TAKE 2 TABLETS BY MOUTH EVERY DAY   Levemir FlexTouch  100 UNIT/ML FlexPen Generic drug: insulin detemir Inject 33 Units into the skin daily.   Linzess 145 MCG Caps capsule Generic drug: linaclotide Take 1 capsule (145 mcg total) by mouth daily before breakfast.   losartan 100 MG tablet Commonly known as: COZAAR Take 1 tablet (100 mg total) by mouth daily.   meclizine 25 MG tablet Commonly known as: ANTIVERT Take 1 tablet (25 mg total) by mouth 3 (three) times daily as needed for dizziness.   metoprolol succinate 50 MG 24 hr tablet Commonly known as: TOPROL-XL Take 1 tablet (50 mg total) by mouth daily. Take with or immediately following a meal.   NovoFine Plus Pen Needle 32G X 4 MM Misc Generic drug: Insulin Pen Needle PATIENT IS TO CHECK BLOOD SUGAR DAILY BEFORE MEALS AND BEDTIME. DX. E11.9   ONE TOUCH ULTRA 2 w/Device Kit Use to check blood glucose qid.  Diagnosis: L87.5   OneTouch Delica Plus [id number] Misc 1 Device by Does not apply route 4 (four) times daily. Use to check blood glucose qid.  Diagnosis: E11.9   rosuvastatin 20 MG tablet Commonly known as: CRESTOR TAKE 1 TABLET BY MOUTH EVERY DAY   spironolactone 50 MG tablet Commonly known as: ALDACTONE TAKE 1 TABLET BY MOUTH EVERY DAY   valsartan 160 MG  tablet Commonly known as: Diovan Take 1 tablet (160 mg total) by mouth daily.       Allergies:  Allergies  Allergen Reactions  . Tylenol [Acetaminophen] Other (See Comments)    Messes with stomach    Past Medical History, Surgical history, Social history, and Family History were reviewed and updated.  Review of Systems: Review of Systems  Constitutional: Negative.   HENT: Negative.   Eyes: Negative.   Respiratory: Negative.   Cardiovascular: Negative.   Gastrointestinal: Negative.   Genitourinary: Negative.   Musculoskeletal: Negative.   Skin: Negative.   Neurological: Negative.   Endo/Heme/Allergies: Negative.   Psychiatric/Behavioral: Negative.      Physical Exam:  weight is 225 lb (102.1  kg).   Wt Readings from Last 3 Encounters:  12/19/20 225 lb (102.1 kg)  11/01/20 224 lb (101.6 kg)  10/27/20 226 lb 8 oz (102.7 kg)    Physical Exam Vitals reviewed.  HENT:     Head: Normocephalic and atraumatic.  Eyes:     Pupils: Pupils are equal, round, and reactive to light.  Cardiovascular:     Rate and Rhythm: Normal rate and regular rhythm.     Heart sounds: Normal heart sounds.  Pulmonary:     Effort: Pulmonary effort is normal.     Breath sounds: Normal breath sounds.  Abdominal:     General: Bowel sounds are normal.     Palpations: Abdomen is soft.  Musculoskeletal:        General: No tenderness or deformity. Normal range of motion.     Cervical back: Normal range of motion.  Lymphadenopathy:     Cervical: No cervical adenopathy.  Skin:    General: Skin is warm and dry.     Findings: No erythema or rash.  Neurological:     Mental Status: She is alert and oriented to person, place, and time.  Psychiatric:        Behavior: Behavior normal.        Thought Content: Thought content normal.        Judgment: Judgment normal.      Lab Results  Component Value Date   WBC 6.0 12/19/2020   HGB 11.6 (L) 12/19/2020   HCT 35.9 (L) 12/19/2020   MCV 75.4 (L) 12/19/2020   PLT 167 12/19/2020   Lab Results  Component Value Date   FERRITIN 207 11/01/2020   IRON 151 (H) 11/01/2020   TIBC 290 11/01/2020   UIBC 139 11/01/2020   IRONPCTSAT 52 11/01/2020   Lab Results  Component Value Date   RETICCTPCT 1.4 11/01/2020   RBC 4.76 12/19/2020   RETICCTABS 65.4 04/07/2014   Lab Results  Component Value Date   KPAFRELGTCHN 13.1 08/09/2020   LAMBDASER 4.0 (L) 08/09/2020   KAPLAMBRATIO 3.28 (H) 08/09/2020   Lab Results  Component Value Date   IGGSERUM 473 (L) 09/20/2020   IGA 21 (L) 09/20/2020   IGMSERUM <5 (L) 09/20/2020   Lab Results  Component Value Date   TOTALPROTELP 5.4 (L) 08/09/2020   ALBUMINELP 3.2 08/09/2020   A1GS 0.2 08/09/2020   A2GS 0.8  08/09/2020   BETS 0.8 08/09/2020   BETA2SER 0.2 03/30/2015   GAMS 0.4 08/09/2020   MSPIKE Not Observed 08/09/2020   SPEI * 03/30/2015     Chemistry      Component Value Date/Time   NA 137 12/19/2020 0945   NA 142 09/05/2017 0751   K 3.8 12/19/2020 0945   K 3.8 09/05/2017 0751   CL  102 12/19/2020 0945   CL 104 09/05/2017 0751   CO2 28 12/19/2020 0945   CO2 27 09/05/2017 0751   BUN 19 12/19/2020 0945   BUN 11 09/05/2017 0751   CREATININE 1.28 (H) 12/19/2020 0945   CREATININE 1.1 09/05/2017 0751      Component Value Date/Time   CALCIUM 10.9 (H) 12/19/2020 0945   CALCIUM 10.8 (H) 09/05/2017 0751   ALKPHOS 66 12/19/2020 0945   ALKPHOS 88 (H) 09/05/2017 0751   AST 12 (L) 12/19/2020 0945   ALT 9 12/19/2020 0945   ALT 14 09/05/2017 0751   BILITOT 0.5 12/19/2020 0945      Impression and Plan: Megan Tate is a pleasant 75 yo African American female with chronic lymphocytic leukemia. She completed treatment with Gazyva/Bendamustine in April 2016 and maintenance Gazyva in June 2018.   As always, everything is quite stable.  I do not see any issues with respect to her CLL that will cause her problems.  As always, I still think that the diabetes is going to be the determinant of her overall mortality.  I will plan to see her back in another 6 weeks.  She likes to have 6-week follow-ups so we can flush her Port-A-Cath at the same time.   Volanda Napoleon, MD 3/21/202210:50 AM

## 2020-12-19 NOTE — Patient Instructions (Signed)

## 2020-12-20 LAB — IGG, IGA, IGM
IgA: 19 mg/dL — ABNORMAL LOW (ref 64–422)
IgG (Immunoglobin G), Serum: 438 mg/dL — ABNORMAL LOW (ref 586–1602)
IgM (Immunoglobulin M), Srm: <5 mg/dL — ABNORMAL LOW (ref 26–217)

## 2020-12-27 ENCOUNTER — Other Ambulatory Visit: Payer: Self-pay | Admitting: Family Medicine

## 2020-12-27 ENCOUNTER — Other Ambulatory Visit: Payer: Self-pay | Admitting: Hematology & Oncology

## 2020-12-27 DIAGNOSIS — D539 Nutritional anemia, unspecified: Secondary | ICD-10-CM

## 2020-12-27 DIAGNOSIS — C9110 Chronic lymphocytic leukemia of B-cell type not having achieved remission: Secondary | ICD-10-CM

## 2021-01-25 ENCOUNTER — Ambulatory Visit: Payer: Medicare Other | Admitting: Family Medicine

## 2021-01-31 ENCOUNTER — Other Ambulatory Visit: Payer: Self-pay

## 2021-01-31 ENCOUNTER — Inpatient Hospital Stay: Payer: Medicare Other | Attending: Hematology & Oncology

## 2021-01-31 ENCOUNTER — Inpatient Hospital Stay: Payer: Medicare Other

## 2021-01-31 ENCOUNTER — Inpatient Hospital Stay (HOSPITAL_BASED_OUTPATIENT_CLINIC_OR_DEPARTMENT_OTHER): Payer: Medicare Other | Admitting: Hematology & Oncology

## 2021-01-31 ENCOUNTER — Encounter: Payer: Self-pay | Admitting: Hematology & Oncology

## 2021-01-31 ENCOUNTER — Telehealth: Payer: Self-pay

## 2021-01-31 VITALS — BP 138/73 | HR 68 | Temp 98.7°F | Resp 17 | Wt 225.0 lb

## 2021-01-31 DIAGNOSIS — E119 Type 2 diabetes mellitus without complications: Secondary | ICD-10-CM | POA: Diagnosis not present

## 2021-01-31 DIAGNOSIS — C9110 Chronic lymphocytic leukemia of B-cell type not having achieved remission: Secondary | ICD-10-CM

## 2021-01-31 DIAGNOSIS — Z95828 Presence of other vascular implants and grafts: Secondary | ICD-10-CM

## 2021-01-31 DIAGNOSIS — Z452 Encounter for adjustment and management of vascular access device: Secondary | ICD-10-CM | POA: Insufficient documentation

## 2021-01-31 LAB — CMP (CANCER CENTER ONLY)
ALT: 9 U/L (ref 0–44)
AST: 11 U/L — ABNORMAL LOW (ref 15–41)
Albumin: 3.8 g/dL (ref 3.5–5.0)
Alkaline Phosphatase: 80 U/L (ref 38–126)
Anion gap: 5 (ref 5–15)
BUN: 16 mg/dL (ref 8–23)
CO2: 29 mmol/L (ref 22–32)
Calcium: 10.9 mg/dL — ABNORMAL HIGH (ref 8.9–10.3)
Chloride: 103 mmol/L (ref 98–111)
Creatinine: 1.22 mg/dL — ABNORMAL HIGH (ref 0.44–1.00)
GFR, Estimated: 47 mL/min — ABNORMAL LOW (ref >60)
Glucose, Bld: 214 mg/dL — ABNORMAL HIGH (ref 70–99)
Potassium: 3.9 mmol/L (ref 3.5–5.1)
Sodium: 137 mmol/L (ref 135–145)
Total Bilirubin: 0.6 mg/dL (ref 0.3–1.2)
Total Protein: 5.7 g/dL — ABNORMAL LOW (ref 6.5–8.1)

## 2021-01-31 LAB — CBC WITH DIFFERENTIAL (CANCER CENTER ONLY)
Abs Immature Granulocytes: 0.02 10*3/uL (ref 0.00–0.07)
Basophils Absolute: 0 10*3/uL (ref 0.0–0.1)
Basophils Relative: 0 %
Eosinophils Absolute: 0.2 10*3/uL (ref 0.0–0.5)
Eosinophils Relative: 2 %
HCT: 34.4 % — ABNORMAL LOW (ref 36.0–46.0)
Hemoglobin: 11 g/dL — ABNORMAL LOW (ref 12.0–15.0)
Immature Granulocytes: 0 %
Lymphocytes Relative: 46 %
Lymphs Abs: 3 10*3/uL (ref 0.7–4.0)
MCH: 23.9 pg — ABNORMAL LOW (ref 26.0–34.0)
MCHC: 32 g/dL (ref 30.0–36.0)
MCV: 74.6 fL — ABNORMAL LOW (ref 80.0–100.0)
Monocytes Absolute: 0.8 10*3/uL (ref 0.1–1.0)
Monocytes Relative: 12 %
Neutro Abs: 2.6 10*3/uL (ref 1.7–7.7)
Neutrophils Relative %: 40 %
Platelet Count: 182 10*3/uL (ref 150–400)
RBC: 4.61 MIL/uL (ref 3.87–5.11)
RDW: 13.5 % (ref 11.5–15.5)
WBC Count: 6.6 10*3/uL (ref 4.0–10.5)
nRBC: 0 % (ref 0.0–0.2)

## 2021-01-31 LAB — SAVE SMEAR(SSMR), FOR PROVIDER SLIDE REVIEW

## 2021-01-31 LAB — LACTATE DEHYDROGENASE: LDH: 98 U/L (ref 98–192)

## 2021-01-31 MED ORDER — SODIUM CHLORIDE 0.9% FLUSH
10.0000 mL | Freq: Once | INTRAVENOUS | Status: AC
  Administered 2021-01-31: 10 mL via INTRAVENOUS
  Filled 2021-01-31: qty 10

## 2021-01-31 MED ORDER — HEPARIN SOD (PORK) LOCK FLUSH 100 UNIT/ML IV SOLN
500.0000 [IU] | Freq: Once | INTRAVENOUS | Status: AC
  Administered 2021-01-31: 500 [IU] via INTRAVENOUS
  Filled 2021-01-31: qty 5

## 2021-01-31 NOTE — Progress Notes (Signed)
Hematology and Oncology Follow Up Visit  Megan Tate [id number] 09-18-1946 75 y.o. 01/31/2021   Principle Diagnosis:  Chronic lymphocytic leukemia- Trisomy 12  Past Therapy:             Status post 6 cycles of Gazyva/Bendamustine - completed4/2016 Maintenance Gazyva every 3 months s/p cycle 11 -completed 2 years in June 2018  Current Therapy:   Observation   Interim History:  Megan Tate is here today for follow-up.  She is doing quite nicely.  She did have a very nice Easter.  She also had a surprise this past weekend when a son of hers who lives in [place] came up to visit for an early Mother's Day celebration.  This was a very nice surprise for her.  She seems to be managing.  She has had no problems with cough or shortness of breath.  She does not like doing fingersticks for her blood sugars.  She is wonder if she might be able to get a CGM.  Her husband has 1.  I think that this would make a good idea for her.  It might make her more compliant with checking her blood sugars and her blood sugars may not be as high.  Last time that we saw her, her IgG level was a little bit on the low side.  It was 438 mg/dL.  We will have to watch this.  So far, she has had no problems with recurrent infections.  There has been no problems with bowels or bladder.  She has had no nausea or vomiting.  There has been no bleeding.  She has had no leg swelling.  Overall, her performance status is ECOG 1.  Medications:  Allergies as of 01/31/2021      Reactions   Tylenol [acetaminophen] Other (See Comments)   Messes with stomach      Medication List       Accurate as of Jan 31, 2021  8:56 AM. If you have any questions, ask your nurse or doctor.        STOP taking these medications   amLODipine 10 MG tablet Commonly known as: NORVASC Stopped by: Volanda Napoleon, MD   NovoFine Plus Pen Needle 32G X 4 MM Misc Generic drug: Insulin Pen Needle Stopped by: Volanda Napoleon, MD   ONE TOUCH  ULTRA 2 w/Device Kit Stopped by: Volanda Napoleon, MD   OneTouch Delica Plus [id number] Misc Stopped by: Volanda Napoleon, MD   valsartan 160 MG tablet Commonly known as: Diovan Stopped by: Volanda Napoleon, MD     TAKE these medications   cloNIDine 0.2 MG tablet Commonly known as: CATAPRES TAKE 1 TABLET BY MOUTH TWICE A DAY   dorzolamide-timolol 22.3-6.8 MG/ML ophthalmic solution Commonly known as: COSOPT Place 1 drop into both eyes 2 (two) times daily.   folic acid 1 MG tablet Commonly known as: FOLVITE TAKE 2 TABLETS BY MOUTH EVERY DAY   glucose blood test strip Commonly known as: ONE TOUCH ULTRA TEST USE TO TEST BLOOD SUGAR BEFORE MEALS AND AT BEDTIME   Janumet XR 50-1000 MG Tb24 Generic drug: SitaGLIPtin-MetFORMIN HCl TAKE 2 TABLETS BY MOUTH EVERY DAY   Levemir FlexTouch 100 UNIT/ML FlexPen Generic drug: insulin detemir Inject 33 Units into the skin daily.   Linzess 145 MCG Caps capsule Generic drug: linaclotide Take 1 capsule (145 mcg total) by mouth daily before breakfast.   losartan 100 MG tablet Commonly known as: COZAAR Take 1 tablet (100 mg  total) by mouth daily.   meclizine 25 MG tablet Commonly known as: ANTIVERT Take 1 tablet (25 mg total) by mouth 3 (three) times daily as needed for dizziness.   metoprolol succinate 50 MG 24 hr tablet Commonly known as: TOPROL-XL TAKE 1 TABLET (50 MG TOTAL) BY MOUTH DAILY. TAKE WITH OR IMMEDIATELY FOLLOWING A MEAL.   rosuvastatin 20 MG tablet Commonly known as: CRESTOR TAKE 1 TABLET BY MOUTH EVERY DAY   spironolactone 50 MG tablet Commonly known as: ALDACTONE TAKE 1 TABLET BY MOUTH EVERY DAY       Allergies:  Allergies  Allergen Reactions  . Tylenol [Acetaminophen] Other (See Comments)    Messes with stomach    Past Medical History, Surgical history, Social history, and Family History were reviewed and updated.  Review of Systems: Review of Systems  Constitutional: Negative.   HENT: Negative.    Eyes: Negative.   Respiratory: Negative.   Cardiovascular: Negative.   Gastrointestinal: Negative.   Genitourinary: Negative.   Musculoskeletal: Negative.   Skin: Negative.   Neurological: Negative.   Endo/Heme/Allergies: Negative.   Psychiatric/Behavioral: Negative.      Physical Exam:  weight is 225 lb (102.1 kg). Her oral temperature is 98.7 F (37.1 C). Her blood pressure is 138/73 and her pulse is 68. Her respiration is 17 and oxygen saturation is 100%.   Wt Readings from Last 3 Encounters:  01/31/21 225 lb (102.1 kg)  12/19/20 225 lb (102.1 kg)  11/01/20 224 lb (101.6 kg)    Physical Exam Vitals reviewed.  HENT:     Head: Normocephalic and atraumatic.  Eyes:     Pupils: Pupils are equal, round, and reactive to light.  Cardiovascular:     Rate and Rhythm: Normal rate and regular rhythm.     Heart sounds: Normal heart sounds.  Pulmonary:     Effort: Pulmonary effort is normal.     Breath sounds: Normal breath sounds.  Abdominal:     General: Bowel sounds are normal.     Palpations: Abdomen is soft.  Musculoskeletal:        General: No tenderness or deformity. Normal range of motion.     Cervical back: Normal range of motion.  Lymphadenopathy:     Cervical: No cervical adenopathy.  Skin:    General: Skin is warm and dry.     Findings: No erythema or rash.  Neurological:     Mental Status: She is alert and oriented to person, place, and time.  Psychiatric:        Behavior: Behavior normal.        Thought Content: Thought content normal.        Judgment: Judgment normal.      Lab Results  Component Value Date   WBC 6.6 01/31/2021   HGB 11.0 (L) 01/31/2021   HCT 34.4 (L) 01/31/2021   MCV 74.6 (L) 01/31/2021   PLT 182 01/31/2021   Lab Results  Component Value Date   FERRITIN 207 11/01/2020   IRON 151 (H) 11/01/2020   TIBC 290 11/01/2020   UIBC 139 11/01/2020   IRONPCTSAT 52 11/01/2020   Lab Results  Component Value Date   RETICCTPCT 1.4  11/01/2020   RBC 4.61 01/31/2021   RETICCTABS 65.4 04/07/2014   Lab Results  Component Value Date   KPAFRELGTCHN 13.1 08/09/2020   LAMBDASER 4.0 (L) 08/09/2020   KAPLAMBRATIO 3.28 (H) 08/09/2020   Lab Results  Component Value Date   IGGSERUM 438 (L) 12/19/2020   IGA  19 (L) 12/19/2020   IGMSERUM <5 (L) 12/19/2020   Lab Results  Component Value Date   TOTALPROTELP 5.4 (L) 08/09/2020   ALBUMINELP 3.2 08/09/2020   A1GS 0.2 08/09/2020   A2GS 0.8 08/09/2020   BETS 0.8 08/09/2020   BETA2SER 0.2 03/30/2015   GAMS 0.4 08/09/2020   MSPIKE Not Observed 08/09/2020   SPEI * 03/30/2015     Chemistry      Component Value Date/Time   NA 137 01/31/2021 0815   NA 142 09/05/2017 0751   K 3.9 01/31/2021 0815   K 3.8 09/05/2017 0751   CL 103 01/31/2021 0815   CL 104 09/05/2017 0751   CO2 29 01/31/2021 0815   CO2 27 09/05/2017 0751   BUN 16 01/31/2021 0815   BUN 11 09/05/2017 0751   CREATININE 1.22 (H) 01/31/2021 0815   CREATININE 1.1 09/05/2017 0751      Component Value Date/Time   CALCIUM 10.9 (H) 01/31/2021 0815   CALCIUM 10.8 (H) 09/05/2017 0751   ALKPHOS 80 01/31/2021 0815   ALKPHOS 88 (H) 09/05/2017 0751   AST 11 (L) 01/31/2021 0815   ALT 9 01/31/2021 0815   ALT 14 09/05/2017 0751   BILITOT 0.6 01/31/2021 0815      Impression and Plan: Megan Tate is a pleasant 75 yo African American female with chronic lymphocytic leukemia. She completed treatment with Gazyva/Bendamustine in April 2016 and maintenance Gazyva in June 2018.   As always, everything is quite stable.  I do not see any issues with respect to her CLL that will cause her problems.  I will get her blood smear.  Everything looks pretty stable.  Her lymphocytes appear mature.  As always, I still think that the diabetes is going to be the determinant of her overall mortality.  Hopefully, she will be able to get a continuous glucose monitor so that she can be more diligent with checking her blood sugars.  I will plan  to see her back in another 6 weeks.  She likes to have 6-week follow-ups so we can flush her Port-A-Cath at the same time.   Volanda Napoleon, MD 5/3/20228:56 AM

## 2021-01-31 NOTE — Patient Instructions (Signed)

## 2021-01-31 NOTE — Telephone Encounter (Signed)
01/31/21 los noted but per updated verbal los from Dr Marin Olp pt needs port flush only in six weeks and then lab flush and visit in 3 months, appts made and calendar printed for pt  Megan Tate

## 2021-02-01 LAB — IGG, IGA, IGM
IgA: 18 mg/dL — ABNORMAL LOW (ref 64–422)
IgG (Immunoglobin G), Serum: 442 mg/dL — ABNORMAL LOW (ref 586–1602)
IgM (Immunoglobulin M), Srm: <5 mg/dL — ABNORMAL LOW (ref 26–217)

## 2021-02-06 ENCOUNTER — Other Ambulatory Visit: Payer: Self-pay | Admitting: Family Medicine

## 2021-02-06 ENCOUNTER — Other Ambulatory Visit: Payer: Self-pay | Admitting: Hematology & Oncology

## 2021-02-06 DIAGNOSIS — Z1231 Encounter for screening mammogram for malignant neoplasm of breast: Secondary | ICD-10-CM

## 2021-02-06 DIAGNOSIS — D500 Iron deficiency anemia secondary to blood loss (chronic): Secondary | ICD-10-CM

## 2021-02-06 DIAGNOSIS — M25551 Pain in right hip: Secondary | ICD-10-CM

## 2021-02-06 DIAGNOSIS — C9110 Chronic lymphocytic leukemia of B-cell type not having achieved remission: Secondary | ICD-10-CM

## 2021-02-15 ENCOUNTER — Ambulatory Visit (INDEPENDENT_AMBULATORY_CARE_PROVIDER_SITE_OTHER): Payer: Medicare Other | Admitting: Family Medicine

## 2021-02-15 ENCOUNTER — Encounter: Payer: Self-pay | Admitting: Family Medicine

## 2021-02-15 ENCOUNTER — Other Ambulatory Visit: Payer: Self-pay

## 2021-02-15 ENCOUNTER — Telehealth: Payer: Self-pay

## 2021-02-15 VITALS — BP 166/78 | HR 48 | Temp 97.8°F | Ht 64.0 in | Wt 223.0 lb

## 2021-02-15 DIAGNOSIS — C9110 Chronic lymphocytic leukemia of B-cell type not having achieved remission: Secondary | ICD-10-CM | POA: Diagnosis not present

## 2021-02-15 DIAGNOSIS — I10 Essential (primary) hypertension: Secondary | ICD-10-CM | POA: Diagnosis not present

## 2021-02-15 DIAGNOSIS — E785 Hyperlipidemia, unspecified: Secondary | ICD-10-CM

## 2021-02-15 DIAGNOSIS — Z794 Long term (current) use of insulin: Secondary | ICD-10-CM | POA: Diagnosis not present

## 2021-02-15 DIAGNOSIS — E119 Type 2 diabetes mellitus without complications: Secondary | ICD-10-CM | POA: Diagnosis not present

## 2021-02-15 MED ORDER — DEXCOM G6 SENSOR MISC: 1 refills | Status: AC

## 2021-02-15 MED ORDER — DEXCOM G6 TRANSMITTER MISC: 4 refills | Status: AC

## 2021-02-15 MED ORDER — DEXCOM G6 RECEIVER DEVI: 0 refills | Status: AC

## 2021-02-15 NOTE — Patient Instructions (Addendum)
Great to see you today! Have labs completed.  We'll see if we can get Dexcom to help with monitoring blood sugars. Follow a low salt diet and be sure to take all medications for blood pressure each day.   See me again in 3 months.

## 2021-02-15 NOTE — Telephone Encounter (Signed)
[facility] called and states before the Dexcom can be filled we would have to send in information, such as chart notes and diagnosis. They will be faxing this later today.

## 2021-02-19 NOTE — Assessment & Plan Note (Addendum)
Dexcom ordered.   Check A1c today. Update lipid panel as well.

## 2021-02-19 NOTE — Assessment & Plan Note (Signed)
Stable , followed by hematology

## 2021-02-19 NOTE — Progress Notes (Signed)
Megan Tate - 75 y.o. female MRN [id number]  Date of birth: 10/01/1946  Subjective Chief Complaint  Patient presents with  . Hypertension  . Diabetes    HPI Megan Tate is a 75 y.o. female here today for follow up visit.   She reports that she is doing well.  She would like to get Dexcom so that she doesn't have to do fingerstick glucose.  She reports blood sugars at home have looked ok at home.  She is currently taking janumet and levemir..  She denies symptoms of hypoglycemia.  HTN is treated with multiple medications. She thinks that she is taking all prescribed medications but isn't sure.  She denies side effects or symptoms related to HTN including chest pain, shortness of breath, palpitations, headache or vision changes.  She is not monitoring BP at home but does have a cuff.   ROS:  A comprehensive ROS was completed and negative except as noted per HPI  Allergies  Allergen Reactions  . Tylenol [Acetaminophen] Other (See Comments)    Messes with stomach    Past Medical History:  Diagnosis Date  . Cancer ([place]) CLL  . CLL (chronic lymphocytic leukemia) ([place]) 08/16/2014  . Diabetes mellitus without complication ([place])   . Hypertension     History reviewed. No pertinent surgical history.  Social History   Socioeconomic History  . Marital status: Single    Spouse name: Not on file  . Number of children: Not on file  . Years of education: Not on file  . Highest education level: Not on file  Occupational History  . Not on file  Tobacco Use  . Smoking status: Never Smoker  . Smokeless tobacco: Never Used  . Tobacco comment: never used tobacco  Vaping Use  . Vaping Use: Never used  Substance and Sexual Activity  . Alcohol use: Never    Alcohol/week: 0.0 standard drinks  . Drug use: Never  . Sexual activity: Not on file  Other Topics Concern  . Not on file  Social History Narrative  . Not on file   Social Determinants of Health   Financial Resource Strain:  Not on file  Food Insecurity: Not on file  Transportation Needs: Not on file  Physical Activity: Not on file  Stress: Not on file  Social Connections: Not on file    History reviewed. No pertinent family history.  Health Maintenance  Topic Date Due  . COVID-19 Vaccine (1) Never done  . COLONOSCOPY (Pts 45-43yrs Insurance coverage will need to be confirmed)  Never done  . DEXA SCAN  Never done  . OPHTHALMOLOGY EXAM  04/13/2020  . MAMMOGRAM  08/05/2020  . HEMOGLOBIN A1C  01/29/2021  . TETANUS/TDAP  04/18/2021 (Originally 03/10/1966)  . PNA vac Low Risk Adult (1 of 2 - PCV13) 04/18/2021 (Originally 03/10/2012)  . INFLUENZA VACCINE  05/01/2021  . FOOT EXAM  07/27/2021  . Hepatitis C Screening  Completed  . HPV VACCINES  Aged Out     ----------------------------------------------------------------------------------------------------------------------------------------------------------------------------------------------------------------- Physical Exam BP (!) 166/78 (BP Location: Left Arm, Patient Position: Sitting, Cuff Size: Large)   Pulse (!) 48   Temp 97.8 F (36.6 C)   Ht 5\' 4"  (1.626 m)   Wt 223 lb (101.2 kg)   SpO2 100%   BMI 38.28 kg/m   Physical Exam Constitutional:      Appearance: Normal appearance.  Eyes:     General: No scleral icterus. Cardiovascular:     Rate and Rhythm: Normal rate and regular rhythm.  Pulmonary:     Effort: Pulmonary effort is normal.     Breath sounds: Normal breath sounds.  Neurological:     General: No focal deficit present.     Mental Status: She is alert.  Psychiatric:        Mood and Affect: Mood normal.        Behavior: Behavior normal.     ------------------------------------------------------------------------------------------------------------------------------------------------------------------------------------------------------------------- Assessment and Plan  Benign essential hypertension BP elevated today.   She has had varying degree of control, ? If she is taking all medications as directed.  She will check what she is taking vs list provided today.  Low sodiu diet is also recommended as well.    CLL (chronic lymphocytic leukemia) Stable , followed by hematology  Type 2 diabetes mellitus without complication, with long-term current use of insulin (HCC) Dexcom ordered.   Check A1c today. Update lipid panel as well.     Meds ordered this encounter  Medications  . Continuous Blood Gluc Receiver (DEXCOM G6 RECEIVER) DEVI    Sig: Use to check glucose as needed.  Diagnosis: E11.65    Dispense:  1 each    Refill:  0  . Continuous Blood Gluc Sensor (DEXCOM G6 SENSOR) MISC    Sig: Use to check glucose as needed.  Replace every 10 days. Dx: E11.65    Dispense:  12 each    Refill:  1  . Continuous Blood Gluc Transmit (DEXCOM G6 TRANSMITTER) MISC    Sig: Use to check glucose as needed.  Replace every 3 months.  Dx: E11.65    Dispense:  1 each    Refill:  4    Return in about 3 months (around 05/18/2021) for HTN/T2DM.    This visit occurred during the SARS-CoV-2 public health emergency.  Safety protocols were in place, including screening questions prior to the visit, additional usage of staff PPE, and extensive cleaning of exam room while observing appropriate contact time as indicated for disinfecting solutions.

## 2021-02-19 NOTE — Assessment & Plan Note (Signed)
BP elevated today.  She has had varying degree of control, ? If she is taking all medications as directed.  She will check what she is taking vs list provided today.  Low sodiu diet is also recommended as well.

## 2021-02-23 ENCOUNTER — Encounter: Payer: Self-pay | Admitting: Family Medicine

## 2021-02-25 ENCOUNTER — Encounter (HOSPITAL_BASED_OUTPATIENT_CLINIC_OR_DEPARTMENT_OTHER): Payer: Self-pay | Admitting: Emergency Medicine

## 2021-02-25 ENCOUNTER — Other Ambulatory Visit: Payer: Self-pay

## 2021-02-25 ENCOUNTER — Emergency Department (HOSPITAL_BASED_OUTPATIENT_CLINIC_OR_DEPARTMENT_OTHER)
Admission: EM | Admit: 2021-02-25 | Discharge: 2021-02-25 | Disposition: A | Discharge status: Home / Self Care | Payer: Medicare Other | Attending: Emergency Medicine | Admitting: Emergency Medicine

## 2021-02-25 ENCOUNTER — Emergency Department (HOSPITAL_BASED_OUTPATIENT_CLINIC_OR_DEPARTMENT_OTHER): Payer: Medicare Other

## 2021-02-25 DIAGNOSIS — S39012A Strain of muscle, fascia and tendon of lower back, initial encounter: Secondary | ICD-10-CM

## 2021-02-25 DIAGNOSIS — M542 Cervicalgia: Secondary | ICD-10-CM | POA: Diagnosis not present

## 2021-02-25 DIAGNOSIS — E119 Type 2 diabetes mellitus without complications: Secondary | ICD-10-CM | POA: Insufficient documentation

## 2021-02-25 DIAGNOSIS — S161XXA Strain of muscle, fascia and tendon at neck level, initial encounter: Secondary | ICD-10-CM | POA: Insufficient documentation

## 2021-02-25 DIAGNOSIS — M47812 Spondylosis without myelopathy or radiculopathy, cervical region: Secondary | ICD-10-CM | POA: Diagnosis not present

## 2021-02-25 DIAGNOSIS — Z859 Personal history of malignant neoplasm, unspecified: Secondary | ICD-10-CM | POA: Diagnosis not present

## 2021-02-25 DIAGNOSIS — Z79899 Other long term (current) drug therapy: Secondary | ICD-10-CM | POA: Insufficient documentation

## 2021-02-25 DIAGNOSIS — S199XXA Unspecified injury of neck, initial encounter: Secondary | ICD-10-CM | POA: Diagnosis present

## 2021-02-25 DIAGNOSIS — M545 Low back pain, unspecified: Secondary | ICD-10-CM | POA: Diagnosis not present

## 2021-02-25 DIAGNOSIS — E049 Nontoxic goiter, unspecified: Secondary | ICD-10-CM | POA: Diagnosis not present

## 2021-02-25 DIAGNOSIS — Z041 Encounter for examination and observation following transport accident: Secondary | ICD-10-CM | POA: Diagnosis not present

## 2021-02-25 DIAGNOSIS — I10 Essential (primary) hypertension: Secondary | ICD-10-CM | POA: Diagnosis not present

## 2021-02-25 DIAGNOSIS — Y92410 Unspecified street and highway as the place of occurrence of the external cause: Secondary | ICD-10-CM | POA: Diagnosis not present

## 2021-02-25 NOTE — Discharge Instructions (Addendum)
You had some enlargement of your thyroid gland and it is recommended that your primary care doctor check an ultrasound of your thyroid.  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  If you continue to have back pain, you should follow-up with your primary care doctor.  Return here as needed for any worsening symptoms.
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
## 2021-02-27 ENCOUNTER — Telehealth: Payer: Self-pay

## 2021-02-27 NOTE — Telephone Encounter (Signed)
Transition Care Management Unsuccessful Follow-up Telephone Call  Date of discharge and from where:  02/25/2021 from [facility].   Attempts:  1st Attempt  Reason for unsuccessful TCM follow-up call:  Left voice message

## 2021-02-28 ENCOUNTER — Telehealth: Payer: Self-pay | Admitting: *Deleted

## 2021-02-28 NOTE — Telephone Encounter (Signed)
Transition Care Management Unsuccessful Follow-up Telephone Call  Date of discharge and from where:  02/25/2021 from [facility]  Attempts:  2nd Attempt  Reason for unsuccessful TCM follow-up call:  Left voice message

## 2021-02-28 NOTE — Telephone Encounter (Signed)
Call received from patient wanting to know if Dr Marin Olp is ok if she decides to get her port removed.  Dr. Marin Olp notified. Call placed back to patient and patient notified that Dr. Marin Olp states that he is ok with patient getting port removed if she would like it removed.  Pt is appreciative of call back and states that she would like her port removed.  Dr. Marin Olp notified and order placed per Dr. Marin Olp.

## 2021-03-01 ENCOUNTER — Telehealth: Payer: Self-pay

## 2021-03-01 ENCOUNTER — Telehealth: Payer: Self-pay | Admitting: *Deleted

## 2021-03-01 NOTE — Telephone Encounter (Signed)
appt made per sch message, per vanessa pt is moving and req to see dr pe to see about having port removed, pt aware of next avail appt ans is good with that    Megan Tate

## 2021-03-01 NOTE — Telephone Encounter (Signed)
Call received from patient to inform Dr. Marin Olp that she is moving out of the [place] on 04[place]24[place]21, would like to hold off on having port removed at this time and would like to see Dr. Marin Olp before she moves.  Dr. Marin Olp notified and message sent to scheduling.

## 2021-03-01 NOTE — Telephone Encounter (Signed)
Transition Care Management Unsuccessful Follow-up Telephone Call  Date of discharge and from where: 02/25/21 from [facility]  Attempts:  3rd Attempt  Reason for unsuccessful TCM follow-up call:  Unable to reach patient

## 2021-03-02 ENCOUNTER — Encounter: Payer: Self-pay | Admitting: Family Medicine

## 2021-03-02 ENCOUNTER — Ambulatory Visit (INDEPENDENT_AMBULATORY_CARE_PROVIDER_SITE_OTHER): Payer: Medicare Other | Admitting: Family Medicine

## 2021-03-02 ENCOUNTER — Other Ambulatory Visit: Payer: Self-pay

## 2021-03-02 DIAGNOSIS — M545 Low back pain, unspecified: Secondary | ICD-10-CM

## 2021-03-02 DIAGNOSIS — M542 Cervicalgia: Secondary | ICD-10-CM | POA: Insufficient documentation

## 2021-03-02 MED ORDER — BACLOFEN 10 MG PO TABS: 10.0000 mg | ORAL_TABLET | Freq: Three times a day (TID) | ORAL | 0 refills | Status: DC | PRN
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
## 2021-03-13 ENCOUNTER — Ambulatory Visit: Payer: Medicare Other | Attending: Family Medicine | Admitting: Physical Therapy

## 2021-03-13 ENCOUNTER — Other Ambulatory Visit: Payer: Self-pay

## 2021-03-13 ENCOUNTER — Encounter: Payer: Self-pay | Admitting: Physical Therapy

## 2021-03-13 DIAGNOSIS — M546 Pain in thoracic spine: Secondary | ICD-10-CM | POA: Diagnosis not present

## 2021-03-13 DIAGNOSIS — M542 Cervicalgia: Secondary | ICD-10-CM | POA: Diagnosis not present

## 2021-03-13 DIAGNOSIS — M6281 Muscle weakness (generalized): Secondary | ICD-10-CM | POA: Insufficient documentation

## 2021-03-13 DIAGNOSIS — M25612 Stiffness of left shoulder, not elsewhere classified: Secondary | ICD-10-CM | POA: Insufficient documentation

## 2021-03-13 DIAGNOSIS — M25512 Pain in left shoulder: Secondary | ICD-10-CM | POA: Diagnosis not present

## 2021-03-13 DIAGNOSIS — R293 Abnormal posture: Secondary | ICD-10-CM | POA: Insufficient documentation
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
## 2021-03-14 ENCOUNTER — Inpatient Hospital Stay: Payer: Medicare Other | Attending: Hematology & Oncology

## 2021-03-14 VITALS — BP 156/88 | HR 66 | Temp 98.0°F | Resp 18

## 2021-03-14 DIAGNOSIS — Z452 Encounter for adjustment and management of vascular access device: Secondary | ICD-10-CM | POA: Diagnosis not present

## 2021-03-14 DIAGNOSIS — C9110 Chronic lymphocytic leukemia of B-cell type not having achieved remission: Secondary | ICD-10-CM | POA: Diagnosis not present

## 2021-03-14 DIAGNOSIS — Z95828 Presence of other vascular implants and grafts: Secondary | ICD-10-CM

## 2021-03-14 MED ORDER — SODIUM CHLORIDE 0.9% FLUSH
10.0000 mL | INTRAVENOUS | Status: DC | PRN
  Administered 2021-03-14: 10 mL via INTRAVENOUS
  Filled 2021-03-14: qty 10

## 2021-03-14 MED ORDER — HEPARIN SOD (PORK) LOCK FLUSH 100 UNIT/ML IV SOLN
500.0000 [IU] | Freq: Once | INTRAVENOUS | Status: AC
  Administered 2021-03-14: 500 [IU] via INTRAVENOUS
  Filled 2021-03-14: qty 5

## 2021-03-14 NOTE — Patient Instructions (Signed)

## 2021-03-21 ENCOUNTER — Ambulatory Visit: Payer: Medicare Other | Admitting: Physical Therapy

## 2021-03-22 ENCOUNTER — Ambulatory Visit: Payer: Medicare Other

## 2021-03-22 ENCOUNTER — Other Ambulatory Visit: Payer: Self-pay

## 2021-03-22 DIAGNOSIS — M25612 Stiffness of left shoulder, not elsewhere classified: Secondary | ICD-10-CM | POA: Diagnosis not present

## 2021-03-22 DIAGNOSIS — R293 Abnormal posture: Secondary | ICD-10-CM | POA: Diagnosis not present

## 2021-03-22 DIAGNOSIS — M25512 Pain in left shoulder: Secondary | ICD-10-CM | POA: Diagnosis not present

## 2021-03-22 DIAGNOSIS — M546 Pain in thoracic spine: Secondary | ICD-10-CM

## 2021-03-22 DIAGNOSIS — M542 Cervicalgia: Secondary | ICD-10-CM

## 2021-03-22 DIAGNOSIS — M6281 Muscle weakness (generalized): Secondary | ICD-10-CM | POA: Diagnosis not present
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
## 2021-03-25 ENCOUNTER — Other Ambulatory Visit: Payer: Self-pay | Admitting: Family Medicine

## 2021-03-29 ENCOUNTER — Other Ambulatory Visit: Payer: Self-pay

## 2021-03-29 ENCOUNTER — Ambulatory Visit: Payer: Medicare Other | Admitting: Physical Therapy

## 2021-03-29 ENCOUNTER — Encounter: Payer: Self-pay | Admitting: Physical Therapy

## 2021-03-29 DIAGNOSIS — M542 Cervicalgia: Secondary | ICD-10-CM

## 2021-03-29 DIAGNOSIS — M25612 Stiffness of left shoulder, not elsewhere classified: Secondary | ICD-10-CM

## 2021-03-29 DIAGNOSIS — M546 Pain in thoracic spine: Secondary | ICD-10-CM

## 2021-03-29 DIAGNOSIS — R293 Abnormal posture: Secondary | ICD-10-CM

## 2021-03-29 DIAGNOSIS — M25512 Pain in left shoulder: Secondary | ICD-10-CM

## 2021-03-29 DIAGNOSIS — M6281 Muscle weakness (generalized): Secondary | ICD-10-CM | POA: Diagnosis not present

## 2021-03-29 MED ORDER — BD PEN NEEDLE NANO 2ND GEN 32G X 4 MM MISC: 99 refills | Status: AC
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
## 2021-04-05 ENCOUNTER — Other Ambulatory Visit: Payer: Self-pay

## 2021-04-05 ENCOUNTER — Ambulatory Visit: Payer: Medicare Other | Attending: Family Medicine

## 2021-04-05 DIAGNOSIS — M6281 Muscle weakness (generalized): Secondary | ICD-10-CM | POA: Diagnosis not present

## 2021-04-05 DIAGNOSIS — M545 Low back pain, unspecified: Secondary | ICD-10-CM | POA: Diagnosis not present

## 2021-04-05 DIAGNOSIS — R262 Difficulty in walking, not elsewhere classified: Secondary | ICD-10-CM | POA: Diagnosis not present

## 2021-04-05 DIAGNOSIS — M25612 Stiffness of left shoulder, not elsewhere classified: Secondary | ICD-10-CM | POA: Insufficient documentation

## 2021-04-05 DIAGNOSIS — M542 Cervicalgia: Secondary | ICD-10-CM | POA: Insufficient documentation

## 2021-04-05 DIAGNOSIS — M25512 Pain in left shoulder: Secondary | ICD-10-CM | POA: Insufficient documentation

## 2021-04-05 DIAGNOSIS — R293 Abnormal posture: Secondary | ICD-10-CM | POA: Insufficient documentation

## 2021-04-05 DIAGNOSIS — M546 Pain in thoracic spine: Secondary | ICD-10-CM | POA: Insufficient documentation
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
## 2021-04-06 ENCOUNTER — Inpatient Hospital Stay (HOSPITAL_BASED_OUTPATIENT_CLINIC_OR_DEPARTMENT_OTHER): Payer: Medicare Other | Admitting: Hematology & Oncology

## 2021-04-06 ENCOUNTER — Inpatient Hospital Stay: Payer: Medicare Other

## 2021-04-06 ENCOUNTER — Encounter: Payer: Self-pay | Admitting: Hematology & Oncology

## 2021-04-06 ENCOUNTER — Inpatient Hospital Stay: Payer: Medicare Other | Attending: Hematology & Oncology

## 2021-04-06 VITALS — BP 155/90 | HR 64 | Temp 98.8°F | Resp 18

## 2021-04-06 VITALS — Wt 223.0 lb

## 2021-04-06 DIAGNOSIS — Z79899 Other long term (current) drug therapy: Secondary | ICD-10-CM | POA: Diagnosis not present

## 2021-04-06 DIAGNOSIS — Z886 Allergy status to analgesic agent status: Secondary | ICD-10-CM | POA: Diagnosis not present

## 2021-04-06 DIAGNOSIS — C9110 Chronic lymphocytic leukemia of B-cell type not having achieved remission: Secondary | ICD-10-CM | POA: Insufficient documentation

## 2021-04-06 DIAGNOSIS — Z95828 Presence of other vascular implants and grafts: Secondary | ICD-10-CM

## 2021-04-06 DIAGNOSIS — E119 Type 2 diabetes mellitus without complications: Secondary | ICD-10-CM | POA: Insufficient documentation

## 2021-04-06 LAB — LACTATE DEHYDROGENASE: LDH: 110 U/L (ref 98–192)

## 2021-04-06 LAB — CMP (CANCER CENTER ONLY)
ALT: 9 U/L (ref 0–44)
AST: 11 U/L — ABNORMAL LOW (ref 15–41)
Albumin: 4 g/dL (ref 3.5–5.0)
Alkaline Phosphatase: 63 U/L (ref 38–126)
Anion gap: 7 (ref 5–15)
BUN: 17 mg/dL (ref 8–23)
CO2: 29 mmol/L (ref 22–32)
Calcium: 11.5 mg/dL — ABNORMAL HIGH (ref 8.9–10.3)
Chloride: 102 mmol/L (ref 98–111)
Creatinine: 1.24 mg/dL — ABNORMAL HIGH (ref 0.44–1.00)
GFR, Estimated: 46 mL/min — ABNORMAL LOW (ref >60)
Glucose, Bld: 230 mg/dL — ABNORMAL HIGH (ref 70–99)
Potassium: 3.9 mmol/L (ref 3.5–5.1)
Sodium: 138 mmol/L (ref 135–145)
Total Bilirubin: 0.7 mg/dL (ref 0.3–1.2)
Total Protein: 5.7 g/dL — ABNORMAL LOW (ref 6.5–8.1)

## 2021-04-06 LAB — CBC WITH DIFFERENTIAL (CANCER CENTER ONLY)
Abs Immature Granulocytes: 0.02 10*3/uL (ref 0.00–0.07)
Basophils Absolute: 0 10*3/uL (ref 0.0–0.1)
Basophils Relative: 0 %
Eosinophils Absolute: 0.2 10*3/uL (ref 0.0–0.5)
Eosinophils Relative: 2 %
HCT: 35.8 % — ABNORMAL LOW (ref 36.0–46.0)
Hemoglobin: 11.5 g/dL — ABNORMAL LOW (ref 12.0–15.0)
Immature Granulocytes: 0 %
Lymphocytes Relative: 40 %
Lymphs Abs: 3.2 10*3/uL (ref 0.7–4.0)
MCH: 24.4 pg — ABNORMAL LOW (ref 26.0–34.0)
MCHC: 32.1 g/dL (ref 30.0–36.0)
MCV: 76 fL — ABNORMAL LOW (ref 80.0–100.0)
Monocytes Absolute: 0.9 10*3/uL (ref 0.1–1.0)
Monocytes Relative: 11 %
Neutro Abs: 3.6 10*3/uL (ref 1.7–7.7)
Neutrophils Relative %: 47 %
Platelet Count: 214 10*3/uL (ref 150–400)
RBC: 4.71 MIL/uL (ref 3.87–5.11)
RDW: 13.4 % (ref 11.5–15.5)
WBC Count: 7.9 10*3/uL (ref 4.0–10.5)
nRBC: 0 % (ref 0.0–0.2)

## 2021-04-06 MED ORDER — SODIUM CHLORIDE 0.9% FLUSH
10.0000 mL | Freq: Once | INTRAVENOUS | Status: AC
Start: 2021-04-06 — End: 2021-04-06
  Administered 2021-04-06: 10 mL via INTRAVENOUS
  Filled 2021-04-06: qty 10

## 2021-04-06 MED ORDER — HEPARIN SOD (PORK) LOCK FLUSH 100 UNIT/ML IV SOLN
500.0000 [IU] | Freq: Once | INTRAVENOUS | Status: AC
  Administered 2021-04-06: 500 [IU] via INTRAVENOUS
  Filled 2021-04-06: qty 5

## 2021-04-06 NOTE — Patient Instructions (Signed)

## 2021-04-06 NOTE — Progress Notes (Signed)
Hematology and Oncology Follow Up Visit  Megan Tate [id number] Mar 29, 1946 75 y.o. 04/06/2021   Principle Diagnosis:  Chronic lymphocytic leukemia- Trisomy 12   Past Therapy:             Status post 6 cycles of Gazyva/Bendamustine - completed 12/2014 Maintenance Gazyva every 3 months s/p cycle 11 - completed 2 years in June 2018  Current Therapy:   Observation   Interim History:  Megan Tate is here today for follow-up.  She is doing okay.  The big news is that she will be headed down to [place] later on this month.  She wants to be down there for a couple months.  I am sure she will have a wonderful time down there.  She did have a very nice July 4 weekend.  She and her husband stayed home.  She has had no problems healthwise.  She still is dealing with her diabetes.  Her blood sugar this morning was 230.  She has had no problems with nausea or vomiting.  There is no change in bowel or bladder habits.  She has had no leg swelling.  She has had no neuropathy in the hands or feet.  There is been no headache.  Currently, I would have to say her performance status is probably ECOG 1.    Medications:  Allergies as of 04/06/2021       Reactions   Tylenol [acetaminophen] Other (See Comments)   Messes with stomach        Medication List        Accurate as of April 06, 2021 11:13 AM. If you have any questions, ask your nurse or doctor.          amLODipine 5 MG tablet Commonly known as: NORVASC TAKE 1 TABLET BY MOUTH EVERY DAY   baclofen 10 MG tablet Commonly known as: LIORESAL Take 1 tablet (10 mg total) by mouth 3 (three) times daily as needed for muscle spasms.   BD Pen Needle Nano 2nd Gen 32G X 4 MM Misc Generic drug: Insulin Pen Needle Dx DM E11.8 Inject insulin twice daily.   cloNIDine 0.2 MG tablet Commonly known as: CATAPRES TAKE 1 TABLET BY MOUTH TWICE A DAY   Dexcom G6 Receiver Devi Use to check glucose as needed.  Diagnosis: E11.65   Dexcom G6  Sensor Misc Use to check glucose as needed.  Replace every 10 days. Dx: E11.65   Dexcom G6 Transmitter Misc Use to check glucose as needed.  Replace every 3 months.  Dx: E11.65   dorzolamide-timolol 22.3-6.8 MG/ML ophthalmic solution Commonly known as: COSOPT Place 1 drop into both eyes 2 (two) times daily.   folic acid 1 MG tablet Commonly known as: FOLVITE TAKE 2 TABLETS BY MOUTH EVERY DAY   glucose blood test strip Commonly known as: ONE TOUCH ULTRA TEST USE TO TEST BLOOD SUGAR BEFORE MEALS AND AT BEDTIME   Janumet XR 50-1000 MG Tb24 Generic drug: SitaGLIPtin-MetFORMIN HCl TAKE 2 TABLETS BY MOUTH EVERY DAY   Levemir FlexTouch 100 UNIT/ML FlexPen Generic drug: insulin detemir INJECT 20 UNITS INTO THE SKIN DAILY.   Linzess 145 MCG Caps capsule Generic drug: linaclotide TAKE 1 CAPSULE BY MOUTH DAILY BEFORE BREAKFAST.   losartan 100 MG tablet Commonly known as: COZAAR Take 1 tablet (100 mg total) by mouth daily.   meclizine 25 MG tablet Commonly known as: ANTIVERT Take 1 tablet (25 mg total) by mouth 3 (three) times daily as needed for dizziness.  metoprolol succinate 50 MG 24 hr tablet Commonly known as: TOPROL-XL TAKE 1 TABLET (50 MG TOTAL) BY MOUTH DAILY. TAKE WITH OR IMMEDIATELY FOLLOWING A MEAL.   rosuvastatin 20 MG tablet Commonly known as: CRESTOR TAKE 1 TABLET BY MOUTH EVERY DAY   spironolactone 50 MG tablet Commonly known as: ALDACTONE TAKE 1 TABLET BY MOUTH EVERY DAY        Allergies:  Allergies  Allergen Reactions   Tylenol [Acetaminophen] Other (See Comments)    Messes with stomach    Past Medical History, Surgical history, Social history, and Family History were reviewed and updated.  Review of Systems: Review of Systems  Constitutional: Negative.   HENT: Negative.    Eyes: Negative.   Respiratory: Negative.    Cardiovascular: Negative.   Gastrointestinal: Negative.   Genitourinary: Negative.   Musculoskeletal: Negative.   Skin:  Negative.   Neurological: Negative.   Endo/Heme/Allergies: Negative.   Psychiatric/Behavioral: Negative.      Physical Exam:  weight is 223 lb (101.2 kg).   Wt Readings from Last 3 Encounters:  04/06/21 223 lb (101.2 kg)  03/02/21 223 lb (101.2 kg)  02/25/21 204 lb (92.5 kg)    Physical Exam Vitals reviewed.  HENT:     Head: Normocephalic and atraumatic.  Eyes:     Pupils: Pupils are equal, round, and reactive to light.  Cardiovascular:     Rate and Rhythm: Normal rate and regular rhythm.     Heart sounds: Normal heart sounds.  Pulmonary:     Effort: Pulmonary effort is normal.     Breath sounds: Normal breath sounds.  Abdominal:     General: Bowel sounds are normal.     Palpations: Abdomen is soft.  Musculoskeletal:        General: No tenderness or deformity. Normal range of motion.     Cervical back: Normal range of motion.  Lymphadenopathy:     Cervical: No cervical adenopathy.  Skin:    General: Skin is warm and dry.     Findings: No erythema or rash.  Neurological:     Mental Status: She is alert and oriented to person, place, and time.  Psychiatric:        Behavior: Behavior normal.        Thought Content: Thought content normal.        Judgment: Judgment normal.     Lab Results  Component Value Date   WBC 7.9 04/06/2021   HGB 11.5 (L) 04/06/2021   HCT 35.8 (L) 04/06/2021   MCV 76.0 (L) 04/06/2021   PLT 214 04/06/2021   Lab Results  Component Value Date   FERRITIN 207 11/01/2020   IRON 151 (H) 11/01/2020   TIBC 290 11/01/2020   UIBC 139 11/01/2020   IRONPCTSAT 52 11/01/2020   Lab Results  Component Value Date   RETICCTPCT 1.4 11/01/2020   RBC 4.71 04/06/2021   RETICCTABS 65.4 04/07/2014   Lab Results  Component Value Date   KPAFRELGTCHN 13.1 08/09/2020   LAMBDASER 4.0 (L) 08/09/2020   KAPLAMBRATIO 3.28 (H) 08/09/2020   Lab Results  Component Value Date   IGGSERUM 442 (L) 01/31/2021   IGA 18 (L) 01/31/2021   IGMSERUM <5 (L)  01/31/2021   Lab Results  Component Value Date   TOTALPROTELP 5.4 (L) 08/09/2020   ALBUMINELP 3.2 08/09/2020   A1GS 0.2 08/09/2020   A2GS 0.8 08/09/2020   BETS 0.8 08/09/2020   BETA2SER 0.2 03/30/2015   GAMS 0.4 08/09/2020   MSPIKE Not  Observed 08/09/2020   SPEI * 03/30/2015     Chemistry      Component Value Date/Time   NA 138 04/06/2021 0950   NA 142 09/05/2017 0751   K 3.9 04/06/2021 0950   K 3.8 09/05/2017 0751   CL 102 04/06/2021 0950   CL 104 09/05/2017 0751   CO2 29 04/06/2021 0950   CO2 27 09/05/2017 0751   BUN 17 04/06/2021 0950   BUN 11 09/05/2017 0751   CREATININE 1.24 (H) 04/06/2021 0950   CREATININE 1.1 09/05/2017 0751      Component Value Date/Time   CALCIUM 11.5 (H) 04/06/2021 0950   CALCIUM 10.8 (H) 09/05/2017 0751   ALKPHOS 63 04/06/2021 0950   ALKPHOS 88 (H) 09/05/2017 0751   AST 11 (L) 04/06/2021 0950   ALT 9 04/06/2021 0950   ALT 14 09/05/2017 0751   BILITOT 0.7 04/06/2021 0950      Impression and Plan: Ms. Obi is a pleasant 75 yo African American female with chronic lymphocytic leukemia. She completed treatment with Gazyva/Bendamustine in April 2016 and maintenance Gazyva in June 2018.   As always, everything is quite stable.  I do not see any issues with respect to her CLL that will cause her problems.  I looked at her blood smear.  Everything looks pretty stable.  Her lymphocytes appear mature.  As always, I still think that the diabetes is going to be the determinant of her overall mortality.  Hopefully, she will be able to get a continuous glucose monitor so that she can be more diligent with checking her blood sugars.  At this point, I told her to let us know when she comes back from [place].  She is not sure when she will come back.  Again she wants to be down there for 2 months.  I told her to let us know when she comes back so we can make an appointment to see her and flush her Port-A-Cath.    Volanda Napoleon, MD 7/7/202211:13 AM

## 2021-04-07 ENCOUNTER — Telehealth: Payer: Self-pay | Admitting: *Deleted

## 2021-04-07 LAB — PROTEIN ELECTROPHORESIS, SERUM, WITH REFLEX
A/G Ratio: 1.4 (ref 0.7–1.7)
Albumin ELP: 3.3 g/dL (ref 2.9–4.4)
Alpha-1-Globulin: 0.2 g/dL (ref 0.0–0.4)
Alpha-2-Globulin: 0.9 g/dL (ref 0.4–1.0)
Beta Globulin: 0.9 g/dL (ref 0.7–1.3)
Gamma Globulin: 0.3 g/dL — ABNORMAL LOW (ref 0.4–1.8)
Globulin, Total: 2.3 g/dL (ref 2.2–3.9)
Total Protein ELP: 5.6 g/dL — ABNORMAL LOW (ref 6.0–8.5)

## 2021-04-07 LAB — IGG, IGA, IGM
IgA: 19 mg/dL — ABNORMAL LOW (ref 64–422)
IgG (Immunoglobin G), Serum: 413 mg/dL — ABNORMAL LOW (ref 586–1602)
IgM (Immunoglobulin M), Srm: <5 mg/dL — ABNORMAL LOW (ref 26–217)

## 2021-04-07 NOTE — Telephone Encounter (Signed)
No 04/06/21 los 

## 2021-04-09 LAB — KAPPA/LAMBDA LIGHT CHAINS
Kappa free light chain: 10.8 mg/L (ref 3.3–19.4)
Kappa, lambda light chain ratio: 2.7 — ABNORMAL HIGH (ref 0.26–1.65)
Lambda free light chains: 4 mg/L — ABNORMAL LOW (ref 5.7–26.3)

## 2021-04-12 ENCOUNTER — Other Ambulatory Visit: Payer: Self-pay

## 2021-04-12 ENCOUNTER — Ambulatory Visit: Payer: Medicare Other | Admitting: Rehabilitative and Restorative Service Providers"

## 2021-04-12 ENCOUNTER — Encounter: Payer: Self-pay | Admitting: Rehabilitative and Restorative Service Providers"

## 2021-04-12 DIAGNOSIS — M545 Low back pain, unspecified: Secondary | ICD-10-CM

## 2021-04-12 DIAGNOSIS — M25612 Stiffness of left shoulder, not elsewhere classified: Secondary | ICD-10-CM

## 2021-04-12 DIAGNOSIS — M25512 Pain in left shoulder: Secondary | ICD-10-CM | POA: Diagnosis not present

## 2021-04-12 DIAGNOSIS — R293 Abnormal posture: Secondary | ICD-10-CM

## 2021-04-12 DIAGNOSIS — R262 Difficulty in walking, not elsewhere classified: Secondary | ICD-10-CM

## 2021-04-12 DIAGNOSIS — M546 Pain in thoracic spine: Secondary | ICD-10-CM | POA: Diagnosis not present

## 2021-04-12 DIAGNOSIS — M6281 Muscle weakness (generalized): Secondary | ICD-10-CM

## 2021-04-12 DIAGNOSIS — M542 Cervicalgia: Secondary | ICD-10-CM
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
## 2021-04-19 ENCOUNTER — Other Ambulatory Visit: Payer: Self-pay

## 2021-04-19 ENCOUNTER — Emergency Department (HOSPITAL_BASED_OUTPATIENT_CLINIC_OR_DEPARTMENT_OTHER)
Admission: EM | Admit: 2021-04-19 | Discharge: 2021-04-19 | Disposition: A | Discharge status: Home / Self Care | Payer: Medicare Other | Attending: Emergency Medicine | Admitting: Emergency Medicine

## 2021-04-19 ENCOUNTER — Telehealth: Payer: Self-pay

## 2021-04-19 ENCOUNTER — Emergency Department (HOSPITAL_BASED_OUTPATIENT_CLINIC_OR_DEPARTMENT_OTHER): Payer: Medicare Other

## 2021-04-19 ENCOUNTER — Encounter (HOSPITAL_BASED_OUTPATIENT_CLINIC_OR_DEPARTMENT_OTHER): Payer: Self-pay

## 2021-04-19 DIAGNOSIS — M79661 Pain in right lower leg: Secondary | ICD-10-CM | POA: Insufficient documentation

## 2021-04-19 DIAGNOSIS — Z859 Personal history of malignant neoplasm, unspecified: Secondary | ICD-10-CM | POA: Insufficient documentation

## 2021-04-19 DIAGNOSIS — M1711 Unilateral primary osteoarthritis, right knee: Secondary | ICD-10-CM | POA: Diagnosis not present

## 2021-04-19 DIAGNOSIS — M79604 Pain in right leg: Secondary | ICD-10-CM | POA: Diagnosis not present

## 2021-04-19 DIAGNOSIS — Z79899 Other long term (current) drug therapy: Secondary | ICD-10-CM | POA: Insufficient documentation

## 2021-04-19 DIAGNOSIS — Z794 Long term (current) use of insulin: Secondary | ICD-10-CM | POA: Insufficient documentation

## 2021-04-19 DIAGNOSIS — I10 Essential (primary) hypertension: Secondary | ICD-10-CM | POA: Insufficient documentation

## 2021-04-19 DIAGNOSIS — M5459 Other low back pain: Secondary | ICD-10-CM | POA: Diagnosis not present

## 2021-04-19 DIAGNOSIS — E119 Type 2 diabetes mellitus without complications: Secondary | ICD-10-CM | POA: Diagnosis not present

## 2021-04-19 DIAGNOSIS — M545 Low back pain, unspecified: Secondary | ICD-10-CM | POA: Diagnosis not present

## 2021-04-19 MED ORDER — LIDOCAINE 5 % EX PTCH
1.0000 | MEDICATED_PATCH | CUTANEOUS | 0 refills | Status: DC
Start: 2021-04-19 — End: 2022-03-21

## 2021-04-19 MED ORDER — DICLOFENAC SODIUM 1 % EX GEL: 4.0000 g | Freq: Four times a day (QID) | CUTANEOUS | 0 refills | Status: DC
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
## 2021-04-19 NOTE — Telephone Encounter (Signed)
Please call patient and schedule hospital follow-up with Dr. Zigmund Daniel the 1st of next week.  Thanks

## 2021-04-19 NOTE — Telephone Encounter (Signed)
Please have her follow up.  Thanks!  CM

## 2021-04-19 NOTE — Telephone Encounter (Signed)
Pt lvm stating she went to the ER due to physical therapy not working.   She states the ER doctor was concerned about  low HR: 43bpm & thyroid concern.  Please advise.
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
## 2021-04-20 ENCOUNTER — Telehealth: Payer: Self-pay

## 2021-04-20 NOTE — Telephone Encounter (Signed)
Appointment has been made. No further questions at this time.  

## 2021-04-20 NOTE — Telephone Encounter (Signed)
Transition Care Management Follow-up Telephone Call Date of discharge and from where: 04/20/2019 from [facility] How have you been since you were released from the hospital? Pt stated that she is feeling better and did not have any questions or concerns at this time.  Any questions or concerns? No  Items Reviewed: Did the pt receive and understand the discharge instructions provided? No  Medications obtained and verified? Yes  Other? No  Any new allergies since your discharge? No  Dietary orders reviewed? No Do you have support at home? Yes    Functional Questionnaire: (I = Independent and D = Dependent) ADLs: I  Bathing/Dressing- I  Meal Prep- I  Eating- I  Maintaining continence- I  Transferring/Ambulation- I  Managing Meds- I   Follow up appointments reviewed:  PCP Hospital f/u appt confirmed? Yes  Scheduled to see Luetta Nutting, DO on 04/25/2021 @ 10:10am. Are transportation arrangements needed? No  If their condition worsens, is the pt aware to call PCP or go to the Emergency Dept.? Yes Was the patient provided with contact information for the PCP's office or ED? Yes Was to pt encouraged to call back with questions or concerns? Yes

## 2021-04-22 ENCOUNTER — Other Ambulatory Visit: Payer: Self-pay | Admitting: Hematology & Oncology

## 2021-04-22 DIAGNOSIS — C9110 Chronic lymphocytic leukemia of B-cell type not having achieved remission: Secondary | ICD-10-CM

## 2021-04-24 ENCOUNTER — Encounter: Payer: Self-pay | Admitting: Family

## 2021-04-25 ENCOUNTER — Ambulatory Visit (INDEPENDENT_AMBULATORY_CARE_PROVIDER_SITE_OTHER): Payer: Medicare Other | Admitting: Family Medicine

## 2021-04-25 ENCOUNTER — Encounter: Payer: Self-pay | Admitting: Family Medicine

## 2021-04-25 ENCOUNTER — Other Ambulatory Visit: Payer: Self-pay

## 2021-04-25 VITALS — BP 192/74 | HR 45 | Temp 97.7°F | Ht 64.0 in | Wt 220.0 lb

## 2021-04-25 DIAGNOSIS — M545 Low back pain, unspecified: Secondary | ICD-10-CM | POA: Diagnosis not present

## 2021-04-25 DIAGNOSIS — E049 Nontoxic goiter, unspecified: Secondary | ICD-10-CM | POA: Diagnosis not present

## 2021-04-25 DIAGNOSIS — I10 Essential (primary) hypertension: Secondary | ICD-10-CM

## 2021-04-25 MED ORDER — HYDRALAZINE HCL 25 MG PO TABS: ORAL_TABLET | ORAL | 3 refills | Status: AC
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
## 2021-04-25 NOTE — Assessment & Plan Note (Signed)
She continues to have some bradycardia but is asymptomatic with this.  I am when I have her discontinue her metoprolol and I am going to add hydralazine.  I would expect this to cause some reflex tachycardia.  She will continue her additional blood pressure medications.

## 2021-04-25 NOTE — Patient Instructions (Addendum)
Great to see you today! Stop metoprolol Start hydralazine (1/2 tab)12.'5mg'$  three times per day for 2 weeks then increase to a full tab ('25mg'$ ) three times per day. See me again in 6 weeks.  If back pain is not improving with physical therapy we'll plan to get an MRI

## 2021-04-25 NOTE — Assessment & Plan Note (Addendum)
Back pain improved at this time.  Recommend continuation of topical Voltaren, lidocaine and baclofen as needed.  She will continue physical therapy for now as well.  We did discuss that if not improving will consider getting an MRI of her lumbar spine.

## 2021-04-25 NOTE — Assessment & Plan Note (Addendum)
There was some thyroid enlargement as well as calcifications noted on previous CT scan of her neck.  Additional imaging with thyroid ultrasound ordered.  TSH ordered

## 2021-04-26 ENCOUNTER — Encounter: Payer: Medicare Other | Admitting: Physical Therapy

## 2021-04-26 LAB — TSH: TSH: 0.52 mIU/L (ref 0.40–4.50)

## 2021-04-26 LAB — BASIC METABOLIC PANEL
BUN/Creatinine Ratio: 10 (calc) (ref 6–22)
BUN: 13 mg/dL (ref 7–25)
CO2: 28 mmol/L (ref 20–32)
Calcium: 10.8 mg/dL — ABNORMAL HIGH (ref 8.6–10.4)
Chloride: 103 mmol/L (ref 98–110)
Creat: 1.26 mg/dL — ABNORMAL HIGH (ref 0.60–1.00)
Glucose, Bld: 200 mg/dL — ABNORMAL HIGH (ref 65–99)
Potassium: 4.6 mmol/L (ref 3.5–5.3)
Sodium: 140 mmol/L (ref 135–146)

## 2021-05-09 DIAGNOSIS — Z6837 Body mass index (BMI) 37.0-37.9, adult: Secondary | ICD-10-CM | POA: Diagnosis not present

## 2021-05-09 DIAGNOSIS — I10 Essential (primary) hypertension: Secondary | ICD-10-CM | POA: Diagnosis not present

## 2021-05-09 DIAGNOSIS — E119 Type 2 diabetes mellitus without complications: Secondary | ICD-10-CM | POA: Diagnosis not present

## 2021-05-09 DIAGNOSIS — E785 Hyperlipidemia, unspecified: Secondary | ICD-10-CM | POA: Diagnosis not present

## 2021-05-09 DIAGNOSIS — Z8579 Personal history of other malignant neoplasms of lymphoid, hematopoietic and related tissues: Secondary | ICD-10-CM | POA: Diagnosis not present

## 2021-05-12 DIAGNOSIS — M25519 Pain in unspecified shoulder: Secondary | ICD-10-CM | POA: Diagnosis not present

## 2021-05-12 DIAGNOSIS — M5459 Other low back pain: Secondary | ICD-10-CM | POA: Diagnosis not present

## 2021-05-15 ENCOUNTER — Other Ambulatory Visit: Payer: Self-pay | Admitting: *Deleted

## 2021-05-15 DIAGNOSIS — C9110 Chronic lymphocytic leukemia of B-cell type not having achieved remission: Secondary | ICD-10-CM

## 2021-05-16 ENCOUNTER — Inpatient Hospital Stay: Payer: Medicare Other

## 2021-05-16 ENCOUNTER — Inpatient Hospital Stay: Payer: Medicare Other | Admitting: Hematology & Oncology

## 2021-05-16 ENCOUNTER — Inpatient Hospital Stay: Payer: Medicare Other | Attending: Hematology & Oncology

## 2021-05-18 ENCOUNTER — Encounter: Payer: Self-pay | Admitting: *Deleted

## 2021-05-18 NOTE — Progress Notes (Signed)
Signed medical record request received from [facility].  Records faxed to [phone] as requested per pt.

## 2021-05-19 DIAGNOSIS — M5459 Other low back pain: Secondary | ICD-10-CM | POA: Diagnosis not present

## 2021-05-19 DIAGNOSIS — M25519 Pain in unspecified shoulder: Secondary | ICD-10-CM | POA: Diagnosis not present

## 2021-05-24 ENCOUNTER — Ambulatory Visit: Payer: Medicare Other | Admitting: Family Medicine

## 2021-05-26 DIAGNOSIS — M25519 Pain in unspecified shoulder: Secondary | ICD-10-CM | POA: Diagnosis not present

## 2021-05-26 DIAGNOSIS — M5459 Other low back pain: Secondary | ICD-10-CM | POA: Diagnosis not present

## 2021-05-30 ENCOUNTER — Other Ambulatory Visit: Payer: Self-pay | Admitting: Family Medicine

## 2021-05-30 DIAGNOSIS — C9110 Chronic lymphocytic leukemia of B-cell type not having achieved remission: Secondary | ICD-10-CM

## 2021-05-30 DIAGNOSIS — M25519 Pain in unspecified shoulder: Secondary | ICD-10-CM | POA: Diagnosis not present

## 2021-05-30 DIAGNOSIS — M5459 Other low back pain: Secondary | ICD-10-CM | POA: Diagnosis not present

## 2021-06-07 ENCOUNTER — Telehealth: Payer: Self-pay | Admitting: *Deleted

## 2021-06-07 NOTE — Telephone Encounter (Signed)
Call received from the [facility] for [place] requesting medical records for pt. Records faxed to [phone] as requested.

## 2021-06-08 DIAGNOSIS — M25519 Pain in unspecified shoulder: Secondary | ICD-10-CM | POA: Diagnosis not present

## 2021-06-08 DIAGNOSIS — M5459 Other low back pain: Secondary | ICD-10-CM | POA: Diagnosis not present

## 2021-06-09 ENCOUNTER — Other Ambulatory Visit: Payer: Self-pay

## 2021-06-09 NOTE — Telephone Encounter (Signed)
Never prescribed by Dr Matthews.  

## 2021-06-12 MED ORDER — CLONIDINE HCL 0.2 MG PO TABS: 0.2000 mg | ORAL_TABLET | Freq: Two times a day (BID) | ORAL | 1 refills | Status: AC

## 2021-06-13 DIAGNOSIS — M5459 Other low back pain: Secondary | ICD-10-CM | POA: Diagnosis not present

## 2021-06-13 DIAGNOSIS — M25519 Pain in unspecified shoulder: Secondary | ICD-10-CM | POA: Diagnosis not present

## 2021-06-19 DIAGNOSIS — C9111 Chronic lymphocytic leukemia of B-cell type in remission: Secondary | ICD-10-CM | POA: Diagnosis not present

## 2021-07-13 IMAGING — CR CHEST - 2 VIEW
2 series · 2 of 2 positions shown · non-contrast
Comparison: Portable exam 1148 hours compared to 06/03/2018

CLINICAL DATA: Port placement

EXAM:
CHEST - 2 VIEW

[w chest pa]
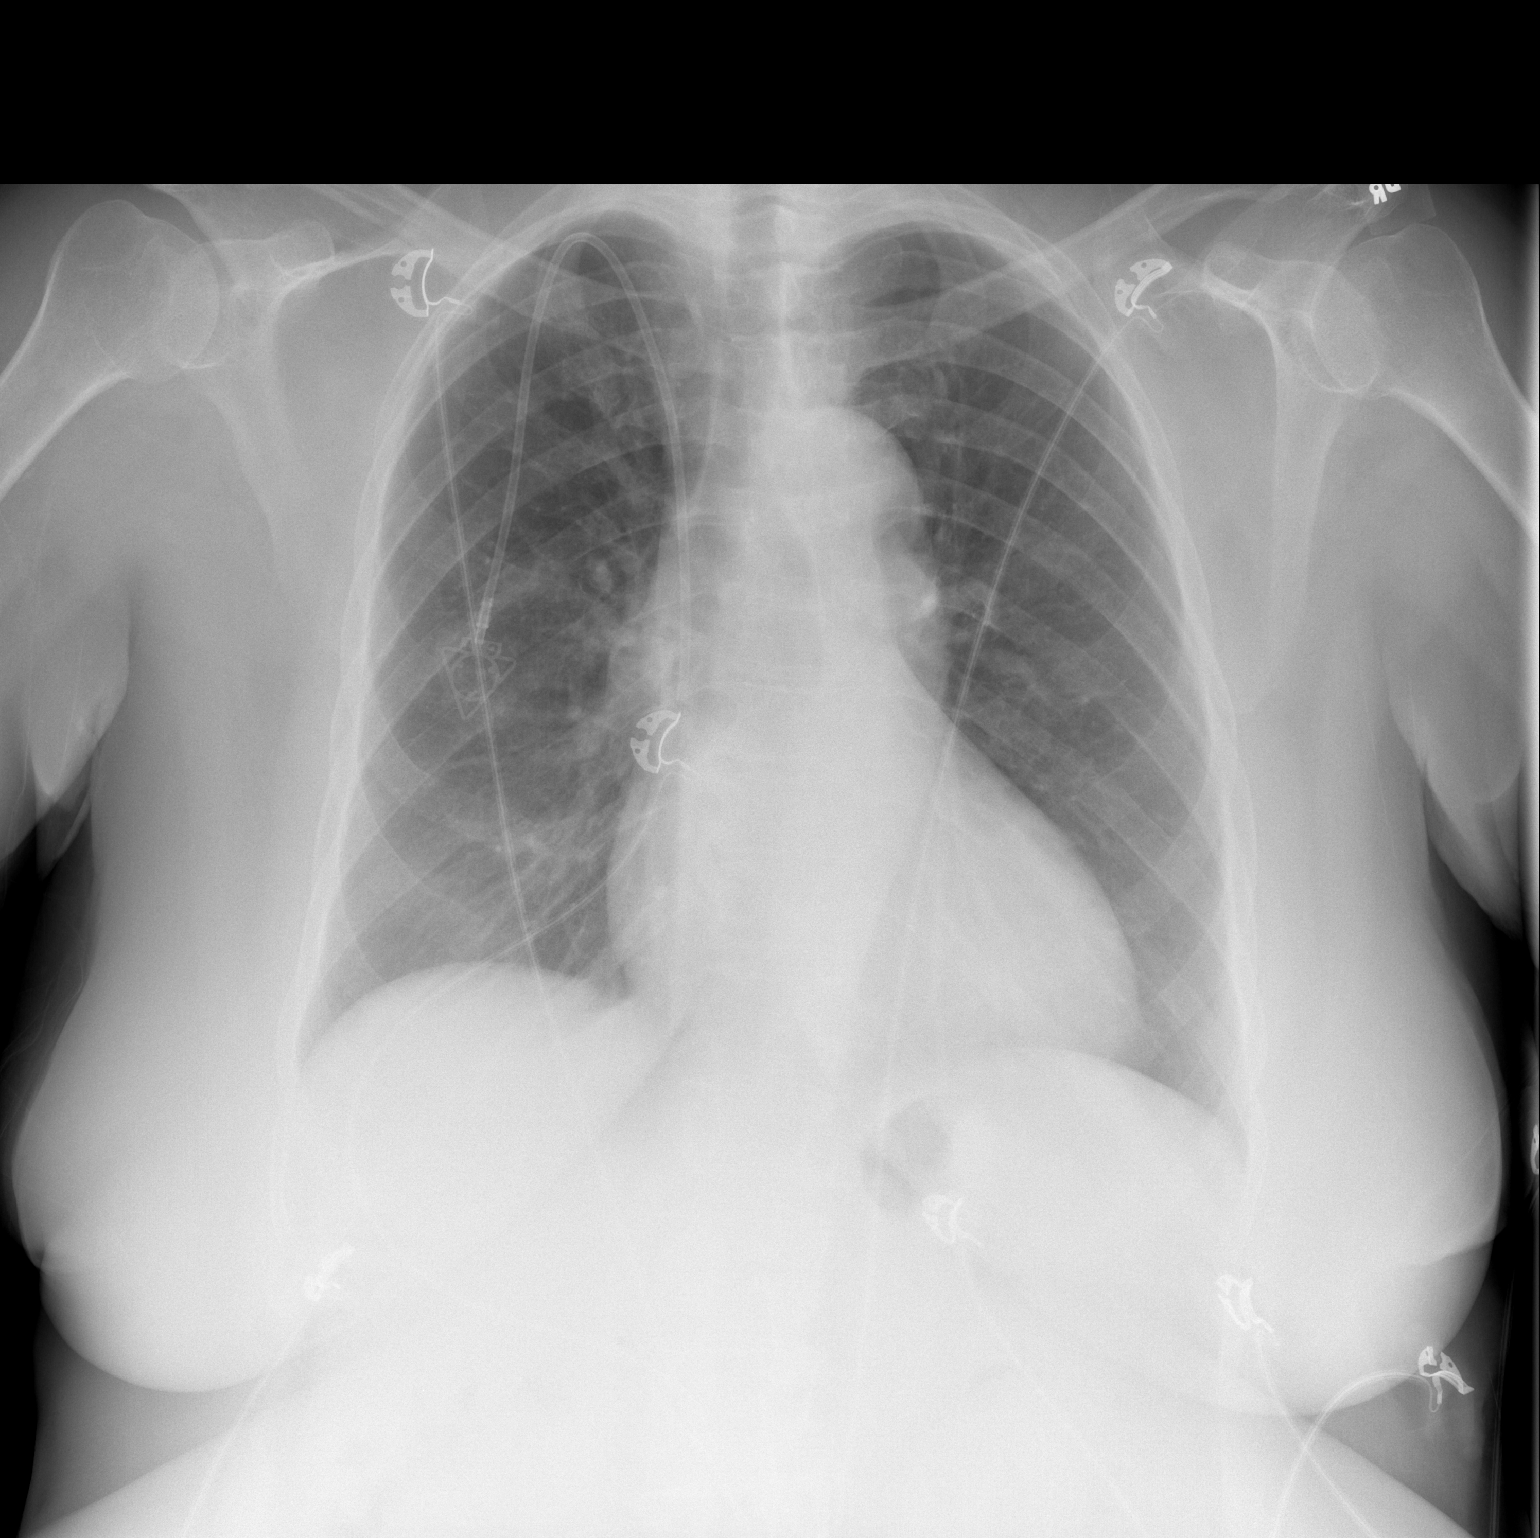

[w chest lat]
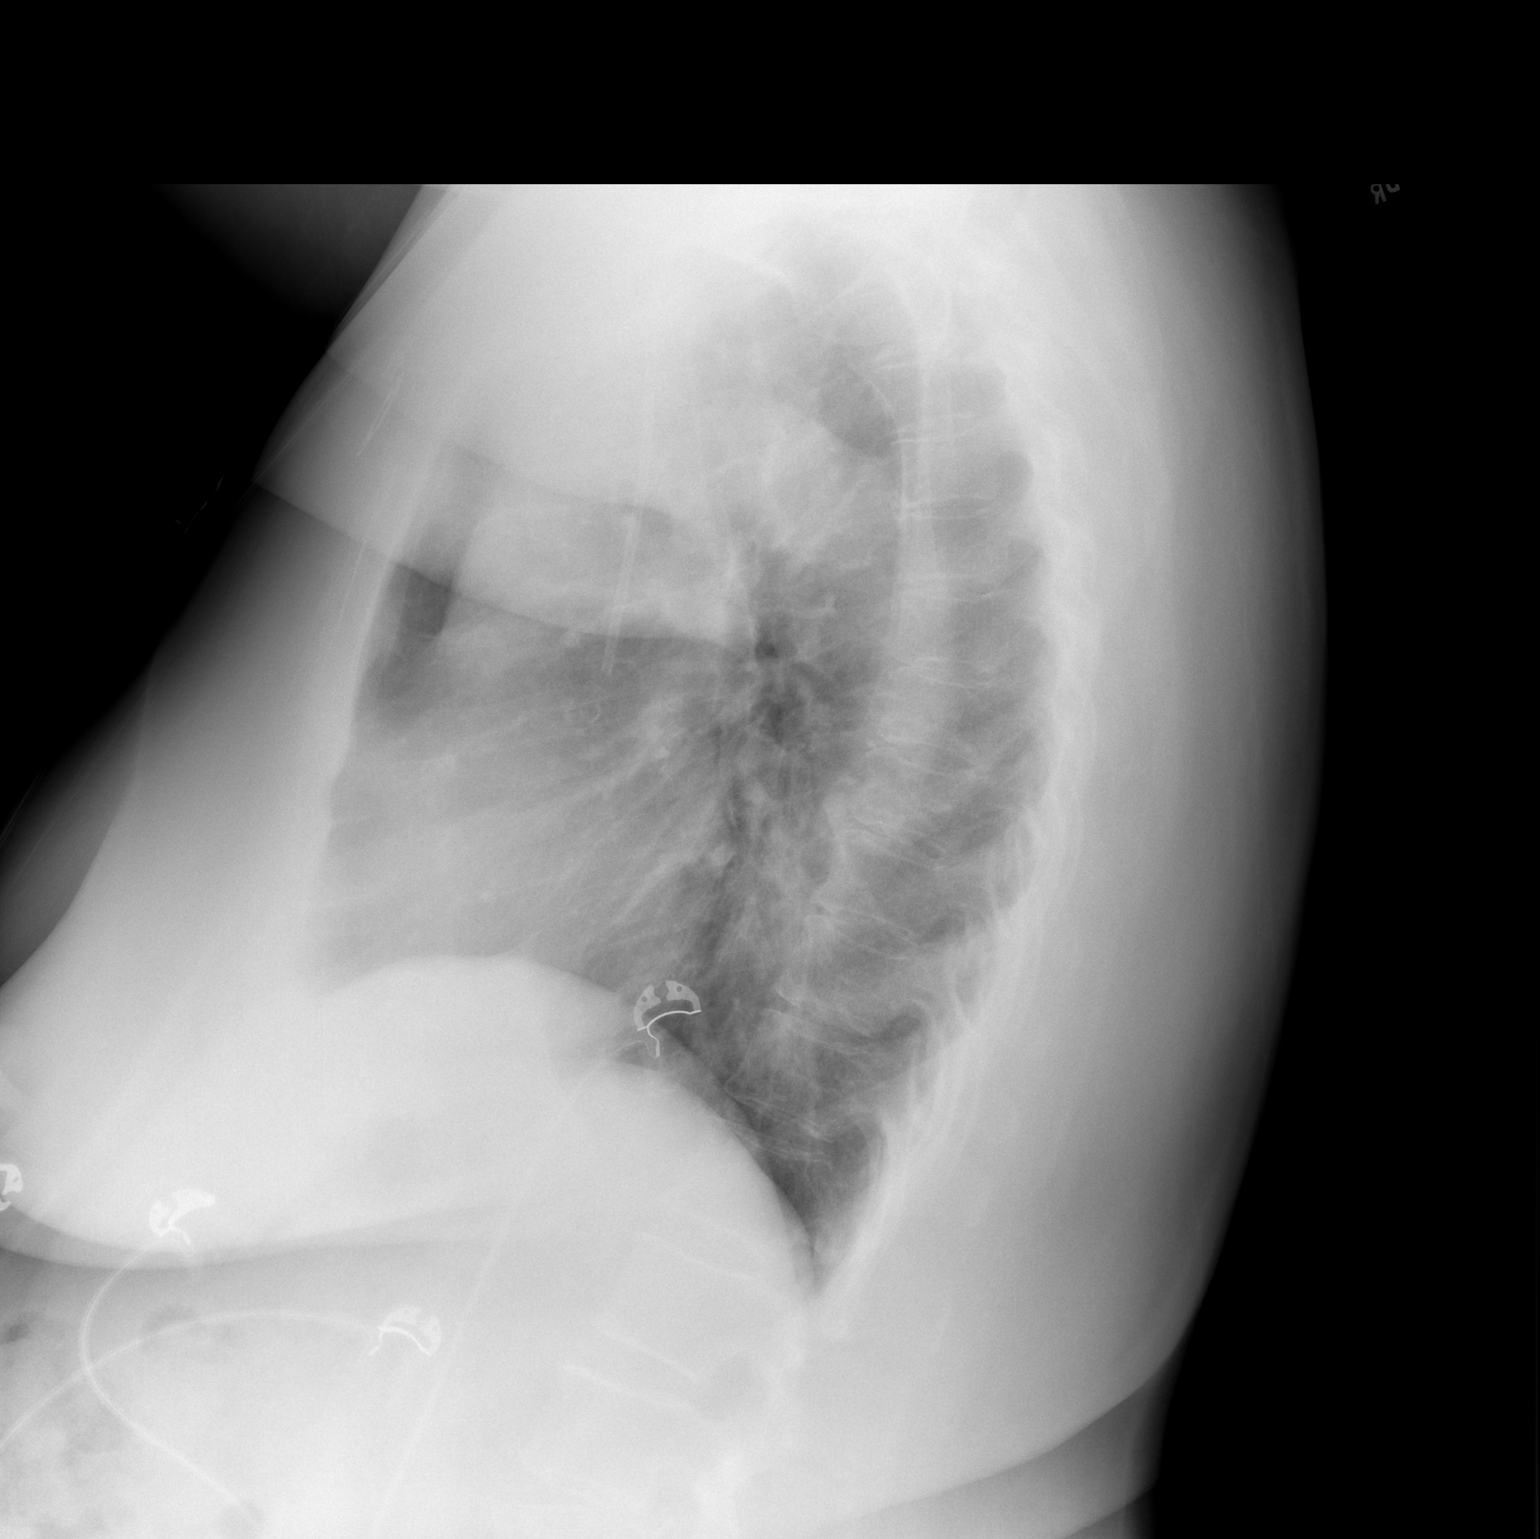

[2 of 2 positions shown; findings below may reference images not displayed]

FINDINGS: RIGHT jugular Port-A-Cath with tip projecting over SVC.

Upper normal size of cardiac silhouette.

Mediastinal contours and pulmonary vascularity normal.

Lungs clear.

No infiltrate, pleural effusion or pneumothorax.

Bones unremarkable.
IMPRESSION: No acute abnormalities.

## 2021-07-20 ENCOUNTER — Other Ambulatory Visit: Payer: Self-pay | Admitting: Family Medicine

## 2021-07-20 DIAGNOSIS — C9110 Chronic lymphocytic leukemia of B-cell type not having achieved remission: Secondary | ICD-10-CM

## 2021-08-01 DIAGNOSIS — I10 Essential (primary) hypertension: Secondary | ICD-10-CM | POA: Diagnosis not present

## 2021-08-01 DIAGNOSIS — E785 Hyperlipidemia, unspecified: Secondary | ICD-10-CM | POA: Diagnosis not present

## 2021-08-01 DIAGNOSIS — E119 Type 2 diabetes mellitus without complications: Secondary | ICD-10-CM | POA: Diagnosis not present

## 2021-08-07 DIAGNOSIS — Z7984 Long term (current) use of oral hypoglycemic drugs: Secondary | ICD-10-CM | POA: Diagnosis not present

## 2021-08-07 DIAGNOSIS — E785 Hyperlipidemia, unspecified: Secondary | ICD-10-CM | POA: Diagnosis not present

## 2021-08-07 DIAGNOSIS — N1832 Chronic kidney disease, stage 3b: Secondary | ICD-10-CM | POA: Diagnosis not present

## 2021-08-07 DIAGNOSIS — E119 Type 2 diabetes mellitus without complications: Secondary | ICD-10-CM | POA: Diagnosis not present

## 2021-08-07 DIAGNOSIS — H409 Unspecified glaucoma: Secondary | ICD-10-CM | POA: Diagnosis not present

## 2021-08-07 DIAGNOSIS — C9111 Chronic lymphocytic leukemia of B-cell type in remission: Secondary | ICD-10-CM | POA: Diagnosis not present

## 2021-08-07 DIAGNOSIS — I129 Hypertensive chronic kidney disease with stage 1 through stage 4 chronic kidney disease, or unspecified chronic kidney disease: Secondary | ICD-10-CM | POA: Diagnosis not present

## 2021-08-09 DIAGNOSIS — J09X2 Influenza due to identified novel influenza A virus with other respiratory manifestations: Secondary | ICD-10-CM | POA: Diagnosis not present

## 2021-08-09 DIAGNOSIS — J101 Influenza due to other identified influenza virus with other respiratory manifestations: Secondary | ICD-10-CM | POA: Diagnosis not present

## 2021-08-09 DIAGNOSIS — R509 Fever, unspecified: Secondary | ICD-10-CM | POA: Diagnosis not present

## 2021-08-09 DIAGNOSIS — E119 Type 2 diabetes mellitus without complications: Secondary | ICD-10-CM | POA: Diagnosis not present

## 2021-08-09 DIAGNOSIS — I10 Essential (primary) hypertension: Secondary | ICD-10-CM | POA: Diagnosis not present

## 2021-08-09 DIAGNOSIS — R0602 Shortness of breath: Secondary | ICD-10-CM | POA: Diagnosis not present

## 2021-08-17 DIAGNOSIS — C9111 Chronic lymphocytic leukemia of B-cell type in remission: Secondary | ICD-10-CM | POA: Diagnosis not present

## 2021-08-20 ENCOUNTER — Other Ambulatory Visit: Payer: Self-pay | Admitting: Family Medicine

## 2021-10-06 DIAGNOSIS — E119 Type 2 diabetes mellitus without complications: Secondary | ICD-10-CM | POA: Diagnosis not present

## 2021-10-06 DIAGNOSIS — R0602 Shortness of breath: Secondary | ICD-10-CM | POA: Diagnosis not present

## 2021-10-06 DIAGNOSIS — I10 Essential (primary) hypertension: Secondary | ICD-10-CM | POA: Diagnosis not present

## 2021-10-16 DIAGNOSIS — N183 Chronic kidney disease, stage 3 unspecified: Secondary | ICD-10-CM | POA: Diagnosis not present

## 2021-10-16 DIAGNOSIS — D509 Iron deficiency anemia, unspecified: Secondary | ICD-10-CM | POA: Diagnosis not present

## 2021-10-16 DIAGNOSIS — R718 Other abnormality of red blood cells: Secondary | ICD-10-CM | POA: Diagnosis not present

## 2021-10-16 DIAGNOSIS — C9111 Chronic lymphocytic leukemia of B-cell type in remission: Secondary | ICD-10-CM | POA: Diagnosis not present

## 2021-10-24 DIAGNOSIS — E049 Nontoxic goiter, unspecified: Secondary | ICD-10-CM | POA: Diagnosis not present

## 2021-10-24 DIAGNOSIS — C9111 Chronic lymphocytic leukemia of B-cell type in remission: Secondary | ICD-10-CM | POA: Diagnosis not present

## 2021-10-24 DIAGNOSIS — J9811 Atelectasis: Secondary | ICD-10-CM | POA: Diagnosis not present

## 2021-10-24 DIAGNOSIS — N183 Chronic kidney disease, stage 3 unspecified: Secondary | ICD-10-CM | POA: Diagnosis not present

## 2021-10-24 DIAGNOSIS — N200 Calculus of kidney: Secondary | ICD-10-CM | POA: Diagnosis not present

## 2021-10-24 DIAGNOSIS — D509 Iron deficiency anemia, unspecified: Secondary | ICD-10-CM | POA: Diagnosis not present

## 2021-11-03 ENCOUNTER — Other Ambulatory Visit: Payer: Self-pay

## 2021-11-03 ENCOUNTER — Other Ambulatory Visit: Payer: Self-pay | Admitting: Hematology & Oncology

## 2021-11-03 DIAGNOSIS — E785 Hyperlipidemia, unspecified: Secondary | ICD-10-CM | POA: Diagnosis not present

## 2021-11-03 DIAGNOSIS — C9110 Chronic lymphocytic leukemia of B-cell type not having achieved remission: Secondary | ICD-10-CM

## 2021-11-03 DIAGNOSIS — M25551 Pain in right hip: Secondary | ICD-10-CM

## 2021-11-03 DIAGNOSIS — Z1231 Encounter for screening mammogram for malignant neoplasm of breast: Secondary | ICD-10-CM

## 2021-11-03 DIAGNOSIS — E119 Type 2 diabetes mellitus without complications: Secondary | ICD-10-CM | POA: Diagnosis not present

## 2021-11-03 DIAGNOSIS — I10 Essential (primary) hypertension: Secondary | ICD-10-CM | POA: Diagnosis not present

## 2021-11-03 MED ORDER — LOSARTAN POTASSIUM 100 MG PO TABS: 100.0000 mg | ORAL_TABLET | Freq: Every day | ORAL | 0 refills | Status: DC

## 2021-11-09 DIAGNOSIS — E119 Type 2 diabetes mellitus without complications: Secondary | ICD-10-CM | POA: Diagnosis not present

## 2021-11-09 DIAGNOSIS — I129 Hypertensive chronic kidney disease with stage 1 through stage 4 chronic kidney disease, or unspecified chronic kidney disease: Secondary | ICD-10-CM | POA: Diagnosis not present

## 2021-11-09 DIAGNOSIS — C9111 Chronic lymphocytic leukemia of B-cell type in remission: Secondary | ICD-10-CM | POA: Diagnosis not present

## 2021-11-09 DIAGNOSIS — J302 Other seasonal allergic rhinitis: Secondary | ICD-10-CM | POA: Diagnosis not present

## 2021-11-09 DIAGNOSIS — R718 Other abnormality of red blood cells: Secondary | ICD-10-CM | POA: Diagnosis not present

## 2021-11-09 DIAGNOSIS — E041 Nontoxic single thyroid nodule: Secondary | ICD-10-CM | POA: Diagnosis not present

## 2021-11-09 DIAGNOSIS — N183 Chronic kidney disease, stage 3 unspecified: Secondary | ICD-10-CM | POA: Diagnosis not present

## 2021-11-09 DIAGNOSIS — N1832 Chronic kidney disease, stage 3b: Secondary | ICD-10-CM | POA: Diagnosis not present

## 2021-11-20 ENCOUNTER — Other Ambulatory Visit: Payer: Self-pay | Admitting: Hematology & Oncology

## 2021-11-20 DIAGNOSIS — M25551 Pain in right hip: Secondary | ICD-10-CM

## 2021-11-20 DIAGNOSIS — Z1231 Encounter for screening mammogram for malignant neoplasm of breast: Secondary | ICD-10-CM

## 2021-11-20 DIAGNOSIS — C9110 Chronic lymphocytic leukemia of B-cell type not having achieved remission: Secondary | ICD-10-CM

## 2021-11-21 ENCOUNTER — Other Ambulatory Visit: Payer: Self-pay

## 2021-11-21 DIAGNOSIS — C9110 Chronic lymphocytic leukemia of B-cell type not having achieved remission: Secondary | ICD-10-CM

## 2021-11-21 MED ORDER — JANUMET XR 50-1000 MG PO TB24: 2.0000 | ORAL_TABLET | Freq: Every day | ORAL | 0 refills | Status: AC

## 2021-11-23 ENCOUNTER — Other Ambulatory Visit: Payer: Self-pay | Admitting: Family Medicine

## 2021-11-23 DIAGNOSIS — C9110 Chronic lymphocytic leukemia of B-cell type not having achieved remission: Secondary | ICD-10-CM

## 2021-11-23 DIAGNOSIS — D500 Iron deficiency anemia secondary to blood loss (chronic): Secondary | ICD-10-CM

## 2021-11-24 DIAGNOSIS — R319 Hematuria, unspecified: Secondary | ICD-10-CM | POA: Diagnosis not present

## 2021-11-24 DIAGNOSIS — N390 Urinary tract infection, site not specified: Secondary | ICD-10-CM | POA: Diagnosis not present

## 2021-11-30 DIAGNOSIS — E559 Vitamin D deficiency, unspecified: Secondary | ICD-10-CM | POA: Diagnosis not present

## 2021-11-30 DIAGNOSIS — H5203 Hypermetropia, bilateral: Secondary | ICD-10-CM | POA: Diagnosis not present

## 2021-11-30 DIAGNOSIS — E042 Nontoxic multinodular goiter: Secondary | ICD-10-CM | POA: Diagnosis not present

## 2021-11-30 DIAGNOSIS — Z8639 Personal history of other endocrine, nutritional and metabolic disease: Secondary | ICD-10-CM | POA: Diagnosis not present

## 2021-12-14 DIAGNOSIS — E041 Nontoxic single thyroid nodule: Secondary | ICD-10-CM | POA: Diagnosis not present

## 2022-01-04 DIAGNOSIS — E042 Nontoxic multinodular goiter: Secondary | ICD-10-CM | POA: Diagnosis not present

## 2022-01-04 DIAGNOSIS — E211 Secondary hyperparathyroidism, not elsewhere classified: Secondary | ICD-10-CM | POA: Diagnosis not present

## 2022-02-01 DIAGNOSIS — E119 Type 2 diabetes mellitus without complications: Secondary | ICD-10-CM | POA: Diagnosis not present

## 2022-02-01 DIAGNOSIS — E785 Hyperlipidemia, unspecified: Secondary | ICD-10-CM | POA: Diagnosis not present

## 2022-02-01 DIAGNOSIS — I10 Essential (primary) hypertension: Secondary | ICD-10-CM | POA: Diagnosis not present

## 2022-02-14 DIAGNOSIS — E119 Type 2 diabetes mellitus without complications: Secondary | ICD-10-CM | POA: Diagnosis not present

## 2022-02-14 DIAGNOSIS — E041 Nontoxic single thyroid nodule: Secondary | ICD-10-CM | POA: Diagnosis not present

## 2022-02-14 DIAGNOSIS — R609 Edema, unspecified: Secondary | ICD-10-CM | POA: Diagnosis not present

## 2022-02-14 DIAGNOSIS — N2581 Secondary hyperparathyroidism of renal origin: Secondary | ICD-10-CM | POA: Diagnosis not present

## 2022-02-14 DIAGNOSIS — N1831 Chronic kidney disease, stage 3a: Secondary | ICD-10-CM | POA: Diagnosis not present

## 2022-02-14 DIAGNOSIS — I129 Hypertensive chronic kidney disease with stage 1 through stage 4 chronic kidney disease, or unspecified chronic kidney disease: Secondary | ICD-10-CM | POA: Diagnosis not present

## 2022-02-14 DIAGNOSIS — E049 Nontoxic goiter, unspecified: Secondary | ICD-10-CM | POA: Diagnosis not present

## 2022-02-23 DIAGNOSIS — R0602 Shortness of breath: Secondary | ICD-10-CM | POA: Diagnosis not present

## 2022-02-23 DIAGNOSIS — R609 Edema, unspecified: Secondary | ICD-10-CM | POA: Diagnosis not present

## 2022-02-23 DIAGNOSIS — R058 Other specified cough: Secondary | ICD-10-CM | POA: Diagnosis not present

## 2022-03-08 DIAGNOSIS — N183 Chronic kidney disease, stage 3 unspecified: Secondary | ICD-10-CM | POA: Diagnosis not present

## 2022-03-08 DIAGNOSIS — R718 Other abnormality of red blood cells: Secondary | ICD-10-CM | POA: Diagnosis not present

## 2022-03-08 DIAGNOSIS — D509 Iron deficiency anemia, unspecified: Secondary | ICD-10-CM | POA: Diagnosis not present

## 2022-03-08 DIAGNOSIS — C9111 Chronic lymphocytic leukemia of B-cell type in remission: Secondary | ICD-10-CM | POA: Diagnosis not present

## 2022-03-08 DIAGNOSIS — D569 Thalassemia, unspecified: Secondary | ICD-10-CM | POA: Diagnosis not present

## 2022-03-08 DIAGNOSIS — E041 Nontoxic single thyroid nodule: Secondary | ICD-10-CM | POA: Diagnosis not present

## 2022-03-16 ENCOUNTER — Other Ambulatory Visit: Payer: Self-pay | Admitting: Family Medicine

## 2022-03-16 DIAGNOSIS — C9110 Chronic lymphocytic leukemia of B-cell type not having achieved remission: Secondary | ICD-10-CM

## 2022-03-16 NOTE — Telephone Encounter (Signed)
Patient stated she no longer lives in [place]. She lives in [place], [place] now. I have removed Dr Zigmund Daniel as PCP.

## 2022-03-16 NOTE — Telephone Encounter (Signed)
Please contact pt to schedule appt. Last seen 03/2021. No additional medication refills.

## 2022-03-17 ENCOUNTER — Encounter (HOSPITAL_COMMUNITY)
Admission: EM | Disposition: A | Discharge status: Home / Self Care | Payer: Self-pay | Source: Home / Self Care | Attending: Internal Medicine

## 2022-03-17 ENCOUNTER — Inpatient Hospital Stay (HOSPITAL_BASED_OUTPATIENT_CLINIC_OR_DEPARTMENT_OTHER)
Admission: EM | Admit: 2022-03-17 | Discharge: 2022-03-21 | DRG: 330 | Disposition: A | Discharge status: Home / Self Care | Payer: Medicare Other | Attending: Internal Medicine | Admitting: Internal Medicine

## 2022-03-17 ENCOUNTER — Other Ambulatory Visit: Payer: Self-pay

## 2022-03-17 ENCOUNTER — Encounter (HOSPITAL_BASED_OUTPATIENT_CLINIC_OR_DEPARTMENT_OTHER): Payer: Self-pay | Admitting: Emergency Medicine

## 2022-03-17 ENCOUNTER — Inpatient Hospital Stay (HOSPITAL_COMMUNITY): Payer: Medicare Other | Admitting: Anesthesiology

## 2022-03-17 ENCOUNTER — Emergency Department (HOSPITAL_BASED_OUTPATIENT_CLINIC_OR_DEPARTMENT_OTHER): Payer: Medicare Other

## 2022-03-17 DIAGNOSIS — Z794 Long term (current) use of insulin: Secondary | ICD-10-CM

## 2022-03-17 DIAGNOSIS — Z7984 Long term (current) use of oral hypoglycemic drugs: Secondary | ICD-10-CM

## 2022-03-17 DIAGNOSIS — I160 Hypertensive urgency: Secondary | ICD-10-CM | POA: Diagnosis present

## 2022-03-17 DIAGNOSIS — E669 Obesity, unspecified: Secondary | ICD-10-CM | POA: Diagnosis present

## 2022-03-17 DIAGNOSIS — I889 Nonspecific lymphadenitis, unspecified: Secondary | ICD-10-CM | POA: Diagnosis present

## 2022-03-17 DIAGNOSIS — I10 Essential (primary) hypertension: Secondary | ICD-10-CM | POA: Diagnosis present

## 2022-03-17 DIAGNOSIS — E785 Hyperlipidemia, unspecified: Secondary | ICD-10-CM | POA: Diagnosis not present

## 2022-03-17 DIAGNOSIS — H409 Unspecified glaucoma: Secondary | ICD-10-CM | POA: Diagnosis present

## 2022-03-17 DIAGNOSIS — E119 Type 2 diabetes mellitus without complications: Secondary | ICD-10-CM | POA: Diagnosis not present

## 2022-03-17 DIAGNOSIS — K37 Unspecified appendicitis: Secondary | ICD-10-CM | POA: Diagnosis not present

## 2022-03-17 DIAGNOSIS — M109 Gout, unspecified: Secondary | ICD-10-CM | POA: Diagnosis not present

## 2022-03-17 DIAGNOSIS — K353 Acute appendicitis with localized peritonitis, without perforation or gangrene: Secondary | ICD-10-CM | POA: Diagnosis not present

## 2022-03-17 DIAGNOSIS — Z6834 Body mass index (BMI) 34.0-34.9, adult: Secondary | ICD-10-CM

## 2022-03-17 DIAGNOSIS — K388 Other specified diseases of appendix: Secondary | ICD-10-CM | POA: Diagnosis not present

## 2022-03-17 DIAGNOSIS — K3580 Unspecified acute appendicitis: Principal | ICD-10-CM | POA: Diagnosis present

## 2022-03-17 DIAGNOSIS — Z886 Allergy status to analgesic agent status: Secondary | ICD-10-CM

## 2022-03-17 DIAGNOSIS — C9111 Chronic lymphocytic leukemia of B-cell type in remission: Secondary | ICD-10-CM | POA: Diagnosis not present

## 2022-03-17 DIAGNOSIS — K660 Peritoneal adhesions (postprocedural) (postinfection): Secondary | ICD-10-CM | POA: Diagnosis not present

## 2022-03-17 DIAGNOSIS — Z8249 Family history of ischemic heart disease and other diseases of the circulatory system: Secondary | ICD-10-CM

## 2022-03-17 DIAGNOSIS — N281 Cyst of kidney, acquired: Secondary | ICD-10-CM | POA: Diagnosis not present

## 2022-03-17 DIAGNOSIS — C9110 Chronic lymphocytic leukemia of B-cell type not having achieved remission: Secondary | ICD-10-CM | POA: Diagnosis present

## 2022-03-17 DIAGNOSIS — Z79899 Other long term (current) drug therapy: Secondary | ICD-10-CM | POA: Diagnosis not present

## 2022-03-17 HISTORY — PX: LAPAROSCOPIC APPENDECTOMY: SHX408

## 2022-03-17 LAB — URINALYSIS, ROUTINE W REFLEX MICROSCOPIC
Bilirubin Urine: NEGATIVE
Glucose, UA: NEGATIVE mg/dL
Hgb urine dipstick: NEGATIVE
Ketones, ur: NEGATIVE mg/dL
Nitrite: NEGATIVE
Protein, ur: NEGATIVE mg/dL
Specific Gravity, Urine: 1.015 (ref 1.005–1.030)
pH: 5.5 (ref 5.0–8.0)

## 2022-03-17 LAB — CBC WITH DIFFERENTIAL/PLATELET
Abs Immature Granulocytes: 0.03 10*3/uL (ref 0.00–0.07)
Basophils Absolute: 0 10*3/uL (ref 0.0–0.1)
Basophils Relative: 0 %
Eosinophils Absolute: 0.2 10*3/uL (ref 0.0–0.5)
Eosinophils Relative: 2 %
HCT: 36.8 % (ref 36.0–46.0)
Hemoglobin: 11.8 g/dL — ABNORMAL LOW (ref 12.0–15.0)
Immature Granulocytes: 0 %
Lymphocytes Relative: 34 %
Lymphs Abs: 2.9 10*3/uL (ref 0.7–4.0)
MCH: 24.4 pg — ABNORMAL LOW (ref 26.0–34.0)
MCHC: 32.1 g/dL (ref 30.0–36.0)
MCV: 76.2 fL — ABNORMAL LOW (ref 80.0–100.0)
Monocytes Absolute: 1.1 10*3/uL — ABNORMAL HIGH (ref 0.1–1.0)
Monocytes Relative: 13 %
Neutro Abs: 4.3 10*3/uL (ref 1.7–7.7)
Neutrophils Relative %: 51 %
Platelets: 225 10*3/uL (ref 150–400)
RBC: 4.83 MIL/uL (ref 3.87–5.11)
RDW: 13.7 % (ref 11.5–15.5)
WBC: 8.5 10*3/uL (ref 4.0–10.5)
nRBC: 0 % (ref 0.0–0.2)

## 2022-03-17 LAB — URINALYSIS, MICROSCOPIC (REFLEX)

## 2022-03-17 LAB — COMPREHENSIVE METABOLIC PANEL
ALT: 10 U/L (ref 0–44)
AST: 14 U/L — ABNORMAL LOW (ref 15–41)
Albumin: 3.5 g/dL (ref 3.5–5.0)
Alkaline Phosphatase: 88 U/L (ref 38–126)
Anion gap: 5 (ref 5–15)
BUN: 12 mg/dL (ref 8–23)
CO2: 27 mmol/L (ref 22–32)
Calcium: 10.4 mg/dL — ABNORMAL HIGH (ref 8.9–10.3)
Chloride: 104 mmol/L (ref 98–111)
Creatinine, Ser: 1.2 mg/dL — ABNORMAL HIGH (ref 0.44–1.00)
GFR, Estimated: 47 mL/min — ABNORMAL LOW (ref >60)
Glucose, Bld: 204 mg/dL — ABNORMAL HIGH (ref 70–99)
Potassium: 3.6 mmol/L (ref 3.5–5.1)
Sodium: 136 mmol/L (ref 135–145)
Total Bilirubin: 0.7 mg/dL (ref 0.3–1.2)
Total Protein: 6.1 g/dL — ABNORMAL LOW (ref 6.5–8.1)

## 2022-03-17 LAB — GLUCOSE, CAPILLARY
Glucose-Capillary: 176 mg/dL — ABNORMAL HIGH (ref 70–99)
Glucose-Capillary: 181 mg/dL — ABNORMAL HIGH (ref 70–99)

## 2022-03-17 LAB — LIPASE, BLOOD: Lipase: 26 U/L (ref 11–51)

## 2022-03-17 SURGERY — APPENDECTOMY, LAPAROSCOPIC
Anesthesia: General

## 2022-03-17 MED ORDER — DEXAMETHASONE SODIUM PHOSPHATE 10 MG/ML IJ SOLN
INTRAMUSCULAR | Status: AC
  Filled 2022-03-17: qty 1

## 2022-03-17 MED ORDER — SUGAMMADEX SODIUM 500 MG/5ML IV SOLN
INTRAVENOUS | Status: AC
  Filled 2022-03-17: qty 5

## 2022-03-17 MED ORDER — DIPHENHYDRAMINE HCL 50 MG/ML IJ SOLN: 12.5000 mg | Freq: Four times a day (QID) | INTRAMUSCULAR | Status: DC | PRN

## 2022-03-17 MED ORDER — ONDANSETRON HCL 4 MG/2ML IJ SOLN
INTRAMUSCULAR | Status: AC
  Filled 2022-03-17: qty 2

## 2022-03-17 MED ORDER — ONDANSETRON 4 MG PO TBDP: 4.0000 mg | ORAL_TABLET | Freq: Four times a day (QID) | ORAL | Status: DC | PRN

## 2022-03-17 MED ORDER — LEVOCETIRIZINE DIHYDROCHLORIDE 5 MG PO TABS: 5.0000 mg | ORAL_TABLET | Freq: Every day | ORAL | Status: DC

## 2022-03-17 MED ORDER — FENTANYL CITRATE PF 50 MCG/ML IJ SOSY
PREFILLED_SYRINGE | INTRAMUSCULAR | Status: AC
  Filled 2022-03-17: qty 1

## 2022-03-17 MED ORDER — BUPIVACAINE-EPINEPHRINE (PF) 0.25% -1:200000 IJ SOLN
INTRAMUSCULAR | Status: AC
  Filled 2022-03-17: qty 30

## 2022-03-17 MED ORDER — BUPIVACAINE LIPOSOME 1.3 % IJ SUSP
INTRAMUSCULAR | Status: AC
  Filled 2022-03-17: qty 20

## 2022-03-17 MED ORDER — MORPHINE SULFATE (PF) 4 MG/ML IV SOLN
4.0000 mg | Freq: Once | INTRAVENOUS | Status: DC
  Filled 2022-03-17: qty 1

## 2022-03-17 MED ORDER — ONDANSETRON HCL 4 MG/2ML IJ SOLN: 4.0000 mg | Freq: Four times a day (QID) | INTRAMUSCULAR | Status: DC | PRN

## 2022-03-17 MED ORDER — FENTANYL CITRATE (PF) 100 MCG/2ML IJ SOLN
INTRAMUSCULAR | Status: AC
  Filled 2022-03-17: qty 2

## 2022-03-17 MED ORDER — IBUPROFEN 200 MG PO TABS: 600.0000 mg | ORAL_TABLET | Freq: Four times a day (QID) | ORAL | Status: DC | PRN

## 2022-03-17 MED ORDER — HYDRALAZINE HCL 25 MG PO TABS
25.0000 mg | ORAL_TABLET | Freq: Two times a day (BID) | ORAL | Status: DC
  Administered 2022-03-17 – 2022-03-21 (×8): 25 mg via ORAL
  Filled 2022-03-17 (×8): qty 1

## 2022-03-17 MED ORDER — PROPOFOL 1000 MG/100ML IV EMUL
INTRAVENOUS | Status: AC
  Filled 2022-03-17: qty 400

## 2022-03-17 MED ORDER — ENOXAPARIN SODIUM 40 MG/0.4ML IJ SOSY
40.0000 mg | PREFILLED_SYRINGE | INTRAMUSCULAR | Status: DC
  Administered 2022-03-18 – 2022-03-21 (×4): 40 mg via SUBCUTANEOUS
  Filled 2022-03-17 (×4): qty 0.4

## 2022-03-17 MED ORDER — DORZOLAMIDE HCL-TIMOLOL MAL 2-0.5 % OP SOLN
1.0000 [drp] | Freq: Two times a day (BID) | OPHTHALMIC | Status: DC
  Administered 2022-03-17 – 2022-03-21 (×8): 1 [drp] via OPHTHALMIC
  Filled 2022-03-17: qty 10

## 2022-03-17 MED ORDER — ROCURONIUM BROMIDE 10 MG/ML (PF) SYRINGE
PREFILLED_SYRINGE | INTRAVENOUS | Status: AC
  Filled 2022-03-17: qty 10

## 2022-03-17 MED ORDER — BUPIVACAINE-EPINEPHRINE (PF) 0.25% -1:200000 IJ SOLN
INTRAMUSCULAR | Status: DC | PRN
  Administered 2022-03-17: 30 mL

## 2022-03-17 MED ORDER — SODIUM CHLORIDE 0.9 % IV SOLN
2.0000 g | Freq: Once | INTRAVENOUS | Status: AC
  Administered 2022-03-17: 2 g via INTRAVENOUS

## 2022-03-17 MED ORDER — LOSARTAN POTASSIUM 50 MG PO TABS
100.0000 mg | ORAL_TABLET | Freq: Every day | ORAL | Status: DC
Start: 2022-03-18 — End: 2022-03-18
  Administered 2022-03-18: 100 mg via ORAL
  Filled 2022-03-17: qty 2

## 2022-03-17 MED ORDER — CLONIDINE HCL 0.2 MG PO TABS
0.2000 mg | ORAL_TABLET | Freq: Two times a day (BID) | ORAL | Status: DC
  Administered 2022-03-17 – 2022-03-21 (×8): 0.2 mg via ORAL
  Filled 2022-03-17 (×8): qty 1

## 2022-03-17 MED ORDER — HYDRALAZINE HCL 20 MG/ML IJ SOLN
10.0000 mg | INTRAMUSCULAR | Status: DC | PRN
  Administered 2022-03-18: 10 mg via INTRAVENOUS
  Filled 2022-03-17: qty 1

## 2022-03-17 MED ORDER — SODIUM CHLORIDE 0.9 % IV SOLN
INTRAVENOUS | Status: AC
  Filled 2022-03-17: qty 20

## 2022-03-17 MED ORDER — ACETAMINOPHEN 500 MG PO TABS
1000.0000 mg | ORAL_TABLET | Freq: Four times a day (QID) | ORAL | Status: DC
  Filled 2022-03-17 (×3): qty 2

## 2022-03-17 MED ORDER — ROCURONIUM BROMIDE 10 MG/ML (PF) SYRINGE
PREFILLED_SYRINGE | INTRAVENOUS | Status: DC | PRN
  Administered 2022-03-17: 100 mg via INTRAVENOUS

## 2022-03-17 MED ORDER — ONDANSETRON HCL 4 MG PO TABS: 4.0000 mg | ORAL_TABLET | Freq: Four times a day (QID) | ORAL | Status: DC | PRN

## 2022-03-17 MED ORDER — ONDANSETRON HCL 4 MG/2ML IJ SOLN
INTRAMUSCULAR | Status: AC
  Filled 2022-03-17: qty 6

## 2022-03-17 MED ORDER — LACTATED RINGERS IV SOLN: INTRAVENOUS | Status: DC

## 2022-03-17 MED ORDER — PHENYLEPHRINE HCL (PRESSORS) 10 MG/ML IV SOLN
INTRAVENOUS | Status: AC
  Filled 2022-03-17: qty 1

## 2022-03-17 MED ORDER — PHENYLEPHRINE HCL-NACL 20-0.9 MG/250ML-% IV SOLN
INTRAVENOUS | Status: DC | PRN
  Administered 2022-03-17: 30 ug/min via INTRAVENOUS

## 2022-03-17 MED ORDER — LIDOCAINE 2% (20 MG/ML) 5 ML SYRINGE
INTRAMUSCULAR | Status: DC | PRN
  Administered 2022-03-17: 60 mg via INTRAVENOUS

## 2022-03-17 MED ORDER — LIDOCAINE HCL (PF) 2 % IJ SOLN
INTRAMUSCULAR | Status: AC
  Filled 2022-03-17: qty 25

## 2022-03-17 MED ORDER — ONDANSETRON HCL 4 MG/2ML IJ SOLN
INTRAMUSCULAR | Status: DC | PRN
  Administered 2022-03-17: 4 mg via INTRAVENOUS

## 2022-03-17 MED ORDER — LACTATED RINGERS IR SOLN
Status: DC | PRN
  Administered 2022-03-17: 1000 mL

## 2022-03-17 MED ORDER — FOLIC ACID 1 MG PO TABS
1.0000 mg | ORAL_TABLET | Freq: Every day | ORAL | Status: DC
  Administered 2022-03-18 – 2022-03-21 (×4): 1 mg via ORAL
  Filled 2022-03-17 (×4): qty 1

## 2022-03-17 MED ORDER — CLONIDINE HCL 0.1 MG PO TABS
0.2000 mg | ORAL_TABLET | Freq: Once | ORAL | Status: AC
  Administered 2022-03-17: 0.2 mg via ORAL
  Filled 2022-03-17: qty 2

## 2022-03-17 MED ORDER — LORATADINE 10 MG PO TABS
10.0000 mg | ORAL_TABLET | Freq: Every day | ORAL | Status: DC
  Administered 2022-03-17 – 2022-03-20 (×4): 10 mg via ORAL
  Filled 2022-03-17 (×4): qty 1

## 2022-03-17 MED ORDER — DIPHENHYDRAMINE HCL 12.5 MG/5ML PO ELIX: 12.5000 mg | ORAL_SOLUTION | Freq: Four times a day (QID) | ORAL | Status: DC | PRN

## 2022-03-17 MED ORDER — METOPROLOL SUCCINATE ER 25 MG PO TB24
25.0000 mg | ORAL_TABLET | Freq: Every day | ORAL | Status: DC
  Administered 2022-03-18 – 2022-03-21 (×4): 25 mg via ORAL
  Filled 2022-03-17 (×4): qty 1

## 2022-03-17 MED ORDER — PROPOFOL 10 MG/ML IV BOLUS
INTRAVENOUS | Status: DC | PRN
  Administered 2022-03-17: 120 mg via INTRAVENOUS

## 2022-03-17 MED ORDER — METRONIDAZOLE 500 MG/100ML IV SOLN
500.0000 mg | Freq: Once | INTRAVENOUS | Status: AC
Start: 2022-03-17 — End: 2022-03-17
  Administered 2022-03-17: 500 mg via INTRAVENOUS
  Filled 2022-03-17: qty 100

## 2022-03-17 MED ORDER — FENTANYL CITRATE (PF) 100 MCG/2ML IJ SOLN
INTRAMUSCULAR | Status: DC | PRN
  Administered 2022-03-17 (×4): 50 ug via INTRAVENOUS

## 2022-03-17 MED ORDER — PROPOFOL 10 MG/ML IV BOLUS
INTRAVENOUS | Status: AC
  Filled 2022-03-17: qty 20

## 2022-03-17 MED ORDER — HYDRALAZINE HCL 20 MG/ML IJ SOLN
INTRAMUSCULAR | Status: AC
  Administered 2022-03-17: 5 mg via INTRAVENOUS
  Filled 2022-03-17: qty 1

## 2022-03-17 MED ORDER — SUGAMMADEX SODIUM 500 MG/5ML IV SOLN
INTRAVENOUS | Status: DC | PRN
  Administered 2022-03-17: 500 mg via INTRAVENOUS

## 2022-03-17 MED ORDER — HYDRALAZINE HCL 20 MG/ML IJ SOLN
10.0000 mg | Freq: Once | INTRAMUSCULAR | Status: AC
  Administered 2022-03-17: 10 mg via INTRAVENOUS
  Filled 2022-03-17: qty 1

## 2022-03-17 MED ORDER — PHENYLEPHRINE 80 MCG/ML (10ML) SYRINGE FOR IV PUSH (FOR BLOOD PRESSURE SUPPORT)
PREFILLED_SYRINGE | INTRAVENOUS | Status: AC
  Filled 2022-03-17: qty 40

## 2022-03-17 MED ORDER — TRAMADOL HCL 50 MG PO TABS
50.0000 mg | ORAL_TABLET | Freq: Four times a day (QID) | ORAL | Status: DC | PRN
  Administered 2022-03-18 – 2022-03-21 (×5): 50 mg via ORAL
  Filled 2022-03-17 (×5): qty 1

## 2022-03-17 MED ORDER — SODIUM CHLORIDE 0.9 % IR SOLN
Status: DC | PRN
  Administered 2022-03-17: 1000 mL

## 2022-03-17 MED ORDER — TERBINAFINE HCL 250 MG PO TABS
250.0000 mg | ORAL_TABLET | Freq: Every day | ORAL | Status: DC
  Administered 2022-03-18 – 2022-03-21 (×4): 250 mg via ORAL
  Filled 2022-03-17 (×4): qty 1

## 2022-03-17 MED ORDER — LACTATED RINGERS IV SOLN: INTRAVENOUS | Status: DC | PRN

## 2022-03-17 MED ORDER — HYDRALAZINE HCL 20 MG/ML IJ SOLN
INTRAMUSCULAR | Status: DC | PRN
  Administered 2022-03-17 (×2): 5 mg via INTRAVENOUS

## 2022-03-17 MED ORDER — PHENYLEPHRINE 80 MCG/ML (10ML) SYRINGE FOR IV PUSH (FOR BLOOD PRESSURE SUPPORT)
PREFILLED_SYRINGE | INTRAVENOUS | Status: DC | PRN
  Administered 2022-03-17 (×2): 160 ug via INTRAVENOUS

## 2022-03-17 MED ORDER — SIMETHICONE 80 MG PO CHEW
40.0000 mg | CHEWABLE_TABLET | Freq: Four times a day (QID) | ORAL | Status: DC | PRN
  Administered 2022-03-20: 40 mg via ORAL
  Filled 2022-03-17: qty 1

## 2022-03-17 MED ORDER — HYDROMORPHONE HCL 1 MG/ML IJ SOLN
0.5000 mg | INTRAMUSCULAR | Status: DC | PRN
  Administered 2022-03-18: 0.5 mg via INTRAVENOUS
  Filled 2022-03-17: qty 0.5

## 2022-03-17 MED ORDER — ALBUMIN HUMAN 5 % IV SOLN
INTRAVENOUS | Status: AC
  Filled 2022-03-17: qty 250

## 2022-03-17 MED ORDER — AMLODIPINE BESYLATE 5 MG PO TABS
5.0000 mg | ORAL_TABLET | Freq: Every day | ORAL | Status: DC
  Administered 2022-03-18 – 2022-03-21 (×4): 5 mg via ORAL
  Filled 2022-03-17 (×4): qty 1

## 2022-03-17 MED ORDER — IOHEXOL 300 MG/ML  SOLN
100.0000 mL | Freq: Once | INTRAMUSCULAR | Status: AC | PRN
Start: 2022-03-17 — End: 2022-03-17
  Administered 2022-03-17: 100 mL via INTRAVENOUS

## 2022-03-17 MED ORDER — ROSUVASTATIN CALCIUM 20 MG PO TABS
20.0000 mg | ORAL_TABLET | Freq: Every day | ORAL | Status: DC
  Administered 2022-03-18 – 2022-03-21 (×4): 20 mg via ORAL
  Filled 2022-03-17 (×4): qty 1

## 2022-03-17 MED ORDER — EPHEDRINE 5 MG/ML INJ
INTRAVENOUS | Status: AC
  Filled 2022-03-17: qty 10

## 2022-03-17 MED ORDER — FENTANYL CITRATE PF 50 MCG/ML IJ SOSY
25.0000 ug | PREFILLED_SYRINGE | INTRAMUSCULAR | Status: DC | PRN
  Administered 2022-03-17: 50 ug via INTRAVENOUS

## 2022-03-17 MED ORDER — SUCCINYLCHOLINE CHLORIDE 200 MG/10ML IV SOSY
PREFILLED_SYRINGE | INTRAVENOUS | Status: AC
  Filled 2022-03-17: qty 10

## 2022-03-17 MED ORDER — LACTATED RINGERS IV BOLUS
1000.0000 mL | Freq: Once | INTRAVENOUS | Status: AC
  Administered 2022-03-17: 1000 mL via INTRAVENOUS

## 2022-03-17 SURGICAL SUPPLY — 68 items
APPLIER CLIP 5 13 M/L LIGAMAX5 (MISCELLANEOUS)
APPLIER CLIP ROT 10 11.4 M/L (STAPLE)
BAG COUNTER SPONGE SURGICOUNT (BAG) ×2 IMPLANT
BAG RETRIEVAL 10 (BASKET) ×1
BLADE SURG SZ10 CARB STEEL (BLADE) ×1 IMPLANT
CABLE HIGH FREQUENCY MONO STRZ (ELECTRODE) IMPLANT
CHLORAPREP W/TINT 26 (MISCELLANEOUS) ×2 IMPLANT
CLIP APPLIE 5 13 M/L LIGAMAX5 (MISCELLANEOUS) IMPLANT
CLIP APPLIE ROT 10 11.4 M/L (STAPLE) IMPLANT
COVER SURGICAL LIGHT HANDLE (MISCELLANEOUS) ×2 IMPLANT
CUTTER FLEX LINEAR 45M (STAPLE) ×2 IMPLANT
DERMABOND ADVANCED (GAUZE/BANDAGES/DRESSINGS) ×1
DERMABOND ADVANCED .7 DNX12 (GAUZE/BANDAGES/DRESSINGS) ×1 IMPLANT
DRAIN CHANNEL 19F RND (DRAIN) IMPLANT
DRSG OPSITE POSTOP 4X8 (GAUZE/BANDAGES/DRESSINGS) ×1 IMPLANT
DRSG TEGADERM 2-3/8X2-3/4 SM (GAUZE/BANDAGES/DRESSINGS) ×2 IMPLANT
ELECT PENCIL ROCKER SW 15FT (MISCELLANEOUS) ×2 IMPLANT
ELECT REM PT RETURN 15FT ADLT (MISCELLANEOUS) ×2 IMPLANT
ENDOLOOP SUT PDS II  0 18 (SUTURE)
ENDOLOOP SUT PDS II 0 18 (SUTURE) IMPLANT
EVACUATOR SILICONE 100CC (DRAIN) IMPLANT
GAUZE SPONGE 2X2 8PLY STRL LF (GAUZE/BANDAGES/DRESSINGS) IMPLANT
GLOVE BIO SURGEON STRL SZ7.5 (GLOVE) ×4 IMPLANT
GLOVE BIOGEL PI IND STRL 7.0 (GLOVE) IMPLANT
GLOVE BIOGEL PI INDICATOR 7.0 (GLOVE) ×2
GLOVE INDICATOR 8.0 STRL GRN (GLOVE) ×3 IMPLANT
GOWN STRL REUS W/ TWL XL LVL3 (GOWN DISPOSABLE) ×2 IMPLANT
GOWN STRL REUS W/TWL XL LVL3 (GOWN DISPOSABLE) ×6
HANDLE SUCTION POOLE (INSTRUMENTS) IMPLANT
IRRIG SUCT STRYKERFLOW 2 WTIP (MISCELLANEOUS) ×2
IRRIGATION SUCT STRKRFLW 2 WTP (MISCELLANEOUS) ×1 IMPLANT
KIT BASIN OR (CUSTOM PROCEDURE TRAY) ×2 IMPLANT
KIT TURNOVER KIT A (KITS) IMPLANT
PAD POSITIONING PINK XL (MISCELLANEOUS) ×2 IMPLANT
RELOAD 45 VASCULAR/THIN (ENDOMECHANICALS) IMPLANT
RELOAD PROXIMATE 75MM BLUE (ENDOMECHANICALS) ×4 IMPLANT
RELOAD STAPLE 45 2.5 WHT GRN (ENDOMECHANICALS) IMPLANT
RELOAD STAPLE 45 3.5 BLU ETS (ENDOMECHANICALS) IMPLANT
RELOAD STAPLE 75 3.8 BLU REG (ENDOMECHANICALS) IMPLANT
RELOAD STAPLE TA45 3.5 REG BLU (ENDOMECHANICALS) IMPLANT
RETRACTOR WND ALEXIS 25 LRG (MISCELLANEOUS) IMPLANT
RTRCTR WOUND ALEXIS 25CM LRG (MISCELLANEOUS) ×2
SCISSORS LAP 5X35 DISP (ENDOMECHANICALS) IMPLANT
SEALER TISSUE G2 STRG ARTC 35C (ENDOMECHANICALS) ×1 IMPLANT
SET TUBE SMOKE EVAC HIGH FLOW (TUBING) ×2 IMPLANT
SHEARS HARMONIC ACE PLUS 36CM (ENDOMECHANICALS) ×2 IMPLANT
SLEEVE ADV FIXATION 5X100MM (TROCAR) ×2 IMPLANT
SPIKE FLUID TRANSFER (MISCELLANEOUS) ×2 IMPLANT
SPONGE GAUZE 2X2 STER 10/PKG (GAUZE/BANDAGES/DRESSINGS) ×1
STAPLER 90 3.5 STAND SLIM (STAPLE) ×2
STAPLER 90 3.5 STD SLIM (STAPLE) IMPLANT
STAPLER PROXIMATE 75MM BLUE (STAPLE) ×1 IMPLANT
STAPLER VISISTAT (STAPLE) ×1 IMPLANT
SUCTION POOLE HANDLE (INSTRUMENTS) ×2
SUT ETHILON 3 0 PS 1 (SUTURE) IMPLANT
SUT MNCRL AB 4-0 PS2 18 (SUTURE) ×2 IMPLANT
SUT PDS AB 1 CT1 27 (SUTURE) ×2 IMPLANT
SUT SILK 3 0 SH CR/8 (SUTURE) ×1 IMPLANT
SYS BAG RETRIEVAL 10MM (BASKET) ×1
SYSTEM BAG RETRIEVAL 10MM (BASKET) ×1 IMPLANT
TOWEL OR 17X26 10 PK STRL BLUE (TOWEL DISPOSABLE) IMPLANT
TOWEL OR NON WOVEN STRL DISP B (DISPOSABLE) ×2 IMPLANT
TRAY FOLEY MTR SLVR 14FR STAT (SET/KITS/TRAYS/PACK) ×2 IMPLANT
TRAY FOLEY MTR SLVR 16FR STAT (SET/KITS/TRAYS/PACK) ×2 IMPLANT
TRAY LAPAROSCOPIC (CUSTOM PROCEDURE TRAY) ×2 IMPLANT
TROCAR ADV FIXATION 5X100MM (TROCAR) ×2 IMPLANT
TROCAR BALLN 12MMX100 BLUNT (TROCAR) ×2 IMPLANT
YANKAUER SUCT BULB TIP 10FT TU (MISCELLANEOUS) ×1 IMPLANT

## 2022-03-17 NOTE — Op Note (Signed)
PATIENT: Megan Tate  76 y.o. female  Patient Care Team: Pcp, No as PCP - General  PREOP DIAGNOSIS: Acute appendicitis  POSTOP DIAGNOSIS: Appendiceal mass with associated appendicitis  PROCEDURE: Laparoscopic right hemicolectomy  SURGEON: Sharon Mt. Philomena Buttermore, MD  ASSISTANT: OR staff  ANESTHESIA: General endotracheal  EBL: 30 mL Total I/O In: 100 [IV Piggyback:100] Out: 53 [Urine:250; Blood:30]  DRAINS: None  SPECIMEN: Right colon - including terminal ileum, appendix, cecum, ascending colon and proximal transverse colon  COUNTS: Sponge, needle and instrument counts were reported correct x2  FINDINGS:  Acutely inflamed appearing suppurative appendix with apparent masslike thickening emanating from the appendix and involving the neighboring cecum.  This appears much more consistent with an appendiceal versus cecal tumor as the primary cause with a secondary appendicitis.  It would not have been technically feasible to carry out an appendectomy given the quality of the tissue with evident mass.  Therefore, a laparoscopic right hemicolectomy was carried out including the right branch of the middle colic vessels.  The omentum in the abdomen is normal in appearance.  The peritoneal surfaces are normal in appearance.  Limited visualization of the liver demonstrates a normal liver surface.  Normal gallbladder.   NARRATIVE:  The patient was identified & brought into the operating room, placed supine on the operating table and SCDs were applied to the lower extremities. General endotracheal anesthesia was induced. The patient was positioned supine with her left arm tucked. Antibiotics were administered. A foley catheter was placed under sterile conditions. Hair in the region of planned surgery was clipped. The abdomen was prepped and draped in a sterile fashion. A timeout was performed confirming our patient and plan.   Local anesthetic was infiltrated at the infraumbilical location.  A  skin incision was created.  The umbilical stalk was grasped and retracted outwardly.  The infraumbilical fascia is then incised.  The peritoneum was cautiously entered bluntly.  A 0 Vicryl stay stitch is placed.  A 12 mm Hassan trocar is placed.  Pneumoperitoneum was established to 15 mmHg using CO2.  The abdomen is surveyed.  There is an inflamed appearing appendix that appears stuck to her colon.  2 additional 5 mm trocars were placed in the left hemiabdomen under direct visualization.  The abdomen is surveyed.  The omentum is normal in appearance.  The liver surfaces are normal in appearance.  She was positioned in trendelenburg with left side down.  The appendix is identified in the right lower quadrant.  It is densely adherent to the associated cecum and there is firm/hard tissue involving the appendix, appendiceal mesentery, and cecum.  It is at this point clear that an appendectomy would not be technically feasible given the masslike thickening and additionally likely, oncologically inadequate.  Therefore, we opted to proceed with a right hemicolectomy.  This mass involves the base of the appendix and cecum.  The ileocolic pedicle was identified. Gentle blunt dissection commenced around the pedicle and the duodenum was identified and freed from the surrounding structures. I then separated the structures bluntly and used the Enseal device to ligate and divide the ileocolic pedicle.  I developed the retroperitoneal plane bluntly. I mobilized the terminal ileum, taking care to avoid injuring any retroperitoneal structures.  After this we began to mobilize laterally along the Justine Dines line of [place] and then took down the hepatic flexure using the Enseal device. I mobilized the omentum off of the right transverse colon. The entire colon was then flipped medially and mobilized off of  the retroperitoneal structures until I could visualize the lateral edge of the duodenum underneath.  I gently freed the duodenal  attachments.  The hepatic flexure has been fully mobilized as well.  At this point, the abdomen was desufflated.  Our 12 mm Hassan port is removed.  A periumbilical incision was then created.  An [place] wound protector was placed. The terminal ileum and right colon delivered through the wound protector. The terminal ileum was then transected using a GIA blue load stapler after creating a window in the mesentery. The remaining mesentery was divided using the Enseal device. The divided mesentery was inspected and noted to be hemostatic. The distal point of transection was then identified on the transverse colon at a location that included the right branch of the middle colics, leaving the main middle colic feeding the remaining transverse colon. This was transected using another blue load GIA stapler.  The specimen was then passed off.  The abdomen is inspected and hemostasis is noted.  Attention was turned to creating the anastomosis. The terminal ileum and transverse colon were inspected for orientation to ensure no twisting nor bowel included in the mesenteric defect. An anastomosis was created between the terminal ileum and the transverse colon using a 75 mm GIA blue load stapler. The staple line was inspected and noted to be hemostatic.  The common enterotomy channel was closed using a TA 90 blue load stapler. Hemostasis was achieved at the staple line using 3-0 silk U-stitches. 3-0 silk sutures were used to imbricate the corners of the staple line as well.  A 2-0 silk suture was placed securing the "apex" of the anastomosis. The anastomosis was palpated and noted to be widely patent. This was then placed back into the abdomen. The abdomen was then irrigated with sterile saline and hemostasis verified. The omentum was then brought down over the anastomosis. The wound protector cap was replaced and CO2 reinsufflated. The laparoscopic ports were removed under direct visualization and the sites noted to be  hemostatic. The [place] wound protector was removed, counts were reported correct, and we switched to new instruments/gown/gloves and additional sterile drapes placed around the field  The fascia was then closed using two running #1 PDS sutures.  All sponge, needle, and instrument counts were reported correct.  The skin of all incision sites was closed with a skin stapler.  Band-Aids were placed over the port sites.  A honeycomb was placed over the extraction site.   She was then awakened from anesthesia and sent to the post anesthesia care unit in stable condition.   DISPOSITION: PACU in satisfactory condition

## 2022-03-17 NOTE — Anesthesia Procedure Notes (Signed)
Date/Time: 03/17/2022 8:48 PM  Performed by: Cynda Familia, CRNAOxygen Delivery Method: Simple face mask Placement Confirmation: positive ETCO2 and breath sounds checked- equal and bilateral Dental Injury: Teeth and Oropharynx as per pre-operative assessment

## 2022-03-17 NOTE — ED Provider Notes (Signed)
[facility] Provider Note   CSN: [id number] Arrival date & time: 03/17/22  0920     History  Chief Complaint  Patient presents with   Abdominal Pain    Megan Tate is a 76 y.o. 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  Patient notes pain comes and goes and feels as if "she needs to pass gas but is unable to".  She had a normal bowel movement the day before yesterday.  She notes it is normal for her to go 2 days without bowel movements.  No diarrhea.  No previous history of similar symptoms, no abdominal surgeries.  She denies fevers, chills, cough, congestion, chest pain, shortness of breath, nausea, vomiting.  No treatment prior to arrival.  She does not drink alcohol.   Abdominal Pain      Home Medications Prior to Admission medications   Medication Sig Start Date End Date Taking? Authorizing Provider  amLODipine (NORVASC) 5 MG tablet TAKE 1 TABLET BY MOUTH EVERY DAY 03/27/21   Luetta Nutting, DO  baclofen (LIORESAL) 10 MG tablet Take 1 tablet (10 mg total) by mouth 3 (three) times daily as needed for muscle spasms. 03/02/21   Luetta Nutting, DO  cloNIDine (CATAPRES) 0.2 MG tablet Take 1 tablet (0.2 mg total) by mouth 2 (two) times daily. 06/12/21   Luetta Nutting, DO  Continuous Blood Gluc Receiver (DEXCOM G6 RECEIVER) DEVI Use to check glucose as needed.  Diagnosis: E11.65 02/15/21   Luetta Nutting, DO  Continuous Blood Gluc Sensor (DEXCOM G6 SENSOR) MISC Use to check glucose as needed.  Replace every 10 days. Dx: E11.65 02/15/21   Luetta Nutting, DO  Continuous Blood Gluc Transmit (DEXCOM G6 TRANSMITTER) MISC Use to check glucose as needed.  Replace every 3 months.  Dx: E11.65 02/15/21   Luetta Nutting, DO  diclofenac Sodium (VOLTAREN) 1 % GEL Apply 4 g topically 4 (four) times daily. 04/19/21   Malvin Johns, MD  dorzolamide-timolol (COSOPT) 22.3-6.8 MG/ML  ophthalmic solution Place 1 drop into both eyes 2 (two) times daily.  03/18/14   [provider]  folic acid (FOLVITE) 1 MG tablet TAKE 2 TABLETS BY MOUTH EVERY DAY 12/27/20   Volanda Napoleon, MD  glucose blood (ONE TOUCH ULTRA TEST) test strip USE TO TEST BLOOD SUGAR BEFORE MEALS AND AT BEDTIME 09/30/17   Volanda Napoleon, MD  hydrALAZINE (APRESOLINE) 25 MG tablet Take 12.'5mg'$  TID x2 weeks then increast to '25mg'$  TID 04/25/21   Luetta Nutting, DO  Insulin Pen Needle (BD PEN NEEDLE NANO 2ND GEN) 32G X 4 MM MISC Dx DM E11.8 Inject insulin twice daily. 03/29/21   Zigmund Daniel, Cody, DO  LEVEMIR FLEXTOUCH 100 UNIT/ML FlexTouch Pen INJECT 25 UNITS INTO THE SKIN DAILY. 08/21/21   Luetta Nutting, DO  lidocaine (LIDODERM) 5 % Place 1 patch onto the skin daily. Remove & Discard patch within 12 hours or as directed by MD 04/19/21   Malvin Johns, MD  lidocaine-prilocaine (EMLA) cream APPLY TO PORT-A-CATH SITE 1 HOUR PRIOR TO TREATMENT 04/24/21   Volanda Napoleon, MD  LINZESS 145 MCG CAPS capsule TAKE 1 CAPSULE BY MOUTH EVERY DAY BEFORE BREAKFAST 11/27/21   Luetta Nutting, DO  losartan (COZAAR) 100 MG tablet TAKE 1 TABLET BY MOUTH EVERY DAY 03/16/22   Luetta Nutting, DO  meclizine (ANTIVERT) 25 MG tablet Take 1 tablet (25 mg total) by mouth 3 (  three) times daily as needed for dizziness. 07/28/18   Libby Maw, MD  rosuvastatin (CRESTOR) 20 MG tablet TAKE 1 TABLET BY MOUTH EVERY DAY 06/01/20   Luetta Nutting, DO  SitaGLIPtin-MetFORMIN HCl (JANUMET XR) 50-1000 MG TB24 Take 2 tablets by mouth daily. 11/21/21   Luetta Nutting, DO  spironolactone (ALDACTONE) 50 MG tablet TAKE 1 TABLET BY MOUTH EVERY DAY 11/20/21   Volanda Napoleon, MD      Allergies    Tylenol [acetaminophen]    Review of Systems   Review of Systems  Gastrointestinal:  Positive for abdominal pain.    Physical Exam Updated Vital Signs BP (!) 191/91   Pulse (!) 58   Temp 98.6 F (37 C) (Oral)   Resp 16   Ht '5\' 4"'$  (1.626 m)   Wt  90.7 kg   SpO2 99%   BMI 34.33 kg/m  Physical Exam Vitals and nursing note reviewed.  Constitutional:      General: She is not in acute distress.    Appearance: She is obese. She is not ill-appearing.  HENT:     Head: Atraumatic.  Eyes:     Conjunctiva/sclera: Conjunctivae normal.  Cardiovascular:     Rate and Rhythm: Normal rate and regular rhythm.     Pulses: Normal pulses.     Heart sounds: No murmur heard. Pulmonary:     Effort: Pulmonary effort is normal. No respiratory distress.     Breath sounds: Normal breath sounds.  Abdominal:     General: Abdomen is flat. There is no distension.     Palpations: Abdomen is soft.     Tenderness: There is abdominal tenderness in the right lower quadrant. There is no right CVA tenderness or left CVA tenderness.     Comments: RLQ tenderness, equivocal McBurney.  Abdomen is soft, rounded  Musculoskeletal:        General: Normal range of motion.     Cervical back: Normal range of motion.  Skin:    General: Skin is warm and dry.     Capillary Refill: Capillary refill takes less than 2 seconds.  Neurological:     General: No focal deficit present.     Mental Status: She is alert.  Psychiatric:        Mood and Affect: Mood normal.     ED Results / Procedures / Treatments   Labs (all labs ordered are listed, but only abnormal results are displayed) Labs Reviewed  COMPREHENSIVE METABOLIC PANEL  CBC WITH DIFFERENTIAL/PLATELET  URINALYSIS, ROUTINE W REFLEX MICROSCOPIC  LIPASE, BLOOD    EKG None  Radiology No results found.  Procedures Procedures    Medications Ordered in ED Medications  lactated ringers bolus 1,000 mL (has no administration in time range)    ED Course/ Medical Decision Making/ A&P                           Medical Decision Making Amount and/or Complexity of Data Reviewed Labs: ordered. Radiology: ordered.  Risk Prescription drug management.   Social determinants of health:  Social History    Socioeconomic History   Marital status: Single    Spouse name: Not on file   Number of children: Not on file   Years of education: Not on file   Highest education level: Not on file  Occupational History   Not on file  Tobacco Use   Smoking status: Never   Smokeless tobacco: Never   Tobacco  comments:    never used tobacco  Vaping Use   Vaping Use: Never used  Substance and Sexual Activity   Alcohol use: Never    Alcohol/week: 0.0 standard drinks of alcohol   Drug use: Never   Sexual activity: Not on file  Other Topics Concern   Not on file  Social History Narrative   Not on file   Social Determinants of Health   Financial Resource Strain: Not on file  Food Insecurity: Not on file  Transportation Needs: Not on file  Physical Activity: Not on file  Stress: Not on file  Social Connections: Not on file  Intimate Partner Violence: Not on file     Initial impression:  This patient presents to the ED for concern of right lower quadrant pain x2 days, this involves an extensive number of treatment options, and is a complaint that carries with it a high risk of complications and morbidity.   Differentials include appendicitis, intestinal ischemia, gastroenteritis, UTI diverticulitis, SBO.   Comorbidities affecting care:  Type 2 diabetes  Additional history obtained: None  Lab Tests  I Ordered, reviewed, and interpreted labs and EKG.  The pertinent results include:  No leukocytosis Creatinine at patient's baseline, around 1.20. Negative lipase UA without strong evidence of infection  Imaging Studies ordered:  I ordered imaging studies including  CT abdomen shows acute appendicitis without perforation or abscess I independently visualized and interpreted imaging and I agree with the radiologist interpretation.    Medicines ordered and prescription drug management:  I ordered medication including: 1 L LR bolus Reevaluation of the patient after these medicines  showed that the patient stayed the same I have reviewed the patients home medicines and have made adjustments as needed   Consultations Obtained:  I requested consultation with general surgery and spoke with Dr. Dema Severin,  and discussed lab and imaging findings as well as pertinent plan - they recommend: Patient to be transferred over to the [facility] long emergency department, either POV or ED to ED transfer at which time he will evaluate her.   ED Course/Re-evaluation: 76 year old female presents to the emergency department for right lower quadrant pain x2 days.  Patient is overall well-appearing and nontoxic although she is focal right lower quadrant tenderness.  She is hypertensive to about the 650P systolic here in the emergency department today.  She states that she took her blood pressure medications this morning as directed but she usually runs in the 546 systolic regularly.  Labs without leukocytosis or acute findings.  CT abdomen shows acute appendicitis without complications.  General surgery to see patient after transfer to [facility] ED.  Patient wishes to drive by private vehicle.  Dr. Eulis Foster agrees to accept patient at [facility] long.  Disposition:  After consideration of the diagnostic results, physical exam, history and the patients response to treatment feel that the patent would benefit from transfer to [facility] ED.   Acute appendicitis: Plan and management as described above.   Final Clinical Impression(s) / ED Diagnoses Final diagnoses:  None    Rx / DC Orders ED Discharge Orders     None         Tonye Pearson, PA-C 03/17/22 1414    Gareth Morgan, MD 03/18/22 2143

## 2022-03-17 NOTE — Transfer of Care (Signed)
Immediate Anesthesia Transfer of Care Note  Patient: Megan Tate  Procedure(s) Performed: APPENDECTOMY LAPAROSCOPIC  Patient Location: PACU  Anesthesia Type:General  Level of Consciousness: awake and alert   Airway & Oxygen Therapy: Patient Spontanous Breathing and Patient connected to face mask oxygen  Post-op Assessment: Report given to RN and Post -op Vital signs reviewed and stable  Post vital signs: Reviewed and stable  Last Vitals:  Vitals Value Taken Time  BP    Temp    Pulse    Resp    SpO2      Last Pain:  Vitals:   03/17/22 1410  TempSrc:   PainSc: 4          Complications: No notable events documented.

## 2022-03-17 NOTE — H&P (Signed)
History and Physical    Patient: Megan Tate VZC:[id number] DOB: 19-Jan-1946 DOA: 03/17/2022 DOS: the patient was seen and examined on 03/17/2022 PCP: Pcp, No  Patient coming from: Home  Chief Complaint:  Chief Complaint  Patient presents with   Abdominal Pain   HPI: Megan Tate is a 76 y.o. female with medical history significant of CLL, type II DM, hypertension, class I obesity, glaucoma, hyperlipidemia, gout who is coming to the emergency department with complaints of RUQ abdominal pain with multiple episodes of nausea and emesis yesterday. No diarrhea or constipation. No melena or hematochezia. He denied fever, chills, rhinorrhea, sore throat, wheezing or hemoptysis. No chest pain, palpitations, diaphoresis, PND, orthopnea or pitting edema of the lower extremities. No flank pain, dysuria, frequency or hematuria.  No polyuria, polydipsia, polyphagia or blurred vision.   ED course: Initial vital signs were temperature 98.6 F, pulse 58, respirations 16, BP 1 191/91 mmHg, but increase as high as 241/97 mmHg at 1 point.  Her oxygen saturation has been ranging from 98 to 100% on room air.  She received 1000 mL of LR bolus, hydralazine 10 mg IVP x2, clonidine 0.2 mg IVP.  Lab work: Her urinalysis was cloudy with small leukocyte esterase and many bacteria.  CBC with a white count of 8.5, hemoglobin 11.8 g/dL and platelets 225.  Lipase was normal.  CMP showed a glucose of 204, BUN of 12, creatinine 1.20 and calcium 10.4 mg/dL.  Total protein was 6.1 and albumin 3.5 g/dL.  The rest of the electrolytes and LFTs were unremarkable.  Imaging: CT abdomen/pelvis with contrast showed acute appendicitis without evidence of sepsis or other complication.   Review of Systems: As mentioned in the history of present illness. All other systems reviewed and are negative. Past Medical History:  Diagnosis Date   Cancer ([place]) CLL   CLL (chronic lymphocytic leukemia) ([place]) 08/16/2014   Diabetes mellitus without  complication ([place])    Hypertension    No past surgical history on file. Social History:  reports that she has never smoked. She has never used smokeless tobacco. She reports that she does not drink alcohol and does not use drugs.  Allergies  Allergen Reactions   Tylenol [Acetaminophen] Other (See Comments)    "Messes with my stomach"    No family history on file.  Prior to Admission medications   Medication Sig Start Date End Date Taking? Authorizing Provider  amLODipine (NORVASC) 5 MG tablet TAKE 1 TABLET BY MOUTH EVERY DAY Patient taking differently: Take 5 mg by mouth daily. 03/27/21  Yes Luetta Nutting, DO  cloNIDine (CATAPRES) 0.2 MG tablet Take 1 tablet (0.2 mg total) by mouth 2 (two) times daily. 06/12/21  Yes Luetta Nutting, DO  dorzolamide-timolol (COSOPT) 22.3-6.8 MG/ML ophthalmic solution Place 1 drop into both eyes in the morning and at bedtime. 03/18/14  Yes [provider]  folic acid (FOLVITE) 1 MG tablet TAKE 2 TABLETS BY MOUTH EVERY DAY Patient taking differently: Take 1 mg by mouth daily. 12/27/20  Yes Volanda Napoleon, MD  hydrALAZINE (APRESOLINE) 25 MG tablet Take 12.'5mg'$  TID x2 weeks then increast to '25mg'$  TID Patient taking differently: Take 25 mg by mouth 2 (two) times daily. 04/25/21  Yes Matthews, Cody, DO  LEVEMIR FLEXTOUCH 100 UNIT/ML FlexTouch Pen INJECT 25 UNITS INTO THE SKIN DAILY. Patient taking differently: 25 Units daily before breakfast. 08/21/21  Yes Luetta Nutting, DO  levocetirizine (XYZAL) 5 MG tablet Take 5 mg by mouth at bedtime.   Yes [provider]  lidocaine-prilocaine (EMLA) cream APPLY TO PORT-A-CATH SITE 1 HOUR PRIOR TO TREATMENT Patient taking differently: Apply 1 Application topically See admin instructions. Apply to Port-A-Cath site 1 hour prior to treatment 04/24/21  Yes Ennever, Rudell Cobb, MD  LINZESS 145 MCG CAPS capsule TAKE 1 CAPSULE BY MOUTH EVERY DAY BEFORE BREAKFAST Patient taking differently: Take 145 mcg by mouth daily  before breakfast. 11/27/21  Yes Luetta Nutting, DO  losartan (COZAAR) 100 MG tablet TAKE 1 TABLET BY MOUTH EVERY DAY Patient taking differently: Take 100 mg by mouth in the morning. 03/16/22  Yes Luetta Nutting, DO  magnesium oxide (MAG-OX) 400 MG tablet Take 400 mg by mouth every other day. 01/15/22  Yes [provider]  metoprolol succinate (TOPROL-XL) 50 MG 24 hr tablet Take 25 mg by mouth in the morning. Take with or immediately following a meal.   Yes [provider]  rosuvastatin (CRESTOR) 20 MG tablet TAKE 1 TABLET BY MOUTH EVERY DAY Patient taking differently: Take 20 mg by mouth daily. 06/01/20  Yes Luetta Nutting, DO  SitaGLIPtin-MetFORMIN HCl (JANUMET XR) 50-1000 MG TB24 Take 2 tablets by mouth daily. Patient taking differently: Take 1 tablet by mouth 2 (two) times daily. 11/21/21  Yes Luetta Nutting, DO  terbinafine (LAMISIL) 250 MG tablet Take 250 mg by mouth daily.   Yes [provider]  baclofen (LIORESAL) 10 MG tablet Take 1 tablet (10 mg total) by mouth 3 (three) times daily as needed for muscle spasms. Patient not taking: Reported on 03/17/2022 03/02/21   Luetta Nutting, DO  Continuous Blood Gluc Receiver (DEXCOM G6 RECEIVER) DEVI Use to check glucose as needed.  Diagnosis: E11.65 02/15/21   Luetta Nutting, DO  Continuous Blood Gluc Sensor (DEXCOM G6 SENSOR) MISC Use to check glucose as needed.  Replace every 10 days. Dx: E11.65 Patient not taking: Reported on 03/17/2022 02/15/21   Luetta Nutting, DO  Continuous Blood Gluc Transmit (DEXCOM G6 TRANSMITTER) MISC Use to check glucose as needed.  Replace every 3 months.  Dx: E11.65 02/15/21   Luetta Nutting, DO  diclofenac Sodium (VOLTAREN) 1 % GEL Apply 4 g topically 4 (four) times daily. Patient not taking: Reported on 03/17/2022 04/19/21   Malvin Johns, MD  glucose blood (ONE TOUCH ULTRA TEST) test strip USE TO TEST BLOOD SUGAR BEFORE MEALS AND AT BEDTIME 09/30/17   Volanda Napoleon, MD  Insulin Pen Needle (BD PEN  NEEDLE NANO 2ND GEN) 32G X 4 MM MISC Dx DM E11.8 Inject insulin twice daily. 03/29/21   Luetta Nutting, DO  lidocaine (LIDODERM) 5 % Place 1 patch onto the skin daily. Remove & Discard patch within 12 hours or as directed by MD Patient not taking: Reported on 03/17/2022 04/19/21   Malvin Johns, MD  meclizine (ANTIVERT) 25 MG tablet Take 1 tablet (25 mg total) by mouth 3 (three) times daily as needed for dizziness. Patient not taking: Reported on 03/17/2022 07/28/18   Libby Maw, MD  spironolactone (ALDACTONE) 50 MG tablet TAKE 1 TABLET BY MOUTH EVERY DAY Patient not taking: Reported on 03/17/2022 11/20/21   Volanda Napoleon, MD    Physical Exam: Vitals:   03/17/22 1545 03/17/22 1605 03/17/22 1615 03/17/22 1700  BP: (!) 225/77 (!) 218/94 (!) 161/80 (!) 160/62  Pulse: (!) 54  69 64  Resp: '16  18 18  '$ Temp:      TempSrc:      SpO2: 99%  98% 99%  Weight:      Height:  Physical Exam Vitals and nursing note reviewed.  Constitutional:      Appearance: She is well-developed. She is obese.  HENT:     Head: Normocephalic.     Mouth/Throat:     Mouth: Mucous membranes are moist.  Eyes:     General: No scleral icterus.    Pupils: Pupils are equal, round, and reactive to light.  Cardiovascular:     Rate and Rhythm: Normal rate and regular rhythm.     Heart sounds: S1 normal and S2 normal.  Pulmonary:     Effort: Pulmonary effort is normal.     Breath sounds: Normal breath sounds.  Abdominal:     General: Bowel sounds are normal.     Palpations: Abdomen is soft.     Tenderness: There is abdominal tenderness in the suprapubic area. There is no right CVA tenderness, left CVA tenderness, guarding or rebound. Negative signs include McBurney's sign.  Genitourinary:    Adnexa: Right adnexa normal.       Right: No mass.         Left: No mass.    Musculoskeletal:     Cervical back: Neck supple.     Right lower leg: 2+ Edema present.     Left lower leg: 2+ Edema present.   Skin:    General: Skin is warm and dry.  Neurological:     General: No focal deficit present.     Mental Status: She is alert and oriented to person, place, and time.  Psychiatric:        Mood and Affect: Mood normal.        Behavior: Behavior normal.    Follow-up: Data Reviewed:  There are no new results to review at this time.  Assessment and Plan: Principal Problem:   Acute appendicitis Observation/PCU. Currently NPO. Undergoing appendectomy now. Diet orders per surgery. Analgesics as needed. Antiemetics as needed. Pantoprazole 40 mg IVP every 24 hours. Follow-up CBC and CMP in AM. Follow-up imaging in the morning. General surgery input appreciated.  Active Problems:   Benign essential hypertension With hypertensive urgency Significantly improved. Continue amlodipine, clonidine, hydralazine, losartan and metoprolol.    Type 2 diabetes mellitus without complication,    with long-term current use of insulin (HCC) Currently NPO. Continue Janumet Exar 2 tablets daily. Resume Levemir once back on diet. CBG monitoring before meals and bedtime.    Hypercalcemia Almost normal. Calcium level has been trending down.    Hyperlipidemia Continue rosuvastatin.    CLL (chronic lymphocytic leukemia) (HCC) Monitor CBC. Follow-up with oncology as scheduled.    Glaucoma Continue Cosopt drops. Follow-up with ophthalmology as outpatient.    Advance Care Planning:   Code Status: Full Code   Consults: General surgery.  Family Communication:   Severity of Illness: The appropriate patient status for this patient is INPATIENT. Inpatient status is judged to be reasonable and necessary in order to provide the required intensity of service to ensure the patient's safety. The patient's presenting symptoms, physical exam findings, and initial radiographic and laboratory data in the context of their chronic comorbidities is felt to place them at high risk for further clinical  deterioration. Furthermore, it is not anticipated that the patient will be medically stable for discharge from the hospital within 2 midnights of admission.   * I certify that at the point of admission it is my clinical judgment that the patient will require inpatient hospital care spanning beyond 2 midnights from the point of admission due to high  intensity of service, high risk for further deterioration and high frequency of surveillance required.*  Author: Reubin Milan, MD 03/17/2022 6:40 PM  For on call review [email or web address].   This document was prepared using Dragon voice recognition software and may contain some unintended transcription errors.

## 2022-03-17 NOTE — ED Triage Notes (Addendum)
Pt arrives pov, slow gait c/o RLQ pain x 2 days, denies fever

## 2022-03-17 NOTE — Anesthesia Preprocedure Evaluation (Addendum)
Anesthesia Evaluation  Patient identified by MRN, date of birth, ID band Patient awake    Reviewed: Allergy & Precautions, NPO status , Patient's Chart, lab work & pertinent test results, reviewed documented beta blocker date and time   Airway Mallampati: III  TM Distance: >3 FB Neck ROM: Full    Dental  (+) Edentulous Upper, Edentulous Lower, Dental Advisory Given   Pulmonary neg pulmonary ROS,    Pulmonary exam normal breath sounds clear to auscultation       Cardiovascular hypertension (poorly controlled hypertension on multiple meds), Pt. on medications and Pt. on home beta blockers Normal cardiovascular exam Rhythm:Regular Rate:Normal     Neuro/Psych negative neurological ROS  negative psych ROS   GI/Hepatic negative GI ROS, Neg liver ROS,   Endo/Other  diabetes, Type 2, Insulin Dependent  Renal/GU Renal InsufficiencyRenal diseaseLab Results      Component                Value               Date                      CREATININE               1.20 (H)            03/17/2022                BUN                      12                  03/17/2022                NA                       136                 03/17/2022                K                        3.6                 03/17/2022                CL                       104                 03/17/2022                CO2                      27                  03/17/2022             negative genitourinary   Musculoskeletal negative musculoskeletal ROS (+)   Abdominal   Peds  Hematology  (+) Blood dyscrasia (CLL), ,   Anesthesia Other Findings   Reproductive/Obstetrics                            Anesthesia Physical Anesthesia Plan  ASA: 3 and emergent  Anesthesia Plan: General   Post-op  Pain Management:    Induction: Intravenous and Rapid sequence  PONV Risk Score and Plan: 3 and Midazolam, Dexamethasone and Ondansetron  Airway  Management Planned: Oral ETT  Additional Equipment:   Intra-op Plan:   Post-operative Plan: Extubation in OR  Informed Consent: I have reviewed the patients History and Physical, chart, labs and discussed the procedure including the risks, benefits and alternatives for the proposed anesthesia with the patient or authorized representative who has indicated his/her understanding and acceptance.     Dental advisory given  Plan Discussed with: CRNA  Anesthesia Plan Comments:        Anesthesia [place] Evaluation

## 2022-03-17 NOTE — Discharge Instructions (Addendum)
CC[facility]      [facility] [phone]  OPEN ABDOMINAL SURGERY: POST OP INSTRUCTIONS  Always review your discharge instruction sheet given to you by the facility where your surgery was performed.  IF YOU HAVE DISABILITY OR FAMILY LEAVE FORMS, YOU MUST BRING THEM TO THE OFFICE FOR PROCESSING.  PLEASE DO NOT GIVE THEM TO YOUR DOCTOR.  A prescription for pain medication may be given to you upon discharge.  Take your pain medication as prescribed, if needed.  If narcotic pain medicine is not needed, then you may take acetaminophen (Tylenol) or ibuprofen (Advil) as needed. Take your usually prescribed medications unless otherwise directed. If you need a refill on your pain medication, please contact your pharmacy. They will contact our office to request authorization.  Prescriptions will not be filled after 5pm or on week-ends. You should follow a light diet the first few days after arrival home, such as soup and crackers, pudding, etc.unless your doctor has advised otherwise. A high-fiber, low fat diet can be resumed as tolerated.   Be sure to include lots of fluids daily. Most patients will experience some swelling and bruising on the chest and neck area.  Ice packs will help.  Swelling and bruising can take several days to resolve Most patients will experience some swelling and bruising in the area of the incision. Ice pack will help. Swelling and bruising can take several days to resolve..  It is common to experience some constipation if taking pain medication after surgery.  Increasing fluid intake and taking a stool softener will usually help or prevent this problem from occurring.  A mild laxative (Milk of Magnesia or Miralax) should be taken according to package directions if there are no bowel movements after 48 hours.  You may have steri-strips (small skin tapes) in place directly over the incision.  These strips should be left on the skin for 7-10 days.  If your surgeon used skin  glue on the incision, you may shower in 24 hours.  The glue will flake off over the next 2-3 weeks.  Any sutures or staples will be removed at the office during your follow-up visit. You may find that a light gauze bandage over your incision may keep your staples from being rubbed or pulled. You may shower and replace the bandage daily. ACTIVITIES:  You may resume regular (light) daily activities beginning the next day--such as daily self-care, walking, climbing stairs--gradually increasing activities as tolerated.  You may have sexual intercourse when it is comfortable.  Refrain from any heavy lifting or straining until approved by your doctor. You may drive when you no longer are taking prescription pain medication, you can comfortably wear a seatbelt, and you can safely maneuver your car and apply brakes Return to Work: ___________________________________ You should see your doctor in the office for a follow-up appointment approximately two weeks after your surgery.  Make sure that you call for this appointment within a day or two after you arrive home to insure a convenient appointment time. OTHER INSTRUCTIONS:  _____________________________________________________________ _____________________________________________________________  WHEN TO CALL YOUR DOCTOR: Fever over 101.0 Inability to urinate Nausea and/or vomiting Extreme swelling or bruising Continued bleeding from incision. Increased pain, redness, or drainage from the incision. Difficulty swallowing or breathing Muscle cramping or spasms. Numbness or tingling in hands or feet or around lips.  The clinic staff is available to answer your questions during regular business hours.  Please don't hesitate to call and ask to speak to one of   the nurses if you have concerns.  For further questions, please visit [email or web address]  

## 2022-03-17 NOTE — Consult Note (Addendum)
CC/Reason for consult: Acute appendicitis  HPI: Megan Tate is an 76 y.o. female with hx of DM, HTN, CLL -presented to the emergency department with a 2-day history of right lower quadrant abdominal pain.  This has been persistent.  She describes it as a more significant gas type pain or cramp.  She denies any nausea or vomiting.  She denies any history of having experienced this before.  She denies fever or chills.  The pain does not radiate.  Nothing seems to make it better or worse.  She presented to [facility] and underwent evaluation.  A CT scan demonstrated findings concerning for acute appendicitis.  She was transferred to [facility] for surgical evaluation  Her husband is at bedside  Past Medical History:  Diagnosis Date   Cancer ([place]) CLL   CLL (chronic lymphocytic leukemia) ([place]) 08/16/2014   Diabetes mellitus without complication ([place])    Hypertension     No past surgical history on file.  No family history on file.  Social:  reports that she has never smoked. She has never used smokeless tobacco. She reports that she does not drink alcohol and does not use drugs.  Allergies:  Allergies  Allergen Reactions   Tylenol [Acetaminophen] Other (See Comments)    Messes with stomach    Medications: I have reviewed the patient's current medications.  Results for orders placed or performed during the hospital encounter of 03/17/22 (from the past 48 hour(s))  Comprehensive metabolic panel     Status: Abnormal   Collection Time: 03/17/22  9:46 AM  Result Value Ref Range   Sodium 136 135 - 145 mmol/L   Potassium 3.6 3.5 - 5.1 mmol/L   Chloride 104 98 - 111 mmol/L   CO2 27 22 - 32 mmol/L   Glucose, Bld 204 (H) 70 - 99 mg/dL    Comment: Glucose reference range applies only to samples taken after fasting for at least 8 hours.   BUN 12 8 - 23 mg/dL   Creatinine, Ser 1.20 (H) 0.44 - 1.00 mg/dL   Calcium 10.4 (H) 8.9 - 10.3 mg/dL   Total Protein 6.1 (L)  6.5 - 8.1 g/dL   Albumin 3.5 3.5 - 5.0 g/dL   AST 14 (L) 15 - 41 U/L   ALT 10 0 - 44 U/L   Alkaline Phosphatase 88 38 - 126 U/L   Total Bilirubin 0.7 0.3 - 1.2 mg/dL   GFR, Estimated 47 (L) >60 mL/min    Comment: (NOTE) Calculated using the CKD-EPI Creatinine Equation (2021)    Anion gap 5 5 - 15    Comment: Performed at [facility], [address] [place], [place] [address]  CBC with Differential     Status: Abnormal   Collection Time: 03/17/22  9:46 AM  Result Value Ref Range   WBC 8.5 4.0 - 10.5 K/uL   RBC 4.83 3.87 - 5.11 MIL/uL   Hemoglobin 11.8 (L) 12.0 - 15.0 g/dL   HCT 36.8 36.0 - 46.0 %   MCV 76.2 (L) 80.0 - 100.0 fL   MCH 24.4 (L) 26.0 - 34.0 pg   MCHC 32.1 30.0 - 36.0 g/dL   RDW 13.7 11.5 - 15.5 %   Platelets 225 150 - 400 K/uL   nRBC 0.0 0.0 - 0.2 %   Neutrophils Relative % 51 %   Neutro Abs 4.3 1.7 - 7.7 K/uL   Lymphocytes Relative 34 %   Lymphs Abs 2.9 0.7 -  4.0 K/uL   Monocytes Relative 13 %   Monocytes Absolute 1.1 (H) 0.1 - 1.0 K/uL   Eosinophils Relative 2 %   Eosinophils Absolute 0.2 0.0 - 0.5 K/uL   Basophils Relative 0 %   Basophils Absolute 0.0 0.0 - 0.1 K/uL   Immature Granulocytes 0 %   Abs Immature Granulocytes 0.03 0.00 - 0.07 K/uL    Comment: Performed at [facility], [address] [place], [place] [address]  Lipase, blood     Status: None   Collection Time: 03/17/22  9:46 AM  Result Value Ref Range   Lipase 26 11 - 51 U/L    Comment: Performed at [facility], [address] [place], [place] [address]  Urinalysis, Routine w reflex microscopic Urine, Clean Catch     Status: Abnormal   Collection Time: 03/17/22 10:56 AM  Result Value Ref Range   Color, Urine YELLOW YELLOW   APPearance CLOUDY (A) CLEAR   Specific Gravity, Urine 1.015 1.005 - 1.030   pH 5.5 5.0 - 8.0   Glucose, UA NEGATIVE NEGATIVE mg/dL   Hgb urine dipstick NEGATIVE NEGATIVE   Bilirubin Urine NEGATIVE NEGATIVE   Ketones, ur NEGATIVE  NEGATIVE mg/dL   Protein, ur NEGATIVE NEGATIVE mg/dL   Nitrite NEGATIVE NEGATIVE   Leukocytes,Ua SMALL (A) NEGATIVE    Comment: Performed at [facility], [address] [place], [place] [address]  Urinalysis, Microscopic (reflex)     Status: Abnormal   Collection Time: 03/17/22 10:56 AM  Result Value Ref Range   RBC / HPF 0-5 0 - 5 RBC/hpf   WBC, UA 11-20 0 - 5 WBC/hpf   Bacteria, UA MANY (A) NONE SEEN   Squamous Epithelial / LPF 6-10 0 - 5   Mucus PRESENT     Comment: Performed at [facility], [address] [place], [place] [address]    No results found.  ROS - all of the below systems have been reviewed with the patient and positives are indicated with bold text General: chills, fever or night sweats Eyes: blurry vision or double vision ENT: epistaxis or sore throat Allergy/Immunology: itchy/watery eyes or nasal congestion Hematologic/Lymphatic: bleeding problems, blood clots or swollen lymph nodes Endocrine: temperature intolerance or unexpected weight changes Breast: new or changing breast lumps or nipple discharge Resp: cough, shortness of breath, or wheezing CV: chest pain or dyspnea on exertion GI: as per HPI GU: dysuria, trouble voiding, or hematuria MSK: joint pain or joint stiffness Neuro: TIA or stroke symptoms Derm: pruritus and skin lesion changes Psych: anxiety and depression  PE Blood pressure (!) 225/77, pulse (!) 54, temperature 98.6 F (37 C), resp. rate 16, height '5\' 4"'$  (1.626 m), weight 90.7 kg, SpO2 99 %. Constitutional: NAD; conversant; no deformities Eyes: Moist conjunctiva; no lid lag; anicteric; PERRL Neck: Trachea midline; no thyromegaly Lungs: Normal respiratory effort; no tactile fremitus CV: RRR; no palpable thrills; no pitting edema GI: Abd soft, focally ttp in RLQ; no rebound/guarding per se; no palpable hepatosplenomegaly MSK: Normal range of motion of extremities; no clubbing/cyanosis Psychiatric: Appropriate  affect; alert and oriented x3 Lymphatic: No palpable cervical or axillary lymphadenopathy  Results for orders placed or performed during the hospital encounter of 03/17/22 (from the past 48 hour(s))  Comprehensive metabolic panel     Status: Abnormal   Collection Time: 03/17/22  9:46 AM  Result Value Ref Range   Sodium 136 135 - 145 mmol/L  Potassium 3.6 3.5 - 5.1 mmol/L   Chloride 104 98 - 111 mmol/L   CO2 27 22 - 32 mmol/L   Glucose, Bld 204 (H) 70 - 99 mg/dL    Comment: Glucose reference range applies only to samples taken after fasting for at least 8 hours.   BUN 12 8 - 23 mg/dL   Creatinine, Ser 1.20 (H) 0.44 - 1.00 mg/dL   Calcium 10.4 (H) 8.9 - 10.3 mg/dL   Total Protein 6.1 (L) 6.5 - 8.1 g/dL   Albumin 3.5 3.5 - 5.0 g/dL   AST 14 (L) 15 - 41 U/L   ALT 10 0 - 44 U/L   Alkaline Phosphatase 88 38 - 126 U/L   Total Bilirubin 0.7 0.3 - 1.2 mg/dL   GFR, Estimated 47 (L) >60 mL/min    Comment: (NOTE) Calculated using the CKD-EPI Creatinine Equation (2021)    Anion gap 5 5 - 15    Comment: Performed at [facility], [address] [place], [place] [address]  CBC with Differential     Status: Abnormal   Collection Time: 03/17/22  9:46 AM  Result Value Ref Range   WBC 8.5 4.0 - 10.5 K/uL   RBC 4.83 3.87 - 5.11 MIL/uL   Hemoglobin 11.8 (L) 12.0 - 15.0 g/dL   HCT 36.8 36.0 - 46.0 %   MCV 76.2 (L) 80.0 - 100.0 fL   MCH 24.4 (L) 26.0 - 34.0 pg   MCHC 32.1 30.0 - 36.0 g/dL   RDW 13.7 11.5 - 15.5 %   Platelets 225 150 - 400 K/uL   nRBC 0.0 0.0 - 0.2 %   Neutrophils Relative % 51 %   Neutro Abs 4.3 1.7 - 7.7 K/uL   Lymphocytes Relative 34 %   Lymphs Abs 2.9 0.7 - 4.0 K/uL   Monocytes Relative 13 %   Monocytes Absolute 1.1 (H) 0.1 - 1.0 K/uL   Eosinophils Relative 2 %   Eosinophils Absolute 0.2 0.0 - 0.5 K/uL   Basophils Relative 0 %   Basophils Absolute 0.0 0.0 - 0.1 K/uL   Immature Granulocytes 0 %   Abs Immature Granulocytes 0.03 0.00 - 0.07 K/uL    Comment:  Performed at [facility], [address] [place], [place] [address]  Lipase, blood     Status: None   Collection Time: 03/17/22  9:46 AM  Result Value Ref Range   Lipase 26 11 - 51 U/L    Comment: Performed at [facility], [address] [place], [place] [address]  Urinalysis, Routine w reflex microscopic Urine, Clean Catch     Status: Abnormal   Collection Time: 03/17/22 10:56 AM  Result Value Ref Range   Color, Urine YELLOW YELLOW   APPearance CLOUDY (A) CLEAR   Specific Gravity, Urine 1.015 1.005 - 1.030   pH 5.5 5.0 - 8.0   Glucose, UA NEGATIVE NEGATIVE mg/dL   Hgb urine dipstick NEGATIVE NEGATIVE   Bilirubin Urine NEGATIVE NEGATIVE   Ketones, ur NEGATIVE NEGATIVE mg/dL   Protein, ur NEGATIVE NEGATIVE mg/dL   Nitrite NEGATIVE NEGATIVE   Leukocytes,Ua SMALL (A) NEGATIVE    Comment: Performed at [facility], [address] [place], [place] [address]  Urinalysis, Microscopic (reflex)     Status: Abnormal   Collection Time: 03/17/22 10:56 AM  Result Value Ref Range   RBC / HPF 0-5 0 - 5 RBC/hpf   WBC, UA 11-20 0 -  5 WBC/hpf   Bacteria, UA MANY (A) NONE SEEN   Squamous Epithelial / LPF 6-10 0 - 5   Mucus PRESENT     Comment: Performed at [facility], [address] [place], [place] [address]    No results found.  I have personally reviewed the relevant CT A/P 03/17/22, CBC, CMP  CT A/P 03/17/22: Report has not crossed over but through the PACS system demonstrates (Per Dr. Kris Hartmann) "acute appendicitis with mild to moderate periappendiceal inflammatory changes.  Positive for acute appendicitis.  No evidence of abscess or other complication." Appendix is measured at 11 mm, no appendicolith  A/P: Megan Tate is an 76 y.o. female with HTN, DM, CLL - with acute appendicitis without evident perforation or abscess  -Agree with medical admission given her comorbidities and significant hypertension  -Discussed her case with Dr. Lanetta Inch  and EDP Dr. Darl Householder.  Hypertension which appears currently poorly controlled will need to be addressed.  Dr. Darl Householder was giving her IV hydralazine. If better, felt safe to proceed with surgery today with perioperative control with IV agents if necessary if OR availability allows.  -The anatomy and physiology of the GI tract was discussed at length with the patient. The pathophysiology of appendicitis was discussed as well. -We reviewed options moving forward for treatment, covering IV abx vs surgery. We discussed that with antibiotics alone, there is reasonable success in managing appendicitis, however, risks of recurrence at 35yr being as high as 40% in some studies; we discussed in patients in her age range higher incidence of other causes of appendicitis and that generally appendectomy is favored. We discussed appendectomy - laparoscopic and potential open techniques as well as scenarios where an ileocecectomy could be necessary. We discussed the material risks (including, but not limited to, pain, bleeding, infection, scarring, need for blood transfusion, damage to surrounding structures- blood vessels/nerves/viscus/organs, damage to ureter/bladder, urine leak, leak from staple line, need for additional procedures, hernia, recurrence although quite low, pneumonia, heart attack, stroke, death) benefits and alternatives to surgery were discussed. The patient's questions were answered to her satisfaction, she voiced understanding and elected to proceed with surgery. Additionally, we discussed typical postoperative expectations and the recovery process.  I spent a total of 60 minutes in both face-to-face and non-face-to-face activities, excluding procedures performed, for this visit on the date of this encounter.  CNadeen Landau [facility], [place]

## 2022-03-17 NOTE — ED Provider Notes (Signed)
  Physical Exam  BP (!) 161/80   Pulse 69   Temp 98.6 F (37 C)   Resp 18   Ht '5\' 4"'$  (1.626 m)   Wt 90.7 kg   SpO2 98%   BMI 34.33 kg/m   Physical Exam  Procedures  Procedures  ED Course / MDM    Medical Decision Making Patient was transferred from [facility].  Patient has history of hypertension and has appendicitis on CT scan.  Patient is here to see surgery  4 pm Dr. Dema Severin is at bedside.  He was concern for patient's blood pressure.  On arrival here he was over 240 Patient is on hydralazine and Norvasc and losartan and clonidine at home.  I gave her hydralazine.  Blood pressure came down to the 160s.  Patient is supposed to go to surgery later today.  Dr. Dema Severin requested medicine admission and he will take patient to the operating room soon.  Amount and/or Complexity of Data Reviewed Labs: ordered. Radiology: ordered.  Risk Prescription drug management. Decision regarding hospitalization.          Drenda Freeze, MD 03/17/22 1700

## 2022-03-17 NOTE — Anesthesia Procedure Notes (Signed)
Procedure Name: Intubation Date/Time: 03/17/2022 6:41 PM  Performed by: Cynda Familia, CRNAPre-anesthesia Checklist: Patient identified, Emergency Drugs available, Suction available and Patient being monitored Patient Re-evaluated:Patient Re-evaluated prior to induction Oxygen Delivery Method: Circle System Utilized Preoxygenation: Pre-oxygenation with 100% oxygen Induction Type: IV induction Ventilation: Mask ventilation without difficulty Laryngoscope Size: Miller and 2 Grade View: Grade I Tube type: Oral Number of attempts: 1 Airway Equipment and Method: Stylet Placement Confirmation: ETT inserted through vocal cords under direct vision, positive ETCO2 and breath sounds checked- equal and bilateral Secured at: 21 cm Tube secured with: Tape Dental Injury: Teeth and Oropharynx as per pre-operative assessment  Comments: IV induction [place]-- intubation AM CRNA atraumatic-- mouth as preop no teeth

## 2022-03-18 ENCOUNTER — Encounter (HOSPITAL_COMMUNITY): Payer: Self-pay | Admitting: Internal Medicine

## 2022-03-18 ENCOUNTER — Other Ambulatory Visit: Payer: Self-pay

## 2022-03-18 DIAGNOSIS — K3580 Unspecified acute appendicitis: Secondary | ICD-10-CM | POA: Diagnosis not present

## 2022-03-18 LAB — CBC
HCT: 38.8 % (ref 36.0–46.0)
Hemoglobin: 12 g/dL (ref 12.0–15.0)
MCH: 24 pg — ABNORMAL LOW (ref 26.0–34.0)
MCHC: 30.9 g/dL (ref 30.0–36.0)
MCV: 77.6 fL — ABNORMAL LOW (ref 80.0–100.0)
Platelets: 218 10*3/uL (ref 150–400)
RBC: 5 MIL/uL (ref 3.87–5.11)
RDW: 13.9 % (ref 11.5–15.5)
WBC: 12.7 10*3/uL — ABNORMAL HIGH (ref 4.0–10.5)
nRBC: 0 % (ref 0.0–0.2)

## 2022-03-18 LAB — BASIC METABOLIC PANEL
Anion gap: 11 (ref 5–15)
BUN: 12 mg/dL (ref 8–23)
CO2: 23 mmol/L (ref 22–32)
Calcium: 10 mg/dL (ref 8.9–10.3)
Chloride: 102 mmol/L (ref 98–111)
Creatinine, Ser: 1.24 mg/dL — ABNORMAL HIGH (ref 0.44–1.00)
GFR, Estimated: 45 mL/min — ABNORMAL LOW (ref >60)
Glucose, Bld: 210 mg/dL — ABNORMAL HIGH (ref 70–99)
Potassium: 4 mmol/L (ref 3.5–5.1)
Sodium: 136 mmol/L (ref 135–145)

## 2022-03-18 MED ORDER — HYDROMORPHONE HCL 1 MG/ML IJ SOLN: 0.5000 mg | INTRAMUSCULAR | Status: DC | PRN

## 2022-03-18 NOTE — Progress Notes (Signed)
1 Day Post-Op   Subjective/Chief Complaint: Required some IV Dilaudid this morning Currently comfortable Still with some hypertension No nausea or vomiting No flatus yet   Objective: Vital signs in last 24 hours: Temp:  [98.4 F (36.9 C)-99.3 F (37.4 C)] 99 F (37.2 C) (06/18 9024) Pulse Rate:  [44-91] 75 (06/18 0642) Resp:  [11-18] 18 (06/18 0642) BP: (160-241)/(62-97) 179/72 (06/18 0642) SpO2:  [94 %-100 %] 97 % (06/18 0642) Weight:  [90.7 kg] 90.7 kg (06/17 0931)    Intake/Output from previous day: 06/17 0701 - 06/18 0700 In: 2640.8 [I.V.:1340.8; IV Piggyback:1200] Out: 905 [Urine:875; Blood:30] Intake/Output this shift: No intake/output data recorded.  Obese female in NAD Still wearing nasal cannula - sats 97% Midline incisional tenderness - no RLQ tenderness Incisions c/d/i  Lab Results:  Recent Labs    03/17/22 0946 03/18/22 0402  WBC 8.5 12.7*  HGB 11.8* 12.0  HCT 36.8 38.8  PLT 225 218   BMET Recent Labs    03/17/22 0946 03/18/22 0402  NA 136 136  K 3.6 4.0  CL 104 102  CO2 27 23  GLUCOSE 204* 210*  BUN 12 12  CREATININE 1.20* 1.24*  CALCIUM 10.4* 10.0   PT/INR No results for input(s): "LABPROT", "INR" in the last 72 hours. ABG No results for input(s): "PHART", "HCO3" in the last 72 hours.  Invalid input(s): "PCO2", "PO2"  Studies/Results: CT ABDOMEN PELVIS W CONTRAST  Result Date: 03/17/2022 CLINICAL DATA:  Right lower quadrant pain for 2 days. EXAM: CT ABDOMEN AND PELVIS WITH CONTRAST TECHNIQUE: Multidetector CT imaging of the abdomen and pelvis was performed using the standard protocol following bolus administration of intravenous contrast. RADIATION DOSE REDUCTION: This exam was performed according to the departmental dose-optimization program which includes automated exposure control, adjustment of the mA and/or kV according to patient size and/or use of iterative reconstruction technique. CONTRAST:  16m OMNIPAQUE IOHEXOL 300 MG/ML   SOLN COMPARISON:  11/02/2019 FINDINGS: Lower Chest: No acute findings. Hepatobiliary: No hepatic masses identified. Gallbladder is unremarkable. No evidence of biliary ductal dilatation. Pancreas:  No mass or inflammatory changes. Spleen: Within normal limits in size and appearance. Adrenals/Urinary Tract: No masses identified. Small left renal cysts noted (no followup imaging recommended). No evidence of ureteral calculi or hydronephrosis. Stomach/Bowel: Findings of acute appendicitis are seen with mild-to-moderate periappendiceal inflammatory changes. Appendix: Location- Standard Diameter-11 mm Appendicolith- Absent Mucosal hyper-enhancement- Present Extraluminal Gas- Absent Periappendiceal Collection- None Vascular/Lymphatic: No pathologically enlarged lymph nodes. No acute vascular findings. Reproductive:  No mass or other significant abnormality. Other:  None. Musculoskeletal:  No suspicious bone lesions identified. IMPRESSION: Positive for acute appendicitis. No evidence of abscess or other complication. Electronically Signed   By: JMarlaine HindM.D.   On: 03/17/2022 11:55    Anti-infectives: Anti-infectives (From admission, onward)    Start     Dose/Rate Route Frequency Ordered Stop   03/18/22 1000  terbinafine (LAMISIL) tablet 250 mg        250 mg Oral Daily 03/17/22 2216     03/17/22 1845  cefTRIAXone (ROCEPHIN) 2 g in sodium chloride 0.9 % 100 mL IVPB        2 g 200 mL/hr over 30 Minutes Intravenous  Once 03/17/22 1830 03/17/22 1913   03/17/22 1845  metroNIDAZOLE (FLAGYL) IVPB 500 mg        500 mg 100 mL/hr over 60 Minutes Intravenous  Once 03/17/22 1830 03/17/22 1904   03/17/22 1827  sodium chloride 0.9 % with cefTRIAXone (ROCEPHIN) ADS  Med       Note to Pharmacy: Quenten Raven: cabinet override      03/17/22 1827 03/17/22 1918       Assessment/Plan: Laparoscopic right hemicolectomy 03/17/22 - Dr. 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

## 2022-03-18 NOTE — Progress Notes (Signed)
PT Cancellation Note  Patient Details Name: Megan Tate MRN: [id number] DOB: 1946-05-24   Cancelled Treatment:     PT order received but eval deferred - pt declined 2* pain and fatigue - RN aware of pt request for pain meds.  Will follow.   Colbe Viviano 03/18/2022, 4:07 PM

## 2022-03-18 NOTE — Progress Notes (Signed)
PROGRESS NOTE    Megan Tate  FKC:[id number] DOB: 09-12-1946 DOA: 03/17/2022 PCP: Pcp, No   Brief Narrative: 76 year old female admitted with abdominal pain nausea vomiting found to have acute appendicitis by CT scan she was taken to the OR 03/17/2022 found appendiceal mass with associated appendicitis patient had laparoscopic right hemicolectomy. Her past medical history significant for type 2 diabetes, hypertension, CLL, glaucoma, hyperlipidemia, gout. 03/18/2022-patient denies having any flatus or bowel movements.  Her daughter is by the bedside.  She has not gotten out of bed.   Assessment & Plan:   Principal Problem:   Acute appendicitis Active Problems:   CLL (chronic lymphocytic leukemia) (HCC)   Benign essential hypertension   Type 2 diabetes mellitus without complication, with long-term current use of insulin (HCC)   Hypercalcemia   Hyperlipidemia   #1 acute appendicitis status post laparoscopic right hemicolectomy.  In OR she had appendiceal mass with associated appendicitis. PT OT consult out of bed ambulate Pain control Tylenol standing dose Follow-up biopsy  #2 history of essential hypertension on amlodipine clonidine hydralazine losartan and metoprolol Hold losartan  #3 type 2 diabetes SSI CBG (last 3)  Recent Labs    03/17/22 1825 03/17/22 2058  GLUCAP 176* 181*  Restart Janumet once she starts taking p.o. intake     #4 hyperlipidemia on statin  #5 CLL follow-up with oncology  #6 glaucoma continue eyedrops  #7 leukocytosis follow-up urine culture currently not on antibiotics    Estimated body mass index is 34.33 kg/m as calculated from the following:   Height as of this encounter: '5\' 4"'$  (1.626 m).   Weight as of this encounter: 90.7 kg.  DVT prophylaxis: Lovenox  code Status: Full code  family Communication: Daughter at bedside  disposition Plan:  Status is: Inpatient Remains inpatient appropriate because: Status post surgery for appendiceal  mass   Consultants:  General surgery  Procedures:  laparoscopic right hemicolectomy Antimicrobials: none Subjective: She is resting in bed weak tired daughter by the bedside Creatinine 1.24 WBC count 12.7 hemoglobin 12 platelets 218  Objective: Vitals:   03/18/22 0301 03/18/22 0642 03/18/22 0921 03/18/22 0959  BP: (!) 188/79 (!) 179/72  (!) 147/71  Pulse: 69 75  67  Resp:  18    Temp:  99 F (37.2 C)  98.2 F (36.8 C)  TempSrc:      SpO2:  97% 94% 92%  Weight:      Height:        Intake/Output Summary (Last 24 hours) at 03/18/2022 1150 Last data filed at 03/18/2022 0306 Gross per 24 hour  Intake 2640.84 ml  Output 905 ml  Net 1735.84 ml   Filed Weights   03/17/22 0931  Weight: 90.7 kg    Examination:  General exam: Appears calm and comfortable  Respiratory system: Clear to auscultation. Respiratory effort normal. Cardiovascular system: S1 & S2 heard, RRR. No JVD, murmurs, rubs, gallops or clicks. No pedal edema. Gastrointestinal system: Abdomen is nondistended, soft and nontender. No organomegaly or masses felt.  Diminished bowel sounds heard.  Incision with some blood-tinged drainage otherwise clear Central nervous system: Alert and oriented. No focal neurological deficits. Extremities: Symmetric 5 x 5 power. Skin: No rashes, lesions or ulcers Psychiatry: Judgement and insight appear normal. Mood & affect appropriate.     Data Reviewed: I have personally reviewed following labs and imaging studies  CBC: Recent Labs  Lab 03/17/22 0946 03/18/22 0402  WBC 8.5 12.7*  NEUTROABS 4.3  --   HGB 11.8*  12.0  HCT 36.8 38.8  MCV 76.2* 77.6*  PLT 225 008   Basic Metabolic Panel: Recent Labs  Lab 03/17/22 0946 03/18/22 0402  NA 136 136  K 3.6 4.0  CL 104 102  CO2 27 23  GLUCOSE 204* 210*  BUN 12 12  CREATININE 1.20* 1.24*  CALCIUM 10.4* 10.0   GFR: Estimated Creatinine Clearance: 42.8 mL/min (A) (by C-G formula based on SCr of 1.24 mg/dL (H)). Liver  Function Tests: Recent Labs  Lab 03/17/22 0946  AST 14*  ALT 10  ALKPHOS 88  BILITOT 0.7  PROT 6.1*  ALBUMIN 3.5   Recent Labs  Lab 03/17/22 0946  LIPASE 26   No results for input(s): "AMMONIA" in the last 168 hours. Coagulation Profile: No results for input(s): "INR", "PROTIME" in the last 168 hours. Cardiac Enzymes: No results for input(s): "CKTOTAL", "CKMB", "CKMBINDEX", "TROPONINI" in the last 168 hours. BNP (last 3 results) No results for input(s): "PROBNP" in the last 8760 hours. HbA1C: No results for input(s): "HGBA1C" in the last 72 hours. CBG: Recent Labs  Lab 03/17/22 1825 03/17/22 2058  GLUCAP 176* 181*   Lipid Profile: No results for input(s): "CHOL", "HDL", "LDLCALC", "TRIG", "CHOLHDL", "LDLDIRECT" in the last 72 hours. Thyroid Function Tests: No results for input(s): "TSH", "T4TOTAL", "FREET4", "T3FREE", "THYROIDAB" in the last 72 hours. Anemia Panel: No results for input(s): "VITAMINB12", "FOLATE", "FERRITIN", "TIBC", "IRON", "RETICCTPCT" in the last 72 hours. Sepsis Labs: No results for input(s): "PROCALCITON", "LATICACIDVEN" in the last 168 hours.  No results found for this or any previous visit (from the past 240 hour(s)).       Radiology Studies: CT ABDOMEN PELVIS W CONTRAST  Result Date: 03/17/2022 CLINICAL DATA:  Right lower quadrant pain for 2 days. EXAM: CT ABDOMEN AND PELVIS WITH CONTRAST TECHNIQUE: Multidetector CT imaging of the abdomen and pelvis was performed using the standard protocol following bolus administration of intravenous contrast. RADIATION DOSE REDUCTION: This exam was performed according to the departmental dose-optimization program which includes automated exposure control, adjustment of the mA and/or kV according to patient size and/or use of iterative reconstruction technique. CONTRAST:  161m OMNIPAQUE IOHEXOL 300 MG/ML  SOLN COMPARISON:  11/02/2019 FINDINGS: Lower Chest: No acute findings. Hepatobiliary: No hepatic masses  identified. Gallbladder is unremarkable. No evidence of biliary ductal dilatation. Pancreas:  No mass or inflammatory changes. Spleen: Within normal limits in size and appearance. Adrenals/Urinary Tract: No masses identified. Small left renal cysts noted (no followup imaging recommended). No evidence of ureteral calculi or hydronephrosis. Stomach/Bowel: Findings of acute appendicitis are seen with mild-to-moderate periappendiceal inflammatory changes. Appendix: Location- Standard Diameter-11 mm Appendicolith- Absent Mucosal hyper-enhancement- Present Extraluminal Gas- Absent Periappendiceal Collection- None Vascular/Lymphatic: No pathologically enlarged lymph nodes. No acute vascular findings. Reproductive:  No mass or other significant abnormality. Other:  None. Musculoskeletal:  No suspicious bone lesions identified. IMPRESSION: Positive for acute appendicitis. No evidence of abscess or other complication. Electronically Signed   By: JMarlaine HindM.D.   On: 03/17/2022 11:55        Scheduled Meds:  acetaminophen  1,000 mg Oral Q6H   amLODipine  5 mg Oral Daily   cloNIDine  0.2 mg Oral BID   dorzolamide-timolol  1 drop Both Eyes BID   enoxaparin (LOVENOX) injection  40 mg Subcutaneous QQ76P  folic acid  1 mg Oral Daily   hydrALAZINE  25 mg Oral BID   loratadine  10 mg Oral QHS   losartan  100 mg Oral Daily  metoprolol succinate  25 mg Oral Daily   rosuvastatin  20 mg Oral Daily   terbinafine  250 mg Oral Daily   Continuous Infusions:  lactated ringers 100 mL/hr at 03/17/22 2341     LOS: 1 day    Time spent: 29 min   Georgette Shell, MD  03/18/2022, 11:50 AM

## 2022-03-19 ENCOUNTER — Encounter (HOSPITAL_COMMUNITY): Payer: Self-pay | Admitting: Surgery

## 2022-03-19 DIAGNOSIS — K3580 Unspecified acute appendicitis: Secondary | ICD-10-CM | POA: Diagnosis not present

## 2022-03-19 NOTE — Anesthesia Postprocedure Evaluation (Signed)
Anesthesia Post Note  Patient: Javon Hupfer  Procedure(s) Performed: APPENDECTOMY LAPAROSCOPIC CONVERTED TO RIGHT HEMI COLECTOMY     Patient location during evaluation: PACU Anesthesia Type: General Level of consciousness: awake and alert Pain management: pain level controlled Vital Signs Assessment: post-procedure vital signs reviewed and stable Respiratory status: spontaneous breathing, nonlabored ventilation, respiratory function stable and patient connected to nasal cannula oxygen Cardiovascular status: blood pressure returned to baseline and stable Postop Assessment: no apparent nausea or vomiting Anesthetic complications: no   No notable events documented.  Last Vitals:  Vitals:   03/18/22 1553 03/19/22 0323  BP: (!) 154/58 (!) 142/59  Pulse: (!) 55 (!) 58  Resp:  17  Temp:  36.8 C  SpO2:  98%    Last Pain:  Vitals:   03/18/22 2200  TempSrc:   PainSc: 0-No pain   Pain Goal: Patients Stated Pain Goal: 3 (03/18/22 1550)                 [place]

## 2022-03-19 NOTE — TOC Progression Note (Signed)
Transition of Care [facility]) - Progression Note    Patient Details  Name: Megan Tate MRN: [id number] Date of Birth: 1946/09/20  Transition of Care [facility]) CM/SW Contact  Purcell Mouton, RN Phone Number: 03/19/2022, 4:05 PM  Clinical Narrative:      Transition of Care (TOC) Screening Note   Patient Details  Name: Megan Tate Date of Birth: [birth year]   Transition of Care [facility]) CM/SW Contact:    Purcell Mouton, RN Phone Number: 03/19/2022, 4:05 PM    Transition of Care Department [facility]) has reviewed patient and no TOC needs have been identified at this time. We will continue to monitor patient advancement through interdisciplinary progression rounds. If new patient transition needs arise, please place a TOC consult.         Expected Discharge Plan and Services                                                 Social Determinants of Health (SDOH) Interventions    Readmission Risk Interventions     No data to display

## 2022-03-19 NOTE — Progress Notes (Signed)
2 Days Post-Op   Subjective/Chief Complaint: Denies pain.  Up in chair.  On regular diet, but no bowel function yet.  Had some nausea yesterday, but resolved.   Objective: Vital signs in last 24 hours: Temp:  [98.2 F (36.8 C)-98.9 F (37.2 C)] 98.9 F (37.2 C) (06/19 0855) Pulse Rate:  [55-67] 59 (06/19 0855) Resp:  [17-20] 20 (06/19 0855) BP: (142-164)/(58-73) 164/73 (06/19 0855) SpO2:  [92 %-99 %] 99 % (06/19 0855) Last BM Date : 03/16/22  Intake/Output from previous day: 06/18 0701 - 06/19 0700 In: 240 [P.O.:240] Out: 300 [Urine:300] Intake/Output this shift: No intake/output data recorded.  PE: Abd: soft, appropriately tender, midline incision with staples and honeycomb dressing in place.  Hypoactive BS, ND  Lab Results:  Recent Labs    03/17/22 0946 03/18/22 0402  WBC 8.5 12.7*  HGB 11.8* 12.0  HCT 36.8 38.8  PLT 225 218   BMET Recent Labs    03/17/22 0946 03/18/22 0402  NA 136 136  K 3.6 4.0  CL 104 102  CO2 27 23  GLUCOSE 204* 210*  BUN 12 12  CREATININE 1.20* 1.24*  CALCIUM 10.4* 10.0   PT/INR No results for input(s): "LABPROT", "INR" in the last 72 hours. ABG No results for input(s): "PHART", "HCO3" in the last 72 hours.  Invalid input(s): "PCO2", "PO2"  Studies/Results: CT ABDOMEN PELVIS W CONTRAST  Result Date: 03/17/2022 CLINICAL DATA:  Right lower quadrant pain for 2 days. EXAM: CT ABDOMEN AND PELVIS WITH CONTRAST TECHNIQUE: Multidetector CT imaging of the abdomen and pelvis was performed using the standard protocol following bolus administration of intravenous contrast. RADIATION DOSE REDUCTION: This exam was performed according to the departmental dose-optimization program which includes automated exposure control, adjustment of the mA and/or kV according to patient size and/or use of iterative reconstruction technique. CONTRAST:  122m OMNIPAQUE IOHEXOL 300 MG/ML  SOLN COMPARISON:  11/02/2019 FINDINGS: Lower Chest: No acute findings.  Hepatobiliary: No hepatic masses identified. Gallbladder is unremarkable. No evidence of biliary ductal dilatation. Pancreas:  No mass or inflammatory changes. Spleen: Within normal limits in size and appearance. Adrenals/Urinary Tract: No masses identified. Small left renal cysts noted (no followup imaging recommended). No evidence of ureteral calculi or hydronephrosis. Stomach/Bowel: Findings of acute appendicitis are seen with mild-to-moderate periappendiceal inflammatory changes. Appendix: Location- Standard Diameter-11 mm Appendicolith- Absent Mucosal hyper-enhancement- Present Extraluminal Gas- Absent Periappendiceal Collection- None Vascular/Lymphatic: No pathologically enlarged lymph nodes. No acute vascular findings. Reproductive:  No mass or other significant abnormality. Other:  None. Musculoskeletal:  No suspicious bone lesions identified. IMPRESSION: Positive for acute appendicitis. No evidence of abscess or other complication. Electronically Signed   By: JMarlaine HindM.D.   On: 03/17/2022 11:55    Anti-infectives: Anti-infectives (From admission, onward)    Start     Dose/Rate Route Frequency Ordered Stop   03/18/22 1000  terbinafine (LAMISIL) tablet 250 mg        250 mg Oral Daily 03/17/22 2216     03/17/22 1845  cefTRIAXone (ROCEPHIN) 2 g in sodium chloride 0.9 % 100 mL IVPB        2 g 200 mL/hr over 30 Minutes Intravenous  Once 03/17/22 1830 03/17/22 1913   03/17/22 1845  metroNIDAZOLE (FLAGYL) IVPB 500 mg        500 mg 100 mL/hr over 60 Minutes Intravenous  Once 03/17/22 1830 03/17/22 1904   03/17/22 1827  sodium chloride 0.9 % with cefTRIAXone (ROCEPHIN) ADS Med  Note to Pharmacy: Dara Lords M: cabinet override      03/17/22 1827 03/17/22 1918       Assessment/Plan: POD 2, s/p Laparoscopic right hemicolectomy 03/17/22 - Dr. Annye English for right colon/ cecal mass involving the base of the appendix -Pathology pending -on soft diet, but no bowel function yet.  Advised to  take it slow and let us know if she develops N/V. -mobilize, pulm toilet  QVZ:DGLO diet VTE: Lovenox ID: none  -- Per TRH --  Hypertension Diabetes CLL  LOS: 2 days    Henreitta Cea 03/19/2022

## 2022-03-19 NOTE — Evaluation (Signed)
Occupational Therapy Evaluation Patient Details Name: Megan Tate MRN: [id number] DOB: 1945-10-21 Today's Date: 03/19/2022   History of Present Illness Patient is a 76 year old female who presented to the hospital with abdominal pain and nausea. patient was found to have acute appendicitis. patient underwent an appendectomy laproscopic converted to right hemi colectomy. PMH:CLL, glaucoma, hyperlipidemia   Clinical Impression   Patient is a 76 year old female who was visiting [place] from out of state with husband prior to hospitalization. Patient plans to transition to daughters house at time of d/c from hospital. Patient anticipated to progress to not need OT follow up at time of d/c. Patient would benefit from continued skilled OT services for functional activity endurance and AD/AE training to increase independence in ADLs. Patient would continue to benefit from skilled OT services at this time while admitted and after d/c to address noted deficits in order to improve overall safety and independence in ADLs.       Recommendations for follow up therapy are one component of a multi-disciplinary discharge planning process, led by the attending physician.  Recommendations may be updated based on patient status, additional functional criteria and insurance authorization.   Follow Up Recommendations  No OT follow up    Assistance Recommended at Discharge Intermittent Supervision/Assistance  Patient can return home with the following Assistance with cooking/housework;Assist for transportation;Help with stairs or ramp for entrance;A little help with bathing/dressing/bathroom    Functional Status Assessment  Patient has had a recent decline in their functional status and demonstrates the ability to make significant improvements in function in a reasonable and predictable amount of time.  Equipment Recommendations  Other (comment) (TBD)    Recommendations for Other Services        Precautions / Restrictions Precautions Precautions: None Restrictions Weight Bearing Restrictions: No      Mobility Bed Mobility Overal bed mobility: Needs Assistance Bed Mobility: Supine to Sit     Supine to sit: HOB elevated, Min guard     General bed mobility comments: with increased time and cues for proper hand and foot placement    Transfers                          Balance Overall balance assessment: Mild deficits observed, not formally tested                                         ADL either performed or assessed with clinical judgement   ADL Overall ADL's : Needs assistance/impaired Eating/Feeding: Set up;Sitting   Grooming: Min guard;Standing;Wash/dry hands   Upper Body Bathing: Set up;Sitting   Lower Body Bathing: Maximal assistance;Sitting/lateral leans   Upper Body Dressing : Set up;Sitting   Lower Body Dressing: Sit to/from stand;Moderate assistance   Toilet Transfer: Minimal assistance;Rolling walker (2 wheels) Toilet Transfer Details (indicate cue type and reason): with increased time and cues for safety and proper walker placement Toileting- Clothing Manipulation and Hygiene: Minimal assistance;Sit to/from stand       Functional mobility during ADLs: Min guard;Rolling walker (2 wheels)       Vision Patient Visual Report: No change from baseline       Perception     Praxis      Pertinent Vitals/Pain Pain Assessment Pain Assessment: No/denies pain     Hand Dominance Right   Extremity/Trunk Assessment Upper Extremity Assessment  Upper Extremity Assessment: Overall WFL for tasks assessed (noted to have some edema in BUE)   Lower Extremity Assessment Lower Extremity Assessment: Defer to PT evaluation   Cervical / Trunk Assessment Cervical / Trunk Assessment: Normal   Communication Communication Communication: No difficulties   Cognition Arousal/Alertness: Awake/alert Behavior During Therapy: WFL  for tasks assessed/performed Overall Cognitive Status: Within Functional Limits for tasks assessed                                       General Comments       Exercises     Shoulder Instructions      Home Living Family/patient expects to be discharged to:: Private residence Living Arrangements: Spouse/significant other Available Help at Discharge: Family;Available 24 hours/day Type of Home: House Home Access: Stairs to enter [employer] of Steps: 2   Home Layout: One level     Bathroom Shower/Tub: Teacher, early years/pre: Standard     Home Equipment: Shower seat   Additional Comments: patient and husband live out of state and are in [place] visiting family at this time. patient to transition to daughters house at time of d/c from [place].      Prior Functioning/Environment Prior Level of Function : Independent/Modified Independent             Mobility Comments: drives ADLs Comments: IADL's        OT Problem List: Decreased activity tolerance;Impaired balance (sitting and/or standing);Decreased safety awareness;Decreased knowledge of use of DME or AE;Decreased knowledge of precautions      OT Treatment/Interventions: Self-care/ADL training;Therapeutic exercise;Energy conservation;Neuromuscular education;DME and/or AE instruction;Therapeutic activities;Balance training;Patient/family education    OT Goals(Current goals can be found in the care plan section) Acute Rehab OT Goals Patient Stated Goal: to get better OT Goal Formulation: With patient Time For Goal Achievement: 04/02/22 Potential to Achieve Goals: Good  OT Frequency: Min 2X/week    Co-evaluation PT/OT/SLP Co-Evaluation/Treatment: Yes Reason for Co-Treatment: To address functional/ADL transfers PT goals addressed during session: Mobility/safety with mobility OT goals addressed during session: ADL's and self-care      AM-PAC OT "6 Clicks" Daily  Activity     Outcome Measure Help from another person eating meals?: A Little Help from another person taking care of personal grooming?: A Little Help from another person toileting, which includes using toliet, bedpan, or urinal?: A Little Help from another person bathing (including washing, rinsing, drying)?: A Lot Help from another person to put on and taking off regular upper body clothing?: A Little Help from another person to put on and taking off regular lower body clothing?: A Lot 6 Click Score: 16   End of Session Nurse Communication: Mobility status  Activity Tolerance: Patient tolerated treatment well Patient left: in chair;with call bell/phone within reach;with family/visitor present;with chair alarm set  OT Visit Diagnosis: Unsteadiness on feet (R26.81);Muscle weakness (generalized) (M62.81)                Time: 7096-2836 OT Time Calculation (min): 17 min Charges:  OT General Charges $OT Visit: 1 Visit OT Evaluation $OT Eval Moderate Complexity: 1 Mod  Jackelyn Poling OTR/L, MS Acute Rehabilitation Department Office# [phone] Pager# [phone]   Marcellina Millin 03/19/2022, 10:47 AM

## 2022-03-19 NOTE — Evaluation (Signed)
Physical Therapy Evaluation Patient Details Name: Megan Tate MRN: [id number] DOB: 1945/10/28 Today's Date: 03/19/2022  History of Present Illness  Patient is a 76 year old female who presented to the hospital with abdominal pain and nausea. patient was found to have acute appendicitis. patient underwent an appendectomy laproscopic converted to right hemi colectomy. PMH:CLL, glaucoma, hyperlipidemia  Clinical Impression  The patient tolerated ambulation very well using Rw. Patient  should progress to return home with family. Pt admitted with above diagnosis.  Pt currently with functional limitations due to the deficits listed below (see PT Problem List). Pt will benefit from skilled PT to increase their independence and safety with mobility to allow discharge to the venue listed below.          Recommendations for follow up therapy are one component of a multi-disciplinary discharge planning process, led by the attending physician.  Recommendations may be updated based on patient status, additional functional criteria and insurance authorization.  Follow Up Recommendations No PT follow up    Assistance Recommended at Discharge PRN  Patient can return home with the following  Assistance with cooking/housework;Assist for transportation    Equipment Recommendations Rolling walker (2 wheels)  Recommendations for Other Services       Functional Status Assessment Patient has had a recent decline in their functional status and demonstrates the ability to make significant improvements in function in a reasonable and predictable amount of time.     Precautions / Restrictions Precautions Precautions: None Restrictions Weight Bearing Restrictions: No      Mobility  Bed Mobility   Bed Mobility: Supine to Sit     Supine to sit: HOB elevated, Min guard     General bed mobility comments: with increased time and cues for proper hand and foot placement    Transfers   Equipment used:  Rolling walker (2 wheels)                    Ambulation/Gait Ambulation/Gait assistance: Min guard Gait Distance (Feet): 140 Feet Assistive device: Rolling walker (2 wheels) Gait Pattern/deviations: Step-through pattern       General Gait Details: patient steady with RW  Stairs            Wheelchair Mobility    Modified Rankin (Stroke Patients Only)       Balance Overall balance assessment: Mild deficits observed, not formally tested                                           Pertinent Vitals/Pain Pain Assessment Pain Assessment: No/denies pain    Home Living Family/patient expects to be discharged to:: Private residence Living Arrangements: Spouse/significant other Available Help at Discharge: Family;Available 24 hours/day Type of Home: House Home Access: Stairs to enter   [employer] of Steps: 2   Home Layout: One level Home Equipment: Shower seat Additional Comments: patient and husband live out of state and are in [place] visiting family at this time. patient to transition to daughters house at time of d/c from hosptial.    Prior Function Prior Level of Function : Independent/Modified Independent             Mobility Comments: drives ADLs Comments: IADL's     Hand Dominance   Dominant Hand: Right    Extremity/Trunk Assessment   Upper Extremity Assessment Upper Extremity Assessment: Overall WFL for tasks assessed  Lower Extremity Assessment Lower Extremity Assessment: Overall WFL for tasks assessed    Cervical / Trunk Assessment Cervical / Trunk Assessment: Normal  Communication   Communication: No difficulties  Cognition Arousal/Alertness: Awake/alert Behavior During Therapy: WFL for tasks assessed/performed Overall Cognitive Status: Within Functional Limits for tasks assessed                                          General Comments      Exercises      Assessment/Plan    PT Assessment Patient needs continued PT services  PT Problem List Decreased strength;Decreased mobility;Decreased activity tolerance       PT Treatment Interventions DME instruction;Functional mobility training;Gait training;Therapeutic activities;Therapeutic exercise    PT Goals (Current goals can be found in the Care Plan section)  Acute Rehab PT Goals Patient Stated Goal: to go home PT Goal Formulation: With patient/family Time For Goal Achievement: 04/02/22 Potential to Achieve Goals: Good    Frequency Min 3X/week     Co-evaluation PT/OT/SLP Co-Evaluation/Treatment: Yes Reason for Co-Treatment: To address functional/ADL transfers PT goals addressed during session: Mobility/safety with mobility OT goals addressed during session: ADL's and self-care       AM-PAC PT "6 Clicks" Mobility  Outcome Measure Help needed turning from your back to your side while in a flat bed without using bedrails?: None Help needed moving from lying on your back to sitting on the side of a flat bed without using bedrails?: None Help needed moving to and from a bed to a chair (including a wheelchair)?: A Little Help needed standing up from a chair using your arms (e.g., wheelchair or bedside chair)?: A Little Help needed to walk in hospital room?: A Little Help needed climbing 3-5 steps with a railing? : A Little 6 Click Score: 20    End of Session   Activity Tolerance: Patient tolerated treatment well Patient left: in chair;with call bell/phone within reach;with family/visitor present Nurse Communication: Mobility status PT Visit Diagnosis: Difficulty in walking, not elsewhere classified (R26.2)    Time: 4580-9983 PT Time Calculation (min) (ACUTE ONLY): 18 min   Charges:   PT Evaluation $PT Eval Low Complexity: [place] Pager [phone] Office [phone]   Claretha Cooper 03/19/2022, 1:55  PM

## 2022-03-19 NOTE — Progress Notes (Signed)
PROGRESS NOTE    Megan Tate  PYP:[id number] DOB: 25-Jan-1946 DOA: 03/17/2022 PCP: Pcp, No   Brief Narrative: 76 year old female admitted with abdominal pain nausea vomiting found to have acute appendicitis by CT scan she was taken to the OR 03/17/2022 found appendiceal mass with associated appendicitis patient had laparoscopic right hemicolectomy. Her past medical history significant for type 2 diabetes, hypertension, CLL, glaucoma, hyperlipidemia, gout. 03/18/2022-patient denies having any flatus or bowel movements.  Her daughter is by the bedside.  She has not gotten out of bed.  Assessment & Plan:   Principal Problem:   Acute appendicitis Active Problems:   CLL (chronic lymphocytic leukemia) (HCC)   Benign essential hypertension   Type 2 diabetes mellitus without complication, with long-term current use of insulin (HCC)   Hypercalcemia   Hyperlipidemia   #1 acute appendicitis status post laparoscopic right hemicolectomy.  In OR she had appendiceal mass with associated appendicitis. Pathology pending Patient reports she is allergic to Tylenol will DC PT consult pending  #2 history of essential hypertension on amlodipine clonidine hydralazine l and metoprolol  Blood pressure 156/64  #3 type 2 diabetes SSI CBG (last 3)  Recent Labs    03/17/22 1825 03/17/22 2058  GLUCAP 176* 181*   Restart Janumet once she starts taking p.o. intake  #4 hyperlipidemia on statin  #5 CLL follow-up with oncology  #6 glaucoma continue eyedrops  #7 leukocytosis follow-up urine culture currently not on antibiotics    Estimated body mass index is 34.33 kg/m as calculated from the following:   Height as of this encounter: '5\' 4"'$  (1.626 m).   Weight as of this encounter: 90.7 kg.  DVT prophylaxis: Lovenox  code Status: Full code  family Communication: Daughter at bedside  disposition Plan:  Status is: Inpatient Remains inpatient appropriate because: Status post surgery for appendiceal  mass   Consultants:  General surgery  Procedures:  laparoscopic right hemicolectomy Antimicrobials: none Subjective: Resting in bed Very weak No BMs  Objective: Vitals:   03/18/22 1553 03/19/22 0323 03/19/22 0855 03/19/22 0855  BP: (!) 154/58 (!) 142/59 (!) 164/73 (!) 164/73  Pulse: (!) 55 (!) 58 62 (!) 59  Resp:  '17 20 20  '$ Temp:  98.3 F (36.8 C) 98.9 F (37.2 C) 98.9 F (37.2 C)  TempSrc:   Oral Oral  SpO2:  98% 99% 99%  Weight:      Height:        Intake/Output Summary (Last 24 hours) at 03/19/2022 1314 Last data filed at 03/19/2022 0300 Gross per 24 hour  Intake 240 ml  Output 300 ml  Net -60 ml    Filed Weights   03/17/22 0931  Weight: 90.7 kg    Examination:  General exam: Appears calm and comfortable  Respiratory system: Clear to auscultation. Respiratory effort normal. Cardiovascular system: S1 & S2 heard, RRR. No JVD, murmurs, rubs, gallops or clicks. No pedal edema. Gastrointestinal system: Abdomen is nondistended, soft and nontender. No organomegaly or masses felt.  Diminished bowel sounds heard.  Incision with some blood-tinged drainage otherwise clear Central nervous system: Alert and oriented. No focal neurological deficits. Extremities: Symmetric 5 x 5 power. Skin: No rashes, lesions or ulcers Psychiatry: Judgement and insight appear normal. Mood & affect appropriate.     Data Reviewed: I have personally reviewed following labs and imaging studies  CBC: Recent Labs  Lab 03/17/22 0946 03/18/22 0402  WBC 8.5 12.7*  NEUTROABS 4.3  --   HGB 11.8* 12.0  HCT 36.8 38.8  MCV 76.2* 77.6*  PLT 225 559    Basic Metabolic Panel: Recent Labs  Lab 03/17/22 0946 03/18/22 0402  NA 136 136  K 3.6 4.0  CL 104 102  CO2 27 23  GLUCOSE 204* 210*  BUN 12 12  CREATININE 1.20* 1.24*  CALCIUM 10.4* 10.0    GFR: Estimated Creatinine Clearance: 42.8 mL/min (A) (by C-G formula based on SCr of 1.24 mg/dL (H)). Liver Function Tests: Recent Labs   Lab 03/17/22 0946  AST 14*  ALT 10  ALKPHOS 88  BILITOT 0.7  PROT 6.1*  ALBUMIN 3.5    Recent Labs  Lab 03/17/22 0946  LIPASE 26    No results for input(s): "AMMONIA" in the last 168 hours. Coagulation Profile: No results for input(s): "INR", "PROTIME" in the last 168 hours. Cardiac Enzymes: No results for input(s): "CKTOTAL", "CKMB", "CKMBINDEX", "TROPONINI" in the last 168 hours. BNP (last 3 results) No results for input(s): "PROBNP" in the last 8760 hours. HbA1C: No results for input(s): "HGBA1C" in the last 72 hours. CBG: Recent Labs  Lab 03/17/22 1825 03/17/22 2058  GLUCAP 176* 181*    Lipid Profile: No results for input(s): "CHOL", "HDL", "LDLCALC", "TRIG", "CHOLHDL", "LDLDIRECT" in the last 72 hours. Thyroid Function Tests: No results for input(s): "TSH", "T4TOTAL", "FREET4", "T3FREE", "THYROIDAB" in the last 72 hours. Anemia Panel: No results for input(s): "VITAMINB12", "FOLATE", "FERRITIN", "TIBC", "IRON", "RETICCTPCT" in the last 72 hours. Sepsis Labs: No results for input(s): "PROCALCITON", "LATICACIDVEN" in the last 168 hours.  No results found for this or any previous visit (from the past 240 hour(s)).       Radiology Studies: No results found.      Scheduled Meds:  amLODipine  5 mg Oral Daily   cloNIDine  0.2 mg Oral BID   dorzolamide-timolol  1 drop Both Eyes BID   enoxaparin (LOVENOX) injection  40 mg Subcutaneous R41U   folic acid  1 mg Oral Daily   hydrALAZINE  25 mg Oral BID   loratadine  10 mg Oral QHS   metoprolol succinate  25 mg Oral Daily   rosuvastatin  20 mg Oral Daily   terbinafine  250 mg Oral Daily   Continuous Infusions:  lactated ringers 100 mL/hr at 03/18/22 2005     LOS: 2 days    Time spent: 62 min   Georgette Shell, MD  03/19/2022, 1:14 PM

## 2022-03-20 DIAGNOSIS — K3580 Unspecified acute appendicitis: Secondary | ICD-10-CM | POA: Diagnosis not present

## 2022-03-20 LAB — COMPREHENSIVE METABOLIC PANEL
ALT: 10 U/L (ref 0–44)
AST: 18 U/L (ref 15–41)
Albumin: 3 g/dL — ABNORMAL LOW (ref 3.5–5.0)
Alkaline Phosphatase: 56 U/L (ref 38–126)
Anion gap: 7 (ref 5–15)
BUN: 15 mg/dL (ref 8–23)
CO2: 27 mmol/L (ref 22–32)
Calcium: 9.8 mg/dL (ref 8.9–10.3)
Chloride: 102 mmol/L (ref 98–111)
Creatinine, Ser: 1.23 mg/dL — ABNORMAL HIGH (ref 0.44–1.00)
GFR, Estimated: 46 mL/min — ABNORMAL LOW (ref >60)
Glucose, Bld: 264 mg/dL — ABNORMAL HIGH (ref 70–99)
Potassium: 4.9 mmol/L (ref 3.5–5.1)
Sodium: 136 mmol/L (ref 135–145)
Total Bilirubin: 1 mg/dL (ref 0.3–1.2)
Total Protein: 5.9 g/dL — ABNORMAL LOW (ref 6.5–8.1)

## 2022-03-20 LAB — CBC
HCT: 35.3 % — ABNORMAL LOW (ref 36.0–46.0)
Hemoglobin: 11 g/dL — ABNORMAL LOW (ref 12.0–15.0)
MCH: 24.2 pg — ABNORMAL LOW (ref 26.0–34.0)
MCHC: 31.2 g/dL (ref 30.0–36.0)
MCV: 77.6 fL — ABNORMAL LOW (ref 80.0–100.0)
Platelets: 223 10*3/uL (ref 150–400)
RBC: 4.55 MIL/uL (ref 3.87–5.11)
RDW: 13.7 % (ref 11.5–15.5)
WBC: 7 10*3/uL (ref 4.0–10.5)
nRBC: 0 % (ref 0.0–0.2)

## 2022-03-20 LAB — SURGICAL PATHOLOGY

## 2022-03-20 MED ORDER — BISACODYL 10 MG RE SUPP
10.0000 mg | Freq: Once | RECTAL | Status: AC
  Administered 2022-03-20: 10 mg via RECTAL
  Filled 2022-03-20: qty 1

## 2022-03-20 NOTE — Progress Notes (Signed)
3 Days Post-Op   Subjective/Chief Complaint: Tol diet, having flatus, no bm, no n/v   Objective: Vital signs in last 24 hours: Temp:  [98.2 F (36.8 C)-98.8 F (37.1 C)] 98.2 F (36.8 C) (06/20 0522) Pulse Rate:  [62-68] 64 (06/20 0522) Resp:  [18-20] 20 (06/20 0522) BP: (152-163)/(64-73) 152/73 (06/20 0522) SpO2:  [99 %-100 %] 99 % (06/20 0522) Last BM Date : 03/16/22  Intake/Output from previous day: 06/19 0701 - 06/20 0700 In: 600 [I.V.:600] Out: -  Intake/Output this shift: Total I/O In: 240 [P.O.:240] Out: -   General nad Abd: soft, appropriately tender, midline incision with staples and honeycomb dressing in place.  bs present, ND    Lab Results:  Recent Labs    03/18/22 0402  WBC 12.7*  HGB 12.0  HCT 38.8  PLT 218   BMET Recent Labs    03/18/22 0402  NA 136  K 4.0  CL 102  CO2 23  GLUCOSE 210*  BUN 12  CREATININE 1.24*  CALCIUM 10.0   PT/INR No results for input(s): "LABPROT", "INR" in the last 72 hours. ABG No results for input(s): "PHART", "HCO3" in the last 72 hours.  Invalid input(s): "PCO2", "PO2"  Studies/Results: No results found.  Anti-infectives: Anti-infectives (From admission, onward)    Start     Dose/Rate Route Frequency Ordered Stop   03/18/22 1000  terbinafine (LAMISIL) tablet 250 mg        250 mg Oral Daily 03/17/22 2216     03/17/22 1845  cefTRIAXone (ROCEPHIN) 2 g in sodium chloride 0.9 % 100 mL IVPB        2 g 200 mL/hr over 30 Minutes Intravenous  Once 03/17/22 1830 03/17/22 1913   03/17/22 1845  metroNIDAZOLE (FLAGYL) IVPB 500 mg        500 mg 100 mL/hr over 60 Minutes Intravenous  Once 03/17/22 1830 03/17/22 1904   03/17/22 1827  sodium chloride 0.9 % with cefTRIAXone (ROCEPHIN) ADS Med       Note to Pharmacy: Quenten Raven: cabinet override      03/17/22 1827 03/17/22 1918       Assessment/Plan: POD 3 s/p Laparoscopic right hemicolectomy 03/17/22 - Dr. Annye English for right colon/ cecal mass involving the  base of the appendix -Pathology pending -on soft diet, no bm yet but tol diet and having flatus -mobilize, pulm toilet   BMW:UXLK diet, ok to dc fluids from my standpoint VTE: Lovenox ID: none Dispo:possibly home tomorrow if tolerates diet   -- Per TRH --   Hypertension Diabetes CLL  Rolm Bookbinder 03/20/2022

## 2022-03-20 NOTE — Progress Notes (Signed)
PROGRESS NOTE    Megan Tate  FUX:[id number] DOB: 02-14-1946 DOA: 03/17/2022 PCP: Pcp, No   Brief Narrative: 76 year old female admitted with abdominal pain nausea vomiting found to have acute appendicitis by CT scan she was taken to the OR 03/17/2022 found appendiceal mass with associated appendicitis patient had laparoscopic right hemicolectomy. Her past medical history significant for type 2 diabetes, hypertension, CLL, glaucoma, hyperlipidemia, gout. 03/18/2022-patient denies having any flatus or bowel movements.  Her daughter is by the bedside.   03/20/2022 patient is tolerating soft diet,.  No BMs yet encouraged her to ambulate.  Assessment & Plan:   Principal Problem:   Acute appendicitis Active Problems:   CLL (chronic lymphocytic leukemia) (HCC)   Benign essential hypertension   Type 2 diabetes mellitus without complication, with long-term current use of insulin (HCC)   Hypercalcemia   Hyperlipidemia   #1 acute appendicitis status post laparoscopic right hemicolectomy.  In OR she had appendiceal mass with associated appendicitis. Pathology pending Discussed with Dr. Marin Olp he will see patient tomorrow Patient reports she is allergic to Tylenol will DC PT consult no PT follow-up recommended  #2 history of essential hypertension on amlodipine clonidine hydralazine and metoprolol blood pressure 127/67.  #3 type 2 diabetes SSI CBG (last 3)  Recent Labs    03/17/22 1825 03/17/22 2058  GLUCAP 176* 181*   Restart Janumet once she starts taking p.o. intake  #4 hyperlipidemia on statin  #5 CLL follow-up with oncology  #6 glaucoma continue eyedrops  Estimated body mass index is 34.33 kg/m as calculated from the following:   Height as of this encounter: '5\' 4"'$  (1.626 m).   Weight as of this encounter: 90.7 kg.  DVT prophylaxis: Lovenox  code Status: Full code  family Communication: Daughter at bedside  disposition Plan:  Status is: Inpatient Remains inpatient  appropriate because: Status post surgery for appendiceal mass   Consultants:  General surgery  Procedures:  laparoscopic right hemicolectomy Antimicrobials: none Subjective:  Sitting in bed daughter by the bedside no overnight events noted she is tolerating a soft diet having flatus no BMs yet  Objective: Vitals:   03/19/22 1323 03/19/22 2019 03/20/22 0522 03/20/22 1332  BP: (!) 156/64 (!) 163/73 (!) 152/73 127/67  Pulse: 68 62 64 67  Resp: '18 20 20 16  '$ Temp: 98.8 F (37.1 C) 98.6 F (37 C) 98.2 F (36.8 C) 98.2 F (36.8 C)  TempSrc: Oral Oral Oral Oral  SpO2: 100% 100% 99% 100%  Weight:      Height:        Intake/Output Summary (Last 24 hours) at 03/20/2022 1336 Last data filed at 03/20/2022 0900 Gross per 24 hour  Intake 840 ml  Output --  Net 840 ml    Filed Weights   03/17/22 0931  Weight: 90.7 kg    Examination:  General exam: Appears calm and comfortable  Respiratory system: Clear to auscultation. Respiratory effort normal. Cardiovascular system: S1 & S2 heard, RRR. No JVD, murmurs, rubs, gallops or clicks. No pedal edema. Gastrointestinal system: Abdomen is nondistended, soft and nontender. No organomegaly or masses felt.  Diminished bowel sounds heard.  Incision with some blood-tinged drainage otherwise clear Central nervous system: Alert and oriented. No focal neurological deficits. Extremities: Symmetric 5 x 5 power. Skin: No rashes, lesions or ulcers Psychiatry: Judgement and insight appear normal. Mood & affect appropriate.     Data Reviewed: I have personally reviewed following labs and imaging studies  CBC: Recent Labs  Lab 03/17/22 0946 03/18/22  0402  WBC 8.5 12.7*  NEUTROABS 4.3  --   HGB 11.8* 12.0  HCT 36.8 38.8  MCV 76.2* 77.6*  PLT 225 979    Basic Metabolic Panel: Recent Labs  Lab 03/17/22 0946 03/18/22 0402  NA 136 136  K 3.6 4.0  CL 104 102  CO2 27 23  GLUCOSE 204* 210*  BUN 12 12  CREATININE 1.20* 1.24*  CALCIUM  10.4* 10.0    GFR: Estimated Creatinine Clearance: 42.8 mL/min (A) (by C-G formula based on SCr of 1.24 mg/dL (H)). Liver Function Tests: Recent Labs  Lab 03/17/22 0946  AST 14*  ALT 10  ALKPHOS 88  BILITOT 0.7  PROT 6.1*  ALBUMIN 3.5    Recent Labs  Lab 03/17/22 0946  LIPASE 26    No results for input(s): "AMMONIA" in the last 168 hours. Coagulation Profile: No results for input(s): "INR", "PROTIME" in the last 168 hours. Cardiac Enzymes: No results for input(s): "CKTOTAL", "CKMB", "CKMBINDEX", "TROPONINI" in the last 168 hours. BNP (last 3 results) No results for input(s): "PROBNP" in the last 8760 hours. HbA1C: No results for input(s): "HGBA1C" in the last 72 hours. CBG: Recent Labs  Lab 03/17/22 1825 03/17/22 2058  GLUCAP 176* 181*    Lipid Profile: No results for input(s): "CHOL", "HDL", "LDLCALC", "TRIG", "CHOLHDL", "LDLDIRECT" in the last 72 hours. Thyroid Function Tests: No results for input(s): "TSH", "T4TOTAL", "FREET4", "T3FREE", "THYROIDAB" in the last 72 hours. Anemia Panel: No results for input(s): "VITAMINB12", "FOLATE", "FERRITIN", "TIBC", "IRON", "RETICCTPCT" in the last 72 hours. Sepsis Labs: No results for input(s): "PROCALCITON", "LATICACIDVEN" in the last 168 hours.  No results found for this or any previous visit (from the past 240 hour(s)).       Radiology Studies: No results found.      Scheduled Meds:  amLODipine  5 mg Oral Daily   cloNIDine  0.2 mg Oral BID   dorzolamide-timolol  1 drop Both Eyes BID   enoxaparin (LOVENOX) injection  40 mg Subcutaneous G92J   folic acid  1 mg Oral Daily   hydrALAZINE  25 mg Oral BID   loratadine  10 mg Oral QHS   metoprolol succinate  25 mg Oral Daily   rosuvastatin  20 mg Oral Daily   terbinafine  250 mg Oral Daily   Continuous Infusions:  lactated ringers 100 mL/hr at 03/20/22 1012     LOS: 3 days    Time spent: 13 min   Georgette Shell, MD  03/20/2022, 1:36 PM

## 2022-03-20 NOTE — Progress Notes (Signed)
Occupational Therapy Treatment Patient Details Name: Megan Tate MRN: [id number] DOB: 12/17/1945 Today's Date: 03/20/2022   History of present illness Patient is a 76 year old female who presented to the hospital with abdominal pain and nausea. patient was found to have acute appendicitis. patient underwent an appendectomy laproscopic converted to right hemi colectomy. PMH:CLL, glaucoma, hyperlipidemia   OT comments  Patient and husband were educated on using AE for lb dressing. Patient verbalized and demonstrated understanding. Patients husband reported he would look into buying total hip kit. Patient's discharge plan remains appropriate at this time. OT will continue to follow acutely.     Recommendations for follow up therapy are one component of a multi-disciplinary discharge planning process, led by the attending physician.  Recommendations may be updated based on patient status, additional functional criteria and insurance authorization.    Follow Up Recommendations  No OT follow up    Assistance Recommended at Discharge Intermittent Supervision/Assistance  Patient can return home with the following  Assistance with cooking/housework;Assist for transportation;Help with stairs or ramp for entrance;A little help with bathing/dressing/bathroom   Equipment Recommendations  Other (comment) (total hip kit)    Recommendations for Other Services      Precautions / Restrictions Precautions Precautions: None Restrictions Weight Bearing Restrictions: No       Mobility Bed Mobility                    Transfers                         Balance                                           ADL either performed or assessed with clinical judgement   ADL Overall ADL's : Needs assistance/impaired                       Lower Body Dressing Details (indicate cue type and reason): patient and husband were educated on total hip kits. patient and  husband verbalized and demonstrated understanding. patient was able to doff sock with reacher with min cues and don socks with sock aid on edge of bed. patient was able to don simulated pants over feet/ankles with MI sititng on edge of bed.                    Extremity/Trunk Assessment              Vision       Perception     Praxis      Cognition Arousal/Alertness: Awake/alert Behavior During Therapy: WFL for tasks assessed/performed Overall Cognitive Status: Within Functional Limits for tasks assessed                                          Exercises      Shoulder Instructions       General Comments      Pertinent Vitals/ Pain       Pain Assessment Pain Assessment: No/denies pain  Home Living                                            Prior Functioning/Environment              Frequency  Min 2X/week        Progress Toward Goals  OT Goals(current goals can now be found in the care plan section)  Progress towards OT goals: Progressing toward goals     Plan Discharge plan remains appropriate    Co-evaluation                 AM-PAC OT "6 Clicks" Daily Activity     Outcome Measure   Help from another person eating meals?: A Little Help from another person taking care of personal grooming?: A Little Help from another person toileting, which includes using toliet, bedpan, or urinal?: A Little Help from another person bathing (including washing, rinsing, drying)?: A Lot Help from another person to put on and taking off regular upper body clothing?: A Little Help from another person to put on and taking off regular lower body clothing?: A Little 6 Click Score: 17    End of Session Equipment Utilized During Treatment: Other (comment) (total hip kit)  OT Visit Diagnosis: Unsteadiness on feet (R26.81);Muscle weakness (generalized) (M62.81)   Activity Tolerance Patient tolerated treatment well    Patient Left with call bell/phone within reach;with family/visitor present;in bed   Nurse Communication Mobility status        Time: 1338-1354 OT Time Calculation (min): 16 min  Charges: OT Treatments $Self Care/Home Management : 8-22 mins  Molly Fash OTR/L, MS Acute Rehabilitation Department Office# [phone] Pager# [phone]   Molly M Fash 03/20/2022, 3:32 PM 

## 2022-03-20 NOTE — Care Management Important Message (Signed)
Important Message  Patient Details IM Letter given to the Patient. Name: Megan Tate MRN: [id number] Date of Birth: 02-Jul-1946   Medicare Important Message Given:  Yes     Kerin Salen 03/20/2022, 10:58 AM

## 2022-03-21 DIAGNOSIS — C9111 Chronic lymphocytic leukemia of B-cell type in remission: Secondary | ICD-10-CM | POA: Diagnosis not present

## 2022-03-21 DIAGNOSIS — E119 Type 2 diabetes mellitus without complications: Secondary | ICD-10-CM | POA: Diagnosis not present

## 2022-03-21 DIAGNOSIS — I10 Essential (primary) hypertension: Secondary | ICD-10-CM | POA: Diagnosis not present

## 2022-03-21 DIAGNOSIS — C9110 Chronic lymphocytic leukemia of B-cell type not having achieved remission: Secondary | ICD-10-CM

## 2022-03-21 DIAGNOSIS — K3580 Unspecified acute appendicitis: Secondary | ICD-10-CM | POA: Diagnosis not present

## 2022-03-21 LAB — COMPREHENSIVE METABOLIC PANEL
ALT: 10 U/L (ref 0–44)
AST: 13 U/L — ABNORMAL LOW (ref 15–41)
Albumin: 2.9 g/dL — ABNORMAL LOW (ref 3.5–5.0)
Alkaline Phosphatase: 52 U/L (ref 38–126)
Anion gap: 6 (ref 5–15)
BUN: 13 mg/dL (ref 8–23)
CO2: 28 mmol/L (ref 22–32)
Calcium: 9.7 mg/dL (ref 8.9–10.3)
Chloride: 102 mmol/L (ref 98–111)
Creatinine, Ser: 1.25 mg/dL — ABNORMAL HIGH (ref 0.44–1.00)
GFR, Estimated: 45 mL/min — ABNORMAL LOW (ref >60)
Glucose, Bld: 223 mg/dL — ABNORMAL HIGH (ref 70–99)
Potassium: 3.7 mmol/L (ref 3.5–5.1)
Sodium: 136 mmol/L (ref 135–145)
Total Bilirubin: 0.5 mg/dL (ref 0.3–1.2)
Total Protein: 5.5 g/dL — ABNORMAL LOW (ref 6.5–8.1)

## 2022-03-21 LAB — CBC
HCT: 32.3 % — ABNORMAL LOW (ref 36.0–46.0)
Hemoglobin: 10.2 g/dL — ABNORMAL LOW (ref 12.0–15.0)
MCH: 24.2 pg — ABNORMAL LOW (ref 26.0–34.0)
MCHC: 31.6 g/dL (ref 30.0–36.0)
MCV: 76.5 fL — ABNORMAL LOW (ref 80.0–100.0)
Platelets: 210 10*3/uL (ref 150–400)
RBC: 4.22 MIL/uL (ref 3.87–5.11)
RDW: 13.5 % (ref 11.5–15.5)
WBC: 5.6 10*3/uL (ref 4.0–10.5)
nRBC: 0 % (ref 0.0–0.2)

## 2022-03-21 MED ORDER — TRAMADOL HCL 50 MG PO TABS: 50.0000 mg | ORAL_TABLET | Freq: Four times a day (QID) | ORAL | 0 refills | Status: AC | PRN

## 2022-03-21 NOTE — Consult Note (Signed)
Referral MD  Reason for Referral: History of CLL, recent appendectomy for appendicitis.  Chief Complaint  Patient presents with   Abdominal Pain  : I was back in [place] visiting and had abdominal pain.  HPI: Ms. Myint is well-known to me.  She is a very nice 76 year old African-American female.  She has a history of CLL.  We treated her probably 7 years ago with Gazyva/Bendamustine.  She has 6 cycles of treatment.  She then had 2 years of maintenance therapy with Gazyva.  Since then, she has been in remission.  We last saw her a year ago in July.  She had moved down to [place], [place].  She was doing well down there.  She still has a Port-A-Cath and this is being flushed down in [place].  She was up in [place] visiting.  She began to have this abdominal pain.  It seemed to get worse.  She had no vomiting.  She had no diarrhea.  She had no bleeding.  She went to the emergency room.  She had a CT scan done.  This was done on 03/17/2022.  This showed acute appendicitis.  There is no pathologically enlarged lymph nodes.  Her CBC at that time showed a white count of 8.5.  Hemoglobin 11.8.  Platelet count 225,000.  She has 34% lymphocytes.  She was admitted to the hospital.  She underwent laparoscopic surgery on 03/17/2022.  Thankfully, the pathology report ([id number]) showed acute appendicitis.  There is no evidence of malignancy.  She looks quite good.  She is eating.  Hopefully she will be discharged today.  The last time that we saw her, she did have a slightly low IgG level of 130 mg/dL.  Otherwise, she is doing okay.  She really has had no problem with infections.  Her biggest problem has been the diabetes.  Her blood sugars have always been in the 200 range.  She has had no swollen lymph nodes.  There is no cough.  She has had no problems with COVID.  Overall, I would say her performance status is probably ECOG 1.  Past Medical History:  Diagnosis Date   Cancer  ([place]) CLL   CLL (chronic lymphocytic leukemia) ([place]) 08/16/2014   Diabetes mellitus without complication ([place])    Hypertension   :   Past Surgical History:  Procedure Laterality Date   LAPAROSCOPIC APPENDECTOMY N/A 03/17/2022   Procedure: APPENDECTOMY LAPAROSCOPIC CONVERTED TO RIGHT HEMI COLECTOMY;  Surgeon: Ileana Roup, MD;  Location: [facility];  Service: General;  Laterality: N/A;  :   Current Facility-Administered Medications:    amLODipine (NORVASC) tablet 5 mg, 5 mg, Oral, Daily, Ileana Roup, MD, 5 mg at 03/20/22 6010   cloNIDine (CATAPRES) tablet 0.2 mg, 0.2 mg, Oral, BID, Ileana Roup, MD, 0.2 mg at 03/20/22 2139   dorzolamide-timolol (COSOPT) 22.3-6.8 MG/ML ophthalmic solution 1 drop, 1 drop, Both Eyes, BID, Ileana Roup, MD, 1 drop at 03/20/22 2138   enoxaparin (LOVENOX) injection 40 mg, 40 mg, Subcutaneous, Q24H, White, Sharon Mt, MD, 40 mg at 93/23/55 7322   folic acid (FOLVITE) tablet 1 mg, 1 mg, Oral, Daily, Ileana Roup, MD, 1 mg at 03/20/22 0254   hydrALAZINE (APRESOLINE) injection 10 mg, 10 mg, Intravenous, Q2H PRN, Ileana Roup, MD, 10 mg at 03/18/22 0236   hydrALAZINE (APRESOLINE) tablet 25 mg, 25 mg, Oral, BID, Ileana Roup, MD, 25 mg at 03/20/22 2139   HYDROmorphone (DILAUDID) injection 0.5 mg,  0.5 mg, Intravenous, Q4H PRN, Georgette Shell, MD   loratadine (CLARITIN) tablet 10 mg, 10 mg, Oral, QHS, Reubin Milan, MD, 10 mg at 03/20/22 2139   metoprolol succinate (TOPROL-XL) 24 hr tablet 25 mg, 25 mg, Oral, Daily, Ileana Roup, MD, 25 mg at 03/20/22 0908   ondansetron (ZOFRAN-ODT) disintegrating tablet 4 mg, 4 mg, Oral, Q6H PRN **OR** ondansetron (ZOFRAN) injection 4 mg, 4 mg, Intravenous, Q6H PRN, Ileana Roup, MD   rosuvastatin (CRESTOR) tablet 20 mg, 20 mg, Oral, Daily, Ileana Roup, MD, 20 mg at 03/20/22 0908   simethicone (MYLICON) chewable tablet 40 mg, 40 mg, Oral, Q6H  PRN, Ileana Roup, MD, 40 mg at 03/20/22 0058   terbinafine (LAMISIL) tablet 250 mg, 250 mg, Oral, Daily, Ileana Roup, MD, 250 mg at 03/20/22 0908   traMADol (ULTRAM) tablet 50 mg, 50 mg, Oral, Q6H PRN, Ileana Roup, MD, 50 mg at 03/21/22 0046:   amLODipine  5 mg Oral Daily   cloNIDine  0.2 mg Oral BID   dorzolamide-timolol  1 drop Both Eyes BID   enoxaparin (LOVENOX) injection  40 mg Subcutaneous K16W   folic acid  1 mg Oral Daily   hydrALAZINE  25 mg Oral BID   loratadine  10 mg Oral QHS   metoprolol succinate  25 mg Oral Daily   rosuvastatin  20 mg Oral Daily   terbinafine  250 mg Oral Daily  :   Allergies  Allergen Reactions   Tylenol [Acetaminophen] Other (See Comments)    "Messes with my stomach"  :   Family History  Problem Relation Age of Onset   Hypertension Other   :   Social History   Socioeconomic History   Marital status: Single    Spouse name: Not on file   Number of children: Not on file   Years of education: Not on file   Highest education level: Not on file  Occupational History   Not on file  Tobacco Use   Smoking status: Never   Smokeless tobacco: Never   Tobacco comments:    never used tobacco  Vaping Use   Vaping Use: Never used  Substance and Sexual Activity   Alcohol use: Never    Alcohol/week: 0.0 standard drinks of alcohol   Drug use: Never   Sexual activity: Not on file  Other Topics Concern   Not on file  Social History Narrative   Not on file   Social Determinants of Health   Financial Resource Strain: Not on file  Food Insecurity: Not on file  Transportation Needs: Not on file  Physical Activity: Not on file  Stress: Not on file  Social Connections: Not on file  Intimate Partner Violence: Not on file  :  Review of Systems  Constitutional: Negative.  Negative for fever.  HENT: Negative.    Eyes: Negative.   Respiratory: Negative.    Cardiovascular: Negative.   Gastrointestinal:  Positive for  abdominal pain and nausea.  Genitourinary: Negative.   Musculoskeletal: Negative.   Skin: Negative.   Neurological: Negative.   Endo/Heme/Allergies: Negative.   Psychiatric/Behavioral: Negative.       Exam: Patient Vitals for the past 24 hrs:  BP Temp Temp src Pulse Resp SpO2  03/20/22 2105 (!) 197/74 98.5 F (36.9 C) Oral (!) 54 (!) 22 100 %  03/20/22 1332 127/67 98.2 F (36.8 C) Oral 67 16 100 %   Physical Exam Vitals reviewed.  HENT:  Head: Normocephalic and atraumatic.  Eyes:     Pupils: Pupils are equal, round, and reactive to light.  Cardiovascular:     Rate and Rhythm: Normal rate and regular rhythm.     Heart sounds: Normal heart sounds.  Pulmonary:     Effort: Pulmonary effort is normal.     Breath sounds: Normal breath sounds.  Abdominal:     General: Bowel sounds are normal.     Palpations: Abdomen is soft.     Comments: Abdominal exam shows a laparoscopic wounds.  They are healing.  She has no fluid wave.  Bowel sounds are decreased.  There is no guarding or rebound tenderness.  There is no obvious abdominal mass.  There is no palpable splenomegaly.  Musculoskeletal:        General: No tenderness or deformity. Normal range of motion.     Cervical back: Normal range of motion.  Lymphadenopathy:     Cervical: No cervical adenopathy.  Skin:    General: Skin is warm and dry.     Findings: No erythema or rash.  Neurological:     Mental Status: She is alert and oriented to person, place, and time.  Psychiatric:        Behavior: Behavior normal.        Thought Content: Thought content normal.        Judgment: Judgment normal.     Recent Labs    03/20/22 1404 03/21/22 0427  WBC 7.0 5.6  HGB 11.0* 10.2*  HCT 35.3* 32.3*  PLT 223 210    Recent Labs    03/20/22 1404 03/21/22 0427  NA 136 136  K 4.9 3.7  CL 102 102  CO2 27 28  GLUCOSE 264* 223*  BUN 15 13  CREATININE 1.23* 1.25*  CALCIUM 9.8 9.7    Blood smear review: None  Pathology:  See above    Assessment and Plan: Ms. Harten is a very nice 76 year old African-American female.  She has a history of CLL.  I have to believe that she is still in remission.  She has not had any treatment for probably 5 years.  Again, her blood counts look okay.  She does not have a lymphocytosis.  Her CT scan that she had in the emergency room did not show any pathologic adenopathy.  Her IgG level is a little bit low.  This may have contributed to her having the appendicitis.  However, I would not give her back IVIG.  She will be heading back to [place] after she heals up from her surgery.  We will be more than happy to help her out while she is up in [place].  Her Port-A-Cath is not accessed right now.  It was nice to see her again.  Her daughter was with her.  As always, we had good fellowship.  I know my nurses Eusebio Me to see Ms. Jasperson.  She is always so funny.  She really has been a joy to try to help.  Thankfully, I do not see anything that looks malignant that we would have to intervene with.  Lattie Haw, MD  Hebrews 10:35-36

## 2022-03-21 NOTE — Discharge Summary (Signed)
Physician Discharge Summary   Patient: Megan Tate MRN: [id number] DOB: 07/26/1946  Admit date:     03/17/2022  Discharge date: 03/21/22  Discharge Physician: Alma Friendly   PCP: Pcp, No   Recommendations at discharge:  Follow-up with PCP in 1 week Follow-up with general surgery as recommended  Discharge Diagnoses: Principal Problem:   Acute appendicitis Active Problems:   CLL (chronic lymphocytic leukemia) ([place])   Benign essential hypertension   Type 2 diabetes mellitus without complication, with long-term current use of insulin (HCC)   Hypercalcemia   Hyperlipidemia    Hospital Course: 76 year old female admitted with abdominal pain nausea vomiting found to have acute appendicitis by CT scan she was taken to the OR 03/17/2022 found appendiceal mass with associated appendicitis. Patient had laparoscopic right hemicolectomy. Her past medical history significant for type 2 diabetes, hypertension, CLL, glaucoma, hyperlipidemia, gout.   Today, patient denies any new complaints, denies any worsening abdominal pain, chest pain, shortness of breath.  Patient passing gas and had a BM.  Husband and daughter at bedside, discharge plans discussed with both of them.    Assessment and Plan:  Acute appendicitis status post laparoscopic right hemicolectomy on 03/17/22 Noted appendiceal mass with associated appendicitis, pathology with no evidence of malignancy Follow up with general surgery  History of essential hypertension On amlodipine clonidine hydralazine and metoprolol    Type 2 diabetes mellitus, controlled On Janumet, levemir, continue   Hyperlipidemia on statin   CLL follow-up with oncology   Glaucoma continue eyedrops   Pain control - [place] Controlled Substance Reporting System database was reviewed. and patient was instructed, not to drive, operate heavy machinery, perform activities at heights, swimming or participation in water activities or provide  baby-sitting services while on Pain, Sleep and Anxiety Medications; until their outpatient Physician has advised to do so again. Also recommended to not to take more than prescribed Pain, Sleep and Anxiety Medications.    Consultants: General surgery, oncology Procedures performed: R hemicolectomy Disposition: Home Diet recommendation:  Cardiac and Carb modified diet DISCHARGE MEDICATION: Allergies as of 03/21/2022       Reactions   Tylenol [acetaminophen] Other (See Comments)   "Messes with my stomach"        Medication List     STOP taking these medications    baclofen 10 MG tablet Commonly known as: LIORESAL   diclofenac Sodium 1 % Gel Commonly known as: Voltaren   lidocaine 5 % Commonly known as: Lidoderm   meclizine 25 MG tablet Commonly known as: ANTIVERT       TAKE these medications    amLODipine 5 MG tablet Commonly known as: NORVASC TAKE 1 TABLET BY MOUTH EVERY DAY   BD Pen Needle Nano 2nd Gen 32G X 4 MM Misc Generic drug: Insulin Pen Needle Dx DM E11.8 Inject insulin twice daily.   cloNIDine 0.2 MG tablet Commonly known as: CATAPRES Take 1 tablet (0.2 mg total) by mouth 2 (two) times daily.   Dexcom G6 Receiver Devi Use to check glucose as needed.  Diagnosis: E11.65   Dexcom G6 Sensor Misc Use to check glucose as needed.  Replace every 10 days. Dx: E11.65   Dexcom G6 Transmitter Misc Use to check glucose as needed.  Replace every 3 months.  Dx: E11.65   dorzolamide-timolol 22.3-6.8 MG/ML ophthalmic solution Commonly known as: COSOPT Place 1 drop into both eyes in the morning and at bedtime.   folic acid 1 MG tablet Commonly known as: FOLVITE TAKE 2  TABLETS BY MOUTH EVERY DAY What changed: how much to take   glucose blood test strip Commonly known as: ONE TOUCH ULTRA TEST USE TO TEST BLOOD SUGAR BEFORE MEALS AND AT BEDTIME   hydrALAZINE 25 MG tablet Commonly known as: APRESOLINE Take 12.'5mg'$  TID x2 weeks then increast to '25mg'$   TID What changed:  how much to take how to take this when to take this additional instructions   Janumet XR 50-1000 MG Tb24 Generic drug: SitaGLIPtin-MetFORMIN HCl Take 2 tablets by mouth daily. What changed:  how much to take when to take this   Levemir FlexTouch 100 UNIT/ML FlexPen Generic drug: insulin detemir INJECT 25 UNITS INTO THE SKIN DAILY. What changed: See the new instructions.   levocetirizine 5 MG tablet Commonly known as: XYZAL Take 5 mg by mouth at bedtime.   lidocaine-prilocaine cream Commonly known as: EMLA APPLY TO PORT-A-CATH SITE 1 HOUR PRIOR TO TREATMENT What changed: See the new instructions.   Linzess 145 MCG Caps capsule Generic drug: linaclotide TAKE 1 CAPSULE BY MOUTH EVERY DAY BEFORE BREAKFAST What changed: See the new instructions.   losartan 100 MG tablet Commonly known as: COZAAR TAKE 1 TABLET BY MOUTH EVERY DAY What changed: when to take this   magnesium oxide 400 MG tablet Commonly known as: MAG-OX Take 400 mg by mouth every other day.   metoprolol succinate 50 MG 24 hr tablet Commonly known as: TOPROL-XL Take 25 mg by mouth in the morning. Take with or immediately following a meal.   rosuvastatin 20 MG tablet Commonly known as: CRESTOR TAKE 1 TABLET BY MOUTH EVERY DAY   spironolactone 50 MG tablet Commonly known as: ALDACTONE TAKE 1 TABLET BY MOUTH EVERY DAY   terbinafine 250 MG tablet Commonly known as: LAMISIL Take 250 mg by mouth daily.   traMADol 50 MG tablet Commonly known as: ULTRAM Take 1 tablet (50 mg total) by mouth every 6 (six) hours as needed (breakthrough pain).               Durable Medical Equipment  (From admission, onward)           Start     Ordered   03/21/22 0931  For home use only DME Walker rolling  Once       Question Answer Comment  Walker: With 5 Inch Wheels   Patient needs a walker to treat with the following condition Status post surgery      03/21/22 0930             Follow-up Information     Surgery, [place] Follow up on 03/30/2022.   Specialty: General Surgery Why: 10 am. This is a nurse only visit for staple removal., Arrive 30 minutes prior to your appointment time, Please bring your insurance card and photo ID Contact information: [place] [address] [phone]         Ileana Roup, MD Follow up in 3 week(s).   Specialties: General Surgery, Colon and Rectal Surgery Why: We are working arranging this follow-up appointment.  The office will contact you.  Please arrive 30 minutes prior to your appointment for paperwork.  Please bring a copy of your photo ID and insurance card.  If you have not heard back from the office, please check for your appointment date and time when you see [place] for your staple removal on 6/30 Contact information: [place] [id number] [phone]  Discharge Exam: Filed Weights   03/17/22 0931  Weight: 90.7 kg   General: NAD  Cardiovascular: S1, S2 present Respiratory: CTAB Abdomen: Soft, +tender, nondistended, bowel sounds present, surgical scar noted Musculoskeletal: No bilateral pedal edema noted Skin: Normal Psychiatry: Normal mood   Condition at discharge: stable  The results of significant diagnostics from this hospitalization (including imaging, microbiology, ancillary and laboratory) are listed below for reference.   Imaging Studies: CT ABDOMEN PELVIS W CONTRAST  Result Date: 03/17/2022 CLINICAL DATA:  Right lower quadrant pain for 2 days. EXAM: CT ABDOMEN AND PELVIS WITH CONTRAST TECHNIQUE: Multidetector CT imaging of the abdomen and pelvis was performed using the standard protocol following bolus administration of intravenous contrast. RADIATION DOSE REDUCTION: This exam was performed according to the departmental dose-optimization program which includes automated exposure control, adjustment of the mA  and/or kV according to patient size and/or use of iterative reconstruction technique. CONTRAST:  160m OMNIPAQUE IOHEXOL 300 MG/ML  SOLN COMPARISON:  11/02/2019 FINDINGS: Lower Chest: No acute findings. Hepatobiliary: No hepatic masses identified. Gallbladder is unremarkable. No evidence of biliary ductal dilatation. Pancreas:  No mass or inflammatory changes. Spleen: Within normal limits in size and appearance. Adrenals/Urinary Tract: No masses identified. Small left renal cysts noted (no followup imaging recommended). No evidence of ureteral calculi or hydronephrosis. Stomach/Bowel: Findings of acute appendicitis are seen with mild-to-moderate periappendiceal inflammatory changes. Appendix: Location- Standard Diameter-11 mm Appendicolith- Absent Mucosal hyper-enhancement- Present Extraluminal Gas- Absent Periappendiceal Collection- None Vascular/Lymphatic: No pathologically enlarged lymph nodes. No acute vascular findings. Reproductive:  No mass or other significant abnormality. Other:  None. Musculoskeletal:  No suspicious bone lesions identified. IMPRESSION: Positive for acute appendicitis. No evidence of abscess or other complication. Electronically Signed   By: JMarlaine HindM.D.   On: 03/17/2022 11:55    Microbiology: Results for orders placed or performed in visit on 11/23/19  Culture, Urine     Status: Abnormal   Collection Time: 11/23/19  9:57 AM   Specimen: Urine, Clean Catch  Result Value Ref Range Status   Specimen Description   Final    URINE, CLEAN CATCH [facility] [address] [place] [address]   Special Requests   Final    URINE, CLEAN CATCH [facility] [address] [place] [address]   Culture (A)  Final    <10,000 COLONIES/mL INSIGNIFICANT GROWTH Performed at [facility] [address] [place] [address]   Report Status  11/24/2019 FINAL  Final    Labs: CBC: Recent Labs  Lab 03/17/22 0946 03/18/22 0402 03/20/22 1404 03/21/22 0427  WBC 8.5 12.7* 7.0 5.6  NEUTROABS 4.3  --   --   --   HGB 11.8* 12.0 11.0* 10.2*  HCT 36.8 38.8 35.3* 32.3*  MCV 76.2* 77.6* 77.6* 76.5*  PLT 225 218 223 2174  Basic Metabolic Panel: Recent Labs  Lab 03/17/22 0946 03/18/22 0402 03/20/22 1404 03/21/22 0427  NA 136 136 136 136  K 3.6 4.0 4.9 3.7  CL 104 102 102 102  CO2 '27 23 27 28  '$ GLUCOSE 204* 210* 264* 223*  BUN '12 12 15 13  '$ CREATININE 1.20* 1.24* 1.23* 1.25*  CALCIUM 10.4* 10.0 9.8 9.7   Liver Function Tests: Recent Labs  Lab 03/17/22 0946 03/20/22 1404 03/21/22 0427  AST 14* 18 13*  ALT 10 10 10  ALKPHOS 88 56 52  BILITOT 0.7 1.0 0.5  PROT 6.1* 5.9* 5.5*  ALBUMIN 3.5 3.0* 2.9*   CBG: Recent Labs  Lab 03/17/22 1825 03/17/22 2058  GLUCAP 176* 181*    Discharge time spent: greater than 30 minutes.  Signed: Alma Friendly, MD [facility] 03/21/2022

## 2022-03-21 NOTE — Progress Notes (Signed)
PT Cancellation Note  Patient Details Name: Megan Tate MRN: [id number] DOB: 13-Jun-1946   Cancelled Treatment:    Reason Eval/Treat Not Completed: Patient declined, no reason specified. Pt supine in bed with family at bedside upon arrival. Educated pt on therapeutic PT interventions, pt reports she just got back into bed from sitting up in the chair and politely declines PT.    Talbot Grumbling PT, DPT 03/21/22, 10:33 AM

## 2022-03-21 NOTE — Progress Notes (Signed)
4 Days Post-Op  Subjective: CC: Doing well.  Daughter at bedside.  She reports no abdominal pain this morning.  Occasional soreness around her incisions that is well controlled with p.o. medications.  Tolerating diet without nausea or vomiting.  Passing flatus.  Formed BM yesterday.  Voiding.  Mobilizing in halls.  Lives at home with her daughter and husband.  Objective: Vital signs in last 24 hours: Temp:  [98.2 F (36.8 C)-98.5 F (36.9 C)] 98.5 F (36.9 C) (06/20 2105) Pulse Rate:  [54-67] 60 (06/21 0826) Resp:  [16-22] 22 (06/20 2105) BP: (127-197)/(67-74) 197/74 (06/20 2105) SpO2:  [100 %] 100 % (06/20 2105) Last BM Date : 03/20/22  Intake/Output from previous day: 06/20 0701 - 06/21 0700 In: 720 [P.O.:720] Out: -  Intake/Output this shift: No intake/output data recorded.  PE: Gen:  Alert, NAD, pleasant Pulm: Rate and effort normal Abd: Soft, ND, appropriately tender, +BS, midline and laparoscopic incisions with staples in place cdi Psych: A&Ox3   Lab Results:  Recent Labs    03/20/22 1404 03/21/22 0427  WBC 7.0 5.6  HGB 11.0* 10.2*  HCT 35.3* 32.3*  PLT 223 210   BMET Recent Labs    03/20/22 1404 03/21/22 0427  NA 136 136  K 4.9 3.7  CL 102 102  CO2 27 28  GLUCOSE 264* 223*  BUN 15 13  CREATININE 1.23* 1.25*  CALCIUM 9.8 9.7   PT/INR No results for input(s): "LABPROT", "INR" in the last 72 hours. CMP     Component Value Date/Time   NA 136 03/21/2022 0427   NA 142 09/05/2017 0751   K 3.7 03/21/2022 0427   K 3.8 09/05/2017 0751   CL 102 03/21/2022 0427   CL 104 09/05/2017 0751   CO2 28 03/21/2022 0427   CO2 27 09/05/2017 0751   GLUCOSE 223 (H) 03/21/2022 0427   GLUCOSE 170 (H) 09/05/2017 0751   BUN 13 03/21/2022 0427   BUN 11 09/05/2017 0751   CREATININE 1.25 (H) 03/21/2022 0427   CREATININE 1.26 (H) 04/25/2021 0000   CALCIUM 9.7 03/21/2022 0427   CALCIUM 10.8 (H) 09/05/2017 0751   PROT 5.5 (L) 03/21/2022 0427   PROT 5.9 (L)  09/05/2017 0751   ALBUMIN 2.9 (L) 03/21/2022 0427   ALBUMIN 3.5 09/05/2017 0751   AST 13 (L) 03/21/2022 0427   AST 11 (L) 04/06/2021 0950   ALT 10 03/21/2022 0427   ALT 9 04/06/2021 0950   ALT 14 09/05/2017 0751   ALKPHOS 52 03/21/2022 0427   ALKPHOS 88 (H) 09/05/2017 0751   BILITOT 0.5 03/21/2022 0427   BILITOT 0.7 04/06/2021 0950   GFRNONAA 45 (L) 03/21/2022 0427   GFRNONAA 46 (L) 04/06/2021 0950   GFRAA 48 (L) 06/27/2020 0828   Lipase     Component Value Date/Time   LIPASE 26 03/17/2022 0946    Studies/Results: No results found.  Anti-infectives: Anti-infectives (From admission, onward)    Start     Dose/Rate Route Frequency Ordered Stop   03/18/22 1000  terbinafine (LAMISIL) tablet 250 mg        250 mg Oral Daily 03/17/22 2216     03/17/22 1845  cefTRIAXone (ROCEPHIN) 2 g in sodium chloride 0.9 % 100 mL IVPB        2 g 200 mL/hr over 30 Minutes Intravenous  Once 03/17/22 1830 03/17/22 1913   03/17/22 1845  metroNIDAZOLE (FLAGYL) IVPB 500 mg        500 mg 100 mL/hr over  60 Minutes Intravenous  Once 03/17/22 1830 03/17/22 1904   03/17/22 1827  sodium chloride 0.9 % with cefTRIAXone (ROCEPHIN) ADS Med       Note to Pharmacy: Dara Lords M: cabinet override      03/17/22 1827 03/17/22 1918        Assessment/Plan POD 4 s/p Laparoscopic right hemicolectomy 03/17/22 - Dr. Annye English for right colon/ cecal mass involving the base of the appendix - Pathology negative for malignancy. C/w acute suppurative appendicitis with reactive lymphadenitis. I reviewed this with patient and her daughter - Patient is stable for discharge from a general surgery standpoint. Cleared by PT/OT with no f/u rec. Written for DME recs.  We will arrange follow-up for staple removal and with her surgeon.  We will send pain medication to the pharmacy.  I discussed this with [facility] via secure chat. Discussed discharge instructions, restrictions and return/call back precautions with patient   FEN:  HH/CM VTE: Lovenox ID: Periop. None currently.  Dispo: Okay for d/c from our standpoint   -- Per TR[facility] --   Hypertension Diabetes CLL   LOS: 4 days    Jillyn Ledger , [facility] 03/21/2022, 9:26 AM Please see Amion for pager number during day hours 7:00am-4:30pm

## 2022-04-19 ENCOUNTER — Other Ambulatory Visit: Payer: Self-pay | Admitting: Family Medicine

## 2022-04-19 DIAGNOSIS — C9110 Chronic lymphocytic leukemia of B-cell type not having achieved remission: Secondary | ICD-10-CM

## 2022-04-25 DIAGNOSIS — R609 Edema, unspecified: Secondary | ICD-10-CM | POA: Diagnosis not present

## 2022-04-25 DIAGNOSIS — N1831 Chronic kidney disease, stage 3a: Secondary | ICD-10-CM | POA: Diagnosis not present

## 2022-04-25 DIAGNOSIS — I129 Hypertensive chronic kidney disease with stage 1 through stage 4 chronic kidney disease, or unspecified chronic kidney disease: Secondary | ICD-10-CM | POA: Diagnosis not present

## 2022-04-25 DIAGNOSIS — R0602 Shortness of breath: Secondary | ICD-10-CM | POA: Diagnosis not present

## 2022-05-03 ENCOUNTER — Other Ambulatory Visit: Payer: Self-pay | Admitting: Family Medicine

## 2022-05-03 DIAGNOSIS — C9110 Chronic lymphocytic leukemia of B-cell type not having achieved remission: Secondary | ICD-10-CM

## 2022-05-03 DIAGNOSIS — E041 Nontoxic single thyroid nodule: Secondary | ICD-10-CM | POA: Diagnosis not present

## 2022-05-16 ENCOUNTER — Other Ambulatory Visit: Payer: Self-pay | Admitting: Family Medicine

## 2022-05-16 DIAGNOSIS — C9110 Chronic lymphocytic leukemia of B-cell type not having achieved remission: Secondary | ICD-10-CM

## 2022-05-16 DIAGNOSIS — D500 Iron deficiency anemia secondary to blood loss (chronic): Secondary | ICD-10-CM

## 2022-05-19 ENCOUNTER — Other Ambulatory Visit: Payer: Self-pay | Admitting: Family Medicine

## 2022-05-19 DIAGNOSIS — C9110 Chronic lymphocytic leukemia of B-cell type not having achieved remission: Secondary | ICD-10-CM

## 2022-05-22 ENCOUNTER — Other Ambulatory Visit: Payer: Self-pay | Admitting: Family Medicine

## 2022-06-05 DIAGNOSIS — R0602 Shortness of breath: Secondary | ICD-10-CM | POA: Diagnosis not present

## 2022-06-05 DIAGNOSIS — R609 Edema, unspecified: Secondary | ICD-10-CM | POA: Diagnosis not present

## 2022-06-06 DIAGNOSIS — M25512 Pain in left shoulder: Secondary | ICD-10-CM | POA: Diagnosis not present

## 2022-06-06 DIAGNOSIS — N1831 Chronic kidney disease, stage 3a: Secondary | ICD-10-CM | POA: Diagnosis not present

## 2022-06-06 DIAGNOSIS — Z7984 Long term (current) use of oral hypoglycemic drugs: Secondary | ICD-10-CM | POA: Diagnosis not present

## 2022-06-06 DIAGNOSIS — E119 Type 2 diabetes mellitus without complications: Secondary | ICD-10-CM | POA: Diagnosis not present

## 2022-06-06 DIAGNOSIS — R609 Edema, unspecified: Secondary | ICD-10-CM | POA: Diagnosis not present

## 2022-06-06 DIAGNOSIS — L03311 Cellulitis of abdominal wall: Secondary | ICD-10-CM | POA: Diagnosis not present

## 2022-06-06 DIAGNOSIS — I129 Hypertensive chronic kidney disease with stage 1 through stage 4 chronic kidney disease, or unspecified chronic kidney disease: Secondary | ICD-10-CM | POA: Diagnosis not present

## 2022-06-06 DIAGNOSIS — I10 Essential (primary) hypertension: Secondary | ICD-10-CM | POA: Diagnosis not present

## 2022-06-06 DIAGNOSIS — Z794 Long term (current) use of insulin: Secondary | ICD-10-CM | POA: Diagnosis not present

## 2022-06-06 DIAGNOSIS — E785 Hyperlipidemia, unspecified: Secondary | ICD-10-CM | POA: Diagnosis not present

## 2022-06-08 ENCOUNTER — Other Ambulatory Visit: Payer: Self-pay | Admitting: Family Medicine

## 2022-06-08 DIAGNOSIS — C9110 Chronic lymphocytic leukemia of B-cell type not having achieved remission: Secondary | ICD-10-CM

## 2022-06-15 DIAGNOSIS — M25512 Pain in left shoulder: Secondary | ICD-10-CM | POA: Diagnosis not present

## 2022-06-15 DIAGNOSIS — M898X1 Other specified disorders of bone, shoulder: Secondary | ICD-10-CM | POA: Diagnosis not present

## 2022-06-24 ENCOUNTER — Other Ambulatory Visit: Payer: Self-pay | Admitting: Family Medicine

## 2022-06-24 DIAGNOSIS — C9110 Chronic lymphocytic leukemia of B-cell type not having achieved remission: Secondary | ICD-10-CM

## 2022-06-25 ENCOUNTER — Other Ambulatory Visit: Payer: Self-pay | Admitting: Family Medicine

## 2022-06-25 DIAGNOSIS — D500 Iron deficiency anemia secondary to blood loss (chronic): Secondary | ICD-10-CM

## 2022-06-25 DIAGNOSIS — C9110 Chronic lymphocytic leukemia of B-cell type not having achieved remission: Secondary | ICD-10-CM

## 2022-06-27 DIAGNOSIS — C9111 Chronic lymphocytic leukemia of B-cell type in remission: Secondary | ICD-10-CM | POA: Diagnosis not present

## 2022-07-09 DIAGNOSIS — E042 Nontoxic multinodular goiter: Secondary | ICD-10-CM | POA: Diagnosis not present

## 2022-07-09 DIAGNOSIS — K649 Unspecified hemorrhoids: Secondary | ICD-10-CM | POA: Diagnosis not present

## 2022-07-09 DIAGNOSIS — Z8639 Personal history of other endocrine, nutritional and metabolic disease: Secondary | ICD-10-CM | POA: Diagnosis not present

## 2022-07-09 DIAGNOSIS — E211 Secondary hyperparathyroidism, not elsewhere classified: Secondary | ICD-10-CM | POA: Diagnosis not present

## 2022-07-16 DIAGNOSIS — E119 Type 2 diabetes mellitus without complications: Secondary | ICD-10-CM | POA: Diagnosis not present

## 2022-07-16 DIAGNOSIS — H40053 Ocular hypertension, bilateral: Secondary | ICD-10-CM | POA: Diagnosis not present

## 2022-07-16 DIAGNOSIS — H2513 Age-related nuclear cataract, bilateral: Secondary | ICD-10-CM | POA: Diagnosis not present

## 2022-07-16 DIAGNOSIS — H527 Unspecified disorder of refraction: Secondary | ICD-10-CM | POA: Diagnosis not present

## 2022-07-16 DIAGNOSIS — H04123 Dry eye syndrome of bilateral lacrimal glands: Secondary | ICD-10-CM | POA: Diagnosis not present

## 2022-08-02 ENCOUNTER — Other Ambulatory Visit: Payer: Self-pay | Admitting: Family Medicine

## 2022-08-02 DIAGNOSIS — C9110 Chronic lymphocytic leukemia of B-cell type not having achieved remission: Secondary | ICD-10-CM

## 2022-08-22 DIAGNOSIS — D569 Thalassemia, unspecified: Secondary | ICD-10-CM | POA: Diagnosis not present

## 2022-09-10 ENCOUNTER — Other Ambulatory Visit: Payer: Self-pay | Admitting: Family Medicine

## 2022-09-10 DIAGNOSIS — C9110 Chronic lymphocytic leukemia of B-cell type not having achieved remission: Secondary | ICD-10-CM

## 2022-09-20 DIAGNOSIS — H524 Presbyopia: Secondary | ICD-10-CM | POA: Diagnosis not present

## 2022-09-20 DIAGNOSIS — H04123 Dry eye syndrome of bilateral lacrimal glands: Secondary | ICD-10-CM | POA: Diagnosis not present

## 2022-09-20 DIAGNOSIS — E113293 Type 2 diabetes mellitus with mild nonproliferative diabetic retinopathy without macular edema, bilateral: Secondary | ICD-10-CM | POA: Diagnosis not present

## 2022-09-20 DIAGNOSIS — Z83511 Family history of glaucoma: Secondary | ICD-10-CM | POA: Diagnosis not present

## 2022-09-20 DIAGNOSIS — H52203 Unspecified astigmatism, bilateral: Secondary | ICD-10-CM | POA: Diagnosis not present

## 2022-09-20 DIAGNOSIS — H43393 Other vitreous opacities, bilateral: Secondary | ICD-10-CM | POA: Diagnosis not present

## 2022-09-20 DIAGNOSIS — H43821 Vitreomacular adhesion, right eye: Secondary | ICD-10-CM | POA: Diagnosis not present

## 2022-09-20 DIAGNOSIS — H40053 Ocular hypertension, bilateral: Secondary | ICD-10-CM | POA: Diagnosis not present

## 2022-09-20 DIAGNOSIS — Z794 Long term (current) use of insulin: Secondary | ICD-10-CM | POA: Diagnosis not present

## 2022-09-20 DIAGNOSIS — H2513 Age-related nuclear cataract, bilateral: Secondary | ICD-10-CM | POA: Diagnosis not present

## 2022-09-26 ENCOUNTER — Encounter: Payer: Self-pay | Admitting: Family

## 2022-10-02 ENCOUNTER — Other Ambulatory Visit: Payer: Self-pay | Admitting: Family Medicine

## 2022-10-02 DIAGNOSIS — C9110 Chronic lymphocytic leukemia of B-cell type not having achieved remission: Secondary | ICD-10-CM

## 2022-10-09 DIAGNOSIS — Z9049 Acquired absence of other specified parts of digestive tract: Secondary | ICD-10-CM | POA: Diagnosis not present

## 2022-10-09 DIAGNOSIS — L7682 Other postprocedural complications of skin and subcutaneous tissue: Secondary | ICD-10-CM | POA: Diagnosis not present

## 2022-10-12 ENCOUNTER — Encounter: Payer: Self-pay | Admitting: Family

## 2022-10-12 ENCOUNTER — Other Ambulatory Visit: Payer: Self-pay | Admitting: Surgery

## 2022-10-12 DIAGNOSIS — S31109A Unspecified open wound of abdominal wall, unspecified quadrant without penetration into peritoneal cavity, initial encounter: Secondary | ICD-10-CM

## 2022-10-16 ENCOUNTER — Other Ambulatory Visit: Payer: Self-pay | Admitting: Surgery

## 2022-10-16 ENCOUNTER — Ambulatory Visit
Admission: RE | Admit: 2022-10-16 | Discharge: 2022-10-16 | Disposition: A | Discharge status: Home / Self Care | Payer: Medicare Other | Source: Ambulatory Visit | Attending: Surgery | Admitting: Surgery

## 2022-10-16 DIAGNOSIS — Z9049 Acquired absence of other specified parts of digestive tract: Secondary | ICD-10-CM | POA: Diagnosis not present

## 2022-10-16 DIAGNOSIS — C9110 Chronic lymphocytic leukemia of B-cell type not having achieved remission: Secondary | ICD-10-CM | POA: Diagnosis not present

## 2022-10-16 DIAGNOSIS — I700 Atherosclerosis of aorta: Secondary | ICD-10-CM | POA: Diagnosis not present

## 2022-10-16 DIAGNOSIS — S31109A Unspecified open wound of abdominal wall, unspecified quadrant without penetration into peritoneal cavity, initial encounter: Secondary | ICD-10-CM

## 2022-10-19 ENCOUNTER — Telehealth: Payer: Self-pay | Admitting: *Deleted

## 2022-10-19 ENCOUNTER — Inpatient Hospital Stay: Payer: Medicare Other | Attending: Hematology & Oncology

## 2022-10-19 DIAGNOSIS — C9111 Chronic lymphocytic leukemia of B-cell type in remission: Secondary | ICD-10-CM | POA: Diagnosis present

## 2022-10-19 DIAGNOSIS — Z452 Encounter for adjustment and management of vascular access device: Secondary | ICD-10-CM | POA: Diagnosis not present

## 2022-10-19 DIAGNOSIS — Z95828 Presence of other vascular implants and grafts: Secondary | ICD-10-CM

## 2022-10-19 MED ORDER — HEPARIN SOD (PORK) LOCK FLUSH 100 UNIT/ML IV SOLN
500.0000 [IU] | Freq: Once | INTRAVENOUS | Status: AC
  Administered 2022-10-19: 500 [IU] via INTRAVENOUS

## 2022-10-19 MED ORDER — SODIUM CHLORIDE 0.9% FLUSH
10.0000 mL | Freq: Once | INTRAVENOUS | Status: AC
  Administered 2022-10-19: 10 mL via INTRAVENOUS

## 2022-10-19 NOTE — Patient Instructions (Signed)

## 2022-10-19 NOTE — Telephone Encounter (Signed)
Per scheduling message [name] - called and lvm of upcoming appointments - requested call back to confirm 

## 2022-10-22 ENCOUNTER — Inpatient Hospital Stay: Payer: Medicare Other

## 2022-11-20 NOTE — Progress Notes (Signed)
[facility] Quality Team Note  Name: Megan Tate Date of Birth: Apr 22, 1946 MRN: [id number] Date: 11/20/2022  [facility] Quality Team has reviewed this patient's chart, please see recommendations below:  [facility] Quality Other; ([place] BLOOD PRESSURE BELOW 140/90. PATIENT HAS UPCOMING APPT WITH [facility] [place] 11/30/2022. [facility] UNSUCCESSFUL OUTREACH TO VERIFY PCP AND SCHEDULE BP CHECK.)

## 2022-11-30 ENCOUNTER — Other Ambulatory Visit: Payer: Medicare Other

## 2022-11-30 ENCOUNTER — Ambulatory Visit: Payer: Medicare Other | Admitting: Hematology & Oncology

## 2022-12-24 ENCOUNTER — Other Ambulatory Visit: Payer: Self-pay | Admitting: Family Medicine

## 2022-12-24 DIAGNOSIS — C9110 Chronic lymphocytic leukemia of B-cell type not having achieved remission: Secondary | ICD-10-CM

## 2023-04-11 ENCOUNTER — Emergency Department (HOSPITAL_BASED_OUTPATIENT_CLINIC_OR_DEPARTMENT_OTHER): Payer: Medicare Other

## 2023-04-11 ENCOUNTER — Emergency Department (HOSPITAL_BASED_OUTPATIENT_CLINIC_OR_DEPARTMENT_OTHER)
Admission: EM | Admit: 2023-04-11 | Discharge: 2023-04-11 | Disposition: A | Discharge status: Home / Self Care | Payer: Medicare Other | Attending: Emergency Medicine | Admitting: Emergency Medicine

## 2023-04-11 ENCOUNTER — Encounter: Payer: Self-pay | Admitting: Family

## 2023-04-11 ENCOUNTER — Other Ambulatory Visit: Payer: Self-pay

## 2023-04-11 DIAGNOSIS — R059 Cough, unspecified: Secondary | ICD-10-CM | POA: Diagnosis present

## 2023-04-11 DIAGNOSIS — Z7984 Long term (current) use of oral hypoglycemic drugs: Secondary | ICD-10-CM | POA: Insufficient documentation

## 2023-04-11 DIAGNOSIS — Z79899 Other long term (current) drug therapy: Secondary | ICD-10-CM | POA: Insufficient documentation

## 2023-04-11 DIAGNOSIS — U071 COVID-19: Secondary | ICD-10-CM | POA: Diagnosis not present

## 2023-04-11 DIAGNOSIS — Z859 Personal history of malignant neoplasm, unspecified: Secondary | ICD-10-CM | POA: Insufficient documentation

## 2023-04-11 DIAGNOSIS — E119 Type 2 diabetes mellitus without complications: Secondary | ICD-10-CM | POA: Diagnosis not present

## 2023-04-11 DIAGNOSIS — I10 Essential (primary) hypertension: Secondary | ICD-10-CM | POA: Insufficient documentation

## 2023-04-11 DIAGNOSIS — Z794 Long term (current) use of insulin: Secondary | ICD-10-CM | POA: Insufficient documentation

## 2023-04-11 LAB — RESP PANEL BY RT-PCR (RSV, FLU A&B, COVID)  RVPGX2
Influenza A by PCR: NEGATIVE
Influenza B by PCR: NEGATIVE
Resp Syncytial Virus by PCR: NEGATIVE
SARS Coronavirus 2 by RT PCR: POSITIVE — AB

## 2023-04-11 MED ORDER — ALBUTEROL SULFATE HFA 108 (90 BASE) MCG/ACT IN AERS: 1.0000 | INHALATION_SPRAY | RESPIRATORY_TRACT | 0 refills | Status: AC | PRN

## 2023-04-11 MED ORDER — FLUTICASONE PROPIONATE 50 MCG/ACT NA SUSP: 1.0000 | Freq: Two times a day (BID) | NASAL | 0 refills | Status: AC

## 2023-04-11 MED ORDER — GUAIFENESIN-DM 100-10 MG/5ML PO SYRP: 5.0000 mL | ORAL_SOLUTION | ORAL | 0 refills | Status: AC | PRN

## 2023-04-11 MED ORDER — PAXLOVID (150/100) 10 X 150 MG & 10 X 100MG PO TBPK: 2.0000 | ORAL_TABLET | Freq: Two times a day (BID) | ORAL | 0 refills | Status: AC

## 2023-04-11 NOTE — ED Provider Notes (Signed)
[place] [facility] Provider Note  CSN: [id number] Arrival date & time: 04/11/23 0751  Chief Complaint(s) Cough  HPI Megan Tate is a 77 y.o. female with past medical history as below, significant for leukemia/CLL, DM, HTN, obesity, IDA who presents to the ED with complaint of cough, malaise.  Symptom onset 3 days ago, malaise, nonproductive cough, dyspnea.  Fevers or chills, no nausea or vomiting, no diarrhea.  No change urination.  No chest pain or chest tightness.  No body aches.  No neck pain or difficulty moving her neck.  No headaches.  No vision changes.  No sick contacts or recent travel  History of CLL, in remission  Lives in [place], here visiting daughter  Past Medical History Past Medical History:  Diagnosis Date   Cancer (HCC) CLL   CLL (chronic lymphocytic leukemia) (HCC) 08/16/2014   Diabetes mellitus without complication (HCC)    Hypertension    Patient Active Problem List   Diagnosis Date Noted   Acute appendicitis 03/17/2022   Hyperlipidemia 03/17/2022   Thyroid enlargement 04/25/2021   Neck pain 03/02/2021   Back pain 07/13/2020   Other fatigue 10/20/2019   Dysuria 06/03/2019   Post-menopausal 05/18/2019   Gait instability 07/15/2018   Seasonal allergic rhinitis 07/15/2018   IDA (iron deficiency anemia) 05/13/2018   Benign essential hypertension 02/04/2016   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 Authorizing Provider  albuterol (VENTOLIN HFA) 108 (90 Base) MCG/ACT inhaler Inhale 1-2 puffs into the lungs every 4 (four) hours as needed for wheezing or  shortness of breath. 04/11/23  Yes Tanda Rockers A, DO  amLODipine (NORVASC) 5 MG tablet TAKE 1 TABLET BY MOUTH EVERY DAY Patient taking differently: Take 5 mg by mouth daily. 03/27/21   Everrett Coombe, DO  cloNIDine (CATAPRES) 0.2 MG tablet Take 1 tablet (0.2 mg total) by mouth 2 (two) times daily. 06/12/21   Everrett Coombe, DO  Continuous Blood Gluc Receiver (DEXCOM G6 RECEIVER) DEVI Use to check glucose as needed.  Diagnosis: E11.65 02/15/21   Everrett Coombe, DO  Continuous Blood Gluc Sensor (DEXCOM G6 SENSOR) MISC Use to check glucose as needed.  Replace every 10 days. Dx: E11.65 Patient not taking: Reported on 03/17/2022 02/15/21   Everrett Coombe, DO  Continuous Blood Gluc Transmit (DEXCOM G6 TRANSMITTER) MISC Use to check glucose as needed.  Replace every 3 months.  Dx: E11.65 02/15/21   Everrett Coombe, DO  dorzolamide-timolol (COSOPT) 22.3-6.8 MG/ML ophthalmic solution Place 1 drop into both eyes in the morning and at bedtime. 03/18/14   [provider]  fluticasone (FLONASE) 50 MCG/ACT nasal spray Place 1 spray into both nostrils 2 (two) times daily for 7 days. 04/11/23 04/18/23 Yes Sloan Leiter, DO  folic acid (FOLVITE) 1 MG tablet TAKE 2 TABLETS BY MOUTH EVERY DAY Patient taking differently: Take 1 mg by mouth daily. 12/27/20   Josph Macho, MD  glucose blood (ONE TOUCH ULTRA TEST) test strip USE TO TEST BLOOD SUGAR BEFORE MEALS AND AT BEDTIME 09/30/17   Ennever, Rose Phi, MD  guaiFENesin-dextromethorphan (ROBITUSSIN DM) 100-10 MG/5ML syrup Take 5 mLs by mouth every 4 (four) hours  as needed for cough. 04/11/23  Yes Tanda Rockers A, DO  hydrALAZINE (APRESOLINE) 25 MG tablet Take 12.5mg  TID x2 weeks then increast to 25mg  TID Patient taking differently: Take 25 mg by mouth 2 (two) times daily. 04/25/21   Everrett Coombe, DO  Insulin Pen Needle (BD PEN NEEDLE NANO 2ND GEN) 32G X 4 MM MISC Dx DM E11.8 Inject insulin twice daily. 03/29/21   Ashley Royalty, Cody, DO  LEVEMIR FLEXTOUCH 100 UNIT/ML  FlexTouch Pen INJECT 25 UNITS INTO THE SKIN DAILY. Patient taking differently: 25 Units daily before breakfast. 08/21/21   Everrett Coombe, DO  levocetirizine (XYZAL) 5 MG tablet Take 5 mg by mouth at bedtime.    [provider]  lidocaine-prilocaine (EMLA) cream APPLY TO PORT-A-CATH SITE 1 HOUR PRIOR TO TREATMENT Patient taking differently: Apply 1 Application topically See admin instructions. Apply to Port-A-Cath site 1 hour prior to treatment 04/24/21   Josph Macho, MD  LINZESS 145 MCG CAPS capsule TAKE 1 CAPSULE BY MOUTH EVERY DAY BEFORE BREAKFAST Patient taking differently: Take 145 mcg by mouth daily before breakfast. 11/27/21   Everrett Coombe, DO  losartan (COZAAR) 100 MG tablet TAKE 1 TABLET BY MOUTH EVERY DAY Patient taking differently: Take 100 mg by mouth in the morning. 03/16/22   Everrett Coombe, DO  magnesium oxide (MAG-OX) 400 MG tablet Take 400 mg by mouth every other day. 01/15/22   [provider]  metoprolol succinate (TOPROL-XL) 50 MG 24 hr tablet Take 25 mg by mouth in the morning. Take with or immediately following a meal.    [provider]  nirmatrelvir & ritonavir (PAXLOVID, 150/100,) 10 x 150 MG & 10 x 100MG  TBPK Take 2 tablets by mouth 2 (two) times daily for 5 days. 04/11/23 04/16/23 Yes Tanda Rockers A, DO  rosuvastatin (CRESTOR) 20 MG tablet TAKE 1 TABLET BY MOUTH EVERY DAY Patient taking differently: Take 20 mg by mouth daily. 06/01/20   Everrett Coombe, DO  SitaGLIPtin-MetFORMIN HCl (JANUMET XR) 50-1000 MG TB24 Take 2 tablets by mouth daily. Patient taking differently: Take 1 tablet by mouth 2 (two) times daily. 11/21/21   Everrett Coombe, DO  spironolactone (ALDACTONE) 50 MG tablet TAKE 1 TABLET BY MOUTH EVERY DAY Patient not taking: Reported on 03/17/2022 11/20/21   Josph Macho, MD  terbinafine (LAMISIL) 250 MG tablet Take 250 mg by mouth daily.    [provider]  traMADol (ULTRAM) 50 MG tablet Take 1 tablet (50 mg total) by mouth  every 6 (six) hours as needed (breakthrough pain). 03/21/22   Maczis, Elmer Sow, PA-C                                                                                                                                    Past Surgical History Past Surgical History:  Procedure Laterality Date   LAPAROSCOPIC APPENDECTOMY N/A 03/17/2022   Procedure: APPENDECTOMY LAPAROSCOPIC CONVERTED TO RIGHT HEMI COLECTOMY;  Surgeon:  Andria Meuse, MD;  Location: [facility];  Service: General;  Laterality: N/A;   Family History Family History  Problem Relation Age of Onset   Hypertension Other     Social History Social History   Tobacco Use   Smoking status: Never   Smokeless tobacco: Never   Tobacco comments:    never used tobacco  Vaping Use   Vaping status: Never Used  Substance Use Topics   Alcohol use: Never    Alcohol/week: 0.0 standard drinks of alcohol   Drug use: Never   Allergies Tylenol [acetaminophen]  Review of Systems Review of Systems  Constitutional:  Negative for chills and fever.  Respiratory:  Positive for cough and shortness of breath. Negative for chest tightness.   Gastrointestinal:  Negative for abdominal pain, nausea and vomiting.  Musculoskeletal:  Negative for neck pain.  All other systems reviewed and are negative.   Physical Exam Vital Signs  I have reviewed the triage vital signs BP (!) 188/92   Pulse 78   Temp 98.7 F (37.1 C) (Oral)   Resp 18   SpO2 98%  Physical Exam Vitals and nursing note reviewed.  Constitutional:      General: She is not in acute distress.    Appearance: Normal appearance.  HENT:     Head: Normocephalic and atraumatic.     Right Ear: External ear normal.     Left Ear: External ear normal.     Nose: Nose normal.     Mouth/Throat:     Mouth: Mucous membranes are moist.  Eyes:     General: No scleral icterus.       Right eye: No discharge.        Left eye: No discharge.  Cardiovascular:     Rate and Rhythm: Normal rate  and regular rhythm.     Pulses: Normal pulses.     Heart sounds: Normal heart sounds.  Pulmonary:     Effort: Pulmonary effort is normal. No respiratory distress.     Breath sounds: Normal breath sounds.  Abdominal:     General: Abdomen is flat.     Palpations: Abdomen is soft.     Tenderness: There is no abdominal tenderness. There is no guarding.  Musculoskeletal:     Cervical back: No rigidity.     Right lower leg: No edema.     Left lower leg: No edema.  Skin:    General: Skin is warm and dry.     Capillary Refill: Capillary refill takes less than 2 seconds.  Neurological:     Mental Status: She is alert and oriented to person, place, and time.     GCS: GCS eye subscore is 4. GCS verbal subscore is 5. GCS motor subscore is 6.  Psychiatric:        Mood and Affect: Mood normal.        Behavior: Behavior normal.     ED Results and Treatments Labs (all labs ordered are listed, but only abnormal results are displayed) Labs Reviewed  RESP PANEL BY RT-PCR (RSV, FLU A&B, COVID)  RVPGX2 - Abnormal; Notable for the following components:      Result Value   SARS Coronavirus 2 by RT PCR POSITIVE (*)    All other components within normal limits  Radiology DG Chest Portable 1 View  Result Date: 04/11/2023 CLINICAL DATA:  cough EXAM: PORTABLE CHEST 1 VIEW COMPARISON:  CXR 02/25/21 FINDINGS: Right-sided chest port in place with the tip at the cavoatrial junction unchanged cardiac and mediastinal contours. No pleural effusion. No pneumothorax. No focal airspace opacity. There are prominent bilateral interstitial opacities that could represent pulmonary congestion or atypical infection. No radiographically apparent displaced rib fractures IMPRESSION: Prominent bilateral interstitial opacities could represent pulmonary congestion or atypical infection. Electronically Signed    By: Lorenza Cambridge M.D.   On: 04/11/2023 08:27    Pertinent labs & imaging results that were available during my care of the patient were reviewed by me and considered in my medical decision making (see MDM for details).  Medications Ordered in ED Medications - No data to display                                                                                                                                   Procedures Procedures  (including critical care time)  Medical Decision Making / ED Course    Medical Decision Making:    Leanny Moeckel is a 77 y.o. female with past medical history as below, significant for leukemia/CLL, DM, HTN, obesity, IDA who presents to the ED with complaint of cough, malaise.. The complaint involves an extensive differential diagnosis and also carries with it a high risk of complications and morbidity.  Serious etiology was considered. Ddx includes but is not limited to: In my evaluation of this patient's dyspnea my DDx includes, but is not limited to, pneumonia, pulmonary embolism, pneumothorax, pulmonary edema, metabolic acidosis, asthma, COPD, cardiac cause, anemia, anxiety, etc.    Complete initial physical exam performed, notably the patient  was no acute distress, sitting upright on stretcher, on room air..    Reviewed and confirmed nursing documentation for past medical history, family history, social history.  Vital signs reviewed.      Patient reports she did not take her morning medications. Here w/ Cough, dyspnea, malaise. X-ray concerning for pneumonia, COVID-19 testing is positive. Pt does not want labs Symptoms likely secondary to COVID-19, multifocal pneumonia.  Started on Paxlovid, renally dosed.  Supportive care at home.  Return and isolation precautions were emphasized to pt Spoke with spouse on phone with pt She has no hypoxia, she is tolerating p.o. intake without difficulty, no vomiting.  Does not meet criteria for admission at this  time.     The patient improved significantly and was discharged in stable condition. Detailed discussions were had with the patient regarding current findings, and need for close f/u with PCP or on call doctor. The patient has been instructed to return immediately if the symptoms worsen in any way for re-evaluation. Patient verbalized understanding and is in agreement with current care plan. All questions answered prior to discharge.          Additional history obtained: -Additional history  obtained from na -External records from outside source obtained and reviewed including: Chart review including previous notes, labs, imaging, consultation notes including home medications, primary care recommendation   Lab Tests: -I ordered, reviewed, and interpreted labs.   The pertinent results include:   Labs Reviewed  RESP PANEL BY RT-PCR (RSV, FLU A&B, COVID)  RVPGX2 - Abnormal; Notable for the following components:      Result Value   SARS Coronavirus 2 by RT PCR POSITIVE (*)    All other components within normal limits    Notable for covid +tive  EKG   EKG Interpretation Date/Time:    Ventricular Rate:    PR Interval:    QRS Duration:    QT Interval:    QTC Calculation:   R Axis:      Text Interpretation:           Imaging Studies ordered: I ordered imaging studies including cxr I independently visualized the following imaging with scope of interpretation limited to determining acute life threatening conditions related to emergency care; findings noted above, significant for pna I independently visualized and interpreted imaging. I agree with the radiologist interpretation   Medicines ordered and prescription drug management: Meds ordered this encounter  Medications   albuterol (VENTOLIN HFA) 108 (90 Base) MCG/ACT inhaler    Sig: Inhale 1-2 puffs into the lungs every 4 (four) hours as needed for wheezing or shortness of breath.    Dispense:  1 each    Refill:  0    guaiFENesin-dextromethorphan (ROBITUSSIN DM) 100-10 MG/5ML syrup    Sig: Take 5 mLs by mouth every 4 (four) hours as needed for cough.    Dispense:  118 mL    Refill:  0   nirmatrelvir & ritonavir (PAXLOVID, 150/100,) 10 x 150 MG & 10 x 100MG  TBPK    Sig: Take 2 tablets by mouth 2 (two) times daily for 5 days.    Dispense:  20 tablet    Refill:  0   fluticasone (FLONASE) 50 MCG/ACT nasal spray    Sig: Place 1 spray into both nostrils 2 (two) times daily for 7 days.    Dispense:  15.8 mL    Refill:  0    -I have reviewed the patients home medicines and have made adjustments as needed   Consultations Obtained: na   Cardiac Monitoring: The patient was maintained on a cardiac monitor.  I personally viewed and interpreted the cardiac monitored which showed an underlying rhythm of: NSR Continuous pulse oximetry 99 to 100% interpreted by myself, ambient air  Social Determinants of Health:  Diagnosis or treatment significantly limited by social determinants of health: na   Reevaluation: After the interventions noted above, I reevaluated the patient and found that they have improved  Co morbidities that complicate the patient evaluation  Past Medical History:  Diagnosis Date   Cancer (HCC) CLL   CLL (chronic lymphocytic leukemia) (HCC) 08/16/2014   Diabetes mellitus without complication (HCC)    Hypertension       Dispostion: Disposition decision including need for hospitalization was considered, and patient discharged from emergency department.    Final Clinical Impression(s) / ED Diagnoses Final diagnoses:  COVID-19     This chart was dictated using voice recognition software.  Despite best efforts to proofread,  errors can occur which can change the documentation meaning.    Tanda Rockers A, DO 04/11/23 1121

## 2023-04-11 NOTE — Discharge Instructions (Addendum)
It was a pleasure caring for you today in the emergency department.  You are diagnosed with COVID-19.  Please take medications as prescribed.  Please return for any worsening worrisome symptoms.  Please wear a mask when out in public, wash your hands frequently.  Avoid exposure to people or immunocompromise, infants, elderly.  Follow-up with your PCP  Please return to the emergency department for any worsening or worrisome symptoms.

## 2023-04-11 NOTE — ED Notes (Signed)
Attempted to draw labs and missed stick. Pt refused to be stuck again.

## 2023-04-11 NOTE — ED Triage Notes (Signed)
Pt reports a cough that developed Tuesday that is causing a sore throat.   Denies fever,and taking OTC meds
